# Patient Record
Sex: Male | Born: 1935 | Race: White | Hispanic: No | Marital: Married | Smoking: Former smoker
Health system: Southern US, Community
[De-identification: ages and names within clinical notes are randomized; demographics above are authoritative.]

## PROBLEM LIST (undated history)

## (undated) DIAGNOSIS — G459 Transient cerebral ischemic attack, unspecified: Secondary | ICD-10-CM

## (undated) DIAGNOSIS — E785 Hyperlipidemia, unspecified: Secondary | ICD-10-CM

## (undated) DIAGNOSIS — Z720 Tobacco use: Secondary | ICD-10-CM

## (undated) DIAGNOSIS — I639 Cerebral infarction, unspecified: Secondary | ICD-10-CM

## (undated) DIAGNOSIS — N182 Chronic kidney disease, stage 2 (mild): Secondary | ICD-10-CM

## (undated) DIAGNOSIS — K635 Polyp of colon: Secondary | ICD-10-CM

## (undated) DIAGNOSIS — Z9289 Personal history of other medical treatment: Secondary | ICD-10-CM

## (undated) DIAGNOSIS — J189 Pneumonia, unspecified organism: Secondary | ICD-10-CM

## (undated) DIAGNOSIS — K219 Gastro-esophageal reflux disease without esophagitis: Secondary | ICD-10-CM

## (undated) DIAGNOSIS — I10 Essential (primary) hypertension: Secondary | ICD-10-CM

## (undated) DIAGNOSIS — E538 Deficiency of other specified B group vitamins: Secondary | ICD-10-CM

## (undated) DIAGNOSIS — K649 Unspecified hemorrhoids: Secondary | ICD-10-CM

## (undated) DIAGNOSIS — K222 Esophageal obstruction: Secondary | ICD-10-CM

## (undated) DIAGNOSIS — H3530 Unspecified macular degeneration: Secondary | ICD-10-CM

## (undated) DIAGNOSIS — R911 Solitary pulmonary nodule: Secondary | ICD-10-CM

## (undated) HISTORY — DX: Personal history of other medical treatment: Z92.89

## (undated) HISTORY — DX: Deficiency of other specified B group vitamins: E53.8

## (undated) HISTORY — DX: Unspecified macular degeneration: H35.30

## (undated) HISTORY — DX: Transient cerebral ischemic attack, unspecified: G45.9

## (undated) HISTORY — DX: Esophageal obstruction: K22.2

## (undated) HISTORY — DX: Tobacco use: Z72.0

## (undated) HISTORY — DX: Gastro-esophageal reflux disease without esophagitis: K21.9

## (undated) HISTORY — DX: Pneumonia, unspecified organism: J18.9

## (undated) HISTORY — DX: Hyperlipidemia, unspecified: E78.5

## (undated) HISTORY — DX: Unspecified hemorrhoids: K64.9

## (undated) HISTORY — DX: Essential (primary) hypertension: I10

## (undated) HISTORY — DX: Polyp of colon: K63.5

## (undated) HISTORY — DX: Solitary pulmonary nodule: R91.1

---

## 1991-09-30 DIAGNOSIS — Z9289 Personal history of other medical treatment: Secondary | ICD-10-CM

## 1991-09-30 HISTORY — DX: Personal history of other medical treatment: Z92.89

## 1993-08-29 HISTORY — PX: CYSTECTOMY: SUR359

## 1997-07-10 ENCOUNTER — Encounter: Payer: Self-pay | Admitting: Family Medicine

## 1997-07-10 LAB — CONVERTED CEMR LAB: PSA: 1.6 ng/mL

## 1997-12-13 ENCOUNTER — Inpatient Hospital Stay (HOSPITAL_COMMUNITY): Admission: EM | Admit: 1997-12-13 | Discharge: 1997-12-17 | Payer: Self-pay | Admitting: Emergency Medicine

## 2004-09-02 ENCOUNTER — Ambulatory Visit: Payer: Self-pay | Admitting: Family Medicine

## 2005-06-30 ENCOUNTER — Ambulatory Visit: Payer: Self-pay | Admitting: Family Medicine

## 2005-12-09 ENCOUNTER — Ambulatory Visit: Payer: Self-pay | Admitting: Family Medicine

## 2005-12-23 ENCOUNTER — Ambulatory Visit: Payer: Self-pay | Admitting: Family Medicine

## 2006-04-22 DIAGNOSIS — Z9289 Personal history of other medical treatment: Secondary | ICD-10-CM

## 2006-04-22 HISTORY — DX: Personal history of other medical treatment: Z92.89

## 2006-05-16 ENCOUNTER — Encounter: Admission: RE | Admit: 2006-05-16 | Discharge: 2006-05-16 | Payer: Self-pay | Admitting: Interventional Radiology

## 2006-06-06 ENCOUNTER — Ambulatory Visit: Payer: Self-pay | Admitting: Family Medicine

## 2006-06-08 ENCOUNTER — Encounter: Admission: RE | Admit: 2006-06-08 | Discharge: 2006-06-08 | Payer: Self-pay | Admitting: Orthopedic Surgery

## 2006-06-30 ENCOUNTER — Ambulatory Visit: Payer: Self-pay | Admitting: Family Medicine

## 2006-12-06 ENCOUNTER — Ambulatory Visit: Payer: Self-pay | Admitting: Family Medicine

## 2007-01-05 ENCOUNTER — Telehealth: Payer: Self-pay | Admitting: Family Medicine

## 2008-05-28 ENCOUNTER — Ambulatory Visit: Payer: Self-pay | Admitting: Family Medicine

## 2008-07-04 ENCOUNTER — Ambulatory Visit: Payer: Self-pay | Admitting: Internal Medicine

## 2008-07-11 ENCOUNTER — Encounter: Payer: Self-pay | Admitting: Family Medicine

## 2008-07-14 DIAGNOSIS — R0989 Other specified symptoms and signs involving the circulatory and respiratory systems: Secondary | ICD-10-CM

## 2008-07-14 DIAGNOSIS — I10 Essential (primary) hypertension: Secondary | ICD-10-CM | POA: Insufficient documentation

## 2008-07-14 DIAGNOSIS — E7800 Pure hypercholesterolemia, unspecified: Secondary | ICD-10-CM

## 2008-08-29 DIAGNOSIS — J189 Pneumonia, unspecified organism: Secondary | ICD-10-CM

## 2008-08-29 HISTORY — DX: Pneumonia, unspecified organism: J18.9

## 2009-02-08 ENCOUNTER — Ambulatory Visit: Payer: Self-pay | Admitting: Internal Medicine

## 2009-02-08 ENCOUNTER — Encounter: Payer: Self-pay | Admitting: Internal Medicine

## 2009-02-08 ENCOUNTER — Inpatient Hospital Stay (HOSPITAL_COMMUNITY): Admission: EM | Admit: 2009-02-08 | Discharge: 2009-02-11 | Payer: Self-pay | Admitting: Emergency Medicine

## 2009-02-08 DIAGNOSIS — J189 Pneumonia, unspecified organism: Secondary | ICD-10-CM

## 2009-02-11 ENCOUNTER — Telehealth: Payer: Self-pay | Admitting: Family Medicine

## 2009-02-12 ENCOUNTER — Ambulatory Visit: Payer: Self-pay | Admitting: Family Medicine

## 2009-02-25 ENCOUNTER — Ambulatory Visit: Payer: Self-pay | Admitting: Family Medicine

## 2009-06-18 ENCOUNTER — Ambulatory Visit: Payer: Self-pay | Admitting: Family Medicine

## 2010-04-01 ENCOUNTER — Encounter (INDEPENDENT_AMBULATORY_CARE_PROVIDER_SITE_OTHER): Payer: Self-pay | Admitting: *Deleted

## 2010-04-26 ENCOUNTER — Ambulatory Visit: Payer: Self-pay | Admitting: Family Medicine

## 2010-04-26 DIAGNOSIS — F17200 Nicotine dependence, unspecified, uncomplicated: Secondary | ICD-10-CM | POA: Insufficient documentation

## 2010-04-27 LAB — CONVERTED CEMR LAB
Bilirubin, Direct: 0.1 mg/dL (ref 0.0–0.3)
Calcium: 9.5 mg/dL (ref 8.4–10.5)
Creatinine, Ser: 1 mg/dL (ref 0.4–1.5)
HDL: 44.7 mg/dL (ref >39.00)
LDL Cholesterol: 111 mg/dL — ABNORMAL HIGH (ref 0–99)
Total Bilirubin: 0.4 mg/dL (ref 0.3–1.2)
Total CHOL/HDL Ratio: 4
Triglycerides: 86 mg/dL (ref 0.0–149.0)
VLDL: 17.2 mg/dL (ref 0.0–40.0)

## 2010-07-07 ENCOUNTER — Ambulatory Visit: Payer: Self-pay | Admitting: Family Medicine

## 2010-07-07 DIAGNOSIS — J069 Acute upper respiratory infection, unspecified: Secondary | ICD-10-CM | POA: Insufficient documentation

## 2010-09-28 NOTE — Assessment & Plan Note (Signed)
Summary: SORE THROAT/DLO   Vital Signs:  Patient profile:   77 year old male Height:      65.75 inches Weight:      151.75 pounds BMI:     24.77 Temp:     98.4 degrees F oral Pulse rate:   88 / minute Pulse rhythm:   regular BP sitting:   130 / 70  (left arm) Cuff size:   regular  Vitals Entered By: Delilah Shan CMA Duncan Dull) (July 07, 2010 2:53 PM) CC: ST, cough   History of Present Illness: Skin CA on face to be taked off by Dr. Danella Deis in 12/11.    Cough and ST.  "I felt bad last night."  Cough started yesterday/last night.  H/o PNA 2010.  No ear pain.  + rhinorrhea.  All symptoms started yesterday.  Occ sputum, white.  No fevers documented, but had some chills.  SABA not used anytime recently.  Voice change noted by patient.    We'll resend for [employer] today.   Still with occ feeling of food sticking in throat.  He declines GI referral now but is considering.  Not having consistent symptoms, only episodic.   Allergies: No Known Drug Allergies  Review of Systems       See HPI.  Otherwise negative.    Physical Exam  General:  GEN: nad, alert and oriented HEENT: mucous membranes moist, TM w/o erythema, nasal epithelium injected, OP with cobblestoning, bandaid on nose where skin CA is to be removed. NECK: supple w/o LA CV: rrr. PULM: ctab, no inc wob   Impression & Recommendations:  Problem # 1:  URI (ICD-465.9) RST neg and lungs clear to auscultation bilaterally.  Likely benign viral process prevalent in community recently.  Nontoxic and okay for outpatient follow up.  Pt to call back as desired about GI follow up.  He agreed to consider.  His updated medication list for this problem includes:    Mucinex Dm 30-600 Mg Xr12h-tab (Dextromethorphan-guaifenesin) .Marland Kitchen... As needed cough  Complete Medication List: 1)  Lisinopril 40 Mg Tabs (Lisinopril) .... Take 1/2 by mouth daily 2)  Hydrochlorothiazide 25 Mg Tabs (Hydrochlorothiazide) .... Take one by mouth daily 3)   Simvastatin 20 Mg Tabs (Simvastatin) .... 1/2 tab by mouth at night 4)  Proventil Hfa 108 (90 Base) Mcg/act Aers (Albuterol sulfate) .... Take 2 puffs every 4 hours as needed for wheezing 5)  Amlodipine Besylate 10 Mg Tabs (Amlodipine besylate) .... Take one by mouth daily 6)  Mucinex Dm 30-600 Mg Xr12h-tab (Dextromethorphan-guaifenesin) .... As needed cough  Patient Instructions: 1)  Get plenty of rest, drink lots of clear liquids, and use Tylenol  for fever and comfort. I would use the inhaler if needed for cough. This should gradually get better.  If you have other concerns, let us know.    Orders Added: 1)  Est. Patient Level III [91478]    Current Allergies (reviewed today): No known allergies   Laboratory Results  Date/Time Received: July 07, 2010 3:07 PM   Other Tests  Rapid Strep: negative

## 2010-09-28 NOTE — Letter (Signed)
Summary: Nadara Eaton letter  [place] [facility]  [address] [place], [place] [address]   Phone: [phone]  Fax: [phone]       04/01/2010 MRN: [id number]  [facility] [address]  [place], [place]  [address]  Dear Mr. GEARHART,  [facility] [place], and [facility] announce the retirement of Arta Silence, M.D., from full-time practice at the [facility] office effective February 25, 2010 and his plans of returning part-time.  It is important to Dr. Hetty Ely and to our practice that you understand that [facility] has seven physicians in our office for your health care needs.  We will continue to offer the same exceptional care that you have today.    Dr. Hetty Ely has spoken to many of you about his plans for retirement and returning part-time in the fall.   We will continue to work with you through the transition to schedule appointments for you in the office and meet the high standards that [facility] is committed to.   Again, it is with great pleasure that we share the news that Dr. Hetty Ely will return to [facility] at [facility] in October of 2011 with a reduced schedule.    If you have any questions, or would like to request an appointment with one of our physicians, please call us at [phone] and press the option for Scheduling an appointment.  We take pleasure in providing you with excellent patient care and look forward to seeing you at your next office visit.  Our [facility] Physicians are:  Tillman Abide, M.D. Laurita Quint, M.D. Roxy Manns, M.D. Kerby Nora, M.D. Hannah Beat, M.D. Ruthe Mannan, M.D. We proudly welcomed Raechel Ache, M.D. and Eustaquio Boyden, M.D. to the practice in July/August 2011.  Sincerely,  [facility]
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
## 2010-10-28 ENCOUNTER — Ambulatory Visit (INDEPENDENT_AMBULATORY_CARE_PROVIDER_SITE_OTHER)
Admission: RE | Admit: 2010-10-28 | Discharge: 2010-10-28 | Disposition: A | Discharge status: Home / Self Care | Payer: MEDICARE | Source: Ambulatory Visit | Attending: Family Medicine | Admitting: Family Medicine

## 2010-10-28 ENCOUNTER — Ambulatory Visit (INDEPENDENT_AMBULATORY_CARE_PROVIDER_SITE_OTHER): Payer: MEDICARE | Admitting: Family Medicine

## 2010-10-28 ENCOUNTER — Encounter: Payer: Self-pay | Admitting: Family Medicine

## 2010-10-28 ENCOUNTER — Other Ambulatory Visit: Payer: Self-pay | Admitting: Family Medicine

## 2010-10-28 DIAGNOSIS — E7800 Pure hypercholesterolemia, unspecified: Secondary | ICD-10-CM

## 2010-10-28 DIAGNOSIS — J984 Other disorders of lung: Secondary | ICD-10-CM

## 2010-10-28 DIAGNOSIS — I10 Essential (primary) hypertension: Secondary | ICD-10-CM

## 2010-10-28 DIAGNOSIS — R1319 Other dysphagia: Secondary | ICD-10-CM

## 2010-10-28 DIAGNOSIS — N39498 Other specified urinary incontinence: Secondary | ICD-10-CM | POA: Insufficient documentation

## 2010-10-28 DIAGNOSIS — J069 Acute upper respiratory infection, unspecified: Secondary | ICD-10-CM

## 2010-10-28 DIAGNOSIS — K224 Dyskinesia of esophagus: Secondary | ICD-10-CM | POA: Insufficient documentation
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
## 2010-11-08 ENCOUNTER — Telehealth: Payer: Self-pay | Admitting: Family Medicine

## 2010-11-10 ENCOUNTER — Encounter: Payer: Self-pay | Admitting: Family Medicine

## 2010-11-16 NOTE — Miscellaneous (Signed)
  Clinical Lists Changes  Observations: Added new observation of ZOSTAVAX: Zostavax (11/10/2010 17:11)      Immunization History:  Zostavax History:    Zostavax # 1:  zostavax (11/10/2010)

## 2010-11-16 NOTE — Progress Notes (Signed)
Summary: pt wants zostavax  Phone Note From Pharmacy   Caller: [facility]* Summary of Call: Pt would like to get zostavax, pharmacy is asking for an order to be sent to them.  Pt's insurance will pay for this.            Lowella Petties CMA, AAMA  November 08, 2010 10:49 AM   Follow-up for Phone Call        please send order for zostavax, inject one dose.  thanks. Crawford Givens MD  November 08, 2010 1:49 PM  Done.   Lugene Fuquay CMA Eoin Willden Dull)  November 08, 2010 3:05 PM

## 2010-11-22 ENCOUNTER — Encounter: Payer: Self-pay | Admitting: Family Medicine

## 2010-12-06 LAB — CBC
HCT: 35.2 % — ABNORMAL LOW (ref 39.0–52.0)
HCT: 44.5 % (ref 39.0–52.0)
Hemoglobin: 11.9 g/dL — ABNORMAL LOW (ref 13.0–17.0)
MCHC: 33.3 g/dL (ref 30.0–36.0)
MCV: 83.4 fL (ref 78.0–100.0)
MCV: 83.4 fL (ref 78.0–100.0)
Platelets: 203 10*3/uL (ref 150–400)
Platelets: 245 10*3/uL (ref 150–400)
Platelets: 270 10*3/uL (ref 150–400)
RDW: 14.6 % (ref 11.5–15.5)
RDW: 14.5 % (ref 11.5–15.5)
RDW: 14.7 % (ref 11.5–15.5)
WBC: 14.5 10*3/uL — ABNORMAL HIGH (ref 4.0–10.5)
WBC: 22.7 10*3/uL — ABNORMAL HIGH (ref 4.0–10.5)

## 2010-12-06 LAB — DIFFERENTIAL
Basophils Absolute: 0 10*3/uL (ref 0.0–0.1)
Basophils Relative: 0 % (ref 0–1)
Basophils Relative: 0 % (ref 0–1)
Eosinophils Absolute: 0 10*3/uL (ref 0.0–0.7)
Eosinophils Relative: 0 % (ref 0–5)
Lymphs Abs: 0.7 10*3/uL (ref 0.7–4.0)
Monocytes Absolute: 0.4 10*3/uL (ref 0.1–1.0)
Monocytes Relative: 2 % — ABNORMAL LOW (ref 3–12)
Monocytes Relative: 2 % — ABNORMAL LOW (ref 3–12)
Neutro Abs: 13.4 10*3/uL — ABNORMAL HIGH (ref 1.7–7.7)
Neutrophils Relative %: 94 % — ABNORMAL HIGH (ref 43–77)

## 2010-12-06 LAB — EXPECTORATED SPUTUM ASSESSMENT W GRAM STAIN, RFLX TO RESP C

## 2010-12-06 LAB — BASIC METABOLIC PANEL
BUN: 20 mg/dL (ref 6–23)
BUN: 26 mg/dL — ABNORMAL HIGH (ref 6–23)
Chloride: 93 mEq/L — ABNORMAL LOW (ref 96–112)
Chloride: 94 mEq/L — ABNORMAL LOW (ref 96–112)
Glucose, Bld: 131 mg/dL — ABNORMAL HIGH (ref 70–99)
Glucose, Bld: 95 mg/dL (ref 70–99)
Potassium: 3.6 mEq/L (ref 3.5–5.1)
Potassium: 4.3 mEq/L (ref 3.5–5.1)
Sodium: 130 mEq/L — ABNORMAL LOW (ref 135–145)

## 2010-12-06 LAB — POCT CARDIAC MARKERS
CKMB, poc: <1 ng/mL — ABNORMAL LOW (ref 1.0–8.0)
Myoglobin, poc: 71.6 ng/mL (ref 12–200)
Troponin i, poc: <0.05 ng/mL (ref 0.00–0.09)

## 2010-12-06 LAB — CULTURE, BLOOD (ROUTINE X 2)

## 2010-12-06 LAB — POCT I-STAT 3, ART BLOOD GAS (G3+)
Bicarbonate: 28.7 mEq/L — ABNORMAL HIGH (ref 20.0–24.0)
pCO2 arterial: 47.2 mmHg — ABNORMAL HIGH (ref 35.0–45.0)
pH, Arterial: 7.392 (ref 7.350–7.450)
pO2, Arterial: 167 mmHg — ABNORMAL HIGH (ref 80.0–100.0)

## 2010-12-06 LAB — BRAIN NATRIURETIC PEPTIDE: Pro B Natriuretic peptide (BNP): 178 pg/mL — ABNORMAL HIGH (ref 0.0–100.0)

## 2010-12-06 LAB — LEGIONELLA ANTIGEN, URINE

## 2010-12-06 LAB — CULTURE, RESPIRATORY W GRAM STAIN

## 2010-12-06 LAB — GLUCOSE, CAPILLARY: Glucose-Capillary: 100 mg/dL — ABNORMAL HIGH (ref 70–99)

## 2011-01-11 NOTE — Discharge Summary (Signed)
NAMEEVANN, ERAZO NO.:  [id number]   MEDICAL RECORD NO.:  [id number]          PATIENT TYPE:  INP   LOCATION:  5126                         FACILITY:  [facility]   PHYSICIAN:  Willow Ora, MD           DATE OF BIRTH:  12-14-1924   DATE OF ADMISSION:  02/08/2009  DATE OF DISCHARGE:  02/11/2009                               DISCHARGE SUMMARY   PRIMARY CARE DOCTOR:  Arta Silence, MD   ADMITTING DIAGNOSIS:  Right lower lobe pneumonia.   DISCHARGE DIAGNOSES:  1. Bilateral pneumonia.  2. Chronic obstructive pulmonary disease.  3. A 4-mm left lower lobe nodule.  4. Hypertension.  5. High cholesterol.   BRIEF HISTORY AND PHYSICAL:  Mr. Babler is a pleasant 77 year old  gentleman who presented to the ER with several days history of feeling  weak and ill.  On the day of admission, he had a sudden onset of rigors  and sweats, difficulty with shortness of breath.  At the emergency room,  he was found to have an infiltrate and elevated white count and he was  admitted for further care.   PHYSICAL EXAMINATION:  VITAL SIGNS:  His O2 sat 190% on 2 liters.  He  was afebrile.  His respiratory rate was 25 per minute, pulse 107 per  minute.  LUNGS:  Normal respiratory effort.  He had crackles at the right base  and diminished breath sounds in general.  CARDIOVASCULAR:  Tachycardic without murmur.  ABDOMEN:  Soft, nontender with good bowel sounds.  EXTREMITY:  No clubbing, cyanosis, or edema.   LABORATORY AND X-RAYS:  Initial CBC showed white count of 14.5 with a  hemoglobin of 14.9 and platelets of 270.  At some point during this  admission, the white count was 22.7 but at time of discharge, it dropped  back to 12.5 with a hemoglobin of 12.7 and platelets of 241.  Creatinine  1.1, potassium 4.3, sodium has been a little low to 130, blood sugars  normal, calcium normal.  Sputum culture show few Gram-positive cocci and  Gram-positive rods, but the final culture is pending  at the time of this  discharge.  Strep pneumonia in the urine is pending.  Legionella in the  urine is negative.  Blood cultures are negative so far.  BNP is slightly  elevated to 178.  CT of the chest show right greater than left patchy  lower lobe airspace disease suspicious for pneumonia and COPD type of  changes.  There is also a 4-mm left lower lobe nodule in the left lower  lobe.   HOSPITAL COURSE:  The patient was admitted to the hospital and started  on IV antibiotics, IV fluids, and frequent nebulizations.  His hospital  stay was uneventful.  At the time of discharge, he was feeling better.  Appetite was normal.  His O2 sat on room air varies from 95 to 94.  At  this point, he feels ready to go home and consequently he will be  discharged with the following instructions;  1. Lisinopril 40 mg previously taking one  a day, advised the patient      to take half a day because his blood pressure on the hospital has      been in the low side.  2. Continue with hydrochlorothiazide 25 mg one a day.  3. Continue with simvastatin 20 mg half a day, although the patient      reports poor compliance.  4. Albuterol 2 puffs every 4 hours as needed for wheezing, a      prescription is issue.  5. Zithromax 250 one a day for 2 days.  6. Cefuroxime 500 mg one p.o. b.i.d. for 1 week.  7. Mucinex DM or Robitussin DM over-the-counter as needed for cough.  8. He is advised to see his physician next week and call him if there      is any worsening of his symptoms or high fever.  9. Continue with amlodipine 10 mg once a day.  10.The patient has left lower lobe lung nodule per CT.  I'm not sure      if this a new finding, but nevertheless that needs to be follow up      by his primary care doctor.      Willow Ora, MD  Electronically Signed     JP/MEDQ  D:  02/11/2009  T:  02/11/2009  Job:  [id number]   cc:   Arta Silence, MD

## 2011-03-15 ENCOUNTER — Emergency Department (HOSPITAL_COMMUNITY): Payer: Medicare Other

## 2011-03-15 ENCOUNTER — Inpatient Hospital Stay (HOSPITAL_COMMUNITY)
Admission: EM | Admit: 2011-03-15 | Discharge: 2011-03-17 | DRG: 091 | Disposition: A | Discharge status: Home Health | Payer: Medicare Other | Attending: Family Medicine | Admitting: Family Medicine

## 2011-03-15 ENCOUNTER — Inpatient Hospital Stay (INDEPENDENT_AMBULATORY_CARE_PROVIDER_SITE_OTHER)
Admission: RE | Admit: 2011-03-15 | Discharge: 2011-03-15 | Disposition: A | Discharge status: Home / Self Care | Payer: MEDICARE | Source: Ambulatory Visit | Attending: Family Medicine | Admitting: Family Medicine

## 2011-03-15 ENCOUNTER — Telehealth: Payer: Self-pay | Admitting: *Deleted

## 2011-03-15 DIAGNOSIS — E785 Hyperlipidemia, unspecified: Secondary | ICD-10-CM | POA: Diagnosis present

## 2011-03-15 DIAGNOSIS — Z79899 Other long term (current) drug therapy: Secondary | ICD-10-CM

## 2011-03-15 DIAGNOSIS — E538 Deficiency of other specified B group vitamins: Secondary | ICD-10-CM | POA: Diagnosis present

## 2011-03-15 DIAGNOSIS — J4489 Other specified chronic obstructive pulmonary disease: Secondary | ICD-10-CM | POA: Diagnosis present

## 2011-03-15 DIAGNOSIS — Z7982 Long term (current) use of aspirin: Secondary | ICD-10-CM

## 2011-03-15 DIAGNOSIS — I059 Rheumatic mitral valve disease, unspecified: Secondary | ICD-10-CM | POA: Diagnosis present

## 2011-03-15 DIAGNOSIS — Z87891 Personal history of nicotine dependence: Secondary | ICD-10-CM

## 2011-03-15 DIAGNOSIS — N400 Enlarged prostate without lower urinary tract symptoms: Secondary | ICD-10-CM | POA: Diagnosis present

## 2011-03-15 DIAGNOSIS — R279 Unspecified lack of coordination: Principal | ICD-10-CM | POA: Diagnosis present

## 2011-03-15 DIAGNOSIS — I10 Essential (primary) hypertension: Secondary | ICD-10-CM | POA: Diagnosis present

## 2011-03-15 DIAGNOSIS — G458 Other transient cerebral ischemic attacks and related syndromes: Secondary | ICD-10-CM | POA: Diagnosis present

## 2011-03-15 DIAGNOSIS — I6350 Cerebral infarction due to unspecified occlusion or stenosis of unspecified cerebral artery: Secondary | ICD-10-CM | POA: Diagnosis present

## 2011-03-15 DIAGNOSIS — J449 Chronic obstructive pulmonary disease, unspecified: Secondary | ICD-10-CM | POA: Diagnosis present

## 2011-03-15 LAB — DIFFERENTIAL
Basophils Relative: 0 % (ref 0–1)
Eosinophils Absolute: 0.1 10*3/uL (ref 0.0–0.7)
Eosinophils Relative: 1 % (ref 0–5)
Lymphs Abs: 2.5 10*3/uL (ref 0.7–4.0)
Monocytes Absolute: 0.8 10*3/uL (ref 0.1–1.0)
Monocytes Relative: 7 % (ref 3–12)
Neutrophils Relative %: 69 % (ref 43–77)

## 2011-03-15 LAB — CK TOTAL AND CKMB (NOT AT ARMC): Total CK: 87 U/L (ref 7–232)

## 2011-03-15 LAB — PROTIME-INR: Prothrombin Time: 12.7 seconds (ref 11.6–15.2)

## 2011-03-15 LAB — POCT I-STAT, CHEM 8
BUN: 23 mg/dL (ref 6–23)
BUN: 18 mg/dL (ref 6–23)
Calcium, Ion: 1.15 mmol/L (ref 1.12–1.32)
Calcium, Ion: 1.12 mmol/L (ref 1.12–1.32)
Chloride: 99 mEq/L (ref 96–112)
Glucose, Bld: 95 mg/dL (ref 70–99)
Glucose, Bld: 96 mg/dL (ref 70–99)
TCO2: 30 mmol/L (ref 0–100)
TCO2: 27 mmol/L (ref 0–100)

## 2011-03-15 LAB — CBC
MCH: 27.4 pg (ref 26.0–34.0)
MCHC: 33.2 g/dL (ref 30.0–36.0)
MCV: 82.6 fL (ref 78.0–100.0)
Platelets: 234 10*3/uL (ref 150–400)
RBC: 5.22 MIL/uL (ref 4.22–5.81)
RDW: 14.2 % (ref 11.5–15.5)

## 2011-03-15 LAB — COMPREHENSIVE METABOLIC PANEL
ALT: 14 U/L (ref 0–53)
AST: 17 U/L (ref 0–37)
Alkaline Phosphatase: 94 U/L (ref 39–117)
CO2: 30 mEq/L (ref 19–32)
Calcium: 9.5 mg/dL (ref 8.4–10.5)
Chloride: 97 mEq/L (ref 96–112)
GFR calc Af Amer: >60 mL/min (ref >60)
GFR calc non Af Amer: >60 mL/min (ref >60)
Glucose, Bld: 93 mg/dL (ref 70–99)
Potassium: 3.9 mEq/L (ref 3.5–5.1)
Sodium: 135 mEq/L (ref 135–145)

## 2011-03-15 LAB — GLUCOSE, CAPILLARY: Glucose-Capillary: 103 mg/dL — ABNORMAL HIGH (ref 70–99)

## 2011-03-15 LAB — TROPONIN I: Troponin I: <0.3 ng/mL (ref <0.30)

## 2011-03-15 NOTE — Telephone Encounter (Signed)
Wife says that patient is falling and running into walls, feeling very dizzy and light headed. I advised wife to take him to the ER. She agreed and said that she will be taking him to [facility].

## 2011-03-15 NOTE — Telephone Encounter (Signed)
Noted  

## 2011-03-16 ENCOUNTER — Observation Stay (HOSPITAL_COMMUNITY): Payer: Medicare Other

## 2011-03-16 DIAGNOSIS — I517 Cardiomegaly: Secondary | ICD-10-CM

## 2011-03-16 LAB — CBC
Hemoglobin: 13 g/dL (ref 13.0–17.0)
MCH: 27.3 pg (ref 26.0–34.0)
MCHC: 32.7 g/dL (ref 30.0–36.0)
RDW: 14.3 % (ref 11.5–15.5)

## 2011-03-16 LAB — COMPREHENSIVE METABOLIC PANEL
ALT: 12 U/L (ref 0–53)
Albumin: 3.2 g/dL — ABNORMAL LOW (ref 3.5–5.2)
Alkaline Phosphatase: 82 U/L (ref 39–117)
Potassium: 4.6 mEq/L (ref 3.5–5.1)
Sodium: 137 mEq/L (ref 135–145)
Total Protein: 6.5 g/dL (ref 6.0–8.3)

## 2011-03-16 LAB — GLUCOSE, CAPILLARY
Glucose-Capillary: 90 mg/dL (ref 70–99)
Glucose-Capillary: 115 mg/dL — ABNORMAL HIGH (ref 70–99)
Glucose-Capillary: 112 mg/dL — ABNORMAL HIGH (ref 70–99)

## 2011-03-16 LAB — CARDIAC PANEL(CRET KIN+CKTOT+MB+TROPI)
CK, MB: 3.7 ng/mL (ref 0.3–4.0)
Troponin I: <0.3 ng/mL (ref <0.30)

## 2011-03-16 LAB — PROTIME-INR: Prothrombin Time: 12.6 seconds (ref 11.6–15.2)

## 2011-03-16 LAB — LIPID PANEL
Total CHOL/HDL Ratio: 3.1 RATIO
VLDL: 15 mg/dL (ref 0–40)

## 2011-03-16 MED ORDER — GADOBENATE DIMEGLUMINE 529 MG/ML IV SOLN
15.0000 mL | Freq: Once | INTRAVENOUS | Status: AC
  Administered 2011-03-16: 15 mL via INTRAVENOUS

## 2011-03-16 NOTE — Consult Note (Signed)
NAMEMAYKEL, REITTER NO.:  [id number]  MEDICAL RECORD NO.:  [id number]  LOCATION:  3034                         FACILITY:  [facility]  PHYSICIAN:  Marlan Palau, M.D.  DATE OF BIRTH:  1925-02-21  DATE OF CONSULTATION:  03/15/2011 DATE OF DISCHARGE:                                CONSULTATION   HISTORY OF PRESENT ILLNESS:  [name] is an 77 year old right- handed white male born 1925-06-18, with a history of hypertension and dyslipidemia.  This patient comes to the hospital today for an evaluation of difficulty with walking.  The patient went and took a nap at an 11 a.m. today, feeling fine.  The patient awakened at 3 o'clock p.m. and tried to get out of bed.  The patient fell to the floor, finding that he was unable to walk well.  The patient initially went to urgent medical care, but was sent to the emergency room.  The patient has not reported any true vertigo, nausea, vomiting, numbness, or weakness on the arms or legs, visual disturbances, headache, double vision.  He demonstrated in the emergency room, the patient has a tendency to veer to the right.  The patient has undergone an MRI study of the brain which does not clearly show evidence of an acute stroke. The patient has evidence of small-vessel disease by MRI, however.  The patient is being seen by Neurology for further evaluation.  Walking may be getting somewhat better as the day goes on.  NIH stroke scale score is 1.  The patient is not a TPA candidate secondary to minimal deficit and duration of symptoms.  PAST MEDICAL HISTORY:  Significant for: 1. COPD. 2. Hypertension. 3. Dyslipidemia. 4. History of vertigo in the past. 5. Small-vessel disease by MRI. 6. Pilonidal cyst resection. 7. History of pneumonia in 2010.  MEDICATIONS: 1. Norvasc 10 mg daily. 2. Hydrochlorothiazide 25 mg daily. 3. Lisinopril 20 mg daily. 4. Simvastatin 20 mg daily.  The patient has no known  allergies.  Quit smoking in the past.  Does not drink alcohol.  SOCIAL HISTORY:  The patient is married, lives in a [place] area.  Has 2 children, 1 daughter committed suicide.  The patient works as a Electrical engineer.  FAMILY MEDICAL HISTORY:  Mother died of unknown etiology, father died with heart attack.  The patient has 1 sister who is "old" but otherwise no specific medical issues noted.  The patient has no brothers.  REVIEW OF SYSTEMS:  Notable for no fevers or chills.  The patient denies headache, hearing changes, ringing in the ears, or ear pain.  The patient denies any slurred speech, difficulty swallowing, difficulty with neck pain, shortness of breath, chest pains, abdominal pain, nausea, vomiting, or troubles controlling the bowels or bladder.  The patient denies any blackout episodes, numbness or weakness on the face, arms, or legs.  PHYSICAL EXAMINATION:  VITAL SIGNS:  Blood pressure is 177/67, heart rate 64, respiratory rate 20, temperature afebrile. GENERAL:  This patient is a fairly well-developed elderly white male who is alert and cooperative at the time of examination. HEENT:  Head is atraumatic.  Eyes, pupils are round and reactive to  light. NECK:  Supple.  No carotid bruits noted. RESPIRATORY:  Clear. CARDIOVASCULAR:  Revealed regular rate and rhythm.  No obvious murmurs or rubs noted. EXTREMITIES:  Without significant edema. NEUROLOGIC:  Cranial nerves as above.  Facial symmetry is present.  The patient has good sensation of face to pinprick, soft touch bilaterally. Has good strength of facial muscles and the muscles of head turn and shoulder shrug bilaterally.  Speech is well enunciated, not aphasic. Extraocular movements again are full.  No end-gaze nystagmus is seen. The patient has good strength in all 4.  Good symmetric motor tone is noted throughout.  Sensory testing is intact to pinprick, soft touch, vibratory sensation  throughout.  The patient has good finger-nose- finger, heel-to-shin bilaterally.  Gait was tested, slightly wide-based. The patient tends to fall backwards.  Deep tendon reflexes symmetric, normal toes downgoing bilaterally.  Again, no evidence of extinction is noted.  LABORATORY VALUES:  Notable for white count of 11.2, hemoglobin of 14.3, hematocrit of 41.1, MCV of 82.6, platelets of 234.  INR of 0.93.  Sodium 135, potassium 3.9, chloride of 97, CO2 of 30, glucose of 93, BUN of 18, creatinine 0.87, total bili 0.5, alk phosphatase 94, SGOT of 17, SGPT of 14, total protein 7.4, albumin of 3.8, calcium of 9.5, CK of 87, MB fraction of 3.7, troponin I less than 0.3.  IMPRESSION: 1. Onset of gait instability, sudden onset. 2. Small vessel disease by MRI. 3. Hypertension.  This patient does have some risk factors for stroke.  The patient has had a sudden onset of gait disturbance, unassociated with true vertigo or nausea.  Even though the MRI scan of the brain does not show an infarct, the patient likely has had a small vessel event.  The patient will be placed on aspirin.  We will complete the stroke workup to include MRA of the head and neck, 2D echocardiogram.  The patient will undergo physical therapy evaluation.  I will check some blood work to include TSH, thiamine level, and B12 level.     Marlan Palau, M.D.     CKW/MEDQ  D:  03/16/2011  T:  03/16/2011  Job:  [id number]  cc:   [facility]  Electronically Signed by Thana Farr M.D. on 03/16/2011 08:24:26 AM

## 2011-03-17 LAB — GLUCOSE, CAPILLARY
Glucose-Capillary: 92 mg/dL (ref 70–99)
Glucose-Capillary: 109 mg/dL — ABNORMAL HIGH (ref 70–99)

## 2011-03-17 LAB — CBC
HCT: 40.9 % (ref 39.0–52.0)
MCHC: 33.5 g/dL (ref 30.0–36.0)
RDW: 14.3 % (ref 11.5–15.5)

## 2011-03-17 LAB — COMPREHENSIVE METABOLIC PANEL
Albumin: 3.1 g/dL — ABNORMAL LOW (ref 3.5–5.2)
Alkaline Phosphatase: 91 U/L (ref 39–117)
BUN: 21 mg/dL (ref 6–23)
Calcium: 9 mg/dL (ref 8.4–10.5)
Potassium: 4.1 mEq/L (ref 3.5–5.1)
Sodium: 135 mEq/L (ref 135–145)
Total Protein: 6.5 g/dL (ref 6.0–8.3)

## 2011-03-17 NOTE — Discharge Summary (Signed)
[name], [name] NO.:  [id number]  MEDICAL RECORD NO.:  [id number]  LOCATION:  3034                         FACILITY:  [facility]  PHYSICIAN:  Standley Dakins, MD   DATE OF BIRTH:  05-09-1925  DATE OF ADMISSION:  03/15/2011 DATE OF DISCHARGE:  03/17/2011                              DISCHARGE SUMMARY   PRIMARY CARE PHYSICIAN:  Dwana Curd. Para March, MD at [facility].  NEUROLOGIST:  Pramod P. Pearlean Brownie, MD  DISCHARGE DIAGNOSES: 1. Transient ischemic attack versus small subacute cerebrovascular     accident. 2. Hypertension. 3. Hyperlipidemia. 4. Ataxia - acute - resolved now. 5. Chronic obstructive pulmonary disease. 6. Benign prostatic hypertrophy. 7. History of vertigo. 8. History of pneumonia. 9. Mobile calcification in the aortic arch near the subclavian take     off, PRIMARY CARE PROVIDER to consider CTA for further evaluation. 10.Severe posterior mitral annular calcification. 11.Mild left ventricular hypertrophy and systolic function in the     range of 55%. 12.Vitamin B12 deficiency.  DISCHARGE MEDICATIONS: 1. Aspirin enteric-coated 325 mg 1 p.o. daily. 2. Amlodipine 10 mg 1 p.o. daily. 3. Hydrochlorothiazide 25 mg 1 tablet p.o. daily. 4. Lisinopril 20 mg 1 tablet p.o. daily. 5. Simvastatin 20 mg 1 p.o. daily.  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  This was concerning for possible CVA.  The patient was admitted and seen by Neurology.  He had an MRI that did not show any acute CVA.  The neurologist felt, however, that he probably did have a small subacute CVA that was not seen on MRI.  The patient improved considerably with treatment.  He worked with Physical Therapy.  They recommended that he continue outpatient physical therapy for balance.  His gait improved considerably and he  was able to ambulate the halls and the ataxia improved as well.    The patient had multiple studies including carotid Doppler studies resulting in no evidence of ICA stenosis.  There was note of vertebral artery flow that was antegrade.  The patient had a 2-D echocardiogram that revealed mobile calcification in the aortic arch near the subclavian take off and they wrote to consider CTA to further evaluate.  I am recommending that the patient see his primary care provider and neurologist to discuss if they want any further studies to be done at this time.  The neurologist recommended that the patient start taking aspirin 325 mg daily and we are going to have him to continue taking that.    I am going to have him follow up closely with his primary care physician in about 1 week.  The echocardiogram also revealed that the left ventricle cavity was normal.  A mild LVH pattern was seen and normal systolic function was seen.  The patient had severe posterior annular calcifications of the mitral valve.  The aorta was normal, not dilated, and non-diseased.  He had mildly calcified leaflets in the aortic valve.  Left atrium was normal in size and no defect or patent foramen ovale was identified in the atrial septum.  Please see full report of echocardiogram for further details.  The patient started asking to go home on March 16, 2011.  We monitored him for an additional night and he did very well, ambulated very well, and given that he was anxious about going home and would not stop asking to go home, I decided to discharge him with close followup with his primary care providers and also with his neurologist.  I would like for him to see his PCP in about a week.  DISCHARGE CONDITION:  Stable and symptoms improved.  Ataxia improved and gait improved.  ACTIVITY:  Ad lib.  Continue home physical therapy, we will arrange prior to discharge.  DIET:  Cardiac diet recommended.  Follow up with  Dr. Crawford Givens in 1 week.  At that time, he can discuss with Dr. Para March if they want to pursue a CT angiogram to further evaluate the mobile calcifications seen on the echocardiogram near the subclavian take off.  Also follow up with neurologist Dr. Pearlean Brownie in 1 month.  He can also discuss with Dr. Pearlean Brownie if they want to pursue the CT angiogram.  SPECIAL INSTRUCTIONS: 1. Return if symptoms recur, worsen or new changes develop. 2. Please discuss with your primary care physician the results of     echocardiogram and other studies done in the hospital. 3. Please follow up with neurologist and discuss results of tests done     in the hospital. 4. Please start taking daily enteric-coated aspirin 325 mg once daily. 5. Please continue home physical therapy and cooperate as recommended. 6. FALL precautions recommended.   7. Please discuss with primary care physician regarding vitamin B12     deficiency. 8. The patient verbalized understanding to all instructions.    The patient was evaluated on the day of discharge.  The patient was  in no distress, cooperative, and pleasant.  The patient was evaluated on March 17, 2011.  The patient was awake, alert, asking to go home,  sitting up in the chair.  He had been ambulating the halls without  difficulty.  The ataxia has resolved.  PHYSICAL EXAMINATION:  VITAL SIGNS:  Reviewed.  Temperature 97.7, pulse 59, respirations 20, blood pressure 137/78, pulse ox 95% on room air. GENERAL:  Well-developed, well-nourished male.  He is awake, alert in no distress, cooperative and pleasant. HEENT:  Normocephalic, atraumatic.  Mucous membranes moist. NECK:  Supple.  No lymphadenopathy or JVD. LUNGS:  Bilateral breath sounds, clear to auscultation. NEUROLOGICAL:  No focal deficits of strength, sensation, or gait.  LABORATORY DATA:  Sodium 135, potassium 4.1, chloride 96, bicarb 32, BUN 21, creatinine 0.93, glucose 93, albumin 3.1, calcium 9.0, AST 17,  ALT 12, white blood cell count 9.9, hemoglobin 13.7, hematocrit 40.9, platelet count 239,000.  Hemoglobin A1c 6.2%, vitamin B12 182, TSH 3.016.  RPR nonreactive.  Total cholesterol 163, triglycerides 75, HDL 53, LDL 95.  IMPRESSION:  The patient was evaluated and determined to be stable for discharge home with close followup with primary care physician and neurologist as above.  I spent 43 minutes preparing this patient's discharge including reviewing medical records, consultation notes, reconciling medications, preparing dictations, and arranging followup and consultation with care coordination team.  NOTE TO PRIMARY CARE PHYSICIAN DR. Para March AND NEUROLOGIST DR. Pearlean Brownie:    PLEASE REVIEW PATIENT'S ECHOCARDIOGRAM AND DECIDE IF YOU WANT TO PURSUE  FURTHER TESTING SUCH AS NOTED AS A CONSIDERATION IN ECHO RESULTS FOR  MOBILE CALCIFICATIONS SEEN NEAR AORTIC ARCH.    Clanford  Laural Benes, MD     CJ/MEDQ  D:  03/17/2011  T:  03/17/2011  Job:  [id number]  cc:   Dwana Curd. Para March, M.D. Pramod P. Pearlean Brownie, MD  Electronically Signed by Standley Dakins  on 03/17/2011 06:45:46 PM

## 2011-03-20 ENCOUNTER — Telehealth: Payer: Self-pay | Admitting: Family Medicine

## 2011-03-20 NOTE — Telephone Encounter (Addendum)
Call pt about hospital follow up- find out when he's going to see Neuro and let me know.  Please send this note back to me.  We need to consider CTA of the aortic arch.  I'll talk to pt about it at follow up appointment.

## 2011-03-21 ENCOUNTER — Encounter: Payer: Self-pay | Admitting: Family Medicine

## 2011-03-21 LAB — VITAMIN B1: Vitamin B1 (Thiamine): 18 nmol/L (ref 9–44)

## 2011-03-21 NOTE — Telephone Encounter (Signed)
Patient has an appointment scheduled with Dr. Para March on 03/22/11.

## 2011-03-21 NOTE — Telephone Encounter (Signed)
Noted  

## 2011-03-22 ENCOUNTER — Encounter: Payer: Self-pay | Admitting: Family Medicine

## 2011-03-22 ENCOUNTER — Ambulatory Visit (INDEPENDENT_AMBULATORY_CARE_PROVIDER_SITE_OTHER): Payer: Medicare Other | Admitting: Family Medicine

## 2011-03-22 VITALS — BP 128/64 | HR 72 | Temp 98.4°F | Wt 149.0 lb

## 2011-03-22 DIAGNOSIS — E538 Deficiency of other specified B group vitamins: Secondary | ICD-10-CM

## 2011-03-22 DIAGNOSIS — G459 Transient cerebral ischemic attack, unspecified: Secondary | ICD-10-CM

## 2011-03-22 MED ORDER — CYANOCOBALAMIN 1000 MCG/ML IJ SOLN
1000.0000 ug | Freq: Once | INTRAMUSCULAR | Status: AC
  Administered 2011-03-22: 1000 ug via INTRAMUSCULAR

## 2011-03-22 NOTE — Patient Instructions (Addendum)
I want you to come back for a nurse visit in 1 month to get another B12 shot.  You'll get 1 shot a month.  I'll call the neuro clinic and then notify you.   If you have any symptoms like you did before, then call 911.

## 2011-03-22 NOTE — Progress Notes (Signed)
He had gone to bed, awoke and fell trying to get out of bed. He tried to get up and then fell to the L side.  When he tried to get up, he was staggering down the hall.  He called for his wife and went to ER.  Admitted to [facility] 7/17-7/19 with likely TIA.  He wasn't dizzy, but he would list to the side.  Walking started to get better by leaving the hospital, near baseline now.  Speech is okay, swallowing okay per patient.  "I'm close to 100%, but I quit my job."  Echo reviewed with calcification in aortic arch noted.  MRA/MRI w/o acute changes.  No focal weakness in meantime.    B12 def noted on labs.    PMH and SH reviewed  ROS: See HPI, otherwise noncontributory.  Meds, vitals, and allergies reviewed.   GEN: nad, alert and oriented HEENT: mucous membranes moist NECK: supple w/o LA CV: rrr.  PULM: ctab, no inc wob ABD: soft, +bs EXT: no edema SKIN: no acute rash CN 2-12 wnl B, S/S/DTR wnl x4  Recent labs d/w pt along with imaging.

## 2011-03-23 ENCOUNTER — Telehealth: Payer: Self-pay | Admitting: Family Medicine

## 2011-03-23 ENCOUNTER — Encounter: Payer: Self-pay | Admitting: Family Medicine

## 2011-03-23 DIAGNOSIS — E538 Deficiency of other specified B group vitamins: Secondary | ICD-10-CM | POA: Insufficient documentation

## 2011-03-23 DIAGNOSIS — G459 Transient cerebral ischemic attack, unspecified: Secondary | ICD-10-CM | POA: Insufficient documentation

## 2011-03-23 NOTE — Assessment & Plan Note (Signed)
On ASA, CCB, ACE, HCTZ and statin.  LDL <100, glucose not elevated.  Goal for secondary prevention and I'll d/w pt's case with neuro about aortic arch eval with possible CTA.  Will notify pt at that point.  He agrees with plan.  >45 min spent with face to face with patient, >50% counseling and/or coordinating care.

## 2011-03-23 NOTE — Assessment & Plan Note (Signed)
Not anemic, start replacement today.  D/w pt.

## 2011-03-23 NOTE — Telephone Encounter (Signed)
I talked to Dr. Pearlean Brownie and then I called the patient.  Dr. Pearlean Brownie and I agreed about the following: there wasn't thrombus seen on the echo and getting CTA would likely not change management (we would not directly intervene on the lesion).  Therefore, we will not proceed with CTA.  Continue current meds.  Pt understood, agreed.  He'll f/u with neuro.

## 2011-05-03 ENCOUNTER — Ambulatory Visit (INDEPENDENT_AMBULATORY_CARE_PROVIDER_SITE_OTHER): Payer: Medicare Other | Admitting: *Deleted

## 2011-05-03 DIAGNOSIS — E538 Deficiency of other specified B group vitamins: Secondary | ICD-10-CM

## 2011-05-03 MED ORDER — CYANOCOBALAMIN 1000 MCG/ML IJ SOLN
1000.0000 ug | Freq: Once | INTRAMUSCULAR | Status: AC
  Administered 2011-05-03: 1000 ug via INTRAMUSCULAR

## 2011-06-02 ENCOUNTER — Ambulatory Visit (INDEPENDENT_AMBULATORY_CARE_PROVIDER_SITE_OTHER): Payer: Medicare Other

## 2011-06-02 DIAGNOSIS — E538 Deficiency of other specified B group vitamins: Secondary | ICD-10-CM

## 2011-06-02 MED ORDER — CYANOCOBALAMIN 1000 MCG/ML IJ SOLN
1000.0000 ug | Freq: Once | INTRAMUSCULAR | Status: AC
  Administered 2011-06-02: 1000 ug via INTRAMUSCULAR

## 2011-06-11 NOTE — H&P (Signed)
[name], [name] NO.:  [id number]  MEDICAL RECORD NO.:  [id number]  LOCATION:                                 FACILITY:  PHYSICIAN:  Lonia Blood, M.D.      DATE OF BIRTH:  [birth year]  DATE OF ADMISSION:  03/16/2011 DATE OF DISCHARGE:                             HISTORY & PHYSICAL   PRIMARY CARE PHYSICIAN:  [facility], Dr. Para March.  PRESENTING COMPLAINT:  Unable to walk and ataxia.  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 and could not walk without falling into walls.  He denied any dizziness or vertigo, but has not been able to stand on his feet.  He has been very much ataxic and has been demonstrated it here in the ED. No changes in his speech.  No focal weakness.  The symptoms are constant and gradually improving in the ED, but he is still weak and not able to walk.  He has past medical history that includes hypertension, pneumonia, vertigo, also hyperlipidemia.  A code stroke has not been called for the patient.  This is because he came outside the window for possible stroke.  His past medical history is significant for; 1. Hypertension. 2. COPD. 3. BPH. 4. History of vertigo. 5. History of pneumonia. 6. Hyperlipidemia. 7. Also status post back surgery. 8. Status post cardiac cath in the past. 9. History of a 4 mm left lower lobe nodule.  ALLERGIES:  He has no known drug allergies.  CURRENT MEDICATIONS: 1. Amlodipine 10 mg daily. 2. Hydrochlorothiazide 25 mg daily. 3. Lisinopril 20 mg daily. 4. Simvastatin 20 mg daily.  SOCIAL HISTORY:  The patient lives in [place].  He still works as a Electrical engineer.  He is a former tobacco smoker; he has quit many years ago.  No IV drug use and no alcohol.  Family history is noncontributory.  REVIEW OF  SYSTEMS:  All systems reviewed currently negative except per HPI.  PHYSICAL EXAMINATION:  VITAL SIGNS:  Temperature is 98.4, initial blood pressure 189/84, his pulse 81, respiratory rate 14, sats 95% on room air. GENERAL:  The patient is awake, alert, oriented, very pleasant man.  He is in no acute distress. HEENT:  PERRL.  EOMI.  No significant pallor, no jaundice.  No rhinorrhea. NECK:  Supple.  No JVD, no lymphadenopathy. RESPIRATORY:  He has good air entry bilaterally.  No wheezes, no rales, no crackles. CARDIOVASCULAR:  He has S1, S2.  No audible murmurs. ABDOMEN:  Soft, full, nontender with positive bowel sounds.  His extremities showed no edema, cyanosis, or clubbing. SKIN:  No rashes or ulcers. NEURO:  Cranial nerves II-XII seem to be intact.  Power is 5/5 upper and lower extremities respectively and reflexes 2+.  His coordination is off with what appears to be disorientation, and he has an ataxic gait.  LABORATORY DATA:  White count is 11.2, hemoglobin 14.3 with platelet count of 234 with absolute neutrophil count of 7.8.  Sodium 135, potassium 3.9, chloride 95, CO2 30, glucose 93, BUN 18, creatinine 0.87. LFTs within normal.  Albumin 3.8.  Initial cardiac enzymes are negative. Head CT without contrast is negative.  His MRI is done and this apparently shows no acute findings at this point.  ASSESSMENT:  This is an 77 year old gentleman presenting with symptoms and exam findings of possible brainstem or posterior circulation stroke, although the MRI so far has been negative.  His ataxia is more than likely a cerebrovascular accident based on the sudden nature and the fact that the patient has risk factors for cerebrovascular accident anyway.  Plan therefore would be; 1. Ataxia.  The patient will be admitted for possible cerebrovascular     accident.  We are getting neurology involvement at the moment.     Once he passes swallow evaluation, we will put him on aspirin.      PT/OT will do workup including checking his B12, homocysteine     levels, check his 2-D echo, MRA of the brain, as well as carotid     Dopplers.  Depending on how that turns out, we will plan further     treatment. 2. Hypertension.  We will have permissive hypertension now in the     setting of possible stroke. 3. Hyperlipidemia.  Again, we will check fasting lipid panel as part     of the workup of his stroke.  Once he passes the swallow evaluation     as indicated, we will continue statins. 4. Chronic obstructive pulmonary disease.  I will put him on empiric     nebulizers. 5. History of benign prostatic hypertrophy.  He is stable at this     point.  I will not make any changes to his regular medications. 6. History of vertigo.  The patient's symptoms at this point are not     consistent with vertigo, but we will still get PT/OT involved as     indicated.     Lonia Blood, M.D.     Verlin Grills  D:  03/16/2011  T:  03/16/2011  Job:  [id number]  Electronically Signed by Lonia Blood M.D. on 06/11/2011 02:44:17 PM

## 2011-06-16 ENCOUNTER — Encounter: Payer: Self-pay | Admitting: Family Medicine

## 2011-07-10 ENCOUNTER — Encounter: Payer: Self-pay | Admitting: Family Medicine

## 2011-07-26 ENCOUNTER — Ambulatory Visit (INDEPENDENT_AMBULATORY_CARE_PROVIDER_SITE_OTHER): Payer: Medicare Other | Admitting: *Deleted

## 2011-07-26 DIAGNOSIS — E538 Deficiency of other specified B group vitamins: Secondary | ICD-10-CM

## 2011-07-26 MED ORDER — CYANOCOBALAMIN 1000 MCG/ML IJ SOLN
1000.0000 ug | Freq: Once | INTRAMUSCULAR | Status: AC
  Administered 2011-07-26: 1000 ug via INTRAMUSCULAR

## 2011-08-29 ENCOUNTER — Ambulatory Visit: Payer: Medicare Other | Admitting: Internal Medicine

## 2011-08-29 ENCOUNTER — Ambulatory Visit (INDEPENDENT_AMBULATORY_CARE_PROVIDER_SITE_OTHER): Payer: Medicare Other | Admitting: Family Medicine

## 2011-08-29 ENCOUNTER — Encounter: Payer: Self-pay | Admitting: Family Medicine

## 2011-08-29 VITALS — BP 110/60 | HR 88 | Temp 98.9°F | Wt 143.0 lb

## 2011-08-29 DIAGNOSIS — E538 Deficiency of other specified B group vitamins: Secondary | ICD-10-CM

## 2011-08-29 DIAGNOSIS — R0789 Other chest pain: Secondary | ICD-10-CM

## 2011-08-29 DIAGNOSIS — R071 Chest pain on breathing: Secondary | ICD-10-CM

## 2011-08-29 MED ORDER — CYANOCOBALAMIN 1000 MCG/ML IJ SOLN
1000.0000 ug | Freq: Once | INTRAMUSCULAR | Status: AC
  Administered 2011-08-29: 1000 ug via INTRAMUSCULAR

## 2011-08-29 NOTE — Progress Notes (Signed)
He fell Friday night and hit his chest on the side of the porch.  No LOC.  This was the first fall.  He has h/o CVA and was walking with this hands full.  His wife heard him go down and she checked on him.  EMS called, checked pt at house but not transported to ER.  Not sob now, no pain with deep breath.  Pain with cough.  He took 2 aleve this AM.  No fevers, some cough but no sputum.  Wife has been sick.    Meds, vitals, and allergies reviewed.   ROS: See HPI.  Otherwise, noncontributory.  nad ncat Tm wnl Mild nasal congestion Op wnl except for mild cobblestoning, no exudates Neck supple rrr ctab L anterior chest wall ttp w/o brusing abd benign Ext well perfused Gross motor wnl x4

## 2011-08-29 NOTE — Patient Instructions (Signed)
I would consider using a pillow when you cough.  Take aleve with food, don't take more than 4 in a day.  Take as little aleve as possible.  This should gradually get better.

## 2011-08-30 ENCOUNTER — Encounter: Payer: Self-pay | Admitting: Family Medicine

## 2011-08-30 DIAGNOSIS — R0789 Other chest pain: Secondary | ICD-10-CM | POA: Insufficient documentation

## 2011-08-30 NOTE — Assessment & Plan Note (Signed)
With mild URI sx.  Chest is ctab.  Likely chest wall irritation, but I don't see that imaging would change mgmt.  D/w pt, he agreed, use NSAIDS prn with food, GI caution and for limited time.  Then f/u prn.  He agrees.

## 2011-09-20 ENCOUNTER — Ambulatory Visit: Payer: Medicare Other

## 2011-09-29 ENCOUNTER — Ambulatory Visit (INDEPENDENT_AMBULATORY_CARE_PROVIDER_SITE_OTHER): Payer: Medicare Other | Admitting: *Deleted

## 2011-09-29 DIAGNOSIS — E538 Deficiency of other specified B group vitamins: Secondary | ICD-10-CM

## 2011-09-29 MED ORDER — CYANOCOBALAMIN 1000 MCG/ML IJ SOLN
1000.0000 ug | Freq: Once | INTRAMUSCULAR | Status: AC
  Administered 2011-09-29: 1000 ug via INTRAMUSCULAR

## 2011-10-03 ENCOUNTER — Ambulatory Visit (INDEPENDENT_AMBULATORY_CARE_PROVIDER_SITE_OTHER): Payer: Medicare Other | Admitting: Family Medicine

## 2011-10-03 ENCOUNTER — Encounter: Payer: Self-pay | Admitting: Family Medicine

## 2011-10-03 DIAGNOSIS — R1310 Dysphagia, unspecified: Secondary | ICD-10-CM

## 2011-10-03 DIAGNOSIS — R1319 Other dysphagia: Secondary | ICD-10-CM

## 2011-10-03 NOTE — Patient Instructions (Addendum)
[name] will call you about the referral. Take care.

## 2011-10-03 NOTE — Progress Notes (Signed)
Saturday night he had an episode after dinner. He started with a hiccup, and then the he vomited phlegm.  This is the typical course, but the recent episode last longer than normal- he was nauseated longer than normal.  No blood in vomit.  No blood in stool.  No fever, chills, diarrhea.  No bruising.  Chest wall is still sore from fall recently, but it is better than it was.  No sig weight loss, but down 7 lbs in last 1.5 years.    Episodes going on since 03/2010.    Meds, vitals, and allergies reviewed.   ROS: See HPI.  Otherwise, noncontributory.  nad ncat Mmm rrr ctab abd soft, not ttp Ext w/o edema

## 2011-10-03 NOTE — Assessment & Plan Note (Signed)
D/w pt.  Refer to GI.  I wonder if he has a HH or esophageal stricture.  I'd like GI input on w/u.  D/w pt.  He agrees.

## 2011-10-18 ENCOUNTER — Encounter: Payer: Self-pay | Admitting: Internal Medicine

## 2011-10-18 ENCOUNTER — Ambulatory Visit (INDEPENDENT_AMBULATORY_CARE_PROVIDER_SITE_OTHER): Payer: Medicare Other | Admitting: Internal Medicine

## 2011-10-18 VITALS — BP 130/64 | HR 76 | Ht 69.0 in | Wt 143.8 lb

## 2011-10-18 DIAGNOSIS — K5900 Constipation, unspecified: Secondary | ICD-10-CM

## 2011-10-18 DIAGNOSIS — R1310 Dysphagia, unspecified: Secondary | ICD-10-CM
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
## 2011-10-18 NOTE — Patient Instructions (Signed)
You have been scheduled for an endoscopy with propofol. Please follow written instructions given to you at your visit today.  

## 2011-10-19 ENCOUNTER — Ambulatory Visit (AMBULATORY_SURGERY_CENTER): Payer: Medicare Other | Admitting: Internal Medicine

## 2011-10-19 ENCOUNTER — Encounter: Payer: Self-pay | Admitting: Internal Medicine

## 2011-10-19 VITALS — BP 147/82 | HR 87 | Temp 96.5°F | Resp 19 | Ht 69.0 in | Wt 143.0 lb

## 2011-10-19 DIAGNOSIS — K2100 Gastro-esophageal reflux disease with esophagitis, without bleeding: Secondary | ICD-10-CM

## 2011-10-19 DIAGNOSIS — R1310 Dysphagia, unspecified: Secondary | ICD-10-CM

## 2011-10-19 DIAGNOSIS — K2980 Duodenitis without bleeding: Secondary | ICD-10-CM

## 2011-10-19 DIAGNOSIS — K222 Esophageal obstruction: Secondary | ICD-10-CM

## 2011-10-19 MED ORDER — SODIUM CHLORIDE 0.9 % IV SOLN: 500.0000 mL | INTRAVENOUS | Status: DC

## 2011-10-19 MED ORDER — OMEPRAZOLE 20 MG PO CPDR: 20.0000 mg | DELAYED_RELEASE_CAPSULE | Freq: Every day | ORAL | Status: DC

## 2011-10-19 NOTE — Progress Notes (Signed)
Propofol administered per [facility] per protocol. See scanned intra procedure report. ewm  1505 pt brought to room for procedure. 25 minute wait on a clean scope. Patient notified waiting on equipment with verbal understanding andmd aware with no further orders. ewm

## 2011-10-19 NOTE — Progress Notes (Signed)
Patient did not experience any of the following events: a burn prior to discharge; a fall within the facility; wrong site/side/patient/procedure/implant event; or a hospital transfer or hospital admission upon discharge from the facility. (G8907) Patient did not have preoperative order for IV antibiotic SSI prophylaxis. (G8918)  

## 2011-10-19 NOTE — Patient Instructions (Addendum)
YOU HAD AN ENDOSCOPIC PROCEDURE TODAY AT THE  ENDOSCOPY CENTER: Refer to the procedure report that was given to you for any specific questions about what was found during the examination.  If the procedure report does not answer your questions, please call your gastroenterologist to clarify.  If you requested that your care partner not be given the details of your procedure findings, then the procedure report has been included in a sealed envelope for you to review at your convenience later.  YOU SHOULD EXPECT: Some feelings of bloating in the abdomen. Passage of more gas than usual.  Walking can help get rid of the air that was put into your GI tract during the procedure and reduce the bloating. If you had a lower endoscopy (such as a colonoscopy or flexible sigmoidoscopy) you may notice spotting of blood in your stool or on the toilet paper. If you underwent a bowel prep for your procedure, then you may not have a normal bowel movement for a few days.  DIET: See dysphagia diet and Dr. Lamar Sprinkles report.  ACTIVITY: Your care partner should take you home directly after the procedure.  You should plan to take it easy, moving slowly for the rest of the day.  You can resume normal activity the day after the procedure however you should NOT DRIVE or use heavy machinery for 24 hours (because of the sedation medicines used during the test).    SYMPTOMS TO REPORT IMMEDIATELY: A gastroenterologist can be reached at any hour.  During normal business hours, 8:30 AM to 5:00 PM Monday through Friday, call [phone].  After hours and on weekends, please call the GI answering service at [phone] who will take a message and have the physician on call contact you.   Following lower endoscopy (colonoscopy or flexible sigmoidoscopy):  Excessive amounts of blood in the stool  Significant tenderness or worsening of abdominal pains  Swelling of the abdomen that is new, acute  Fever of 100F or  higher  Following upper endoscopy (EGD)  Vomiting of blood or coffee ground material  New chest pain or pain under the shoulder blades  Painful or persistently difficult swallowing  New shortness of breath  Fever of 100F or higher  Black, tarry-looking stools  FOLLOW UP: If any biopsies were taken you will be contacted by phone or by letter within the next 1-3 weeks.  Call your gastroenterologist if you have not heard about the biopsies in 3 weeks.  Our staff will call the home number listed on your records the next business day following your procedure to check on you and address any questions or concerns that you may have at that time regarding the information given to you following your procedure. This is a courtesy call and so if there is no answer at the home number and we have not heard from you through the emergency physician on call, we will assume that you have returned to your regular daily activities without incident.  SIGNATURES/CONFIDENTIALITY: You and/or your care partner have signed paperwork which will be entered into your electronic medical record.  These signatures attest to the fact that that the information above on your After Visit Summary has been reviewed and is understood.  Full responsibility of the confidentiality of this discharge information lies with you and/or your care-partner.

## 2011-10-19 NOTE — Op Note (Signed)
[facility] [address] [place], [place]  [address]  ENDOSCOPY PROCEDURE REPORT  PATIENT:  [name], [name]  MR#:  [id number] BIRTHDATE:  [birth year], 86 yrs. old  GENDER:  male  ENDOSCOPIST:  Wilhemina Bonito. Eda Keys, MD Referred by:  Crawford Givens, M.D.  PROCEDURE DATE:  10/19/2011 PROCEDURE:  EGD with biopsy, 43239, Maloney Dilation of Esophagus ASA CLASS:  Class II INDICATIONS:  dysphagia  MEDICATIONS:   MAC sedation, administered by CRNA, propofol (Diprivan) 100 mg IV TOPICAL ANESTHETIC:  none  DESCRIPTION OF PROCEDURE:   After the risks benefits and alternatives of the procedure were thoroughly explained, informed consent was obtained.  The LB GIF-H180 [id number] endoscope was introduced through the mouth and advanced to the second portion of the duodenum, without limitations.  The instrument was slowly withdrawn as the mucosa was fully examined. <<PROCEDUREIMAGES>>  Esophagitis (friability and edema) was found in the distal esophagus as well as a 14mm ring-like stricture.  The stomach was entered and closely examined. The antrum, angularis, and lesser curvature were well visualized, including a retroflexed view of the cardia and fundus. The stomach wall was normally distensable. The scope passed easily through the pylorus into the duodenum. Duodenitis was found in the duodenal bulb. CLO bx taken. Retroflexed views revealed a small hiatal hernia.    The scope was then withdrawn from the patient and the procedure completed.  THERAPY: 56 F MALONEY DILATOR PASSED W/O REISTANCE OR HEME. TOLERATED WELL.  COMPLICATIONS:  None  ENDOSCOPIC IMPRESSION: 1) Esophagitis in the distal esophagus 2) Stricture in the distal esophagus - S/P DILATION 3) Normal stomach 4) Duodenitis in the duodenal bulb duodenum - S/P CLO 5) GERD  RECOMMENDATIONS: 1) Clear liquids until 5 PM, then soft foods rest of day. Resume prior diet tomorrow. 2) PRESCRIBE OMEPRAZOLE 20 MG DAILY; #30; 11  REFILLS 3) TREAT CLO IF + 4) OFFICE FOLLOW UP APPOINTMENT WITH DR Marina Goodell IN ABOUT 4 WEEKS  ______________________________ Wilhemina Bonito. Eda Keys, MD  CC:  Crawford Givens, MD;   The Patient  n. eSIGNED:   Wilhemina Bonito. Eda Keys at 10/19/2011 03:47 PM  Michel Santee, [phone]

## 2011-10-20 ENCOUNTER — Telehealth: Payer: Self-pay

## 2011-10-20 NOTE — Telephone Encounter (Signed)
[phone] number has been disconnected. Maw  I called his cell number and it said the person is not available and it did not have a answering service to leave a message. maw

## 2011-10-26 ENCOUNTER — Encounter: Payer: Self-pay | Admitting: Family Medicine

## 2011-10-26 DIAGNOSIS — K209 Esophagitis, unspecified: Secondary | ICD-10-CM

## 2011-10-28 ENCOUNTER — Ambulatory Visit (INDEPENDENT_AMBULATORY_CARE_PROVIDER_SITE_OTHER): Payer: Medicare Other | Admitting: *Deleted

## 2011-10-28 DIAGNOSIS — E538 Deficiency of other specified B group vitamins: Secondary | ICD-10-CM

## 2011-10-28 MED ORDER — CYANOCOBALAMIN 1000 MCG/ML IJ SOLN
1000.0000 ug | Freq: Once | INTRAMUSCULAR | Status: AC
  Administered 2011-10-28: 1000 ug via INTRAMUSCULAR

## 2011-11-14 ENCOUNTER — Ambulatory Visit (AMBULATORY_SURGERY_CENTER): Payer: Medicare Other | Admitting: *Deleted

## 2011-11-14 ENCOUNTER — Encounter: Payer: Self-pay | Admitting: Internal Medicine

## 2011-11-14 VITALS — Ht 67.0 in | Wt 148.8 lb

## 2011-11-14 DIAGNOSIS — Z1211 Encounter for screening for malignant neoplasm of colon: Secondary | ICD-10-CM

## 2011-11-14 MED ORDER — PEG-KCL-NACL-NASULF-NA ASC-C 100 G PO SOLR: 1.0000 | Freq: Once | ORAL | Status: DC

## 2011-11-16 ENCOUNTER — Ambulatory Visit (INDEPENDENT_AMBULATORY_CARE_PROVIDER_SITE_OTHER): Payer: Medicare Other | Admitting: Internal Medicine

## 2011-11-16 ENCOUNTER — Encounter: Payer: Self-pay | Admitting: Internal Medicine

## 2011-11-16 VITALS — BP 142/56 | HR 68 | Ht 66.0 in | Wt 148.1 lb

## 2011-11-16 DIAGNOSIS — K5900 Constipation, unspecified: Secondary | ICD-10-CM

## 2011-11-16 DIAGNOSIS — K219 Gastro-esophageal reflux disease without esophagitis: Secondary | ICD-10-CM

## 2011-11-16 DIAGNOSIS — Z1211 Encounter for screening for malignant neoplasm of colon: Secondary | ICD-10-CM

## 2011-11-16 DIAGNOSIS — K625 Hemorrhage of anus and rectum: Secondary | ICD-10-CM

## 2011-11-16 DIAGNOSIS — K222 Esophageal obstruction: Secondary | ICD-10-CM

## 2011-11-16 NOTE — Patient Instructions (Signed)
Please follow up as needed 
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
## 2011-11-22 ENCOUNTER — Ambulatory Visit (AMBULATORY_SURGERY_CENTER): Payer: Medicare Other | Admitting: Internal Medicine

## 2011-11-22 ENCOUNTER — Encounter: Payer: Self-pay | Admitting: Internal Medicine

## 2011-11-22 VITALS — BP 153/80 | HR 70 | Temp 95.8°F | Resp 20 | Ht 66.0 in | Wt 148.0 lb

## 2011-11-22 DIAGNOSIS — Z1211 Encounter for screening for malignant neoplasm of colon: Secondary | ICD-10-CM

## 2011-11-22 DIAGNOSIS — D126 Benign neoplasm of colon, unspecified: Secondary | ICD-10-CM

## 2011-11-22 DIAGNOSIS — K621 Rectal polyp: Secondary | ICD-10-CM

## 2011-11-22 DIAGNOSIS — K620 Anal polyp: Secondary | ICD-10-CM

## 2011-11-22 MED ORDER — SODIUM CHLORIDE 0.9 % IV SOLN: 500.0000 mL | INTRAVENOUS | Status: DC

## 2011-11-22 NOTE — Patient Instructions (Addendum)
CONTINUE YOUR MIRALAX AS NEEDED FOR CONSTIPATION.  GI FOLLOW UP AS NEEDED.YOU HAD AN ENDOSCOPIC PROCEDURE TODAY AT THE [facility]: Refer to the procedure report that was given to you for any specific questions about what was found during the examination.  If the procedure report does not answer your questions, please call your gastroenterologist to clarify.  If you requested that your care partner not be given the details of your procedure findings, then the procedure report has been included in a sealed envelope for you to review at your convenience later.  YOU SHOULD EXPECT: Some feelings of bloating in the abdomen. Passage of more gas than usual.  Walking can help get rid of the air that was put into your GI tract during the procedure and reduce the bloating. If you had a lower endoscopy (such as a colonoscopy or flexible sigmoidoscopy) you may notice spotting of blood in your stool or on the toilet paper. If you underwent a bowel prep for your procedure, then you may not have a normal bowel movement for a few days.  DIET: Your first meal following the procedure should be a light meal and then it is ok to progress to your normal diet.  A half-sandwich or bowl of soup is an example of a good first meal.  Heavy or fried foods are harder to digest and may make you feel nauseous or bloated.  Likewise meals heavy in dairy and vegetables can cause extra gas to form and this can also increase the bloating.  Drink plenty of fluids but you should avoid alcoholic beverages for 24 hours.  ACTIVITY: Your care partner should take you home directly after the procedure.  You should plan to take it easy, moving slowly for the rest of the day.  You can resume normal activity the day after the procedure however you should NOT DRIVE or use heavy machinery for 24 hours (because of the sedation medicines used during the test).    SYMPTOMS TO REPORT IMMEDIATELY: A gastroenterologist can be reached at any hour.   During normal business hours, 8:30 AM to 5:00 PM Monday through Friday, call [phone].  After hours and on weekends, please call the GI answering service at [phone] who will take a message and have the physician on call contact you.   Following lower endoscopy (colonoscopy or flexible sigmoidoscopy):  Excessive amounts of blood in the stool  Significant tenderness or worsening of abdominal pains  Swelling of the abdomen that is new, acute  Fever of 100F or higher  Following upper endoscopy (EGD)  Vomiting of blood or coffee ground material  New chest pain or pain under the shoulder blades  Painful or persistently difficult swallowing  New shortness of breath  Fever of 100F or higher  Black, tarry-looking stools  FOLLOW UP: If any biopsies were taken you will be contacted by phone or by letter within the next 1-3 weeks.  Call your gastroenterologist if you have not heard about the biopsies in 3 weeks.  Our staff will call the home number listed on your records the next business day following your procedure to check on you and address any questions or concerns that you may have at that time regarding the information given to you following your procedure. This is a courtesy call and so if there is no answer at the home number and we have not heard from you through the emergency physician on call, we will assume that you have returned to  your regular daily activities without incident.  SIGNATURES/CONFIDENTIALITY: You and/or your care partner have signed paperwork which will be entered into your electronic medical record.  These signatures attest to the fact that that the information above on your After Visit Summary has been reviewed and is understood.  Full responsibility of the confidentiality of this discharge information lies with you and/or your care-partner.

## 2011-11-22 NOTE — Progress Notes (Signed)
Patient did not experience any of the following events: a burn prior to discharge; a fall within the facility; wrong site/side/patient/procedure/implant event; or a hospital transfer or hospital admission upon discharge from the facility. (G8907) Patient did not have preoperative order for IV antibiotic SSI prophylaxis. (G8918)  

## 2011-11-22 NOTE — Op Note (Signed)
[facility] [address] [place], [place]  [address]  COLONOSCOPY PROCEDURE REPORT  PATIENT:  Chase, Aguilar  MR#:  [id number] BIRTHDATE:  [birth year], 86 yrs. old  GENDER:  male ENDOSCOPIST:  Wilhemina Bonito. Eda Keys, MD REF. BY:  Office PROCEDURE DATE:  11/22/2011 PROCEDURE:  Colonoscopy with snare polypectomy X 3 ASA CLASS:  Class II INDICATIONS:  colorectal cancer screening, average risk, constipation, MINOR BLEEDING MEDICATIONS:   MAC sedation, administered by CRNA, propofol (Diprivan) 210 mg IV  DESCRIPTION OF PROCEDURE:   After the risks benefits and alternatives of the procedure were thoroughly explained, informed consent was obtained.  Digital rectal exam was performed and revealed no abnormalities.   The LB CF-H180AL [id number] endoscope was introduced through the anus and advanced to the cecum, which was identified by both the appendix and ileocecal valve, without limitations.  The quality of the prep was good, using MoviPrep. The instrument was then slowly withdrawn as the colon was fully examined. <<PROCEDUREIMAGES>>  FINDINGS:  Three 5mm polyps were found in the rectum and snared without cautery. Retrieval was successful. Otherwise normal colonoscopy without other polyps, masses, vascular ectasias, or inflammatory changes.   Retroflexed views in the rectum revealed internal hemorrhoids.    The time to cecum = 2:33  minutes. The scope was then withdrawn in 8:23  minutes from the cecum and the procedure completed.  COMPLICATIONS:  None  ENDOSCOPIC IMPRESSION: 1) Three polyps in the rectum - removed 2) Otherwise normal colonoscopy 3) Internal hemorrhoids  RECOMMENDATIONS: 1)  Continue Miralax as needed for constipation 2)  Return to the care of your primary provider. GI follow up as needed  ______________________________ Wilhemina Bonito. Eda Keys, MD  CC:  Crawford Givens, MD;  The Patient  n. eSIGNED:   Wilhemina Bonito. Eda Keys at 11/22/2011 04:35 PM  Michel Santee,  [phone]

## 2011-11-23 ENCOUNTER — Telehealth: Payer: Self-pay | Admitting: *Deleted

## 2011-11-23 NOTE — Telephone Encounter (Signed)
  Follow up Call-  Call back number 11/22/2011 10/19/2011  Post procedure Call Back phone  # [phone]  L/M  Permission to leave phone message Yes Yes     Patient questions:  Do you have a fever, pain , or abdominal swelling? no Pain Score  0 *  Have you tolerated food without any problems? yes  Have you been able to return to your normal activities? yes  Do you have any questions about your discharge instructions: Diet   no Medications  no Follow up visit  no  Do you have questions or concerns about your Care? no  Actions: * If pain score is 4 or above: No action needed, pain <4.

## 2011-12-02 ENCOUNTER — Other Ambulatory Visit: Payer: Self-pay | Admitting: *Deleted

## 2011-12-02 MED ORDER — HYDROCHLOROTHIAZIDE 25 MG PO TABS: 25.0000 mg | ORAL_TABLET | Freq: Every day | ORAL | Status: DC

## 2011-12-02 MED ORDER — AMLODIPINE BESYLATE 10 MG PO TABS: 10.0000 mg | ORAL_TABLET | Freq: Every day | ORAL | Status: DC

## 2011-12-02 MED ORDER — LISINOPRIL 40 MG PO TABS: 20.0000 mg | ORAL_TABLET | Freq: Every day | ORAL | Status: DC

## 2011-12-02 MED ORDER — SIMVASTATIN 20 MG PO TABS: ORAL_TABLET | ORAL | Status: DC

## 2011-12-05 ENCOUNTER — Encounter: Payer: Self-pay | Admitting: Internal Medicine

## 2012-01-12 ENCOUNTER — Ambulatory Visit: Payer: Medicare Other | Admitting: Family Medicine

## 2012-01-12 ENCOUNTER — Ambulatory Visit (INDEPENDENT_AMBULATORY_CARE_PROVIDER_SITE_OTHER): Payer: Medicare Other | Admitting: *Deleted

## 2012-01-12 DIAGNOSIS — E538 Deficiency of other specified B group vitamins: Secondary | ICD-10-CM

## 2012-01-12 MED ORDER — CYANOCOBALAMIN 1000 MCG/ML IJ SOLN
1000.0000 ug | Freq: Once | INTRAMUSCULAR | Status: AC
  Administered 2012-01-12: 1000 ug via INTRAMUSCULAR

## 2012-01-19 ENCOUNTER — Other Ambulatory Visit: Payer: Self-pay | Admitting: Orthopedic Surgery

## 2012-01-19 DIAGNOSIS — M25552 Pain in left hip: Secondary | ICD-10-CM

## 2012-01-25 ENCOUNTER — Ambulatory Visit
Admission: RE | Admit: 2012-01-25 | Discharge: 2012-01-25 | Disposition: A | Discharge status: Home / Self Care | Payer: Medicare Other | Source: Ambulatory Visit | Attending: Orthopedic Surgery | Admitting: Orthopedic Surgery

## 2012-01-25 DIAGNOSIS — M25552 Pain in left hip: Secondary | ICD-10-CM

## 2012-05-10 ENCOUNTER — Telehealth: Payer: Self-pay

## 2012-05-10 DIAGNOSIS — E538 Deficiency of other specified B group vitamins: Secondary | ICD-10-CM

## 2012-05-10 DIAGNOSIS — I10 Essential (primary) hypertension: Secondary | ICD-10-CM

## 2012-05-10 NOTE — Telephone Encounter (Signed)
Pt said has been awhile since took B 12 shots and pt wanted to  Know if he should start back. Pt last Vit B12 01/12/12;last seen 10/03/11 and last Vit b 12 lab 02/2011.Please advise.

## 2012-05-11 NOTE — Telephone Encounter (Signed)
I would check a level first.  He's due for labs and a physical.  I would proceed with labs and schedule the physical for sometime this fall.  Thanks.

## 2012-05-14 NOTE — Telephone Encounter (Signed)
Patient advised.  Lab and CPE appt scheduled.

## 2012-05-24 ENCOUNTER — Other Ambulatory Visit (INDEPENDENT_AMBULATORY_CARE_PROVIDER_SITE_OTHER): Payer: Medicare Other

## 2012-05-24 DIAGNOSIS — E538 Deficiency of other specified B group vitamins: Secondary | ICD-10-CM

## 2012-05-24 DIAGNOSIS — I10 Essential (primary) hypertension: Secondary | ICD-10-CM

## 2012-05-24 LAB — COMPREHENSIVE METABOLIC PANEL
ALT: 13 U/L (ref 0–53)
BUN: 18 mg/dL (ref 6–23)
CO2: 32 mEq/L (ref 19–32)
Calcium: 9.2 mg/dL (ref 8.4–10.5)
Chloride: 97 mEq/L (ref 96–112)
Creatinine, Ser: 1.2 mg/dL (ref 0.4–1.5)
GFR: 61.7 mL/min (ref >60.00)
Glucose, Bld: 86 mg/dL (ref 70–99)

## 2012-05-24 LAB — LIPID PANEL: VLDL: 19.8 mg/dL (ref 0.0–40.0)

## 2012-05-24 LAB — VITAMIN B12: Vitamin B-12: 246 pg/mL (ref 211–911)

## 2012-05-31 ENCOUNTER — Encounter: Payer: Self-pay | Admitting: Family Medicine

## 2012-05-31 ENCOUNTER — Ambulatory Visit (INDEPENDENT_AMBULATORY_CARE_PROVIDER_SITE_OTHER): Payer: Medicare Other | Admitting: Family Medicine

## 2012-05-31 VITALS — BP 136/58 | HR 68 | Temp 98.0°F | Ht 65.5 in | Wt 145.0 lb

## 2012-05-31 DIAGNOSIS — E538 Deficiency of other specified B group vitamins: Secondary | ICD-10-CM

## 2012-05-31 DIAGNOSIS — Z23 Encounter for immunization: Secondary | ICD-10-CM

## 2012-05-31 DIAGNOSIS — G459 Transient cerebral ischemic attack, unspecified: Secondary | ICD-10-CM

## 2012-05-31 DIAGNOSIS — I10 Essential (primary) hypertension: Secondary | ICD-10-CM

## 2012-05-31 DIAGNOSIS — E7800 Pure hypercholesterolemia, unspecified: Secondary | ICD-10-CM

## 2012-05-31 DIAGNOSIS — Z0000 Encounter for general adult medical examination without abnormal findings: Secondary | ICD-10-CM | POA: Insufficient documentation

## 2012-05-31 NOTE — Assessment & Plan Note (Signed)
Controlled, labs d/w pt, continue current meds.

## 2012-05-31 NOTE — Assessment & Plan Note (Signed)
Labs d/w pt, continue current meds.

## 2012-05-31 NOTE — Assessment & Plan Note (Addendum)
Restart ASA for secondary prevention.  He'll check on balance/exercise classes at the [facility].

## 2012-05-31 NOTE — Assessment & Plan Note (Signed)
Routine anticipatory guidance given to patient. See health maintenance.  Tetanus 2013  Flu 2013  Shingles 2012  PNA 1999  Colonoscopy 2013  Prostate cancer screening not indicated.  Advance directive d/w pt. Has a living will.

## 2012-05-31 NOTE — Assessment & Plan Note (Signed)
Improved, continue PPI 

## 2012-05-31 NOTE — Patient Instructions (Addendum)
Check on the prilosec/omeprazole.  If you aren't taking it, then start back on it.  Take a coated baby aspirin (81mg ) a day.  Check on the balance/exercise classes at the [facility] and let me know if you need a referral. Recheck B12 in 6 months.  If we need to, we can restart the B12 shots.   Glad to see you.

## 2012-05-31 NOTE — Progress Notes (Signed)
I have personally reviewed the Medicare Annual Wellness questionnaire and have noted 1. The patient's medical and social history 2. Their use of alcohol, tobacco or illicit drugs 3. Their current medications and supplements 4. The patient's functional ability including ADL's, fall risks, home safety risks and hearing or visual             impairment. 5. Diet and physical activities 6. Evidence for depression or mood disorders  The patients weight, height, BMI have been recorded in the chart and visual acuity is per eye clinic.  I have made referrals, counseling and provided education to the patient based review of the above and I have provided the pt with a written personalized care plan for preventive services.  See scanned forms.  Routine anticipatory guidance given to patient.  See health maintenance. Tetanus 2013 Flu 2013 Shingles 2012 PNA 1999 Colonoscopy 2013 Prostate cancer screening not indicated.   Advance directive d/w pt.  Has a living will.   Dysphagia resolved after EGD with dilation.  Feels well.   H/o balance changes after TIA prev.  We discussed fall prevention.  See plan.    Hypertension:    Using medication without problems or lightheadedness: yes Chest pain with exertion:no Edema:no Short of breath:no  Elevated Cholesterol: Using medications without problems:yes Muscle aches: no Diet compliance:yes Exercise:some  H/o B12 def. Has been off replacement recently.  No new tingling in the feet.  Asking about options.   PMH and SH reviewed  Meds, vitals, and allergies reviewed.   ROS: See HPI.  Otherwise negative.    GEN: nad, alert and oriented HEENT: mucous membranes moist NECK: supple w/o LA CV: rrr. PULM: ctab, no inc wob ABD: soft, +bs EXT: no edema SKIN: no acute rash

## 2012-05-31 NOTE — Assessment & Plan Note (Signed)
Will stay off replacement for now and recheck in 6 months.  He agrees.

## 2012-09-25 ENCOUNTER — Telehealth: Payer: Self-pay | Admitting: Family Medicine

## 2012-09-25 NOTE — Telephone Encounter (Signed)
Patient's spouse brought in an Application for Disability Parking Placard form to be filled out for her husband [name].  She can be reached at [phone].

## 2012-09-25 NOTE — Telephone Encounter (Signed)
Form was signed and patient notified.  Form left at front desk for pick up.

## 2012-11-13 ENCOUNTER — Other Ambulatory Visit: Payer: Self-pay | Admitting: *Deleted

## 2012-11-13 MED ORDER — AMLODIPINE BESYLATE 10 MG PO TABS: 10.0000 mg | ORAL_TABLET | Freq: Every day | ORAL | Status: DC

## 2012-11-13 MED ORDER — LISINOPRIL 40 MG PO TABS: ORAL_TABLET | ORAL | Status: DC

## 2012-11-13 MED ORDER — HYDROCHLOROTHIAZIDE 25 MG PO TABS: 25.0000 mg | ORAL_TABLET | Freq: Every day | ORAL | Status: DC

## 2012-11-13 MED ORDER — SIMVASTATIN 20 MG PO TABS: ORAL_TABLET | ORAL | Status: DC

## 2012-11-14 ENCOUNTER — Telehealth: Payer: Self-pay

## 2012-11-14 MED ORDER — OMEPRAZOLE 20 MG PO CPDR: 20.0000 mg | DELAYED_RELEASE_CAPSULE | Freq: Every day | ORAL | Status: DC

## 2012-11-14 NOTE — Telephone Encounter (Signed)
Refilled Omeprazole 

## 2012-11-15 ENCOUNTER — Telehealth: Payer: Self-pay

## 2012-11-15 NOTE — Telephone Encounter (Signed)
Sent rx for Omeprazole to [facility] but they did not receive it.  Called pharmacy and gave rx verbally to April.

## 2012-11-19 ENCOUNTER — Telehealth: Payer: Self-pay

## 2012-11-19 NOTE — Telephone Encounter (Signed)
Pt request status of refills for amlodipine,HCTZ,Lisinopril and simvastatin. Advised pt sent to [facility] on 11/13/12. Pt will ck with [facility].

## 2013-01-18 ENCOUNTER — Ambulatory Visit (INDEPENDENT_AMBULATORY_CARE_PROVIDER_SITE_OTHER): Payer: Medicare Other | Admitting: Family Medicine

## 2013-01-18 ENCOUNTER — Encounter: Payer: Self-pay | Admitting: Family Medicine

## 2013-01-18 ENCOUNTER — Telehealth: Payer: Self-pay | Admitting: *Deleted

## 2013-01-18 VITALS — BP 106/54 | HR 90 | Temp 98.7°F | Wt 143.2 lb

## 2013-01-18 DIAGNOSIS — R05 Cough: Secondary | ICD-10-CM

## 2013-01-18 DIAGNOSIS — R059 Cough, unspecified: Secondary | ICD-10-CM | POA: Insufficient documentation

## 2013-01-18 DIAGNOSIS — Z9181 History of falling: Secondary | ICD-10-CM

## 2013-01-18 DIAGNOSIS — R269 Unspecified abnormalities of gait and mobility: Secondary | ICD-10-CM

## 2013-01-18 MED ORDER — ALBUTEROL SULFATE HFA 108 (90 BASE) MCG/ACT IN AERS: 2.0000 | INHALATION_SPRAY | RESPIRATORY_TRACT | Status: DC | PRN

## 2013-01-18 MED ORDER — HYDROCODONE-HOMATROPINE 5-1.5 MG/5ML PO SYRP: 5.0000 mL | ORAL_SOLUTION | Freq: Three times a day (TID) | ORAL | Status: DC | PRN

## 2013-01-18 MED ORDER — AMOXICILLIN 500 MG PO CAPS: 1000.0000 mg | ORAL_CAPSULE | Freq: Three times a day (TID) | ORAL | Status: DC

## 2013-01-18 MED ORDER — DOXYCYCLINE HYCLATE 100 MG PO TABS: 100.0000 mg | ORAL_TABLET | Freq: Two times a day (BID) | ORAL | Status: DC

## 2013-01-18 NOTE — Telephone Encounter (Signed)
Patient advised. Medication phoned to pharmacy.  

## 2013-01-18 NOTE — Telephone Encounter (Signed)
Wife called back stating that [name] didn't ask for any cough medicine and they were both up all night last night because of him coughing.  She says that the last cough medicine that you gave her was really good and she would like for him to have some of that before tonight so they both can get some rest.  Please advise.

## 2013-01-18 NOTE — Assessment & Plan Note (Signed)
Would treat given his hx and current sx.  D/w pt about abx options.  Okay for outpatient f/u.  Use SABA in meantime, f/u prn.  He agrees.  Nontoxic.  CBC and imaging wouldn't likely change mgmt today, so this was deferred.

## 2013-01-18 NOTE — Assessment & Plan Note (Signed)
Going on for months.  Wouldn't need to change meds at this point. This doesn't appear to be orthostatic.  He could have had neuro event, but this is not acute.  Would use PT to see how much he can recover.  Consider imaging brain if not improved or worsening.  He agrees.

## 2013-01-18 NOTE — Progress Notes (Signed)
Cough.  Going on for about 3 days.  H/o PNA prev.  Not smoking now.  Discolored sputum with some wheeze.  No fevers.  Some nausea.  No diarrhea.  "I didn't feel right and then the cough got worse."  Is out of inhaler.  No ear pain, ST, edema.   Gait changes.  Had fallen at home over the last few months.  No focal weakness but his gait is much quicker, choppier and stride is shortened.  This is going on since ~12/13, but this is the first I had known about it.  I noted the change as he was leaving and got him back in the exam room to check/discuss.   Meds, vitals, and allergies reviewed.   ROS: See HPI.  Otherwise, noncontributory.  nad but looks like he doesn't feel well Tm wnl Nasal and OP exam wnl Neck supple, no LA rrr Coarse BS but no focal dec or wheeze, no inc in wob abd soft No focal weakness on the BLE and sensation intact but gait is quicker, choppier and stride is shortened compared to prev.  This is bilateral.

## 2013-01-18 NOTE — Patient Instructions (Addendum)
Start the antibiotics today and this should gradually improve.  Use the inhaler if needed.   Take care.

## 2013-01-18 NOTE — Telephone Encounter (Signed)
Please call in.  Sedation and fall caution on the medicine.  Thanks.

## 2013-02-12 ENCOUNTER — Encounter: Payer: Self-pay | Admitting: Family Medicine

## 2013-02-13 ENCOUNTER — Other Ambulatory Visit: Payer: Self-pay | Admitting: *Deleted

## 2013-02-13 MED ORDER — SIMVASTATIN 20 MG PO TABS: ORAL_TABLET | ORAL | Status: DC

## 2013-02-26 ENCOUNTER — Encounter: Payer: Self-pay | Admitting: Family Medicine

## 2013-04-08 ENCOUNTER — Other Ambulatory Visit: Payer: Self-pay | Admitting: *Deleted

## 2013-04-08 MED ORDER — AMLODIPINE BESYLATE 10 MG PO TABS: 10.0000 mg | ORAL_TABLET | Freq: Every day | ORAL | Status: DC

## 2013-04-08 MED ORDER — LISINOPRIL 40 MG PO TABS: ORAL_TABLET | ORAL | Status: DC

## 2013-04-15 ENCOUNTER — Other Ambulatory Visit: Payer: Self-pay | Admitting: *Deleted

## 2013-04-15 MED ORDER — HYDROCHLOROTHIAZIDE 25 MG PO TABS: 25.0000 mg | ORAL_TABLET | Freq: Every day | ORAL | Status: DC

## 2013-06-07 ENCOUNTER — Ambulatory Visit (INDEPENDENT_AMBULATORY_CARE_PROVIDER_SITE_OTHER): Payer: Medicare Other

## 2013-06-07 DIAGNOSIS — Z23 Encounter for immunization: Secondary | ICD-10-CM

## 2013-06-24 ENCOUNTER — Telehealth: Payer: Self-pay

## 2013-06-24 MED ORDER — LISINOPRIL 20 MG PO TABS: 20.0000 mg | ORAL_TABLET | Freq: Every day | ORAL | Status: DC

## 2013-06-24 NOTE — Telephone Encounter (Signed)
Pt request lisinopril 40 mg taking 1/2 daily changed to lisinopril 20 mg taking one daily and sent to [facility]. Pt does not want to split pills.Please advise.

## 2013-06-24 NOTE — Telephone Encounter (Signed)
Sent!

## 2013-07-02 ENCOUNTER — Other Ambulatory Visit: Payer: Self-pay | Admitting: *Deleted

## 2013-10-09 ENCOUNTER — Other Ambulatory Visit: Payer: Self-pay

## 2013-10-09 MED ORDER — HYDROCHLOROTHIAZIDE 25 MG PO TABS: 25.0000 mg | ORAL_TABLET | Freq: Every day | ORAL | Status: DC

## 2013-10-09 MED ORDER — AMLODIPINE BESYLATE 10 MG PO TABS: 10.0000 mg | ORAL_TABLET | Freq: Every day | ORAL | Status: DC

## 2013-10-09 NOTE — Telephone Encounter (Signed)
Send, schedule a CPE.  Thanks.

## 2013-10-09 NOTE — Telephone Encounter (Signed)
Last prescribed 04/08/13 90 day with 1 refill--last OV 12/2012--please advise

## 2013-10-09 NOTE — Telephone Encounter (Signed)
Or does pt need an OV

## 2013-10-10 MED ORDER — HYDROCHLOROTHIAZIDE 25 MG PO TABS: 25.0000 mg | ORAL_TABLET | Freq: Every day | ORAL | Status: DC

## 2013-10-10 MED ORDER — AMLODIPINE BESYLATE 10 MG PO TABS: 10.0000 mg | ORAL_TABLET | Freq: Every day | ORAL | Status: DC

## 2013-11-06 ENCOUNTER — Other Ambulatory Visit: Payer: Self-pay | Admitting: Family Medicine

## 2013-11-06 DIAGNOSIS — E538 Deficiency of other specified B group vitamins: Secondary | ICD-10-CM

## 2013-11-06 DIAGNOSIS — E7800 Pure hypercholesterolemia, unspecified: Secondary | ICD-10-CM

## 2013-11-08 ENCOUNTER — Other Ambulatory Visit (INDEPENDENT_AMBULATORY_CARE_PROVIDER_SITE_OTHER): Payer: Medicare Other

## 2013-11-08 DIAGNOSIS — E538 Deficiency of other specified B group vitamins: Secondary | ICD-10-CM

## 2013-11-08 DIAGNOSIS — E7800 Pure hypercholesterolemia, unspecified: Secondary | ICD-10-CM

## 2013-11-08 LAB — LIPID PANEL
CHOLESTEROL: 160 mg/dL (ref 0–200)
HDL: 49.3 mg/dL (ref >39.00)
LDL CALC: 96 mg/dL (ref 0–99)
TRIGLYCERIDES: 74 mg/dL (ref 0.0–149.0)
Total CHOL/HDL Ratio: 3
VLDL: 14.8 mg/dL (ref 0.0–40.0)

## 2013-11-08 LAB — COMPREHENSIVE METABOLIC PANEL
ALBUMIN: 3.9 g/dL (ref 3.5–5.2)
ALT: 17 U/L (ref 0–53)
AST: 24 U/L (ref 0–37)
Alkaline Phosphatase: 92 U/L (ref 39–117)
BILIRUBIN TOTAL: 0.5 mg/dL (ref 0.3–1.2)
BUN: 21 mg/dL (ref 6–23)
CO2: 37 mEq/L — ABNORMAL HIGH (ref 19–32)
Calcium: 9.6 mg/dL (ref 8.4–10.5)
Chloride: 97 mEq/L (ref 96–112)
Creatinine, Ser: 1.3 mg/dL (ref 0.4–1.5)
GFR: 55.52 mL/min — AB (ref >60.00)
GLUCOSE: 98 mg/dL (ref 70–99)
Potassium: 4.4 mEq/L (ref 3.5–5.1)
Sodium: 138 mEq/L (ref 135–145)
Total Protein: 7.5 g/dL (ref 6.0–8.3)

## 2013-11-08 LAB — VITAMIN B12: Vitamin B-12: 227 pg/mL (ref 211–911)

## 2013-11-11 ENCOUNTER — Ambulatory Visit (INDEPENDENT_AMBULATORY_CARE_PROVIDER_SITE_OTHER): Payer: Medicare Other | Admitting: Family Medicine

## 2013-11-11 ENCOUNTER — Encounter: Payer: Self-pay | Admitting: Family Medicine

## 2013-11-11 VITALS — BP 158/82 | HR 72 | Temp 97.9°F | Ht 66.0 in | Wt 146.5 lb

## 2013-11-11 DIAGNOSIS — Z0000 Encounter for general adult medical examination without abnormal findings: Secondary | ICD-10-CM

## 2013-11-11 DIAGNOSIS — K209 Esophagitis, unspecified without bleeding: Secondary | ICD-10-CM

## 2013-11-11 DIAGNOSIS — E538 Deficiency of other specified B group vitamins: Secondary | ICD-10-CM

## 2013-11-11 DIAGNOSIS — I10 Essential (primary) hypertension: Secondary | ICD-10-CM

## 2013-11-11 DIAGNOSIS — E7800 Pure hypercholesterolemia, unspecified: Secondary | ICD-10-CM

## 2013-11-11 NOTE — Patient Instructions (Signed)
Don't change your meds for now.  Check this med list and make sure you don't have any dose changes.  Glad to see you. Recheck in 1 year, sooner if needed.

## 2013-11-11 NOTE — Progress Notes (Signed)
Pre visit review using our clinic review tool, if applicable. No additional management support is needed unless otherwise documented below in the visit note.  I have personally reviewed the Medicare Annual Wellness questionnaire and have noted 1. The patient's medical and social history 2. Their use of alcohol, tobacco or illicit drugs 3. Their current medications and supplements 4. The patient's functional ability including ADL's, fall risks, home safety risks and hearing or visual             impairment. 5. Diet and physical activities 6. Evidence for depression or mood disorders  The patients weight, height, BMI have been recorded in the chart and visual acuity is per eye clinic.  I have made referrals, counseling and provided education to the patient based review of the above and I have provided the pt with a written personalized care plan for preventive services.  See scanned forms.  Routine anticipatory guidance given to patient.  See health maintenance. Flu 2014 Shingles 2012 PNA 1999 Tetanus 2013 Colonoscopy 2013 Prostate cancer screening not indicated, d/w pt.  He agrees.  Advance directive- wife designated if patient is incapacitated.  Cognitive function addressed- see scanned forms- and if abnormal then additional documentation follows.   Hypertension:    Using medication without problems or lightheadedness: yes Chest pain with exertion:no Edema:no Short of breath:no  Elevated Cholesterol: Using medications without problems:yes Muscle aches: no Diet compliance:yes Exercise:some  GERD. Controlled with PPI.  No ADE.  Feels well.   PMH and SH reviewed  Meds, vitals, and allergies reviewed.   ROS: See HPI.  Otherwise negative.    GEN: nad, alert and oriented HEENT: mucous membranes moist NECK: supple w/o LA CV: rrr. PULM: ctab, no inc wob ABD: soft, +bs EXT: no edema SKIN: no acute rash but actinic changes noted on the scalp.

## 2013-11-12 NOTE — Assessment & Plan Note (Signed)
Would continue as is, I don't want to induce hypotension.

## 2013-11-12 NOTE — Assessment & Plan Note (Signed)
Reasonable level at this point off replacement.

## 2013-11-12 NOTE — Assessment & Plan Note (Signed)
Controlled, continue as is.  Labs d/w pt.  

## 2013-11-12 NOTE — Assessment & Plan Note (Signed)
Controlled with PPI. Continue.

## 2013-11-12 NOTE — Assessment & Plan Note (Signed)
See scanned forms.  Routine anticipatory guidance given to patient.  See health maintenance. Flu 2014 Shingles 2012 PNA 1999 Tetanus 2013 Colonoscopy 2013 Prostate cancer screening not indicated, d/w pt.  He agrees.  Advance directive- wife designated if patient is incapacitated.  Cognitive function addressed- see scanned forms- and if abnormal then additional documentation follows.

## 2013-12-10 ENCOUNTER — Telehealth: Payer: Self-pay | Admitting: Internal Medicine

## 2013-12-10 ENCOUNTER — Encounter: Payer: Self-pay | Admitting: Physician Assistant

## 2013-12-10 ENCOUNTER — Ambulatory Visit (INDEPENDENT_AMBULATORY_CARE_PROVIDER_SITE_OTHER): Payer: Medicare Other | Admitting: Physician Assistant

## 2013-12-10 VITALS — BP 148/80 | HR 92 | Ht 66.0 in | Wt 143.8 lb

## 2013-12-10 DIAGNOSIS — Z8719 Personal history of other diseases of the digestive system: Secondary | ICD-10-CM

## 2013-12-10 DIAGNOSIS — R1310 Dysphagia, unspecified: Secondary | ICD-10-CM

## 2013-12-10 NOTE — Telephone Encounter (Signed)
Pt having difficulty swallowing. Pt had esophageal dilation in 2013 with Dr. Henrene Pastor. Pt scheduled to see Nicoletta Ba PA today at 2pm. Pt aware of appt.

## 2013-12-10 NOTE — Progress Notes (Signed)
Subjective:    Patient ID: [name], male    DOB: [birth year], 80 y.o.   MRN: [id number]  HPI  [name]  is a very nice 80 year old white male known to Dr. [name] who comes in today with complaints of difficulty swallowing. He did undergo upper endoscopy in February 2013 was found to have esophagitis and a ringlike stricture in the distal esophagus measuring 14 mm. He was dilated to [place] . Patient says this procedure did help the swallowing quite a bit, but over the past 3-4 months he has had recurrent symptoms. He says he's had some progression over the past month. He is not having trouble with every meal or even every day but says his symptoms have become more frequent. He has had a couple episodes of dysphagia requiring regurgitation and says frequently has to stop eating to allow food time to pass. Says sometimes with these episodes he gets somewhat nauseated. He denies any abdominal pain no odynophagia. His appetite has been fine, and his weight has been stable. He does have some chronic problems with mild dyspnea, does carry a diagnosis of COPD. Also with history of hypertension, hyperlipidemia and a previous TIA.    Review of Systems  Constitutional: Negative.   HENT: Positive for trouble swallowing.   Eyes: Negative.   Respiratory: Positive for shortness of breath.   Cardiovascular: Negative.   Gastrointestinal: Negative.   Endocrine: Negative.   Genitourinary: Negative.   Musculoskeletal: Negative.   Skin: Negative.   Allergic/Immunologic: Negative.   Neurological: Negative.   Psychiatric/Behavioral: Negative.    Outpatient Prescriptions Prior to Visit  Medication Sig Dispense Refill  . amLODipine (NORVASC) 10 MG tablet Take 1 tablet (10 mg total) by mouth daily.  90 tablet  1  . aspirin EC 81 MG tablet Take 81 mg by mouth daily.      . fluorouracil (EFUDEX) 5 % cream       . hydrochlorothiazide (HYDRODIURIL) 25 MG tablet Take 1 tablet (25 mg total) by  mouth daily.  90 tablet  1  . lisinopril (PRINIVIL,ZESTRIL) 20 MG tablet Take 1 tablet (20 mg total) by mouth daily.  90 tablet  3  . omeprazole (PRILOSEC) 20 MG capsule Take 20 mg by mouth daily.      . simvastatin (ZOCOR) 20 MG tablet Take 1/2 tab by mouth at night  90 tablet  1   No facility-administered medications prior to visit.   No Known Allergies Patient Active Problem List   Diagnosis Date Noted  . Abnormality of gait 01/18/2013  . Cough 01/18/2013  . Medicare annual wellness visit, subsequent 05/31/2012  . Esophagitis 10/26/2011  . Chest wall pain 08/30/2011  . TIA (transient ischemic attack) 03/23/2011  . B12 deficiency 03/23/2011  . OTHER DYSPHAGIA 10/28/2010  . Other urinary incontinence 10/28/2010  . TOBACCO ABUSE 04/26/2010  . PNEUMONIA, BILATERAL 02/08/2009  . HYPERCHOLESTEROLEMIA 07/14/2008  . HYPERTENSION 07/14/2008  . ABDOMINAL BRUIT 07/14/2008   History  Substance Use Topics  . Smoking status: Former Smoker    Types: Cigarettes    Quit date: 08/29/1985  . Smokeless tobacco: Former Systems developer    Types: Chew  . Alcohol Use: No   family history includes Heart attack in his father; Hypertension in his mother. There is no history of Colon cancer or Esophageal cancer.     Objective:   Physical Exam  Well-developed elderly white male in no acute distress, pleasant blood pressure 148/80 pulse 92 height 5 foot  6 weight 143. HEENT; nontraumatic normocephalic EOMI PERRLA sclera anicteric, Supple; no JVD, Cardiovascular; regular rate and rhythm with S1-S2 no murmur or gallop, Pulmonary; decreased breath sounds bilaterally no wheezes, Abdomen; soft nontender nondistended bowel sounds are present, Rectal; exam not done, Extremities; no clubbing cyanosis or edema skin warm and dry, Psych; mood and affect appropriate       Assessment & Plan:  #60 80 year old male with history of a distal esophageal stricture requiring dilation in February 2013 now with recurrent solid food  dysphagia x3-4 months gradually progressive. Symptoms are most consistent with a recurrent distal stricture #2 hypertension #3 COPD #4 history of a TIA   Plan; Patient will continue Prilosec 20 mg by mouth every morning Reviewed  soft diet, avoidance of meat, and careful chewing. Will schedule for EGD with probable [place] dilation with Dr. Henrene Pastor. Procedure was discussed in detail with the patient and he is agreeable to proceed.

## 2013-12-10 NOTE — Patient Instructions (Signed)

## 2013-12-11 NOTE — Progress Notes (Signed)
Agree with initial assessment and plans 

## 2013-12-12 ENCOUNTER — Encounter (HOSPITAL_COMMUNITY): Payer: Self-pay | Admitting: Emergency Medicine

## 2013-12-12 ENCOUNTER — Telehealth: Payer: Self-pay | Admitting: Internal Medicine

## 2013-12-12 ENCOUNTER — Inpatient Hospital Stay (HOSPITAL_COMMUNITY)
Admission: EM | Admit: 2013-12-12 | Discharge: 2013-12-20 | DRG: 871 | Disposition: A | Discharge status: Home Health | Payer: Medicare Other | Attending: Internal Medicine | Admitting: Internal Medicine

## 2013-12-12 ENCOUNTER — Emergency Department (HOSPITAL_COMMUNITY): Payer: Medicare Other

## 2013-12-12 DIAGNOSIS — N179 Acute kidney failure, unspecified: Secondary | ICD-10-CM | POA: Diagnosis not present

## 2013-12-12 DIAGNOSIS — K209 Esophagitis, unspecified without bleeding: Secondary | ICD-10-CM

## 2013-12-12 DIAGNOSIS — D500 Iron deficiency anemia secondary to blood loss (chronic): Secondary | ICD-10-CM | POA: Diagnosis present

## 2013-12-12 DIAGNOSIS — R059 Cough, unspecified: Secondary | ICD-10-CM

## 2013-12-12 DIAGNOSIS — E876 Hypokalemia: Secondary | ICD-10-CM | POA: Diagnosis present

## 2013-12-12 DIAGNOSIS — R0902 Hypoxemia: Secondary | ICD-10-CM

## 2013-12-12 DIAGNOSIS — K219 Gastro-esophageal reflux disease without esophagitis: Secondary | ICD-10-CM | POA: Diagnosis present

## 2013-12-12 DIAGNOSIS — A419 Sepsis, unspecified organism: Principal | ICD-10-CM | POA: Diagnosis present

## 2013-12-12 DIAGNOSIS — R05 Cough: Secondary | ICD-10-CM

## 2013-12-12 DIAGNOSIS — Z7982 Long term (current) use of aspirin: Secondary | ICD-10-CM

## 2013-12-12 DIAGNOSIS — I10 Essential (primary) hypertension: Secondary | ICD-10-CM | POA: Diagnosis present

## 2013-12-12 DIAGNOSIS — R0789 Other chest pain: Secondary | ICD-10-CM

## 2013-12-12 DIAGNOSIS — J449 Chronic obstructive pulmonary disease, unspecified: Secondary | ICD-10-CM | POA: Diagnosis present

## 2013-12-12 DIAGNOSIS — I509 Heart failure, unspecified: Secondary | ICD-10-CM | POA: Diagnosis present

## 2013-12-12 DIAGNOSIS — E785 Hyperlipidemia, unspecified: Secondary | ICD-10-CM | POA: Diagnosis present

## 2013-12-12 DIAGNOSIS — Z87891 Personal history of nicotine dependence: Secondary | ICD-10-CM

## 2013-12-12 DIAGNOSIS — K222 Esophageal obstruction: Secondary | ICD-10-CM | POA: Diagnosis present

## 2013-12-12 DIAGNOSIS — Z8673 Personal history of transient ischemic attack (TIA), and cerebral infarction without residual deficits: Secondary | ICD-10-CM

## 2013-12-12 DIAGNOSIS — R269 Unspecified abnormalities of gait and mobility: Secondary | ICD-10-CM

## 2013-12-12 DIAGNOSIS — Z66 Do not resuscitate: Secondary | ICD-10-CM | POA: Diagnosis present

## 2013-12-12 DIAGNOSIS — Z538 Procedure and treatment not carried out for other reasons: Secondary | ICD-10-CM

## 2013-12-12 DIAGNOSIS — J189 Pneumonia, unspecified organism: Secondary | ICD-10-CM | POA: Diagnosis present

## 2013-12-12 DIAGNOSIS — D62 Acute posthemorrhagic anemia: Secondary | ICD-10-CM | POA: Diagnosis present

## 2013-12-12 DIAGNOSIS — J45909 Unspecified asthma, uncomplicated: Secondary | ICD-10-CM | POA: Diagnosis present

## 2013-12-12 DIAGNOSIS — J9600 Acute respiratory failure, unspecified whether with hypoxia or hypercapnia: Secondary | ICD-10-CM | POA: Diagnosis present

## 2013-12-12 DIAGNOSIS — R1310 Dysphagia, unspecified: Secondary | ICD-10-CM

## 2013-12-12 DIAGNOSIS — Z8249 Family history of ischemic heart disease and other diseases of the circulatory system: Secondary | ICD-10-CM

## 2013-12-12 DIAGNOSIS — Z79899 Other long term (current) drug therapy: Secondary | ICD-10-CM

## 2013-12-12 DIAGNOSIS — J4489 Other specified chronic obstructive pulmonary disease: Secondary | ICD-10-CM | POA: Diagnosis present

## 2013-12-12 DIAGNOSIS — I5033 Acute on chronic diastolic (congestive) heart failure: Secondary | ICD-10-CM | POA: Diagnosis present

## 2013-12-12 DIAGNOSIS — E538 Deficiency of other specified B group vitamins: Secondary | ICD-10-CM | POA: Diagnosis present

## 2013-12-12 DIAGNOSIS — R0600 Dyspnea, unspecified: Secondary | ICD-10-CM

## 2013-12-12 LAB — COMPREHENSIVE METABOLIC PANEL
ALT: 32 U/L (ref 0–53)
AST: 36 U/L (ref 0–37)
Albumin: 2.5 g/dL — ABNORMAL LOW (ref 3.5–5.2)
Alkaline Phosphatase: 110 U/L (ref 39–117)
BUN: 26 mg/dL — AB (ref 6–23)
CALCIUM: 9 mg/dL (ref 8.4–10.5)
CO2: 29 meq/L (ref 19–32)
Chloride: 88 mEq/L — ABNORMAL LOW (ref 96–112)
Creatinine, Ser: 1.16 mg/dL (ref 0.50–1.35)
GFR, EST AFRICAN AMERICAN: 64 mL/min — AB (ref >90)
GFR, EST NON AFRICAN AMERICAN: 55 mL/min — AB (ref >90)
GLUCOSE: 115 mg/dL — AB (ref 70–99)
Potassium: 3.3 mEq/L — ABNORMAL LOW (ref 3.7–5.3)
SODIUM: 132 meq/L — AB (ref 137–147)
Total Bilirubin: 0.7 mg/dL (ref 0.3–1.2)
Total Protein: 7.1 g/dL (ref 6.0–8.3)

## 2013-12-12 LAB — CBC
HEMATOCRIT: 33.8 % — AB (ref 39.0–52.0)
HEMOGLOBIN: 11.4 g/dL — AB (ref 13.0–17.0)
MCH: 27.2 pg (ref 26.0–34.0)
MCHC: 33.7 g/dL (ref 30.0–36.0)
MCV: 80.7 fL (ref 78.0–100.0)
Platelets: 349 10*3/uL (ref 150–400)
RBC: 4.19 MIL/uL — ABNORMAL LOW (ref 4.22–5.81)
RDW: 13.9 % (ref 11.5–15.5)
WBC: 17.1 10*3/uL — ABNORMAL HIGH (ref 4.0–10.5)

## 2013-12-12 LAB — URINE MICROSCOPIC-ADD ON

## 2013-12-12 LAB — URINALYSIS, ROUTINE W REFLEX MICROSCOPIC
Bilirubin Urine: NEGATIVE
Glucose, UA: NEGATIVE mg/dL
Ketones, ur: NEGATIVE mg/dL
Leukocytes, UA: NEGATIVE
Nitrite: NEGATIVE
PROTEIN: 100 mg/dL — AB
Specific Gravity, Urine: 1.019 (ref 1.005–1.030)
UROBILINOGEN UA: 1 mg/dL (ref 0.0–1.0)
pH: 5.5 (ref 5.0–8.0)

## 2013-12-12 LAB — PRO B NATRIURETIC PEPTIDE: Pro B Natriuretic peptide (BNP): 7026 pg/mL — ABNORMAL HIGH (ref 0–450)

## 2013-12-12 LAB — STREP PNEUMONIAE URINARY ANTIGEN: Strep Pneumo Urinary Antigen: NEGATIVE

## 2013-12-12 LAB — LACTIC ACID, PLASMA: LACTIC ACID, VENOUS: 1.4 mmol/L (ref 0.5–2.2)

## 2013-12-12 LAB — TROPONIN I: Troponin I: <0.3 ng/mL (ref <0.30)

## 2013-12-12 MED ORDER — ALBUTEROL SULFATE (2.5 MG/3ML) 0.083% IN NEBU
5.0000 mg | INHALATION_SOLUTION | Freq: Once | RESPIRATORY_TRACT | Status: AC
  Administered 2013-12-12: 5 mg via RESPIRATORY_TRACT
  Filled 2013-12-12: qty 6

## 2013-12-12 MED ORDER — SIMVASTATIN 20 MG PO TABS
20.0000 mg | ORAL_TABLET | Freq: Every day | ORAL | Status: DC
  Administered 2013-12-12 – 2013-12-19 (×8): 20 mg via ORAL
  Filled 2013-12-12 (×9): qty 1

## 2013-12-12 MED ORDER — VANCOMYCIN HCL IN DEXTROSE 750-5 MG/150ML-% IV SOLN
750.0000 mg | Freq: Once | INTRAVENOUS | Status: AC
  Administered 2013-12-12: 750 mg via INTRAVENOUS
  Filled 2013-12-12: qty 150

## 2013-12-12 MED ORDER — ACETAMINOPHEN 325 MG PO TABS
650.0000 mg | ORAL_TABLET | Freq: Four times a day (QID) | ORAL | Status: DC | PRN
Start: 2013-12-12 — End: 2013-12-20

## 2013-12-12 MED ORDER — ENOXAPARIN SODIUM 40 MG/0.4ML ~~LOC~~ SOLN
40.0000 mg | SUBCUTANEOUS | Status: DC
  Administered 2013-12-12 – 2013-12-19 (×8): 40 mg via SUBCUTANEOUS
  Filled 2013-12-12 (×9): qty 0.4

## 2013-12-12 MED ORDER — VANCOMYCIN HCL 500 MG IV SOLR
500.0000 mg | Freq: Two times a day (BID) | INTRAVENOUS | Status: DC
  Administered 2013-12-13 – 2013-12-15 (×5): 500 mg via INTRAVENOUS
  Filled 2013-12-12 (×5): qty 500

## 2013-12-12 MED ORDER — ALBUTEROL SULFATE (2.5 MG/3ML) 0.083% IN NEBU
2.5000 mg | INHALATION_SOLUTION | RESPIRATORY_TRACT | Status: DC | PRN
  Administered 2013-12-16: 2.5 mg via RESPIRATORY_TRACT
  Filled 2013-12-12: qty 3

## 2013-12-12 MED ORDER — SODIUM CHLORIDE 0.9 % IV BOLUS (SEPSIS): 500.0000 mL | INTRAVENOUS | Status: DC | PRN

## 2013-12-12 MED ORDER — SODIUM CHLORIDE 0.9 % IV BOLUS (SEPSIS): 500.0000 mL | Freq: Once | INTRAVENOUS | Status: DC

## 2013-12-12 MED ORDER — ASPIRIN EC 81 MG PO TBEC
81.0000 mg | DELAYED_RELEASE_TABLET | Freq: Every day | ORAL | Status: DC
  Administered 2013-12-12 – 2013-12-20 (×9): 81 mg via ORAL
  Filled 2013-12-12 (×9): qty 1

## 2013-12-12 MED ORDER — ONDANSETRON HCL 4 MG/2ML IJ SOLN
4.0000 mg | Freq: Four times a day (QID) | INTRAMUSCULAR | Status: DC | PRN
  Filled 2013-12-12: qty 2

## 2013-12-12 MED ORDER — DEXTROSE 5 % IV SOLN: 500.0000 mg | INTRAVENOUS | Status: DC

## 2013-12-12 MED ORDER — DEXTROSE 5 % IV SOLN
1.0000 g | INTRAVENOUS | Status: DC
  Administered 2013-12-12: 1 g via INTRAVENOUS
  Filled 2013-12-12: qty 10

## 2013-12-12 MED ORDER — POTASSIUM CHLORIDE IN NACL 20-0.9 MEQ/L-% IV SOLN
INTRAVENOUS | Status: DC
  Administered 2013-12-12: 19:00:00 via INTRAVENOUS
  Filled 2013-12-12 (×3): qty 1000

## 2013-12-12 MED ORDER — ONDANSETRON HCL 4 MG PO TABS: 4.0000 mg | ORAL_TABLET | Freq: Four times a day (QID) | ORAL | Status: DC | PRN

## 2013-12-12 MED ORDER — ACETAMINOPHEN 650 MG RE SUPP: 650.0000 mg | Freq: Four times a day (QID) | RECTAL | Status: DC | PRN

## 2013-12-12 MED ORDER — SODIUM CHLORIDE 0.9 % IJ SOLN
3.0000 mL | Freq: Two times a day (BID) | INTRAMUSCULAR | Status: DC
  Administered 2013-12-15 – 2013-12-18 (×3): 3 mL via INTRAVENOUS

## 2013-12-12 MED ORDER — SODIUM CHLORIDE 0.9 % IV SOLN
INTRAVENOUS | Status: DC
  Administered 2013-12-12: 14:00:00 via INTRAVENOUS

## 2013-12-12 MED ORDER — SODIUM CHLORIDE 0.9 % IV SOLN
INTRAVENOUS | Status: AC
  Administered 2013-12-12: 17:00:00 via INTRAVENOUS

## 2013-12-12 MED ORDER — PANTOPRAZOLE SODIUM 40 MG PO TBEC
40.0000 mg | DELAYED_RELEASE_TABLET | Freq: Every day | ORAL | Status: DC
  Administered 2013-12-12 – 2013-12-20 (×9): 40 mg via ORAL
  Filled 2013-12-12 (×9): qty 1

## 2013-12-12 MED ORDER — PIPERACILLIN-TAZOBACTAM 3.375 G IVPB
3.3750 g | Freq: Three times a day (TID) | INTRAVENOUS | Status: DC
  Administered 2013-12-12 – 2013-12-19 (×21): 3.375 g via INTRAVENOUS
  Filled 2013-12-12 (×22): qty 50

## 2013-12-12 NOTE — Progress Notes (Signed)
ANTIBIOTIC CONSULT NOTE - INITIAL  Pharmacy Consult for Vancomycin and Zosyn Indication: rule out sepsis  No Known Allergies  Patient Measurements:   As of 12/11/13: 65.2kg, 66in  Vital Signs: Temp: 99 F (37.2 C) (04/16 1337) Temp src: Oral (04/16 1337) BP: 120/55 mmHg (04/16 1415) Pulse Rate: 61 (04/16 1415) Intake/Output from previous day:   Intake/Output from this shift:    Labs:  Recent Labs  12/12/13 1344  WBC 17.1*  HGB 11.4*  PLT 349  CREATININE 1.16   The CrCl is unknown because both a height and weight (above a minimum accepted value) are required for this calculation. CrCl 45 ml/min/1.23m2 (normalized) No results found for this basename: VANCOTROUGH, VANCOPEAK, VANCORANDOM, GENTTROUGH, GENTPEAK, GENTRANDOM, TOBRATROUGH, TOBRAPEAK, TOBRARND, AMIKACINPEAK, AMIKACINTROU, AMIKACIN,  in the last 72 hours   Microbiology: No results found for this or any previous visit (from the past 720 hour(s)).  4/16 blood x2: pending  Medical History: Past Medical History  Diagnosis Date  . Hypertension   . Hyperlipidemia   . Tobacco abuse   . Lung nodule     Left lung, seen 2012, no change in 2012, no follow up needed   . PNA (pneumonia) 2010    Bilateral Pneumonia, COPD 47mm LLL Nodule   . History of ETT 09/1991     POS   . History of MRI 04-22-06    L/S- right hnp  l5/si   . B12 deficiency   . TIA (transient ischemic attack)   . GERD (gastroesophageal reflux disease)     Medications:  Scheduled:  . sodium chloride   Intravenous STAT  . aspirin EC  81 mg Oral Daily  . enoxaparin (LOVENOX) injection  40 mg Subcutaneous Q24H  . pantoprazole  40 mg Oral Daily  . simvastatin  20 mg Oral q1800  . sodium chloride  500 mL Intravenous Once  . sodium chloride  3 mL Intravenous Q12H   Infusions:  . sodium chloride 20 mL/hr at 12/12/13 1423  . 0.9 % NaCl with KCl 20 mEq / L     Assessment: 80 yo male presents 4/16 with SOB, cough, fever and chills for several  days, reporting that wife had similar symptoms. CXR shows bilateral PNA; initially given 1 dose of Rocephin for presumed CAP, but MD now changing to Vancomycin and Zosyn for presumed sepsis with possible aspiration.  Goal of Therapy:  Vancomycin trough level 15-20 mcg/ml Zosyn dose per renal function  Plan:   Vancomycin 750mg  IV x 1, then 500mg  IV q12h Check trough at steady state Zosyn 3.375gm IV q8h (4hr extended infusions) Follow up renal function & cultures, de-escalation of therapy   Peggyann Juba, PharmD, BCPS Pager: [phone] 12/12/2013,4:43 PM

## 2013-12-12 NOTE — Telephone Encounter (Signed)
Called pts wife and she states her husband was very congested and running a fever. States her sister took the pt to the ER at [facility] to be evaluated.

## 2013-12-12 NOTE — ED Notes (Signed)
Patient transported to X-ray 

## 2013-12-12 NOTE — ED Provider Notes (Signed)
CSN: [id number]     Arrival date & time 12/12/13  1318 History   First MD Initiated Contact with Patient 12/12/13 1328     Chief Complaint  Patient presents with  . Fever  . congestion      (Consider location/radiation/quality/duration/timing/severity/associated sxs/prior Treatment) Patient is a 80 y.o. male presenting with fever. The history is provided by the patient and the spouse.  Fever Associated symptoms: cough   Associated symptoms: no chest pain, no confusion, no diarrhea, no dysuria, no headaches, no rash, no sore throat and no vomiting   pt w hx htn, c/o dyspnea in the past 1-2 weeks, getting progressively worse. Increased non productive cough, episodic. +general weakness, general malaise. Denies sore throat or runny nose. No body aches. Also has long standing dysphagia, and intermittently chokes/gags w eating.  States had egd approximately 3 years ago w 'esophageal stretching', states is due to have again in coming month.  Denies chest pain. No fever or chills. No leg pain or swelling. No hx dvt or pe. No orthopnea or pnd. Denies hx chf. States compliant w normal meds.      Past Medical History  Diagnosis Date  . Hypertension   . Hyperlipidemia   . Tobacco abuse   . Lung nodule     Left lung, seen 2012, no change in 2012, no follow up needed   . PNA (pneumonia) 2010    Bilateral Pneumonia, COPD 88mm LLL Nodule   . History of ETT 09/1991     POS   . History of MRI 04-22-06    L/S- right hnp  l5/si   . B12 deficiency   . TIA (transient ischemic attack)   . GERD (gastroesophageal reflux disease)    Past Surgical History  Procedure Laterality Date  . Cystectomy  1995    Lumbar area    Family History  Problem Relation Age of Onset  . Hypertension Mother   . Heart attack Father   . Colon cancer Neg Hx   . Esophageal cancer Neg Hx    History  Substance Use Topics  . Smoking status: Former Smoker    Types: Cigarettes    Quit date: 08/29/1985  . Smokeless  tobacco: Former Systems developer    Types: Chew  . Alcohol Use: No    Review of Systems  Constitutional: Positive for fever.  HENT: Negative for sore throat.   Eyes: Negative for redness.  Respiratory: Positive for cough and shortness of breath.   Cardiovascular: Negative for chest pain and leg swelling.  Gastrointestinal: Negative for vomiting, abdominal pain and diarrhea.  Genitourinary: Negative for dysuria and flank pain.  Musculoskeletal: Negative for back pain and neck pain.  Skin: Negative for rash.  Neurological: Negative for headaches.  Hematological: Does not bruise/bleed easily.  Psychiatric/Behavioral: Negative for confusion.      Allergies  Review of patient's allergies indicates no known allergies.  Home Medications   Prior to Admission medications   Medication Sig Start Date End Date Taking? Authorizing Provider  amLODipine (NORVASC) 10 MG tablet Take 1 tablet (10 mg total) by mouth daily. 10/10/13   Tonia Ghent, MD  aspirin EC 81 MG tablet Take 81 mg by mouth daily.    Historical Provider, MD  fluorouracil (EFUDEX) 5 % cream  08/04/11   Historical Provider, MD  hydrochlorothiazide (HYDRODIURIL) 25 MG tablet Take 1 tablet (25 mg total) by mouth daily. 10/10/13   Tonia Ghent, MD  lisinopril (PRINIVIL,ZESTRIL) 20 MG tablet Take 1 tablet (  20 mg total) by mouth daily. 06/24/13   Tonia Ghent, MD  omeprazole (PRILOSEC) 20 MG capsule Take 20 mg by mouth daily.    Historical Provider, MD  simvastatin (ZOCOR) 20 MG tablet Take 1/2 tab by mouth at night 02/13/13   Tonia Ghent, MD   BP 145/65  Pulse 66  Temp(Src) 99 F (37.2 C) (Oral)  Resp 20  SpO2 94% Physical Exam  Nursing note and vitals reviewed. Constitutional: He is oriented to person, place, and time.  Elderly, frail appearing.   HENT:  Head: Atraumatic.  Mouth/Throat: Oropharynx is clear and moist.  Eyes: Conjunctivae are normal. Pupils are equal, round, and reactive to light. No scleral icterus.  Neck:  Normal range of motion. Neck supple. No tracheal deviation present.  Cardiovascular: Regular rhythm, normal heart sounds and intact distal pulses.  Exam reveals no gallop and no friction rub.   No murmur heard. tachycardia  Pulmonary/Chest: Effort normal and breath sounds normal. No accessory muscle usage. No respiratory distress.  Abdominal: Soft. Bowel sounds are normal. He exhibits no distension and no mass. There is no tenderness. There is no rebound and no guarding.  Genitourinary:  No cva tenderness  Musculoskeletal: Normal range of motion. He exhibits no edema and no tenderness.  Neurological: He is alert and oriented to person, place, and time.  Skin: Skin is warm and dry.  Psychiatric: He has a normal mood and affect.    ED Course  Procedures (including critical care time)   Results for orders placed during the hospital encounter of 12/12/13  CBC      Result Value Ref Range   WBC 17.1 (*) 4.0 - 10.5 K/uL   RBC 4.19 (*) 4.22 - 5.81 MIL/uL   Hemoglobin 11.4 (*) 13.0 - 17.0 g/dL   HCT 33.8 (*) 39.0 - 52.0 %   MCV 80.7  78.0 - 100.0 fL   MCH 27.2  26.0 - 34.0 pg   MCHC 33.7  30.0 - 36.0 g/dL   RDW 13.9  11.5 - 15.5 %   Platelets 349  150 - 400 K/uL  COMPREHENSIVE METABOLIC PANEL      Result Value Ref Range   Sodium 132 (*) 137 - 147 mEq/L   Potassium 3.3 (*) 3.7 - 5.3 mEq/L   Chloride 88 (*) 96 - 112 mEq/L   CO2 29  19 - 32 mEq/L   Glucose, Bld 115 (*) 70 - 99 mg/dL   BUN 26 (*) 6 - 23 mg/dL   Creatinine, Ser 1.16  0.50 - 1.35 mg/dL   Calcium 9.0  8.4 - 10.5 mg/dL   Total Protein 7.1  6.0 - 8.3 g/dL   Albumin 2.5 (*) 3.5 - 5.2 g/dL   AST 36  0 - 37 U/L   ALT 32  0 - 53 U/L   Alkaline Phosphatase 110  39 - 117 U/L   Total Bilirubin 0.7  0.3 - 1.2 mg/dL   GFR calc non Af Amer 55 (*) >90 mL/min   GFR calc Af Amer 64 (*) >90 mL/min  TROPONIN I      Result Value Ref Range   Troponin I <0.30  <0.30 ng/mL  LACTIC ACID, PLASMA      Result Value Ref Range   Lactic  Acid, Venous 1.4  0.5 - 2.2 mmol/L   Dg Chest 2 View  12/12/2013   CLINICAL DATA:  Shortness of breath and cough for 2 days with history of hypertension  EXAM: CHEST  2 VIEW  COMPARISON:  DG PNEUMONIA CHEST PORT1V dated 03/15/2011  FINDINGS: The lungs are adequately inflated. The interstitial markings are increased in the mid and lower lung zones bilaterally. This suggests interstitial pneumonia. The cardiopericardial silhouette is top-normal in size. The pulmonary vascularity is not clearly engorged. There is no pleural effusion. The observed portions of the bony thorax exhibit no acute abnormalities.  IMPRESSION: The findings are consistent with bilateral interstitial pneumonia.   Electronically Signed   By: David  Martinique   On: 12/12/2013 14:44      EKG Interpretation   Date/Time:  Thursday December 12 2013 13:35:39 EDT Ventricular Rate:  134 PR Interval:  168 QRS Duration: 90 QT Interval:  308 QTC Calculation: 460 R Axis:   0 Text Interpretation:  Sinus tachycardia Sinus arrhythmia Nonspecific ST  abnormality `st dep laterally changed from prior ecg Confirmed by Ashok Cordia   MD, Lennette Bihari ([id number]) on 12/12/2013 1:42:14 PM      MDM  Iv ns. Labs.   Cxr. Continuous pulse and and monitor.   Pt hypoxic on room air w pulse ox in mid 80's.  On 4 liters, sats 94%.   Reviewed nursing notes and prior charts for additional history.   cxr c/w pna, no recent hospitalization, lives independently, will rx for cap, rocephin and zithromax iv.   Med service contact for admission.    Mirna Mires, MD 12/12/13 [phone]

## 2013-12-12 NOTE — Progress Notes (Signed)
Pt arrived to 1418 via stretcher.  Oriented to staff and room.  Sister in law at bedside.  No complaints or concerns at this time.  Garry Heater, RN 12/12/2013

## 2013-12-12 NOTE — Progress Notes (Signed)
Pt placed on green/droplet precautions per protocol (influenza PCR ordered).  Pt and visitors educated.  Garry Heater, RN 12/12/2013

## 2013-12-12 NOTE — H&P (Addendum)
[facility] History and Physical  [name] ZOX:[id number] DOB: 28-Feb-1925 DOA: 12/12/2013  Referring physician:  PCP: [name], MD  Specialists:   Chief Complaint: SOB, cough, fever   HPI: [name] is a 80 y.o. male with PMH of HTN, HPL,esophageral stricture s/p dilatation (2013), h/o Asthma, tobacco use presented with SOB, DOE, associated with productive cough, fever, chills for several days, reports his wife is having similar symptoms; denies chest pain, no dizziness, had mild nausea, vomited x 1, no diarrhea -ED x ray showed BL pneumonia   Review of Systems: The patient denies anorexia, fever, weight loss,, vision loss, decreased hearing, hoarseness, chest pain, syncope, peripheral edema, balance deficits, hemoptysis, abdominal pain, melena, hematochezia, severe indigestion/heartburn, hematuria, incontinence, genital sores, muscle weakness, suspicious skin lesions, transient blindness, difficulty walking, depression, unusual weight change, abnormal bleeding, enlarged lymph nodes, angioedema, and breast masses.    Past Medical History  Diagnosis Date  . Hypertension   . Hyperlipidemia   . Tobacco abuse   . Lung nodule     Left lung, seen 2012, no change in 2012, no follow up needed   . PNA (pneumonia) 2010    Bilateral Pneumonia, COPD 26mm LLL Nodule   . History of ETT 09/1991     POS   . History of MRI 04-22-06    L/S- right hnp  l5/si   . B12 deficiency   . TIA (transient ischemic attack)   . GERD (gastroesophageal reflux disease)    Past Surgical History  Procedure Laterality Date  . Cystectomy  1995    Lumbar area    Social History:  reports that he quit smoking about 28 years ago. His smoking use included Cigarettes. He smoked 0.00 packs per day. He has quit using smokeless tobacco. His smokeless tobacco use included Chew. He reports that he does not drink alcohol or use illicit drugs. Home;l  where does patient live--home, ALF, SNF? and with whom  if at home? Yes;  Can patient participate in ADLs?  No Known Allergies  Family History  Problem Relation Age of Onset  . Hypertension Mother   . Heart attack Father   . Colon cancer Neg Hx   . Esophageal cancer Neg Hx     (be sure to complete)  Prior to Admission medications   Medication Sig Start Date End Date Taking? Authorizing Provider  amLODipine (NORVASC) 10 MG tablet Take 1 tablet (10 mg total) by mouth daily. 10/10/13  Yes Tonia Ghent, MD  aspirin EC 81 MG tablet Take 81 mg by mouth daily.   Yes Historical Provider, MD  hydrochlorothiazide (HYDRODIURIL) 25 MG tablet Take 1 tablet (25 mg total) by mouth daily. 10/10/13  Yes Tonia Ghent, MD  lisinopril (PRINIVIL,ZESTRIL) 20 MG tablet Take 1 tablet (20 mg total) by mouth daily. 06/24/13  Yes Tonia Ghent, MD  omeprazole (PRILOSEC) 20 MG capsule Take 20 mg by mouth daily.   Yes Historical Provider, MD  simvastatin (ZOCOR) 20 MG tablet Take 1/2 tab by mouth at night 02/13/13  Yes Tonia Ghent, MD  fluorouracil (EFUDEX) 5 % cream  08/04/11   Historical Provider, MD   Physical Exam: Filed Vitals:   12/12/13 1415  BP: 120/55  Pulse: 61  Temp:   Resp: 24     General:  alert  Eyes: EOM-I  ENT: no oral ulcers   Neck: supple   Cardiovascular: s1,s2 tachycardia   Respiratory: BL rales   Abdomen: soft,nt,nd   Skin:  some ecchymosis   Musculoskeletal: no LE edema  Psychiatric: no hallucinations   Neurologic: CN 2-12 intact, motor 5/5 BL  Labs on Admission:  Basic Metabolic Panel:  Recent Labs Lab 12/12/13 1344  NA 132*  K 3.3*  CL 88*  CO2 29  GLUCOSE 115*  BUN 26*  CREATININE 1.16  CALCIUM 9.0   Liver Function Tests:  Recent Labs Lab 12/12/13 1344  AST 36  ALT 32  ALKPHOS 110  BILITOT 0.7  PROT 7.1  ALBUMIN 2.5*   No results found for this basename: LIPASE, AMYLASE,  in the last 168 hours No results found for this basename: AMMONIA,  in the last 168 hours CBC:  Recent Labs Lab  12/12/13 1344  WBC 17.1*  HGB 11.4*  HCT 33.8*  MCV 80.7  PLT 349   Cardiac Enzymes:  Recent Labs Lab 12/12/13 1344  TROPONINI <0.30    BNP (last 3 results) No results found for this basename: PROBNP,  in the last 8760 hours CBG: No results found for this basename: GLUCAP,  in the last 168 hours  Radiological Exams on Admission: Dg Chest 2 View  12/12/2013   CLINICAL DATA:  Shortness of breath and cough for 2 days with history of hypertension  EXAM: CHEST  2 VIEW  COMPARISON:  DG PNEUMONIA CHEST PORT1V dated 03/15/2011  FINDINGS: The lungs are adequately inflated. The interstitial markings are increased in the mid and lower lung zones bilaterally. This suggests interstitial pneumonia. The cardiopericardial silhouette is top-normal in size. The pulmonary vascularity is not clearly engorged. There is no pleural effusion. The observed portions of the bony thorax exhibit no acute abnormalities.  IMPRESSION: The findings are consistent with bilateral interstitial pneumonia.   Electronically Signed   By: David  Martinique   On: 12/12/2013 14:44    EKG: Independently reviewed. Sinus tachy  Assessment/Plan Principal Problem:   CAP (community acquired pneumonia) Active Problems:   HYPERTENSION   PNEUMONIA, BILATERAL   Sepsis  80 y.o. male with PMH of HTN, HPL, esophageral stricture s/p dilatation (2013), h/o Asthma, tobacco use presented with SOB, DOE, associated with productive cough, fever, chills for several days is admitted with BL pneumonia, sepsis   1. BL pneumonia/sepsis/sirs; CXR:  BL infiltrates  -started empiric atx, pend cultures; cont oxygen, prn bronchodilators  2. Acute hypoxic resp failure due to severe pneumonia  -cont as above;  3. H/o esophogeal stricture s/p dilatation (2013) -SLP eval r/o aspiration; may need GI eval EGD/dilatation when stable   4. Hypo K, hypo Na likely sepsis+diuretic; replace lytes  5. HTN currently septic; hold BP meds; prn hydrazine   D/w  patient, his sister, friend at the bedside;  -patient is DNR  None;  if consultant consulted, please document name and whether formally or informally consulted  Code Status: DNR (must indicate code status--if unknown or must be presumed, indicate so) Family Communication: d/w patient, his sister, and friend  (indicate person spoken with, if applicable, with phone number if by telephone) Disposition Plan: pend clinical improvement  (indicate anticipated LOS)  Time spent: >35 minutes   Kinnie Feil [facility] Pager [phone]  If 7PM-7AM, please contact night-coverage [email or web address] Password [facility] 12/12/2013, 3:33 PM

## 2013-12-12 NOTE — ED Notes (Signed)
Made patient aware that we need a urine sample but he was unable to go at this time

## 2013-12-12 NOTE — ED Notes (Signed)
Hospitalist at bedside 

## 2013-12-12 NOTE — ED Notes (Signed)
Pt c/o fever and congestion for a couple of weeks. Pt states "I dont feel good".

## 2013-12-13 DIAGNOSIS — E538 Deficiency of other specified B group vitamins: Secondary | ICD-10-CM

## 2013-12-13 DIAGNOSIS — R269 Unspecified abnormalities of gait and mobility: Secondary | ICD-10-CM

## 2013-12-13 LAB — CBC
HCT: 32.7 % — ABNORMAL LOW (ref 39.0–52.0)
Hemoglobin: 10.8 g/dL — ABNORMAL LOW (ref 13.0–17.0)
MCH: 26.9 pg (ref 26.0–34.0)
MCHC: 33 g/dL (ref 30.0–36.0)
MCV: 81.5 fL (ref 78.0–100.0)
PLATELETS: 328 10*3/uL (ref 150–400)
RBC: 4.01 MIL/uL — AB (ref 4.22–5.81)
RDW: 13.9 % (ref 11.5–15.5)
WBC: 14.2 10*3/uL — AB (ref 4.0–10.5)

## 2013-12-13 LAB — BASIC METABOLIC PANEL
BUN: 22 mg/dL (ref 6–23)
CALCIUM: 8.3 mg/dL — AB (ref 8.4–10.5)
CO2: 30 meq/L (ref 19–32)
Chloride: 92 mEq/L — ABNORMAL LOW (ref 96–112)
Creatinine, Ser: 1.12 mg/dL (ref 0.50–1.35)
GFR, EST AFRICAN AMERICAN: 67 mL/min — AB (ref >90)
GFR, EST NON AFRICAN AMERICAN: 57 mL/min — AB (ref >90)
Glucose, Bld: 86 mg/dL (ref 70–99)
Potassium: 3 mEq/L — ABNORMAL LOW (ref 3.7–5.3)
SODIUM: 135 meq/L — AB (ref 137–147)

## 2013-12-13 LAB — INFLUENZA PANEL BY PCR (TYPE A & B)
H1N1 flu by pcr: NOT DETECTED
INFLBPCR: NEGATIVE
Influenza A By PCR: NEGATIVE

## 2013-12-13 LAB — LEGIONELLA ANTIGEN, URINE: Legionella Antigen, Urine: NEGATIVE

## 2013-12-13 LAB — EXPECTORATED SPUTUM ASSESSMENT W REFEX TO RESP CULTURE: SPECIAL REQUESTS: NORMAL

## 2013-12-13 MED ORDER — POTASSIUM CHLORIDE CRYS ER 20 MEQ PO TBCR
40.0000 meq | EXTENDED_RELEASE_TABLET | Freq: Once | ORAL | Status: AC
  Administered 2013-12-13: 40 meq via ORAL
  Filled 2013-12-13 (×2): qty 2

## 2013-12-13 MED ORDER — GUAIFENESIN 100 MG/5ML PO SOLN: 200.0000 mg | ORAL | Status: DC | PRN

## 2013-12-13 NOTE — Progress Notes (Signed)
Patient ID: [name], male   DOB: [birth year], 80 y.o.   MRN: [id number]  [facility] PROGRESS NOTE  [name] EGB:[id number] DOB: 1925-01-12 DOA: 12/12/2013 PCP: [name], MD  Brief narrative: 80 y.o. male with PMH of HTN, HPL, esophageral stricture, s/p dilatation (2013), h/o Asthma, tobacco use, presented with SOB, DOE, associated with productive cough, fever, chills for several days, associated with nausea and one episode of non bloody vomiting, poor oral intake, reports his wife is having similar symptoms; denies chest pain, dizziness, diarrhea. Admitted for management of PNA.   Principal Problem:   Acute hypoxic respiratory failure with hypoxia - secondary to PNA, ? CAP vs aspiration  - continue Vancomycin and Zosyn day #2 - sputum culutre pending - urine legionella and strep pneumo negative  - influenza panel negative Active Problems:   HYPERTENSION - stable, reasonable inpatient control    Sepsis (leukocytosis, tachycardia, hypotension, fever) - secondary to PNA - ABX as noted above - stop IVF as pt has more crackles on exam this AM - WBC is trending down, repeat CBC in AM   Hypokalemia - supplement and repeat BMP in AM   Acute on chronic blood loss anemia - slight drop in hg overnight - possibly dilutional - no signs of bleeding - CBC in AM  Consultants:  None  Procedures/Studies: Dg Chest 2 View  12/12/2013  The findings are consistent with bilateral interstitial pneumonia.    Antibiotics:  Vancomycin 4/16 -->  Zosyn 4/16 -->  Code Status: Full Family Communication: Pt at bedside Disposition Plan: Home when medically stable  HPI/Subjective: No events overnight.   Objective: Filed Vitals:   12/12/13 1829 12/12/13 2200 12/13/13 0523 12/13/13 1348  BP:  144/55 102/44 111/50  Pulse: 65 66 87 58  Temp: 98.9 F (37.2 C) 99.4 F (37.4 C) 97.9 F (36.6 C) 98.2 F (36.8 C)  TempSrc: Oral Oral Oral Oral  Resp: 18 18 18 20   Height:       Weight:      SpO2: 92% 92% 97% 96%    Intake/Output Summary (Last 24 hours) at 12/13/13 1731 Last data filed at 12/13/13 1721  Gross per 24 hour  Intake  917.5 ml  Output    300 ml  Net  617.5 ml    Exam:   General:  Pt is alert, follows commands appropriately, not in acute distress  Cardiovascular: Regular rate and rhythm, S1/S2, no murmurs, no rubs, no gallops  Respiratory: Bilateral rhonchi and crackles at bases   Abdomen: Soft, non tender, non distended, bowel sounds present, no guarding  Extremities: No edema, pulses DP and PT palpable bilaterally  Neuro: Grossly nonfocal  Data Reviewed: Basic Metabolic Panel:  Recent Labs Lab 12/12/13 1344 12/13/13 0355  NA 132* 135*  K 3.3* 3.0*  CL 88* 92*  CO2 29 30  GLUCOSE 115* 86  BUN 26* 22  CREATININE 1.16 1.12  CALCIUM 9.0 8.3*   Liver Function Tests:  Recent Labs Lab 12/12/13 1344  AST 36  ALT 32  ALKPHOS 110  BILITOT 0.7  PROT 7.1  ALBUMIN 2.5*   CBC:  Recent Labs Lab 12/12/13 1344 12/13/13 0355  WBC 17.1* 14.2*  HGB 11.4* 10.8*  HCT 33.8* 32.7*  MCV 80.7 81.5  PLT 349 328   Cardiac Enzymes:  Recent Labs Lab 12/12/13 1344  TROPONINI <0.30   Recent Results (from the past 240 hour(s))  CULTURE, BLOOD (ROUTINE X 2)     Status: None  Collection Time    12/12/13  5:21 PM      Result Value Ref Range Status   Specimen Description BLOOD RIGHT ANTECUBITAL   Final   Special Requests BOTTLES DRAWN AEROBIC AND ANAEROBIC [facility]   Final   Culture  Setup Time     Final   Value: 12/12/2013 20:03     Performed at [facility]   Culture     Final   Value:        BLOOD CULTURE RECEIVED NO GROWTH TO DATE CULTURE WILL BE HELD FOR 5 DAYS BEFORE ISSUING A FINAL NEGATIVE REPORT     Performed at [facility]   Report Status PENDING   Incomplete  CULTURE, BLOOD (ROUTINE X 2)     Status: None   Collection Time    12/12/13  5:28 PM      Result Value Ref Range Status   Specimen  Description BLOOD LEFT HAND   Final   Special Requests BOTTLES DRAWN AEROBIC AND ANAEROBIC 5CC   Final   Culture  Setup Time     Final   Value: 12/12/2013 20:04     Performed at [facility]   Culture     Final   Value:        BLOOD CULTURE RECEIVED NO GROWTH TO DATE CULTURE WILL BE HELD FOR 5 DAYS BEFORE ISSUING A FINAL NEGATIVE REPORT     Performed at [facility]   Report Status PENDING   Incomplete  CULTURE, EXPECTORATED SPUTUM-ASSESSMENT     Status: None   Collection Time    12/13/13  9:31 AM      Result Value Ref Range Status   Specimen Description SPUTUM   Final   Special Requests Normal   Final   Sputum evaluation     Final   Value: MICROSCOPIC FINDINGS SUGGEST THAT THIS SPECIMEN IS NOT REPRESENTATIVE OF LOWER RESPIRATORY SECRETIONS. PLEASE RECOLLECT.     Ledell Noss RN AT [phone] ON 04.17.15 BY SHUEA   Report Status 12/13/2013 FINAL   Final     Scheduled Meds: . aspirin EC  81 mg Oral Daily  . enoxaparin injection  40 mg Subcutaneous Q24H  . pantoprazole  40 mg Oral Daily  . ZOSYN  IV  3.375 g Intravenous Q8H  . simvastatin  20 mg Oral q1800  . vancomycin  500 mg Intravenous Q12H   Continuous Infusions: . sodium chloride Stopped (12/12/13 1640)  . 0.9 % NaCl with KCl 20 mEq / L 75 mL/hr at 12/12/13 1950   DTOIZ T IWPYK, MD  [facility] Pager [phone]  If 7PM-7AM, please contact night-coverage [email or web address] Password [email or web address] 12/13/2013, 5:31 PM   LOS: 1 day

## 2013-12-13 NOTE — Care Management Note (Addendum)
    Page 1 of 2   12/20/2013     11:34:51 AM CARE MANAGEMENT NOTE 12/20/2013  Patient:  [name], [name]   Account Number:  [id number]  Date Initiated:  12/13/2013  Documentation initiated by:  Dessa Phi  Subjective/Objective Assessment:   80 Y/O M ADMITTED W/COUGH,FEVER.DNR.     Action/Plan:   FROM HOME W/SPOUSE WHO IS ILL.HAS PCP,PHARMACY.   Anticipated DC Date:  12/23/2013   Anticipated DC Plan:  [place]  CM consult      Choice offered to / List presented to:  C-1 Patient   DME arranged  [facility]      DME agency  [place] arranged  [place].   Status of service:  Completed, signed off Medicare Important Message given?   (If response is "NO", the following Medicare IM given date fields will be blank) Date Medicare IM given:   Date Additional Medicare IM given:    Discharge Disposition:  [place]  Per UR Regulation:  Reviewed for med. necessity/level of care/duration of stay  If discussed at [place] of Stay Meetings, dates discussed:   12/17/2013  12/19/2013    Comments:  12/19/13 KATHY MAHABIR RN,BSN NCM [phone] 02 4L [place],IV LASIX,IV ABX.PNA,?ASPIRATION.PULMONARY/GI FOLLOWING.[facility] ALREADY FOLLOWING-AWAIT FINAL HHPT ORDER.NOTED ON 02-CAN ARRANGE HOME 02.  12/17/13 KATHY MAHABIR RN,BSN NCM [phone] [place]-02 3L.IF HOME 02 NEEDED CAN ARRANGE FOR HOME.[facility] ALREADY FOLLOWING-HHPT,AWAIT FINAL HHC ORDER.  12/16/13 KATHY MAHABIR RN,BSN NCM [phone] PT-HH.[facility] CHOSEN FOR HH.TC KRISTEN [facility] REP AWARE OF REFERRAL.AWAIT FINAL HHPT ORDER.  12/13/13 KATHY MAHABIR RN,BSN NCM [phone] ST-DYSPHAGIA,THIN LIQ DIET.NPO EXCEPT SIPS,IV ABX,DROPLET PREC.RECOMMEND PT/OT CONS WHEN APPROPRIATE.

## 2013-12-13 NOTE — Evaluation (Signed)
Clinical/Bedside Swallow Evaluation Patient Details  Name: [name] MRN: [id number] Date of Birth: 21-Mar-1925  Today's Date: 12/13/2013 Time: 8657-8469 SLP Time Calculation (min): 48 min  Past Medical History:  Past Medical History  Diagnosis Date  . Hypertension   . Hyperlipidemia   . Tobacco abuse   . Lung nodule     Left lung, seen 2012, no change in 2012, no follow up needed   . PNA (pneumonia) 2010    Bilateral Pneumonia, COPD 7mm LLL Nodule   . History of ETT 09/1991     POS   . History of MRI 04-22-06    L/S- right hnp  l5/si   . B12 deficiency   . TIA (transient ischemic attack)   . GERD (gastroesophageal reflux disease)    Past Surgical History:  Past Surgical History  Procedure Laterality Date  . Cystectomy  1995    Lumbar area    HPI:  [name] is a 80 y.o. male with PMH of HTN, HPL,esophageral stricture s/p dilatation (2013), h/o Asthma, tobacco use presented with SOB, DOE, associated with productive cough, fever, chills for several days, reports his wife is having similar symptoms; denies chest pain, no dizziness, had mild nausea, vomited x 1, no diarrhea   Assessment / Plan / Recommendation Clinical Impression  Suspected primary esophageal dysphagia secondary to patient/family report of patient frequently having to go to the bathroom during meals due to food/phlegm being regurgitated.  Patient appears to have normal oropharyngeal swallow function, with timely swallow initiation and good laryngeal elevation palpated.  There was one incidence of wet vocal quality after swallows and congested cough that was noted prior to any po's given.  Family reports pt. was scheduled to see Dr. Henrene Pastor, GI on May 19th, regarding possible need for repeat dilitation.  With this recent episode of vomiting and subsequent bilateral pneumonia (possible aspiration of emesis) it is recommended that pt. have GI consult while in hospital.  Will place on a chopped diet (Dys 2) with  thin liquids as tolerated.  If there is concern for silent aspiration please order MBS.    Aspiration Risk  Moderate    Diet Recommendation Dysphagia 2 (Fine chop);Thin liquid   Liquid Administration via: Cup;Straw Medication Administration: Whole meds with puree Supervision: Patient able to self feed;Intermittent supervision to cue for compensatory strategies Compensations: Slow rate;Small sips/bites;Follow solids with liquid Postural Changes and/or Swallow Maneuvers: Seated upright 90 degrees;Upright 30-60 min after meal    Other  Recommendations Recommended Consults: Consider GI evaluation;Consider esophageal assessment Oral Care Recommendations: Oral care BID Other Recommendations: Clarify dietary restrictions   Follow Up Recommendations  None    Frequency and Duration        Pertinent Vitals/Pain Afebrile currently; Bilateral pneumonia s/p suspected aspiration of emesis.        Swallow Study Prior Functional Status       General HPI: [name] is a 80 y.o. male with PMH of HTN, HPL,esophageral stricture s/p dilatation (2013), h/o Asthma, tobacco use presented with SOB, DOE, associated with productive cough, fever, chills for several days, reports his wife is having similar symptoms; denies chest pain, no dizziness, had mild nausea, vomited x 1, no diarrhea Type of Study: Bedside swallow evaluation Previous Swallow Assessment: 2010 (report not available) Diet Prior to this Study: NPO Temperature Spikes Noted: Yes Respiratory Status: Nasal cannula History of Recent Intubation: No Behavior/Cognition: Alert;Cooperative;Pleasant mood Oral Cavity - Dentition: Adequate natural dentition Self-Feeding Abilities: Able to feed self  Patient Positioning: Upright in bed Baseline Vocal Quality: Clear Volitional Cough: Congested Volitional Swallow: Able to elicit    Oral/Motor/Sensory Function Overall Oral Motor/Sensory Function: Appears within functional limits for tasks  assessed   Ice Chips Ice chips: Not tested   Thin Liquid Thin Liquid: Within functional limits Presentation: Cup;Straw    Nectar Thick Nectar Thick Liquid: Not tested   Honey Thick Honey Thick Liquid: Not tested   Puree Puree: Within functional limits Presentation: Self Fed;Spoon   Solid   GO    Solid: Within functional limits Presentation: Self Fed       Joaquim Nam 12/13/2013,10:39 AM

## 2013-12-14 ENCOUNTER — Inpatient Hospital Stay (HOSPITAL_COMMUNITY): Payer: Medicare Other

## 2013-12-14 LAB — BASIC METABOLIC PANEL
BUN: 17 mg/dL (ref 6–23)
CHLORIDE: 94 meq/L — AB (ref 96–112)
CO2: 32 meq/L (ref 19–32)
Calcium: 8.4 mg/dL (ref 8.4–10.5)
Creatinine, Ser: 1.02 mg/dL (ref 0.50–1.35)
GFR calc Af Amer: 75 mL/min — ABNORMAL LOW (ref >90)
GFR calc non Af Amer: 64 mL/min — ABNORMAL LOW (ref >90)
Glucose, Bld: 105 mg/dL — ABNORMAL HIGH (ref 70–99)
POTASSIUM: 3.5 meq/L — AB (ref 3.7–5.3)
SODIUM: 138 meq/L (ref 137–147)

## 2013-12-14 LAB — PRO B NATRIURETIC PEPTIDE: PRO B NATRI PEPTIDE: 2914 pg/mL — AB (ref 0–450)

## 2013-12-14 LAB — CBC
HCT: 33.5 % — ABNORMAL LOW (ref 39.0–52.0)
Hemoglobin: 11 g/dL — ABNORMAL LOW (ref 13.0–17.0)
MCH: 27 pg (ref 26.0–34.0)
MCHC: 32.8 g/dL (ref 30.0–36.0)
MCV: 82.1 fL (ref 78.0–100.0)
PLATELETS: 371 10*3/uL (ref 150–400)
RBC: 4.08 MIL/uL — AB (ref 4.22–5.81)
RDW: 14.1 % (ref 11.5–15.5)
WBC: 15.2 10*3/uL — ABNORMAL HIGH (ref 4.0–10.5)

## 2013-12-14 MED ORDER — FUROSEMIDE 10 MG/ML IJ SOLN
20.0000 mg | Freq: Once | INTRAMUSCULAR | Status: AC
  Administered 2013-12-14: 20 mg via INTRAVENOUS
  Filled 2013-12-14: qty 2

## 2013-12-14 MED ORDER — POTASSIUM CHLORIDE CRYS ER 20 MEQ PO TBCR
40.0000 meq | EXTENDED_RELEASE_TABLET | Freq: Once | ORAL | Status: AC
  Administered 2013-12-14: 40 meq via ORAL
  Filled 2013-12-14: qty 2

## 2013-12-14 MED ORDER — FUROSEMIDE 10 MG/ML IJ SOLN
20.0000 mg | Freq: Every day | INTRAMUSCULAR | Status: DC
  Filled 2013-12-14: qty 2

## 2013-12-14 NOTE — Progress Notes (Signed)
Patient ID: [name], male   DOB: 27-Jan-1925, 80 y.o.   MRN: [id number]  [facility] PROGRESS NOTE  [name]:[id number] DOB: [birth year] DOA: 12/12/2013 PCP: [name], MD  Brief narrative: 80 y.o. male with PMH of HTN, HPL, esophageral stricture, s/p dilatation (2013), h/o Asthma, tobacco use, presented with SOB, DOE, associated with productive cough, fever, chills for several days, associated with nausea and one episode of non bloody vomiting, poor oral intake, reports his wife is having similar symptoms; denies chest pain, dizziness, diarrhea. Admitted for management of PNA.   Principal Problem:  Acute hypoxic respiratory failure with hypoxia  - secondary to PNA, ? CAP vs aspiration, CHF diastolic vs systolic   - continue Vancomycin and Zosyn day #3 - sputum culutre pending  - urine legionella and strep pneumo negative  - influenza panel negative  Active Problems:  Pulmonary vascular congestion  - noted on CXR and pt with crackles on exam, BNP > 2000 - will order 2  D ECHO - place on lasix 20 mg IV QD for now, monitor daily weights, Is and O's HYPERTENSION  - stable, reasonable inpatient control  Sepsis (leukocytosis, tachycardia, hypotension, fever)  - secondary to PNA  - ABX as noted above  - WBC is trending down since admission but up from yesterday  - repeat CBC in AM Hypokalemia  - continue to supplement and repeat BMP in AM  Acute on chronic blood loss anemia  - slight drop in hg overnight  - possibly dilutional  - no signs of bleeding  - CBC in AM   Consultants:  None Procedures/Studies:  Dg Chest 2 View 12/12/2013 The findings are consistent with bilateral interstitial pneumonia.  Dg Chest Port 1 View  12/14/2013   1. Increased patchy airspace opacity in the medial right lower lobe concerning for pneumonia. Asymmetric pulmonary edema is a less likely consideration. 2. Slightly increased bilateral interstitial opacities suspected to represent  superimposed interstitial edema. Atypical infection/ inflammation is also possible.  Antibiotics:  Vancomycin 4/16 -->  Zosyn 4/16 -->  Code Status: DNR Family Communication: Pt, son at bedside  Disposition Plan: Home when medically stable  HPI/Subjective: No events overnight.   Objective: Filed Vitals:   12/13/13 0523 12/13/13 1348 12/13/13 2000 12/14/13 0600  BP: 102/44 111/50 128/73 119/63  Pulse: 87 58 63 79  Temp: 97.9 F (36.6 C) 98.2 F (36.8 C) 98.7 F (37.1 C) 98.2 F (36.8 C)  TempSrc: Oral Oral Oral Oral  Resp: 18 20 24 22   Height:      Weight:      SpO2: 97% 96% 94% 98%    Intake/Output Summary (Last 24 hours) at 12/14/13 0855 Last data filed at 12/14/13 0809  Gross per 24 hour  Intake 1396.25 ml  Output    300 ml  Net 1096.25 ml    Exam:   General:  Pt is alert, follows commands appropriately, not in acute distress  Cardiovascular: Regular rate and rhythm, no rubs, no gallops  Respiratory: Bilateral rhonchi and crackles at bases   Abdomen: Soft, non tender, non distended, bowel sounds present, no guarding  Extremities: No edema, pulses DP and PT palpable bilaterally  Neuro: Grossly nonfocal  Data Reviewed: Basic Metabolic Panel:  Recent Labs Lab 12/12/13 1344 12/13/13 0355 12/14/13 0412  NA 132* 135* 138  K 3.3* 3.0* 3.5*  CL 88* 92* 94*  CO2 29 30 32  GLUCOSE 115* 86 105*  BUN 26* 22 17  CREATININE 1.16  1.12 1.02  CALCIUM 9.0 8.3* 8.4   Liver Function Tests:  Recent Labs Lab 12/12/13 1344  AST 36  ALT 32  ALKPHOS 110  BILITOT 0.7  PROT 7.1  ALBUMIN 2.5*   CBC:  Recent Labs Lab 12/12/13 1344 12/13/13 0355 12/14/13 0412  WBC 17.1* 14.2* 15.2*  HGB 11.4* 10.8* 11.0*  HCT 33.8* 32.7* 33.5*  MCV 80.7 81.5 82.1  PLT 349 328 371   Cardiac Enzymes:  Recent Labs Lab 12/12/13 1344  TROPONINI <0.30   Recent Results (from the past 240 hour(s))  CULTURE, BLOOD (ROUTINE X 2)     Status: None   Collection Time     12/12/13  5:21 PM      Result Value Ref Range Status   Specimen Description BLOOD RIGHT ANTECUBITAL   Final   Special Requests BOTTLES DRAWN AEROBIC AND ANAEROBIC [facility]   Final   Culture  Setup Time     Final   Value: 12/12/2013 20:03     Performed at [facility]   Culture     Final   Value:        BLOOD CULTURE RECEIVED NO GROWTH TO DATE CULTURE WILL BE HELD FOR 5 DAYS BEFORE ISSUING A FINAL NEGATIVE REPORT     Performed at [facility]   Report Status PENDING   Incomplete  CULTURE, BLOOD (ROUTINE X 2)     Status: None   Collection Time    12/12/13  5:28 PM      Result Value Ref Range Status   Specimen Description BLOOD LEFT HAND   Final   Special Requests BOTTLES DRAWN AEROBIC AND ANAEROBIC 5CC   Final   Culture  Setup Time     Final   Value: 12/12/2013 20:04     Performed at [facility]   Culture     Final   Value:        BLOOD CULTURE RECEIVED NO GROWTH TO DATE CULTURE WILL BE HELD FOR 5 DAYS BEFORE ISSUING A FINAL NEGATIVE REPORT     Performed at [facility]   Report Status PENDING   Incomplete  CULTURE, EXPECTORATED SPUTUM-ASSESSMENT     Status: None   Collection Time    12/13/13  9:31 AM      Result Value Ref Range Status   Specimen Description SPUTUM   Final   Special Requests Normal   Final   Sputum evaluation     Final   Value: MICROSCOPIC FINDINGS SUGGEST THAT THIS SPECIMEN IS NOT REPRESENTATIVE OF LOWER RESPIRATORY SECRETIONS. PLEASE RECOLLECT.     Ledell Noss RN AT [phone] ON 04.17.15 BY SHUEA   Report Status 12/13/2013 FINAL   Final     Scheduled Meds: . aspirin EC  81 mg Oral Daily  . enoxaparin (LOVENOX) injection  40 mg Subcutaneous Q24H  . furosemide  20 mg Intravenous Once  . pantoprazole  40 mg Oral Daily  . piperacillin-tazobactam (ZOSYN)  IV  3.375 g Intravenous Q8H  . potassium chloride  40 mEq Oral Once  . simvastatin  20 mg Oral q1800  . sodium chloride  3 mL Intravenous Q12H  . vancomycin  500 mg Intravenous  Q12H   Continuous Infusions: . sodium chloride Stopped (12/12/13 1640)     Theodis Blaze, MD  [facility] Pager [phone]  If 7PM-7AM, please contact night-coverage [email or web address] Password [email or web address] 12/14/2013, 8:55 AM   LOS: 2 days

## 2013-12-15 DIAGNOSIS — I517 Cardiomegaly: Secondary | ICD-10-CM

## 2013-12-15 LAB — BASIC METABOLIC PANEL
BUN: 17 mg/dL (ref 6–23)
CHLORIDE: 89 meq/L — AB (ref 96–112)
CO2: 35 mEq/L — ABNORMAL HIGH (ref 19–32)
CREATININE: 1.11 mg/dL (ref 0.50–1.35)
Calcium: 8.8 mg/dL (ref 8.4–10.5)
GFR, EST AFRICAN AMERICAN: 67 mL/min — AB (ref >90)
GFR, EST NON AFRICAN AMERICAN: 58 mL/min — AB (ref >90)
Glucose, Bld: 90 mg/dL (ref 70–99)
POTASSIUM: 3.5 meq/L — AB (ref 3.7–5.3)
Sodium: 136 mEq/L — ABNORMAL LOW (ref 137–147)

## 2013-12-15 LAB — VANCOMYCIN, TROUGH: VANCOMYCIN TR: 11.2 ug/mL (ref 10.0–20.0)

## 2013-12-15 MED ORDER — POTASSIUM CHLORIDE CRYS ER 20 MEQ PO TBCR
20.0000 meq | EXTENDED_RELEASE_TABLET | Freq: Once | ORAL | Status: AC
  Administered 2013-12-15: 20 meq via ORAL
  Filled 2013-12-15: qty 1

## 2013-12-15 MED ORDER — VANCOMYCIN HCL IN DEXTROSE 750-5 MG/150ML-% IV SOLN
750.0000 mg | Freq: Two times a day (BID) | INTRAVENOUS | Status: DC
  Administered 2013-12-15 – 2013-12-18 (×6): 750 mg via INTRAVENOUS
  Filled 2013-12-15 (×7): qty 150

## 2013-12-15 MED ORDER — FUROSEMIDE 10 MG/ML IJ SOLN
20.0000 mg | Freq: Every day | INTRAMUSCULAR | Status: DC
  Administered 2013-12-16 – 2013-12-17 (×2): 20 mg via INTRAVENOUS
  Filled 2013-12-15 (×2): qty 2

## 2013-12-15 NOTE — Evaluation (Signed)
Physical Therapy Evaluation Patient Details Name: [name] MRN: [id number] DOB: [birth year] Today's Date: 12/15/2013   History of Present Illness  80 yo presents with coughinh, SOB, DOE, and xrAY + for Bil pneumonia.   Clinical Impression  Pt presents with B pna, weakness and SOB upon exertion. To benefit from continued PT to increase strength, and able pt to return home at ModI level.     Follow Up Recommendations Home health PT;Supervision/Assistance - 24 hour (initially)    Equipment Recommendations  Rolling walker with 5" wheels    Recommendations for Other Services       Precautions / Restrictions        Mobility  Bed Mobility Overal bed mobility: Needs Assistance Bed Mobility: Supine to Sit;Sit to Supine     Supine to sit: Min assist     General bed mobility comments: for upper body and to scoot to edge of bed.   Transfers Overall transfer level: Needs assistance Equipment used: Rolling walker (2 wheeled) Transfers: Sit to/from Stand Sit to Stand: Min assist            Ambulation/Gait Ambulation/Gait assistance: Min assist;+2 safety/equipment Ambulation Distance (Feet): 50 Feet Assistive device: Rolling walker (2 wheeled) Gait Pattern/deviations: Trunk flexed     General Gait Details: pt kept wanting to hold walker at lower points with increased trunk flexion especially towards end of walk due to fatigue in trunk and SOB, DOE  Stairs            Wheelchair Mobility    Modified Rankin (Stroke Patients Only)       Balance                                             Pertinent Vitals/Pain No c/o pain , 0 2 sats at rest with Jerm[place] 3 1/2 L at 87-88%, and with ambulation still at 88%. After breathing exercises, pt did increase his sats to 93%. Educated pt to perform this 5x every hour while awake.     Home Living Family/patient expects to be discharged to:: Private residence Living Arrangements: Spouse/significant other  (who is not feeling very well at this moment as well. ) Available Help at Discharge: Family Type of Home: House Home Access: Stairs to enter Entrance Stairs-Rails: Right Entrance Stairs-Number of Steps: 3-4 Home Layout: One level Home Equipment: [place] - single point (but hge is not sure where it is)      Prior Function Level of Independence: Independent         Comments: stated he drives,and takes care of everything, but has a yard person to take care of yard.      Hand Dominance        Extremity/Trunk Assessment               Lower Extremity Assessment: Generalized weakness (BLES seems very strong, mostly weak with activity tolerance and ability)         Communication   Communication: No difficulties (little gargled speech today )  Cognition Arousal/Alertness: Awake/alert Behavior During Therapy: WFL for tasks assessed/performed Overall Cognitive Status: Within Functional Limits for tasks assessed                      General Comments      Exercises Other Exercises Other Exercises: educated and perfromed pursed lip breathing for oxygenation. with  5 breathing exercises O2 sats increased from 88% to 93% . Educated pt to perfrom every hour while he is awake.       Assessment/Plan    PT Assessment Patient needs continued PT services  PT Diagnosis Generalized weakness   PT Problem List Decreased strength;Decreased activity tolerance;Decreased mobility;Decreased knowledge of use of DME  PT Treatment Interventions DME instruction;Gait training;Stair training;Functional mobility training;Therapeutic activities;Therapeutic exercise;Patient/family education   PT Goals (Current goals can be found in the Care Plan section) Acute Rehab PT Goals Patient Stated Goal: to get to feeling better PT Goal Formulation: With patient Time For Goal Achievement: 12/30/13 Potential to Achieve Goals: Good    Frequency Min 3X/week   Barriers to discharge         Co-evaluation               End of Session Equipment Utilized During Treatment: Gait belt Activity Tolerance: Patient tolerated treatment well Patient left: in chair;with call bell/phone within reach;with family/visitor present (sister in law present) Nurse Communication: Mobility status         Time: 7673-4193 PT Time Calculation (min): 25 min   Charges:   PT Evaluation $Initial PT Evaluation Tier I: 1 Procedure PT Treatments $Gait Training: 8-22 mins $Therapeutic Activity: 8-22 mins   PT G CodesClide Dales 01-09-2014, 1:36 PM Clide Dales, PT Pager: [phone] Jan 09, 2014

## 2013-12-15 NOTE — Progress Notes (Signed)
  Echocardiogram 2D Echocardiogram has been performed.  [name] 12/15/2013, 10:54 AM

## 2013-12-15 NOTE — Progress Notes (Signed)
Patient ID: [name], male   DOB: February 17, 1925, 80 y.o.   MRN: [id number]  [facility] PROGRESS NOTE  [name] NWG:[id number] DOB: [birth year] DOA: 12/12/2013 PCP: [name], MD  Brief narrative:  80 y.o. male with PMH of HTN, HPL, esophageral stricture, s/p dilatation (2013), h/o Asthma, tobacco use, presented with SOB, DOE, associated with productive cough, fever, chills for several days, associated with nausea and one episode of non bloody vomiting, poor oral intake, reports his wife is having similar symptoms; denies chest pain, dizziness, diarrhea. Admitted for management of PNA.   Principal Problem:  Acute hypoxic respiratory failure with hypoxia  - secondary to PNA, ? CAP vs aspiration, CHF diastolic vs systolic  - continue Vancomycin and Zosyn day #4 - sputum culutre pending but so far negative to date  - urine legionella and strep pneumo negative  - influenza panel negative  Active Problems:  Pulmonary vascular congestion  - noted on CXR and pt with crackles on exam, BNP > 2000  - 2 D ECHO pending  - place on lasix 20 mg IV QD for now, monitor daily weights, Is and O's  - weight 137 lbs this AM and will monitor trend  HYPERTENSION  - stable, reasonable inpatient control  Sepsis (leukocytosis, tachycardia, hypotension, fever)  - secondary to PNA  - ABX as noted above  - WBC is trending down since admission but up from yesterday  - repeat CBC in AM  Hypokalemia  - from Lasix diuresis  - continue to supplement and repeat BMP in AM  Acute on chronic blood loss anemia  - slight drop in hg since admission - possibly dilutional  - no signs of bleeding  - CBC in AM   Consultants:  None Procedures/Studies:  Dg Chest 2 View 12/12/2013 The findings are consistent with bilateral interstitial pneumonia.  Dg Chest Port 1 View 12/14/2013 1. Increased patchy airspace opacity in the medial right lower lobe concerning for pneumonia. Asymmetric pulmonary edema is a  less likely consideration. 2. Slightly increased bilateral interstitial opacities suspected to represent superimposed interstitial edema. Atypical infection/ inflammation is also possible.  Antibiotics:  Vancomycin 4/16 -->  Zosyn 4/16 -->  Code Status: DNR  Family Communication: Pt, son at bedside  Disposition Plan: Home when medically stable  HPI/Subjective: No events overnight.   Objective: Filed Vitals:   12/14/13 1345 12/14/13 1700 12/14/13 2131 12/15/13 0527  BP: 127/71  121/60 134/57  Pulse: 91  52 92  Temp: 98.1 F (36.7 C)  98.3 F (36.8 C) 98.1 F (36.7 C)  TempSrc: Oral  Oral Oral  Resp: 24  22 22   Height:      Weight:  62.324 kg (137 lb 6.4 oz)  62.551 kg (137 lb 14.4 oz)  SpO2: 92%  96% 93%    Intake/Output Summary (Last 24 hours) at 12/15/13 5784 Last data filed at 12/15/13 0700  Gross per 24 hour  Intake    700 ml  Output   2050 ml  Net  -1350 ml    Exam:   General:  Pt is alert, follows commands appropriately, not in acute distress  Cardiovascular: Regular rate and rhythm, S1/S2, no murmurs, no rubs, no gallops  Respiratory: Bilateral rhonchi with crackles at bases   Abdomen: Soft, non tender, non distended, bowel sounds present, no guarding  Extremities: No edema, pulses DP and PT palpable bilaterally  Neuro: Grossly nonfocal  Data Reviewed: Basic Metabolic Panel:  Recent Labs Lab 12/12/13 1344 12/13/13 0355  12/14/13 0412 12/15/13 0543  NA 132* 135* 138 136*  K 3.3* 3.0* 3.5* 3.5*  CL 88* 92* 94* 89*  CO2 29 30 32 35*  GLUCOSE 115* 86 105* 90  BUN 26* 22 17 17   CREATININE 1.16 1.12 1.02 1.11  CALCIUM 9.0 8.3* 8.4 8.8   Liver Function Tests:  Recent Labs Lab 12/12/13 1344  AST 36  ALT 32  ALKPHOS 110  BILITOT 0.7  PROT 7.1  ALBUMIN 2.5*   CBC:  Recent Labs Lab 12/12/13 1344 12/13/13 0355 12/14/13 0412  WBC 17.1* 14.2* 15.2*  HGB 11.4* 10.8* 11.0*  HCT 33.8* 32.7* 33.5*  MCV 80.7 81.5 82.1  PLT 349 328 371    Cardiac Enzymes:  Recent Labs Lab 12/12/13 1344  TROPONINI <0.30   Recent Results (from the past 240 hour(s))  CULTURE, BLOOD (ROUTINE X 2)     Status: None   Collection Time    12/12/13  5:21 PM      Result Value Ref Range Status   Specimen Description BLOOD RIGHT ANTECUBITAL   Final   Special Requests BOTTLES DRAWN AEROBIC AND ANAEROBIC [facility]   Final   Culture  Setup Time     Final   Value: 12/12/2013 20:03     Performed at [facility]   Culture     Final   Value:        BLOOD CULTURE RECEIVED NO GROWTH TO DATE CULTURE WILL BE HELD FOR 5 DAYS BEFORE ISSUING A FINAL NEGATIVE REPORT     Performed at [facility]   Report Status PENDING   Incomplete  CULTURE, BLOOD (ROUTINE X 2)     Status: None   Collection Time    12/12/13  5:28 PM      Result Value Ref Range Status   Specimen Description BLOOD LEFT HAND   Final   Special Requests BOTTLES DRAWN AEROBIC AND ANAEROBIC 5CC   Final   Culture  Setup Time     Final   Value: 12/12/2013 20:04     Performed at [facility]   Culture     Final   Value:        BLOOD CULTURE RECEIVED NO GROWTH TO DATE CULTURE WILL BE HELD FOR 5 DAYS BEFORE ISSUING A FINAL NEGATIVE REPORT     Performed at [facility]   Report Status PENDING   Incomplete  CULTURE, EXPECTORATED SPUTUM-ASSESSMENT     Status: None   Collection Time    12/13/13  9:31 AM      Result Value Ref Range Status   Specimen Description SPUTUM   Final   Special Requests Normal   Final   Sputum evaluation     Final   Value: MICROSCOPIC FINDINGS SUGGEST THAT THIS SPECIMEN IS NOT REPRESENTATIVE OF LOWER RESPIRATORY SECRETIONS. PLEASE RECOLLECT.     Ledell Noss RN AT 2536 ON 04.17.15 BY SHUEA   Report Status 12/13/2013 FINAL   Final     Scheduled Meds: . aspirin EC  81 mg Oral Daily  . enoxaparin (LOVENOX) injection  40 mg Subcutaneous Q24H  . furosemide  20 mg Intravenous Daily  . pantoprazole  40 mg Oral Daily  .  piperacillin-tazobactam (ZOSYN)  IV  3.375 g Intravenous Q8H  . simvastatin  20 mg Oral q1800  . sodium chloride  3 mL Intravenous Q12H  . vancomycin  750 mg Intravenous Q12H   Continuous Infusions: . sodium chloride Stopped (12/12/13 1640)  Theodis Blaze, MD  [facility] Pager [phone]  If 7PM-7AM, please contact night-coverage [email or web address] Password [email or web address] 12/15/2013, 7:38 AM   LOS: 3 days

## 2013-12-15 NOTE — Progress Notes (Signed)
ANTIBIOTIC CONSULT NOTE - FOLLOW UP  Pharmacy Consult for vancomycin Indication: rule out sepsis  No Known Allergies  Patient Measurements: Height: 5\' 6"  (167.6 cm) Weight: 137 lb 14.4 oz (62.551 kg) IBW/kg (Calculated) : 63.8 Adjusted Body Weight:   Vital Signs: Temp: 98.1 F (36.7 C) (04/19 0527) Temp src: Oral (04/19 0527) BP: 134/57 mmHg (04/19 0527) Pulse Rate: 92 (04/19 0527) Intake/Output from previous day: 04/18 0701 - 04/19 0700 In: 570 [P.O.:480; I.V.:40; IV Piggyback:50] Out: 2050 [Urine:2050] Intake/Output from this shift: Total I/O In: 90 [I.V.:40; IV Piggyback:50] Out: 675 [Urine:675]  Labs:  Recent Labs  12/12/13 1344 12/13/13 0355 12/14/13 0412 12/15/13 0543  WBC 17.1* 14.2* 15.2*  --   HGB 11.4* 10.8* 11.0*  --   PLT 349 328 371  --   CREATININE 1.16 1.12 1.02 1.11   Estimated Creatinine Clearance: 42.3 ml/min (by C-G formula based on Cr of 1.11).  Recent Labs  12/15/13 0543  [place] 11.2     Microbiology: Recent Results (from the past 720 hour(s))  CULTURE, BLOOD (ROUTINE X 2)     Status: None   Collection Time    12/12/13  5:21 PM      Result Value Ref Range Status   Specimen Description BLOOD RIGHT ANTECUBITAL   Final   Special Requests BOTTLES DRAWN AEROBIC AND ANAEROBIC [facility]   Final   Culture  Setup Time     Final   Value: 12/12/2013 20:03     Performed at [facility]   Culture     Final   Value:        BLOOD CULTURE RECEIVED NO GROWTH TO DATE CULTURE WILL BE HELD FOR 5 DAYS BEFORE ISSUING A FINAL NEGATIVE REPORT     Performed at [facility]   Report Status PENDING   Incomplete  CULTURE, BLOOD (ROUTINE X 2)     Status: None   Collection Time    12/12/13  5:28 PM      Result Value Ref Range Status   Specimen Description BLOOD LEFT HAND   Final   Special Requests BOTTLES DRAWN AEROBIC AND ANAEROBIC 5CC   Final   Culture  Setup Time     Final   Value: 12/12/2013 20:04     Performed at [facility]    Culture     Final   Value:        BLOOD CULTURE RECEIVED NO GROWTH TO DATE CULTURE WILL BE HELD FOR 5 DAYS BEFORE ISSUING A FINAL NEGATIVE REPORT     Performed at [facility]   Report Status PENDING   Incomplete  CULTURE, EXPECTORATED SPUTUM-ASSESSMENT     Status: None   Collection Time    12/13/13  9:31 AM      Result Value Ref Range Status   Specimen Description SPUTUM   Final   Special Requests Normal   Final   Sputum evaluation     Final   Value: MICROSCOPIC FINDINGS SUGGEST THAT THIS SPECIMEN IS NOT REPRESENTATIVE OF LOWER RESPIRATORY SECRETIONS. PLEASE RECOLLECT.     Ledell Noss RN AT 0945 ON 04.17.15 BY SHUEA   Report Status 12/13/2013 FINAL   Final    Anti-infectives   Start     Dose/Rate Route Frequency Ordered Stop   12/15/13 1800  vancomycin (VANCOCIN) IVPB 750 mg/150 ml premix     750 mg 150 mL/hr over 60 Minutes Intravenous Every 12 hours 12/15/13 0636     12/13/13 0600  vancomycin (VANCOCIN) 500 mg in sodium chloride 0.9 % 100 mL IVPB  Status:  Discontinued     500 mg 100 mL/hr over 60 Minutes Intravenous Every 12 hours 12/12/13 1651 12/15/13 0636   12/12/13 1800  piperacillin-tazobactam (ZOSYN) IVPB 3.375 g     3.375 g 12.5 mL/hr over 240 Minutes Intravenous Every 8 hours 12/12/13 1651     12/12/13 1730  vancomycin (VANCOCIN) IVPB 750 mg/150 ml premix     750 mg 150 mL/hr over 60 Minutes Intravenous  Once 12/12/13 1651 12/12/13 1940   12/12/13 1515  cefTRIAXone (ROCEPHIN) 1 g in dextrose 5 % 50 mL IVPB  Status:  Discontinued     1 g 100 mL/hr over 30 Minutes Intravenous Every 24 hours 12/12/13 1501 12/12/13 1638   12/12/13 1515  azithromycin (ZITHROMAX) 500 mg in dextrose 5 % 250 mL IVPB  Status:  Discontinued     500 mg 250 mL/hr over 60 Minutes Intravenous Every 24 hours 12/12/13 1501 12/12/13 1638      Assessment: Patient with low vancomycin level.    Goal of Therapy:  Vancomycin trough level 15-20 mcg/ml  Plan:  Measure antibiotic  drug levels at steady state Follow up culture results Increase vancomycin to 750mg  iv q12hr  Shea Stakes Crowford Tyler Deis. 12/15/2013,6:39 AM

## 2013-12-16 DIAGNOSIS — K209 Esophagitis, unspecified without bleeding: Secondary | ICD-10-CM

## 2013-12-16 DIAGNOSIS — K222 Esophageal obstruction: Secondary | ICD-10-CM

## 2013-12-16 LAB — CBC
HCT: 33.3 % — ABNORMAL LOW (ref 39.0–52.0)
HEMOGLOBIN: 10.8 g/dL — AB (ref 13.0–17.0)
MCH: 26.8 pg (ref 26.0–34.0)
MCHC: 32.4 g/dL (ref 30.0–36.0)
MCV: 82.6 fL (ref 78.0–100.0)
Platelets: 418 10*3/uL — ABNORMAL HIGH (ref 150–400)
RBC: 4.03 MIL/uL — ABNORMAL LOW (ref 4.22–5.81)
RDW: 14.2 % (ref 11.5–15.5)
WBC: 12.4 10*3/uL — ABNORMAL HIGH (ref 4.0–10.5)

## 2013-12-16 LAB — BASIC METABOLIC PANEL
BUN: 16 mg/dL (ref 6–23)
CHLORIDE: 91 meq/L — AB (ref 96–112)
CO2: 38 mEq/L — ABNORMAL HIGH (ref 19–32)
Calcium: 8.6 mg/dL (ref 8.4–10.5)
Creatinine, Ser: 1.03 mg/dL (ref 0.50–1.35)
GFR, EST AFRICAN AMERICAN: 74 mL/min — AB (ref >90)
GFR, EST NON AFRICAN AMERICAN: 64 mL/min — AB (ref >90)
Glucose, Bld: 98 mg/dL (ref 70–99)
POTASSIUM: 3.5 meq/L — AB (ref 3.7–5.3)
Sodium: 136 mEq/L — ABNORMAL LOW (ref 137–147)

## 2013-12-16 LAB — PRO B NATRIURETIC PEPTIDE: Pro B Natriuretic peptide (BNP): 1821 pg/mL — ABNORMAL HIGH (ref 0–450)

## 2013-12-16 MED ORDER — ALBUTEROL SULFATE (2.5 MG/3ML) 0.083% IN NEBU
2.5000 mg | INHALATION_SOLUTION | Freq: Four times a day (QID) | RESPIRATORY_TRACT | Status: DC
  Administered 2013-12-16 – 2013-12-19 (×8): 2.5 mg via RESPIRATORY_TRACT
  Filled 2013-12-16 (×10): qty 3

## 2013-12-16 MED ORDER — POTASSIUM CHLORIDE CRYS ER 20 MEQ PO TBCR
40.0000 meq | EXTENDED_RELEASE_TABLET | Freq: Once | ORAL | Status: AC
  Administered 2013-12-16: 40 meq via ORAL
  Filled 2013-12-16: qty 2

## 2013-12-16 MED ORDER — SODIUM CHLORIDE 0.9 % IV SOLN
INTRAVENOUS | Status: DC
  Administered 2013-12-16: 23:00:00 via INTRAVENOUS

## 2013-12-16 NOTE — Consult Note (Signed)
Referring Provider: No ref. provider found Primary Care Physician:  [name], MD Primary Gastroenterologist:  Dr. Henrene Pastor  Reason for Consultation:  Dysphagia   HPI: [name] is a 80 y.o. male known to Dr. [name] who was recently seen in our office by one of our PA's with complaints of difficulty swallowing and is currently scheduled for an EGD with possible dilation on 5/11.  He did undergo upper endoscopy in February 2013 was found to have esophagitis and a ringlike stricture in the distal esophagus measuring 14 mm. He was dilated to [place].  He admits that the previous procedure did help the swallowing quite a bit, but over the past 3-4 months (at least) he has had recurrent symptoms. He says he's had some progression over the past month. He is not having trouble with every meal or even every day but says his symptoms have become more frequent. He has had a couple episodes of dysphagia requiring regurgitation and says frequently has to stop eating to allow food time to pass. Says sometimes with these episodes he gets somewhat nauseated. He denies any abdominal pain and no odynophagia.  Since that visit, the patient presented to [facility] ED on 4/16 with complaints of SOB, DOE, associated with productive cough, fever and chills for several days, as well as with nausea and one episode of non-bloody vomiting and  poor oral intake.  Apparently his wife has been experiencing similar symptoms.  He was admitted for management of PNA.  He is on Vancomycin and Zosyn day #5 currently.  He met criteria for sepsis on admission with leukocytosis, fever, hypotension, and tachycardia.  They are questioning if this PNA is due to aspiration related to his dysphagia.  He says that he feels much better from a respiratory standpoint and was walking around the halls earlier today.  *His sister-in-law was at bedside at the time of my visit with the patient.   Past Medical History  Diagnosis Date  .  Hypertension   . Hyperlipidemia   . Tobacco abuse   . Lung nodule     Left lung, seen 2012, no change in 2012, no follow up needed   . PNA (pneumonia) 2010    Bilateral Pneumonia, COPD 90m LLL Nodule   . History of ETT 09/1991     POS   . History of MRI 04-22-06    L/S- right hnp  l5/si   . B12 deficiency   . TIA (transient ischemic attack)   . GERD (gastroesophageal reflux disease)     Past Surgical History  Procedure Laterality Date  . Cystectomy  1995    Lumbar area     Prior to Admission medications   Medication Sig Start Date End Date Taking? Authorizing Provider  amLODipine (NORVASC) 10 MG tablet Take 1 tablet (10 mg total) by mouth daily. 10/10/13  Yes GTonia Ghent MD  aspirin EC 81 MG tablet Take 81 mg by mouth daily.   Yes Historical Provider, MD  hydrochlorothiazide (HYDRODIURIL) 25 MG tablet Take 1 tablet (25 mg total) by mouth daily. 10/10/13  Yes GTonia Ghent MD  lisinopril (PRINIVIL,ZESTRIL) 20 MG tablet Take 1 tablet (20 mg total) by mouth daily. 06/24/13  Yes GTonia Ghent MD  omeprazole (PRILOSEC) 20 MG capsule Take 20 mg by mouth daily.   Yes Historical Provider, MD  simvastatin (ZOCOR) 20 MG tablet Take 1/2 tab by mouth at night 02/13/13  Yes GTonia Ghent MD  fluorouracil ([facility]  5 % cream  08/04/11   Historical Provider, MD    Current Facility-Administered Medications  Medication Dose Route Frequency Provider Last Rate Last Dose  . 0.9 %  sodium chloride infusion   Intravenous Continuous Mirna Mires, MD      . acetaminophen (TYLENOL) tablet 650 mg  650 mg Oral Q6H PRN Kinnie Feil, MD       Or  . acetaminophen (TYLENOL) suppository 650 mg  650 mg Rectal Q6H PRN Kinnie Feil, MD      . albuterol (PROVENTIL) (2.5 MG/3ML) 0.083% nebulizer solution 2.5 mg  2.5 mg Nebulization Q2H PRN Kinnie Feil, MD      . aspirin EC tablet 81 mg  81 mg Oral Daily Kinnie Feil, MD   81 mg at 12/16/13 1106  . enoxaparin (LOVENOX) injection 40 mg   40 mg Subcutaneous Q24H Kinnie Feil, MD   40 mg at 12/15/13 1738  . furosemide (LASIX) injection 20 mg  20 mg Intravenous Daily Theodis Blaze, MD   20 mg at 12/16/13 1107  . guaiFENesin (ROBITUSSIN) 100 MG/5ML solution 200 mg  200 mg Oral Q4H PRN Theodis Blaze, MD      . ondansetron [facility]) tablet 4 mg  4 mg Oral Q6H PRN Kinnie Feil, MD       Or  . ondansetron (ZOFRAN) injection 4 mg  4 mg Intravenous Q6H PRN Kinnie Feil, MD      . pantoprazole (PROTONIX) EC tablet 40 mg  40 mg Oral Daily Kinnie Feil, MD   40 mg at 12/16/13 1106  . piperacillin-tazobactam (ZOSYN) IVPB 3.375 g  3.375 g Intravenous Q8H Emiliano Dyer, RPH   3.375 g at 12/16/13 1109  . simvastatin (ZOCOR) tablet 20 mg  20 mg Oral q1800 Kinnie Feil, MD   20 mg at 12/15/13 1738  . sodium chloride 0.9 % injection 3 mL  3 mL Intravenous Q12H Kinnie Feil, MD   3 mL at 12/15/13 1000  . vancomycin (VANCOCIN) IVPB 750 mg/150 ml premix  750 mg Intravenous Q12H Theodis Blaze, MD   750 mg at 12/16/13 3267    Allergies as of 12/12/2013  . (No Known Allergies)    Family History  Problem Relation Age of Onset  . Hypertension Mother   . Heart attack Father   . Colon cancer Neg Hx   . Esophageal cancer Neg Hx     History   Social History  . Marital Status: Married    Spouse Name: N/A    Number of Children: 2  . Years of Education: N/A   Occupational History  . 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 Smoking status: Former Smoker    Types: Cigarettes    Quit date: 08/29/1985  . Smokeless tobacco: Former Systems developer    Types: Chew  . Alcohol Use: No  . Drug Use: No  . Sexual Activity: Not on file   Other Topics Concern  . 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  Physical Exam: Vital signs in last 24 hours: Temp:  [98.1 F (  36.7 C)-98.8 F (37.1 C)] 98.8 F (37.1 C) (04/20 0432) Pulse Rate:  [55-98] 95 (04/20 0432) Resp:  [20-22] 22 (04/20 0432) BP: (111-134)/(54-65) 129/54 mmHg (04/20 0432) SpO2:  [94 %-97 %] 94 % (04/20 0432) Weight:  [139 lb 1.6 oz (63.095 kg)] 139 lb 1.6 oz (63.095 kg) (04/20 0432) Last BM Date: 12/15/13 General:   Alert, elderly and frail, pleasant and cooperative in NAD Head:  Normocephalic and atraumatic. Eyes:  Sclera clear, no icterus.   Conjunctiva pink. Ears:  Normal auditory acuity. Mouth:  No deformity or lesions.   Lungs:  Coarse BS B/L Heart:  Regular rate and rhythm; no murmurs, clicks, rubs,  or gallops. Abdomen:  Soft, non-distended.  BS present.  Non-tender.   Rectal:  Deferred  Msk:  Symmetrical without gross deformities. Pulses:  Normal pulses noted. Extremities:  Without clubbing or edema. Neurologic:  Alert and  oriented x4;  grossly normal neurologically. Skin:  Intact without significant lesions or rashes. Psych:  Alert and cooperative. Normal mood and affect.  Intake/Output from previous day: 04/19 0701 - 04/20 0700 In: 1170 [P.O.:720; IV Piggyback:450] Out: 576 [Urine:575; Stool:1] Intake/Output this shift: Total I/O In: 240 [P.O.:240] Out: -   Lab Results:  Recent Labs  12/14/13 0412 12/16/13 0426  WBC 15.2* 12.4*  HGB 11.0* 10.8*  HCT 33.5* 33.3*  PLT 371 418*   BMET  Recent Labs  12/14/13 0412 12/15/13 0543 12/16/13 0426  NA 138 136* 136*  K 3.5* 3.5* 3.5*  CL 94* 89* 91*  CO2 32 35* 38*  GLUCOSE 105* 90 98  BUN 17 17 16   CREATININE 1.02 1.11 1.03  CALCIUM 8.4 8.8 8.6   IMPRESSION:  -Dysphagia:  History of distal esophageal stricture requiring dilation in 09/2011 now with recurrent solid food dysphagia x 3-4 months, which has been gradually progressive. -PNA:  ? Aspiration.  ? Related to his  dysphagia. -HTN -COPD -History of TIA   PLAN: -Continue daily PPI; receiving pantoprazole 40 mg by mouth daily. -Continue dysphagia diet. -Continue to treat PNA with appropriate antibiotics and proceed with EGD with probable [place] dilation with Dr. Henrene Pastor on 5/11 as outpatient vs as inpatient prior to discharge.   Laban Emperor. Zehr  12/16/2013, 12:01 PM  Pager number [phone]  GI ATTENDING  History, laboratories, x-rays, prior endoscopy report review. Patient seen and examined. Agree with H&P as above. I do not think that his dysphagia and history of esophageal stricture are directly responsible for his pneumonia. He does however need upper endoscopy with esophageal dilation. Plans were for endoscopy in a few weeks. However, he is currently stable and this can be performed as inpatient (possibly tomorrow), for his convenience, prior to discharge. We will arrange. Thanks  Docia Chuck. Geri Seminole., M.D. [facility] Division of Gastroenterology

## 2013-12-16 NOTE — Progress Notes (Addendum)
1735 - Pt had taken off 3L nasal cannula, requesting to leave off.  O2 sat checked, 84%.  Replaced O2 at 3L, sats 86%. Monitored for 5 min, coaching pt's breathing.  Unable to get O2 sats WNL even at 6L [place], placed on venti mask at 3L/24%.  Notified Dr. Doyle Askew via text page.  O2 sats on mask fluctuating between 88-91%.  Other VSS, pt in no apparent distress.    1745-  No change in O2 sats, still dipping to 88-89% every few seconds.  Paged respiratory therapy to give PRN breathing treatment and assess pt.  They agreed to come, will monitor pt until they arrive.  Garry Heater, RN 12/16/2013  1830- Pt's O2 sats up to 95% after breathing treatment.  Will continue to monitor.  Garry Heater, RN 12/16/2013

## 2013-12-16 NOTE — Progress Notes (Signed)
Physical Therapy Treatment Patient Details Name: [name] MRN: [id number] DOB: [birth year] Today's Date: 12/17/13    History of Present Illness      PT Comments    Pt  Feeling better today; gait distance limited only due to urinary incontinence/condom cath came off; NT replaced and pt assisted by PT  with LB hygiene;  Follow Up Recommendations  Home health PT;Supervision/Assistance - 24 hour     Equipment Recommendations  Rolling walker with 5" wheels    Recommendations for Other Services       Precautions / Restrictions Precautions Precaution Comments: monitor O2    Mobility  Bed Mobility Overal bed mobility: Needs Assistance Bed Mobility: Supine to Sit     Supine to sit: Supervision     General bed mobility comments: incr time  Transfers Overall transfer level: Needs assistance Equipment used: Rolling walker (2 wheeled) Transfers: Sit to/from Stand Sit to Stand: Min guard         General transfer comment: cues for placement and control of descent  Ambulation/Gait Ambulation/Gait assistance: Min guard;Supervision Ambulation Distance (Feet): 60 Feet Assistive device: Rolling walker (2 wheeled) Gait Pattern/deviations: Step-through pattern     General Gait Details: cues for RW safety   Stairs            Wheelchair Mobility    Modified Rankin (Stroke Patients Only)       Balance                                    Cognition Arousal/Alertness: Awake/alert Behavior During Therapy: WFL for tasks assessed/performed Overall Cognitive Status: Within Functional Limits for tasks assessed                      Exercises General Exercises - Lower Extremity Ankle Circles/Pumps: AROM;Both;10 reps Quad Sets: AROM;Both;10 reps Other Exercises Other Exercises: reinforced pursed lip breathing    General Comments        Pertinent Vitals/Pain O2 sats 92% on 3L O during gait    Home Living                       Prior Function            PT Goals (current goals can now be found in the care plan section) Acute Rehab PT Goals Patient Stated Goal: to get to feeling better Time For Goal Achievement: 12/30/13 Potential to Achieve Goals: Good Progress towards PT goals: Progressing toward goals    Frequency  Min 3X/week    PT Plan Current plan remains appropriate    Co-evaluation             End of Session Equipment Utilized During Treatment: Gait belt Activity Tolerance: Patient tolerated treatment well Patient left: in chair;with call bell/phone within reach;with family/visitor present     Time: 6063-0160 PT Time Calculation (min): 35 min  Charges:  $Gait Training: 23-37 mins                    G Codes:      Neil Crouch December 17, 2013, 11:17 AM

## 2013-12-16 NOTE — Progress Notes (Signed)
Patient ID: [name], male   DOB: May 23, 1925, 80 y.o.   MRN: [id number]  [facility] PROGRESS NOTE  CROY DRUMWRIGHT WUX:[id number] DOB: June 14, 1925 DOA: 12/12/2013 PCP: [name], MD  Brief narrative:  80 y.o. male with PMH of HTN, HPL, esophageral stricture, s/p dilatation (2013), h/o Asthma, tobacco use, presented with SOB, DOE, associated with productive cough, fever, chills for several days, associated with nausea and one episode of non bloody vomiting, poor oral intake, reports his wife is having similar symptoms; denies chest pain, dizziness, diarrhea. Admitted for management of PNA.   Principal Problem:  Acute hypoxic respiratory failure with hypoxia  - secondary to PNA, ? CAP vs aspiration, CHF diastolic vs systolic  - continue Vancomycin and Zosyn day #5 - sputum culutre pending but so far negative to date  - urine legionella and strep pneumo negative  - influenza panel negative  Active Problems:  Pulmonary vascular congestion  - noted on CXR and pt with crackles on exam, BNP > 2000  - 2 D ECHO with normal systolic function and grade I diastolic dysfunction  - continue lasix 20 mg IV QD for now, monitor daily weights, Is and O's  - weight 137 lbs --> 139 lbs this AM HYPERTENSION  - stable, reasonable inpatient control  Sepsis (leukocytosis, tachycardia, hypotension, fever)  - secondary to PNA  - ABX as noted above  - WBC is trending down since admission  - repeat CBC in AM  Hypokalemia  - from Lasix diuresis  - continue to supplement and repeat BMP in AM  Acute on chronic blood loss anemia  - slight drop in hg since admission  - possibly dilutional  - no signs of bleeding  - CBC in AM  Dysphagia  - GI consult for esophageal dilatation - appreciate input   Consultants:  None Procedures/Studies:  Dg Chest 2 View 12/12/2013 The findings are consistent with bilateral interstitial pneumonia.  Dg Chest Port 1 View 12/14/2013 1. Increased patchy airspace  opacity in the medial right lower lobe concerning for pneumonia. Asymmetric pulmonary edema is a less likely consideration. 2. Slightly increased bilateral interstitial opacities suspected to represent superimposed interstitial edema. Atypical infection/ inflammation is also possible.  Antibiotics:  Vancomycin 4/16 -->  Zosyn 4/16 -->  Code Status: DNR  Family Communication: Pt, son at bedside  Disposition Plan: Home when medically stable   HPI/Subjective: No events overnight.   Objective: Filed Vitals:   12/15/13 1312 12/15/13 1952 12/15/13 2007 12/16/13 0432  BP: 111/62 134/65  129/54  Pulse: 55 98  95  Temp: 98.2 F (36.8 C) 98.1 F (36.7 C)  98.8 F (37.1 C)  TempSrc: Oral Oral  Oral  Resp: 20 20  22   Height:      Weight:    63.095 kg (139 lb 1.6 oz)  SpO2: 96% 97% 95% 94%    Intake/Output Summary (Last 24 hours) at 12/16/13 1345 Last data filed at 12/16/13 1200  Gross per 24 hour  Intake   1120 ml  Output    576 ml  Net    544 ml    Exam:   General:  Pt is alert, follows commands appropriately, not in acute distress  Cardiovascular: Regular rate and rhythm, S1/S2, no murmurs, no rubs, no gallops  Respiratory: Clear to auscultation bilaterally, no wheezing, no crackles, no rhonchi  Abdomen: Soft, non tender, non distended, bowel sounds present, no guarding  Extremities: No edema, pulses DP and PT palpable bilaterally  Neuro: Grossly  nonfocal  Data Reviewed: Basic Metabolic Panel:  Recent Labs Lab 12/12/13 1344 12/13/13 0355 12/14/13 0412 12/15/13 0543 12/16/13 0426  NA 132* 135* 138 136* 136*  K 3.3* 3.0* 3.5* 3.5* 3.5*  CL 88* 92* 94* 89* 91*  CO2 29 30 32 35* 38*  GLUCOSE 115* 86 105* 90 98  BUN 26* 22 17 17 16   CREATININE 1.16 1.12 1.02 1.11 1.03  CALCIUM 9.0 8.3* 8.4 8.8 8.6   Liver Function Tests:  Recent Labs Lab 12/12/13 1344  AST 36  ALT 32  ALKPHOS 110  BILITOT 0.7  PROT 7.1  ALBUMIN 2.5*   No results found for this  basename: LIPASE, AMYLASE,  in the last 168 hours No results found for this basename: AMMONIA,  in the last 168 hours CBC:  Recent Labs Lab 12/12/13 1344 12/13/13 0355 12/14/13 0412 12/16/13 0426  WBC 17.1* 14.2* 15.2* 12.4*  HGB 11.4* 10.8* 11.0* 10.8*  HCT 33.8* 32.7* 33.5* 33.3*  MCV 80.7 81.5 82.1 82.6  PLT 349 328 371 418*   Cardiac Enzymes:  Recent Labs Lab 12/12/13 1344  TROPONINI <0.30   BNP: No components found with this basename: POCBNP,  CBG: No results found for this basename: GLUCAP,  in the last 168 hours  Recent Results (from the past 240 hour(s))  CULTURE, BLOOD (ROUTINE X 2)     Status: None   Collection Time    12/12/13  5:21 PM      Result Value Ref Range Status   Specimen Description BLOOD RIGHT ANTECUBITAL   Final   Special Requests BOTTLES DRAWN AEROBIC AND ANAEROBIC [facility]   Final   Culture  Setup Time     Final   Value: 12/12/2013 20:03     Performed at [facility]   Culture     Final   Value:        BLOOD CULTURE RECEIVED NO GROWTH TO DATE CULTURE WILL BE HELD FOR 5 DAYS BEFORE ISSUING A FINAL NEGATIVE REPORT     Performed at [facility]   Report Status PENDING   Incomplete  CULTURE, BLOOD (ROUTINE X 2)     Status: None   Collection Time    12/12/13  5:28 PM      Result Value Ref Range Status   Specimen Description BLOOD LEFT HAND   Final   Special Requests BOTTLES DRAWN AEROBIC AND ANAEROBIC 5CC   Final   Culture  Setup Time     Final   Value: 12/12/2013 20:04     Performed at [facility]   Culture     Final   Value:        BLOOD CULTURE RECEIVED NO GROWTH TO DATE CULTURE WILL BE HELD FOR 5 DAYS BEFORE ISSUING A FINAL NEGATIVE REPORT     Performed at [facility]   Report Status PENDING   Incomplete  CULTURE, EXPECTORATED SPUTUM-ASSESSMENT     Status: None   Collection Time    12/13/13  9:31 AM      Result Value Ref Range Status   Specimen Description SPUTUM   Final   Special Requests Normal   Final    Sputum evaluation     Final   Value: MICROSCOPIC FINDINGS SUGGEST THAT THIS SPECIMEN IS NOT REPRESENTATIVE OF LOWER RESPIRATORY SECRETIONS. PLEASE RECOLLECT.     Ledell Noss RN AT [id number] ON 04.17.15 BY SHUEA   Report Status 12/13/2013 FINAL   Final     Scheduled  Meds: . aspirin EC  81 mg Oral Daily  . enoxaparin (LOVENOX) injection  40 mg Subcutaneous Q24H  . furosemide  20 mg Intravenous Daily  . pantoprazole  40 mg Oral Daily  . piperacillin-tazobactam (ZOSYN)  IV  3.375 g Intravenous Q8H  . simvastatin  20 mg Oral q1800  . sodium chloride  3 mL Intravenous Q12H  . vancomycin  750 mg Intravenous Q12H   Continuous Infusions: . sodium chloride Stopped (12/12/13 1640)     Theodis Blaze, MD  [facility] Pager [phone]  If 7PM-7AM, please contact night-coverage [email or web address] Password [email or web address] 12/16/2013, 1:45 PM   LOS: 4 days

## 2013-12-16 NOTE — Progress Notes (Signed)
ANTIBIOTIC CONSULT NOTE  Pharmacy Consult for Vancomycin and Zosyn Indication: rule out sepsis  No Known Allergies  Patient Measurements: Height: 5\' 6"  (167.6 cm) Weight: 139 lb 1.6 oz (63.095 kg) IBW/kg (Calculated) : 63.8 As of 12/11/13: 65.2kg, 66in  Vital Signs: Temp: 98.8 F (37.1 C) (04/20 0432) Temp src: Oral (04/20 0432) BP: 129/54 mmHg (04/20 0432) Pulse Rate: 95 (04/20 0432) Intake/Output from previous day: 04/19 0701 - 04/20 0700 In: 1170 [P.O.:720; IV Piggyback:450] Out: 576 [Urine:575; Stool:1] Intake/Output from this shift: Total I/O In: 240 [P.O.:240] Out: -   Labs:  Recent Labs  12/14/13 0412 12/15/13 0543 12/16/13 0426  WBC 15.2*  --  12.4*  HGB 11.0*  --  10.8*  PLT 371  --  418*  CREATININE 1.02 1.11 1.03   Estimated Creatinine Clearance: 45.9 ml/min (by C-G formula based on Cr of 1.03). CrCl 45 ml/min/1.19m2 (normalized)  Recent Labs  12/15/13 0543  VANCOTROUGH 11.2     Microbiology: Recent Results (from the past 720 hour(s))  CULTURE, BLOOD (ROUTINE X 2)     Status: None   Collection Time    12/12/13  5:21 PM      Result Value Ref Range Status   Specimen Description BLOOD RIGHT ANTECUBITAL   Final   Special Requests BOTTLES DRAWN AEROBIC AND ANAEROBIC [facility]   Final   Culture  Setup Time     Final   Value: 12/12/2013 20:03     Performed at [facility]   Culture     Final   Value:        BLOOD CULTURE RECEIVED NO GROWTH TO DATE CULTURE WILL BE HELD FOR 5 DAYS BEFORE ISSUING A FINAL NEGATIVE REPORT     Performed at [facility]   Report Status PENDING   Incomplete  CULTURE, BLOOD (ROUTINE X 2)     Status: None   Collection Time    12/12/13  5:28 PM      Result Value Ref Range Status   Specimen Description BLOOD LEFT HAND   Final   Special Requests BOTTLES DRAWN AEROBIC AND ANAEROBIC 5CC   Final   Culture  Setup Time     Final   Value: 12/12/2013 20:04     Performed at [facility]   Culture     Final    Value:        BLOOD CULTURE RECEIVED NO GROWTH TO DATE CULTURE WILL BE HELD FOR 5 DAYS BEFORE ISSUING A FINAL NEGATIVE REPORT     Performed at [facility]   Report Status PENDING   Incomplete  CULTURE, EXPECTORATED SPUTUM-ASSESSMENT     Status: None   Collection Time    12/13/13  9:31 AM      Result Value Ref Range Status   Specimen Description SPUTUM   Final   Special Requests Normal   Final   Sputum evaluation     Final   Value: MICROSCOPIC FINDINGS SUGGEST THAT THIS SPECIMEN IS NOT REPRESENTATIVE OF LOWER RESPIRATORY SECRETIONS. PLEASE RECOLLECT.     Ledell Noss RN AT [id number] ON 04.17.15 BY SHUEA   Report Status 12/13/2013 FINAL   Final    4/16 blood x2: pending  Medical History: Past Medical History  Diagnosis Date  . Hypertension   . Hyperlipidemia   . Tobacco abuse   . Lung nodule     Left lung, seen 2012, no change in 2012, no follow up needed   . PNA (pneumonia) 2010  Bilateral Pneumonia, COPD 20mm LLL Nodule   . History of ETT 09/1991     POS   . History of MRI 04-22-06    L/S- right hnp  l5/si   . B12 deficiency   . TIA (transient ischemic attack)   . GERD (gastroesophageal reflux disease)     Medications:  Scheduled:  . aspirin EC  81 mg Oral Daily  . enoxaparin (LOVENOX) injection  40 mg Subcutaneous Q24H  . furosemide  20 mg Intravenous Daily  . pantoprazole  40 mg Oral Daily  . piperacillin-tazobactam (ZOSYN)  IV  3.375 g Intravenous Q8H  . simvastatin  20 mg Oral q1800  . sodium chloride  3 mL Intravenous Q12H  . vancomycin  750 mg Intravenous Q12H   Infusions:  . sodium chloride Stopped (12/12/13 1640)   Assessment: 80 yo male presents 4/16 with SOB, cough, fever and chills for several days, reporting that wife had similar symptoms. CXR shows bilateral PNA; initially given 1 dose of Rocephin for presumed CAP, but MD now changing to Vancomycin and Zosyn for presumed sepsis with possible aspiration.  4/16 >> Vanc >> 4/16 >> Zosyn  >>  Tmax: AF now WBCs: elevated but improving, today 12.4K Renal: SCr 1.03, stable, CrCl 46CG, 51N  4/16 blood: NGTD 4/16 UA: many bacteria w/ casts 4/16 urine strep: neg 4/16 urine legionella: neg 416 influenza PCR: neg 4/17 Sputum: not representative of lower resp secretions, need recollect  Drug level / dose changes info: 4/19 VT at 0500 = 11.2 before 6th total dose, increase 750mg  q12h 4/20 VT at 0500 = _____  Goal of Therapy:  Vancomycin trough level 15-20 mcg/ml Zosyn dose per renal function  Plan:   Continue vanc and zosyn at current doses Check trough at steady state tomorrow AM Follow up renal function & cultures, de-escalation of therapy  Ralene Bathe, PharmD, BCPS 12/16/2013, 11:14 AM  Pager: [phone]

## 2013-12-17 ENCOUNTER — Encounter (HOSPITAL_COMMUNITY): Payer: Medicare Other | Admitting: Anesthesiology

## 2013-12-17 ENCOUNTER — Encounter (HOSPITAL_COMMUNITY)
Admission: EM | Disposition: A | Discharge status: Home Health | Payer: Self-pay | Source: Home / Self Care | Attending: Internal Medicine

## 2013-12-17 ENCOUNTER — Inpatient Hospital Stay (HOSPITAL_COMMUNITY): Payer: Medicare Other

## 2013-12-17 ENCOUNTER — Inpatient Hospital Stay (HOSPITAL_COMMUNITY): Payer: Medicare Other | Admitting: Anesthesiology

## 2013-12-17 ENCOUNTER — Encounter (HOSPITAL_COMMUNITY): Payer: Self-pay | Admitting: *Deleted

## 2013-12-17 DIAGNOSIS — R0609 Other forms of dyspnea: Secondary | ICD-10-CM

## 2013-12-17 DIAGNOSIS — R0989 Other specified symptoms and signs involving the circulatory and respiratory systems: Secondary | ICD-10-CM

## 2013-12-17 DIAGNOSIS — R1310 Dysphagia, unspecified: Secondary | ICD-10-CM

## 2013-12-17 LAB — CBC
HCT: 31.8 % — ABNORMAL LOW (ref 39.0–52.0)
Hemoglobin: 10.4 g/dL — ABNORMAL LOW (ref 13.0–17.0)
MCH: 26.7 pg (ref 26.0–34.0)
MCHC: 32.7 g/dL (ref 30.0–36.0)
MCV: 81.7 fL (ref 78.0–100.0)
PLATELETS: 417 10*3/uL — AB (ref 150–400)
RBC: 3.89 MIL/uL — ABNORMAL LOW (ref 4.22–5.81)
RDW: 14.1 % (ref 11.5–15.5)
WBC: 10.7 10*3/uL — AB (ref 4.0–10.5)

## 2013-12-17 LAB — BASIC METABOLIC PANEL
BUN: 15 mg/dL (ref 6–23)
CALCIUM: 8.5 mg/dL (ref 8.4–10.5)
CO2: 37 mEq/L — ABNORMAL HIGH (ref 19–32)
Chloride: 89 mEq/L — ABNORMAL LOW (ref 96–112)
Creatinine, Ser: 1.14 mg/dL (ref 0.50–1.35)
GFR calc Af Amer: 65 mL/min — ABNORMAL LOW (ref >90)
GFR calc non Af Amer: 56 mL/min — ABNORMAL LOW (ref >90)
Glucose, Bld: 105 mg/dL — ABNORMAL HIGH (ref 70–99)
Potassium: 3.5 mEq/L — ABNORMAL LOW (ref 3.7–5.3)
Sodium: 136 mEq/L — ABNORMAL LOW (ref 137–147)

## 2013-12-17 LAB — VANCOMYCIN, TROUGH: VANCOMYCIN TR: 18.8 ug/mL (ref 10.0–20.0)

## 2013-12-17 LAB — PROCALCITONIN: PROCALCITONIN: 0.15 ng/mL

## 2013-12-17 LAB — PRO B NATRIURETIC PEPTIDE: Pro B Natriuretic peptide (BNP): 1657 pg/mL — ABNORMAL HIGH (ref 0–450)

## 2013-12-17 SURGERY — ESOPHAGOGASTRODUODENOSCOPY (EGD) WITH PROPOFOL
Anesthesia: Monitor Anesthesia Care

## 2013-12-17 MED ORDER — POTASSIUM CHLORIDE CRYS ER 20 MEQ PO TBCR
40.0000 meq | EXTENDED_RELEASE_TABLET | Freq: Every day | ORAL | Status: DC
  Administered 2013-12-17 – 2013-12-19 (×3): 40 meq via ORAL
  Filled 2013-12-17 (×4): qty 2

## 2013-12-17 MED ORDER — PROPOFOL 10 MG/ML IV BOLUS
INTRAVENOUS | Status: AC
  Filled 2013-12-17: qty 20

## 2013-12-17 MED ORDER — FUROSEMIDE 10 MG/ML IJ SOLN
40.0000 mg | Freq: Every day | INTRAMUSCULAR | Status: DC
  Administered 2013-12-18: 40 mg via INTRAVENOUS
  Filled 2013-12-17 (×2): qty 4

## 2013-12-17 SURGICAL SUPPLY — 15 items

## 2013-12-17 NOTE — Progress Notes (Signed)
SLP Cancellation Note  Patient Details Name: [name] MRN: [id number] DOB: [birth year]   Cancelled treatment:       Reason Eval/Treat Not Completed:  (pt with pulmonary nurse practicioner currently, note EGD cancelled today due to respiratory issues, slp to follow up x1)   Claudie Fisherman, [place] [facility] SLP [phone]

## 2013-12-17 NOTE — Consult Note (Signed)
Name: [name] MRN: [id number] DOB: 20-Dec-1924    ADMISSION DATE:  12/12/2013 CONSULTATION DATE:  4/21  REFERRING MD :  Doyle Askew PRIMARY SERVICE:  [facility]  CHIEF COMPLAINT:  Pneumonia/hypoxia   BRIEF PATIENT DESCRIPTION:  80 year old male w/ known h/o esophageal strictures, requiring prior dilation. Has had worsening progression of dysphagia over the last 3-4 mo. To point food not passing. Admitted on 4/16 w/ working dx of PNA                (aspiration vs CAP) as had sick family member. Initially improved some w/ empiric abx. Was planned for endoscopy to eval stricture on 4/21. Procedure cancelled due to hypoxia w/ O2 sat in 80s on 6 liters. PCCM asked to see in setting of hypoxia and PNA  SIGNIFICANT EVENTS / STUDIES:  Echo 4/19: EF 65-70%. gd I d-dysfxn   LINES / TUBES:   CULTURES: 4/16: U strep and legionella: neg 4/16 infuenza PCR: neg   ANTIBIOTICS:  zosyn 4/16 >>> canc 4/16>>>  HISTORY OF PRESENT ILLNESS:   80 y.o. male with PMH of HTN, HPL,esophageral stricture s/p dilatation (2013), h/o Asthma, tobacco use presented 4/16 with SOB, DOE, associated with productive cough, fever, chills for several days, reports his wife is having similar symptoms; denies chest pain, no dizziness, had mild nausea, vomited x 1. CXR showed patchy R>L pulmonary infiltrates. Was admitted w/ working dx of pneumonia. He was started on empiric abx, Urine antigens have been neg, influenza PCR was neg. He was seen in consult by GI given concern about aspiration as a potential etiology of his PNA. Seemed to be improving w/ ABX initially. Given h/o esophageal stricture and more recently worsening dysphagia. He was planned for Endoscopy to evaluate stricture on 4/21. While in holding room he was seen by anesthesia and the procedure was cancelled as the pt was hypoxic w/ O2 sats on 80s on 6 liters. PCCM has been asked to see on 4/21 for PNA and hypoxic resp failure    PAST MEDICAL HISTORY :  Past Medical  History  Diagnosis Date  . Hypertension   . Hyperlipidemia   . Tobacco abuse   . Lung nodule     Left lung, seen 2012, no change in 2012, no follow up needed   . PNA (pneumonia) 2010    Bilateral Pneumonia, COPD 52mm LLL Nodule   . History of ETT 09/1991     POS   . History of MRI 04-22-06    L/S- right hnp  l5/si   . B12 deficiency   . TIA (transient ischemic attack)   . GERD (gastroesophageal reflux disease)    Past Surgical History  Procedure Laterality Date  . Cystectomy  1995    Lumbar area    Prior to Admission medications   Medication Sig Start Date End Date Taking? Authorizing Provider  amLODipine (NORVASC) 10 MG tablet Take 1 tablet (10 mg total) by mouth daily. 10/10/13  Yes Tonia Ghent, MD  aspirin EC 81 MG tablet Take 81 mg by mouth daily.   Yes Historical Provider, MD  hydrochlorothiazide (HYDRODIURIL) 25 MG tablet Take 1 tablet (25 mg total) by mouth daily. 10/10/13  Yes Tonia Ghent, MD  lisinopril (PRINIVIL,ZESTRIL) 20 MG tablet Take 1 tablet (20 mg total) by mouth daily. 06/24/13  Yes Tonia Ghent, MD  omeprazole (PRILOSEC) 20 MG capsule Take 20 mg by mouth daily.   Yes Historical Provider, MD  simvastatin (ZOCOR) 20 MG  tablet Take 1/2 tab by mouth at night 02/13/13  Yes Tonia Ghent, MD  fluorouracil (EFUDEX) 5 % cream  08/04/11   Historical Provider, MD   No Known Allergies  FAMILY HISTORY:  Family History  Problem Relation Age of Onset  . Hypertension Mother   . Heart attack Father   . Colon cancer Neg Hx   . Esophageal cancer Neg Hx    SOCIAL HISTORY:  reports that he quit smoking about 28 years ago. His smoking use included Cigarettes. He smoked 0.00 packs per day. He has quit using smokeless tobacco. His smokeless tobacco use included Chew. He reports that he does not drink alcohol or use illicit drugs.  Review of Systems:   Bolds are positive  Constitutional: weight loss, gain, night sweats, Fevers, chills, fatigue . + sick exposure    HEENT: headaches, Sore throat, sneezing, nasal congestion, post nasal drip, Difficulty swallowing, Tooth/dental problems, visual complaints visual changes, ear ache CV:  chest pain, radiates: ,Orthopnea, PND, swelling in lower extremities, dizziness, palpitations, syncope.  GI  heartburn, indigestion, abdominal pain, nausea, vomiting, diarrhea, change in bowel habits, marked dysphagia  loss of appetite, bloody stools.  Resp: cough, productive: at times w/ yellow sputum, often times associated w/ difficulty swallowing , hemoptysis, dyspnea, chest pain, pleuritic.  Skin: rash or itching or icterus GU: dysuria, change in color of urine, urgency or frequency. flank pain, hematuria  MS: joint pain or swelling. decreased range of motion  Psych: change in mood or affect. depression or anxiety.  Neuro: difficulty with speech, weakness, numbness, ataxia    SUBJECTIVE:  No acute distress  VITAL SIGNS: Temp:  [98.3 F (36.8 C)-98.8 F (37.1 C)] 98.3 F (36.8 C) (04/21 0815) Pulse Rate:  [51-115] 93 (04/21 0526) Resp:  [20-24] 22 (04/21 0815) BP: (122-141)/(56-83) 140/83 mmHg (04/21 0815) SpO2:  [84 %-97 %] 88 % (04/21 0815) FiO2 (%):  [24 %] 24 % (04/20 1750) Weight:  [63.64 kg (140 lb 4.8 oz)] 63.64 kg (140 lb 4.8 oz) (04/21 0526) 3 liters  PHYSICAL EXAMINATION: General:  No acute distress  Neuro:  Awake, alert, hard of hearing  HEENT:  [place], no JVD  Cardiovascular:  rrr Lungs:  Scattered rhonchi no accessory muscle use  Abdomen:  Soft, non-tender + bowel sounds  Musculoskeletal:  Intact w/out edema    Recent Labs Lab 12/15/13 0543 12/16/13 0426 12/17/13 0422  NA 136* 136* 136*  K 3.5* 3.5* 3.5*  CL 89* 91* 89*  CO2 35* 38* 37*  BUN 17 16 15   CREATININE 1.11 1.03 1.14  GLUCOSE 90 98 105*    Recent Labs Lab 12/14/13 0412 12/16/13 0426 12/17/13 0422  HGB 11.0* 10.8* 10.4*  HCT 33.5* 33.3* 31.8*  WBC 15.2* 12.4* 10.7*  PLT 371 418* 417*   No results found.  PCXR right  > left patchy airspace disease. Actually improved since 4/18  ASSESSMENT / PLAN:  Acute respiratory failure in setting of R>L pulmonary infiltrates. Recurrent aspiration in setting of on going dysphagia from esophageal strictures vs CAP (NOS) vs viral pneumonitis, top the list on his diff dx of etiologies. Not clear to what extent his diastolic dysfunction may be contributing, but this is not the primary factor.  rec Ck resp viral panel  Continue empiric abx F/u cxr Trend PCT.  Cont lasix as bp and creatinine tolerate Will likely need repeat esophageal dilation as soon as clinically stable enough to tolerate  Attending Statement:  Lack of clinical improvement is likely  due to recurrent aspiration.  Unfortunately in this situation patient can be NPO and have a PEG placed versus accept that he will continue to aspirate and will likely be the terminal event for him.  Unfortunately there was no family to discuss this with.  With regards to the esophageal dilatation, would not recommend that at this time.  Would recommend some diureses and an additional day or two with abx then can proceed.  But again, the discussion of goals of care must be had so that the family understands that this will continue to be a recurrent issue at this patient's age and functional status.  Will continue to follow with you.  Rush Farmer, M.D. [facility] Pulmonary/Critical Care Medicine. Pager: [phone]. After hours pager: [phone].

## 2013-12-17 NOTE — Progress Notes (Signed)
Physical Therapy Treatment Patient Details Name: KHAYDEN HERZBERG MRN: [id number] DOB: 1925-08-20 Today's Date: 12/17/2013    History of Present Illness 80 yo presents with coughinh, SOB, DOE, and xrAY + for Bil pneumonia.     PT Comments    Pt in bed on 4 lts at rest 96%.  Amb with RW sats decreased to 92% on 4 lts.  On 2 lts sats decreased to 88%.  Noted 2/4 DOE requiring one sitting rest break.  Mod cough.  Mod c/o fatigue.  Follow Up Recommendations  Home health PT;Supervision/Assistance - 24 hour     Equipment Recommendations  Rolling walker with 5" wheels (pt stated he "might" already have one)    Recommendations for Other Services       Precautions / Restrictions Precautions Precaution Comments: monitor O2 Restrictions Weight Bearing Restrictions: No    Mobility  Bed Mobility Overal bed mobility: Needs Assistance Bed Mobility: Supine to Sit;Sit to Supine     Supine to sit: Supervision;Min guard Sit to supine: Supervision;Min guard   General bed mobility comments: increased time  Transfers Overall transfer level: Needs assistance Equipment used: Rolling walker (2 wheeled) Transfers: Sit to/from Stand Sit to Stand: Min guard;Min assist         General transfer comment: cues for placement and control of descent  Ambulation/Gait Ambulation/Gait assistance: Min guard;Supervision Ambulation Distance (Feet): 120 Feet (60 x 2 sitting rest break) Assistive device: Rolling walker (2 wheeled) Gait Pattern/deviations: Step-through pattern;Trunk flexed Gait velocity: WFL   General Gait Details: cues for RW safety esp with turns   Stairs            Wheelchair Mobility    Modified Rankin (Stroke Patients Only)       Balance                                    Cognition                            Exercises      General Comments        Pertinent Vitals/Pain     Home Living                      Prior  Function            PT Goals (current goals can now be found in the care plan section) Progress towards PT goals: Progressing toward goals    Frequency  Min 3X/week    PT Plan      Co-evaluation             End of Session Equipment Utilized During Treatment: Gait belt;Oxygen (4 lts) Activity Tolerance: Patient tolerated treatment well Patient left: in bed;with call bell/phone within reach     Time: 1421-1447 PT Time Calculation (min): 26 min  Charges:  $Gait Training: 23-37 mins                    G Codes:      Rica Koyanagi  PTA [facility]  Acute  Rehab Pager      [phone]

## 2013-12-17 NOTE — Progress Notes (Signed)
ANTIBIOTIC CONSULT NOTE - FOLLOW UP  Pharmacy Consult for Vancomycin Indication: rule out sepsis  No Known Allergies  Patient Measurements: Height: 5\' 6"  (167.6 cm) Weight: 140 lb 4.8 oz (63.64 kg) IBW/kg (Calculated) : 63.8 Adjusted Body Weight:   Vital Signs: Temp: 98.5 F (36.9 C) (04/21 0526) Temp src: Oral (04/21 0526) BP: 135/56 mmHg (04/21 0526) Pulse Rate: 93 (04/21 0526) Intake/Output from previous day: 04/20 0701 - 04/21 0700 In: 730 [P.O.:480; IV Piggyback:250] Out: 900 [Urine:900] Intake/Output from this shift: Total I/O In: 50 [IV Piggyback:50] Out: 100 [Urine:100]  Labs:  Recent Labs  12/15/13 0543 12/16/13 0426 12/17/13 0422  WBC  --  12.4* 10.7*  HGB  --  10.8* 10.4*  PLT  --  418* 417*  CREATININE 1.11 1.03 1.14   Estimated Creatinine Clearance: 41.8 ml/min (by C-G formula based on Cr of 1.14).  Recent Labs  12/15/13 0543 12/17/13 0422  VANCOTROUGH 11.2 18.8     Microbiology: Recent Results (from the past 720 hour(s))  CULTURE, BLOOD (ROUTINE X 2)     Status: None   Collection Time    12/12/13  5:21 PM      Result Value Ref Range Status   Specimen Description BLOOD RIGHT ANTECUBITAL   Final   Special Requests BOTTLES DRAWN AEROBIC AND ANAEROBIC [facility]   Final   Culture  Setup Time     Final   Value: 12/12/2013 20:03     Performed at [facility]   Culture     Final   Value:        BLOOD CULTURE RECEIVED NO GROWTH TO DATE CULTURE WILL BE HELD FOR 5 DAYS BEFORE ISSUING A FINAL NEGATIVE REPORT     Performed at [facility]   Report Status PENDING   Incomplete  CULTURE, BLOOD (ROUTINE X 2)     Status: None   Collection Time    12/12/13  5:28 PM      Result Value Ref Range Status   Specimen Description BLOOD LEFT HAND   Final   Special Requests BOTTLES DRAWN AEROBIC AND ANAEROBIC 5CC   Final   Culture  Setup Time     Final   Value: 12/12/2013 20:04     Performed at [facility]   Culture     Final   Value:         BLOOD CULTURE RECEIVED NO GROWTH TO DATE CULTURE WILL BE HELD FOR 5 DAYS BEFORE ISSUING A FINAL NEGATIVE REPORT     Performed at [facility]   Report Status PENDING   Incomplete  CULTURE, EXPECTORATED SPUTUM-ASSESSMENT     Status: None   Collection Time    12/13/13  9:31 AM      Result Value Ref Range Status   Specimen Description SPUTUM   Final   Special Requests Normal   Final   Sputum evaluation     Final   Value: MICROSCOPIC FINDINGS SUGGEST THAT THIS SPECIMEN IS NOT REPRESENTATIVE OF LOWER RESPIRATORY SECRETIONS. PLEASE RECOLLECT.     Ledell Noss RN AT 0945 ON 04.17.15 BY SHUEA   Report Status 12/13/2013 FINAL   Final    Anti-infectives   Start     Dose/Rate Route Frequency Ordered Stop   12/15/13 1800  vancomycin (VANCOCIN) IVPB 750 mg/150 ml premix     750 mg 150 mL/hr over 60 Minutes Intravenous Every 12 hours 12/15/13 0636     12/13/13 0600  vancomycin (VANCOCIN) 500 mg  in sodium chloride 0.9 % 100 mL IVPB  Status:  Discontinued     500 mg 100 mL/hr over 60 Minutes Intravenous Every 12 hours 12/12/13 1651 12/15/13 0636   12/12/13 1800  piperacillin-tazobactam (ZOSYN) IVPB 3.375 g     3.375 g 12.5 mL/hr over 240 Minutes Intravenous Every 8 hours 12/12/13 1651     12/12/13 1730  vancomycin (VANCOCIN) IVPB 750 mg/150 ml premix     750 mg 150 mL/hr over 60 Minutes Intravenous  Once 12/12/13 1651 12/12/13 1940   12/12/13 1515  cefTRIAXone (ROCEPHIN) 1 g in dextrose 5 % 50 mL IVPB  Status:  Discontinued     1 g 100 mL/hr over 30 Minutes Intravenous Every 24 hours 12/12/13 1501 12/12/13 1638   12/12/13 1515  azithromycin (ZITHROMAX) 500 mg in dextrose 5 % 250 mL IVPB  Status:  Discontinued     500 mg 250 mL/hr over 60 Minutes Intravenous Every 24 hours 12/12/13 1501 12/12/13 1638      Assessment: Patient with vancomycin at goal.  Prior doses charted appropriately.    Goal of Therapy:  Vancomycin trough level 15-20 mcg/ml  Plan:  Measure antibiotic drug  levels at steady state Follow up culture results Continue with current dosing, recheck level at least weekly  [employer]. 12/17/2013,5:54 AM

## 2013-12-17 NOTE — Progress Notes (Signed)
Patient ID: [name], male   DOB: 1925-06-18, 80 y.o.   MRN: [id number]  [facility] PROGRESS NOTE  [name] WUJ:[id number] DOB: 1925-04-23 DOA: 12/12/2013 PCP: [name], MD  Brief narrative:  80 y.o. male with PMH of HTN, HPL, esophageral stricture, s/p dilatation (2013), h/o Asthma, tobacco use, presented with SOB, DOE, associated with productive cough, fever, chills for several days, associated with nausea and one episode of non bloody vomiting, poor oral intake, reports his wife is having similar symptoms; denies chest pain, dizziness, diarrhea. Admitted for management of PNA.   Principal Problem:  Acute hypoxic respiratory failure with hypoxia  - secondary to PNA, ? CAP vs aspiration, diastolic CHF  - continue Vancomycin and Zosyn day #6 - sputum culutre pending but so far negative to date  - urine legionella and strep pneumo negative  - influenza panel negative  - will ask PCCM for further input  Active Problems:  Pulmonary vascular congestion  - noted on CXR and pt with crackles on exam, BNP > 2000 --> 1600 (4/21) - 2 D ECHO with normal systolic function and grade I diastolic dysfunction  - continue lasix but will increase the dose from 20 mg to 40 mg IV QD as pt sounds more congested and weight is up since yesterday, monitor daily weights, Is and O's  - weight 137 lbs --> 139 lbs --. 140 lbs this AM  HYPERTENSION  - stable, reasonable inpatient control  Sepsis (leukocytosis, tachycardia, hypotension, fever)  - secondary to PNA  - ABX as noted above  - WBC is trending down since admission  - repeat CBC in AM  Hypokalemia  - from Lasix diuresis  - continue to supplement and repeat BMP in AM  Acute on chronic blood loss anemia  - slight drop in hg since admission  - no signs of bleeding  - CBC in AM  Dysphagia  - GI consult for esophageal dilatation  - procedure cancelled due to hypoxia   Consultants:  None Procedures/Studies:  Dg Chest 2 View  12/12/2013 The findings are consistent with bilateral interstitial pneumonia.  Dg Chest Port 1 View 12/14/2013 1. Increased patchy airspace opacity in the medial right lower lobe concerning for pneumonia. Asymmetric pulmonary edema is a less likely consideration. 2. Slightly increased bilateral interstitial opacities suspected to represent superimposed interstitial edema. Atypical infection/ inflammation is also possible.  Antibiotics:  Vancomycin 4/16 -->  Zosyn 4/16 -->  Code Status: DNR  Family Communication: Pt, son, sister in law at bedside  Disposition Plan: Home when medically stable  HPI/Subjective: No events overnight.   Objective: Filed Vitals:   12/16/13 2013 12/17/13 0526 12/17/13 0729 12/17/13 0815  BP: 122/66 135/56  140/83  Pulse: 51 93    Temp: 98.7 F (37.1 C) 98.5 F (36.9 C)  98.3 F (36.8 C)  TempSrc: Oral Oral  Oral  Resp: 24 20  22   Height:      Weight:  63.64 kg (140 lb 4.8 oz)    SpO2: 96% 97% 92% 88%    Intake/Output Summary (Last 24 hours) at 12/17/13 1135 Last data filed at 12/17/13 0744  Gross per 24 hour  Intake    440 ml  Output    900 ml  Net   -460 ml    Exam:   General:  Pt is alert, follows commands appropriately, not in acute distress  Cardiovascular: Regular rate and rhythm, S1/S2, no murmurs, no rubs, no gallops  Respiratory: Bilateral rales, no  wheezing, mild crackles at bases   Abdomen: Soft, non tender, non distended, bowel sounds present, no guarding  Extremities: No edema, pulses DP and PT palpable bilaterally  Neuro: Grossly nonfocal  Data Reviewed: Basic Metabolic Panel:  Recent Labs Lab 12/13/13 0355 12/14/13 0412 12/15/13 0543 12/16/13 0426 12/17/13 0422  NA 135* 138 136* 136* 136*  K 3.0* 3.5* 3.5* 3.5* 3.5*  CL 92* 94* 89* 91* 89*  CO2 30 32 35* 38* 37*  GLUCOSE 86 105* 90 98 105*  BUN 22 17 17 16 15   CREATININE 1.12 1.02 1.11 1.03 1.14  CALCIUM 8.3* 8.4 8.8 8.6 8.5   Liver Function Tests:  Recent  Labs Lab 12/12/13 1344  AST 36  ALT 32  ALKPHOS 110  BILITOT 0.7  PROT 7.1  ALBUMIN 2.5*   CBC:  Recent Labs Lab 12/12/13 1344 12/13/13 0355 12/14/13 0412 12/16/13 0426 12/17/13 0422  WBC 17.1* 14.2* 15.2* 12.4* 10.7*  HGB 11.4* 10.8* 11.0* 10.8* 10.4*  HCT 33.8* 32.7* 33.5* 33.3* 31.8*  MCV 80.7 81.5 82.1 82.6 81.7  PLT 349 328 371 418* 417*   Cardiac Enzymes:  Recent Labs Lab 12/12/13 1344  TROPONINI <0.30   Recent Results (from the past 240 hour(s))  CULTURE, BLOOD (ROUTINE X 2)     Status: None   Collection Time    12/12/13  5:21 PM      Result Value Ref Range Status   Specimen Description BLOOD RIGHT ANTECUBITAL   Final   Special Requests BOTTLES DRAWN AEROBIC AND ANAEROBIC [facility]   Final   Culture  Setup Time     Final   Value: 12/12/2013 20:03     Performed at [facility]   Culture     Final   Value:        BLOOD CULTURE RECEIVED NO GROWTH TO DATE CULTURE WILL BE HELD FOR 5 DAYS BEFORE ISSUING A FINAL NEGATIVE REPORT     Performed at [facility]   Report Status PENDING   Incomplete  CULTURE, BLOOD (ROUTINE X 2)     Status: None   Collection Time    12/12/13  5:28 PM      Result Value Ref Range Status   Specimen Description BLOOD LEFT HAND   Final   Special Requests BOTTLES DRAWN AEROBIC AND ANAEROBIC 5CC   Final   Culture  Setup Time     Final   Value: 12/12/2013 20:04     Performed at [facility]   Culture     Final   Value:        BLOOD CULTURE RECEIVED NO GROWTH TO DATE CULTURE WILL BE HELD FOR 5 DAYS BEFORE ISSUING A FINAL NEGATIVE REPORT     Performed at [facility]   Report Status PENDING   Incomplete  CULTURE, EXPECTORATED SPUTUM-ASSESSMENT     Status: None   Collection Time    12/13/13  9:31 AM      Result Value Ref Range Status   Specimen Description SPUTUM   Final   Special Requests Normal   Final   Sputum evaluation     Final   Value: MICROSCOPIC FINDINGS SUGGEST THAT THIS SPECIMEN IS NOT  REPRESENTATIVE OF LOWER RESPIRATORY SECRETIONS. PLEASE RECOLLECT.     Ledell Noss RN AT 1950 ON 04.17.15 BY SHUEA   Report Status 12/13/2013 FINAL   Final     Scheduled Meds: . albuterol  2.5 mg Nebulization QID  . aspirin EC  81 mg Oral Daily  . enoxaparin (LOVENOX) injection  40 mg Subcutaneous Q24H  . furosemide  20 mg Intravenous Daily  . pantoprazole  40 mg Oral Daily  . piperacillin-tazobactam (ZOSYN)  IV  3.375 g Intravenous Q8H  . simvastatin  20 mg Oral q1800  . sodium chloride  3 mL Intravenous Q12H  . vancomycin  750 mg Intravenous Q12H   Continuous Infusions: . sodium chloride Stopped (12/12/13 1640)  . sodium chloride 20 mL/hr at 12/16/13 [place], MD  [facility] Pager [phone]  If 7PM-7AM, please contact night-coverage [email or web address] Password [facility] 12/17/2013, 11:35 AM   LOS: 5 days

## 2013-12-17 NOTE — Brief Op Note (Signed)
12/12/2013 - 12/17/2013  8:28 AM  PATIENT:  [name]  80 y.o. male  PRE-OPERATIVE DIAGNOSIS:  Dysphagia; history of stricture  POST-OPERATIVE DIAGNOSIS:  * No post-op diagnosis entered *  PROCEDURE:  Procedure(s): ESOPHAGOGASTRODUODENOSCOPY (EGD) WITH PROPOFOL (N/A)  SURGEON:  Surgeon(s) and Role:    * Irene Shipper, MD - Primary  Nursing Note:  Patient assessed for stability by nurse and anesthesia.  Oxygen saturation 88% on 4L.  Decision made by anesthesia Rose to cancel procedure.  MD Henrene Pastor notified and report given.  Patient transported back to room stable condition.

## 2013-12-17 NOTE — Progress Notes (Signed)
[facility] Progress Note  Subjective:  Feels ok.  Was walking around the halls yesterday.  Understands why his procedure was cancelled and what the plan is for after discharge from GI standpoint.  Objective:  Vital signs in last 24 hours: Temp:  [97.9 F (36.6 C)-98.8 F (37.1 C)] 98.3 F (36.8 C) (04/21 0815) Pulse Rate:  [51-115] 93 (04/21 0526) Resp:  [20-24] 22 (04/21 0815) BP: (109-141)/(56-83) 140/83 mmHg (04/21 0815) SpO2:  [84 %-97 %] 88 % (04/21 0815) FiO2 (%):  [24 %] 24 % (04/20 1750) Weight:  [140 lb 4.8 oz (63.64 kg)] 140 lb 4.8 oz (63.64 kg) (04/21 0526) Last BM Date: 12/16/13 General:  Alert, elderly and frail, in NAD Heart:  Regular rate and rhythm Pulm:  Coarse BS B/L Abdomen:  Soft, non-distended. Normal bowel sounds.  Non-tender. Extremities:  Without edema. Neurologic:  Alert and  oriented x4;  grossly normal neurologically. Psych:  Alert and cooperative. Normal mood and affect.  Intake/Output from previous day: 04/20 0701 - 04/21 0700 In: 730 [P.O.:480; IV Piggyback:250] Out: 900 [Urine:900]  Lab Results:  Recent Labs  12/16/13 0426 12/17/13 0422  WBC 12.4* 10.7*  HGB 10.8* 10.4*  HCT 33.3* 31.8*  PLT 418* 417*   BMET  Recent Labs  12/15/13 0543 12/16/13 0426 12/17/13 0422  NA 136* 136* 136*  K 3.5* 3.5* 3.5*  CL 89* 91* 89*  CO2 35* 38* 37*  GLUCOSE 90 98 105*  BUN 17 16 15   CREATININE 1.11 1.03 1.14  CALCIUM 8.8 8.6 8.5   Assessment / Plan: -Dysphagia: History of distal esophageal stricture requiring dilation in 09/2011 now with recurrent solid food dysphagia x 3-4 months, which has been gradually progressive.  -PNA: ? Aspiration.  We do not think that his dysphagia and history of esophageal stricture are directly responsible for his pneumonia.  Was scheduled for EGD today, but anesthesia cancelled due to concern for his respiratory status. -HTN  -COPD  -History of TIA    *Continue daily PPI; receiving pantoprazole  40 mg by mouth daily.  *Continue dysphagia diet.  *Continue to treat PNA with appropriate antibiotics and we will see him in the office in about 4 weeks to reassess and reschedule his EGD (the procedure for 5/11 has been cancelled and our office will contact him in a couple of weeks to schedule his office visit).  Will sign off from GI standpoint; call with questions.      LOS: 5 days   Laban Emperor. Zehr  12/17/2013, 8:57 AM  Pager number [phone]   GI ATTENDING  Interval history and data reviewed. Patient seen and examined. Family in room. Somewhat worsening respiratory status overnight. Anesthesia canceled endoscopy due to hypoxemia and abnormal physical exam. I agree. As such, the patient needs to have his pneumonia completely and successfully treated. Thereafter I can see him in the office as an outpatient for reevaluation to reconsider endoscopy with dilation of a benign esophageal stricture. Will sign off.  Docia Chuck. Geri Seminole., M.D. [facility] Division of Gastroenterology

## 2013-12-17 NOTE — Anesthesia Preprocedure Evaluation (Addendum)
Anesthesia Evaluation  Patient identified by MRN, date of birth, ID band Patient awake    Reviewed: Allergy & Precautions, H&P , NPO status , Patient's Chart, lab work & pertinent test results  Airway Mallampati: II TM Distance: >3 FB Neck ROM: Full    Dental no notable dental hx.    Pulmonary pneumonia -, unresolved, former smoker,  80 y.o. male with PMH of HTN, HPL, esophageral stricture, s/p dilatation (2013), h/o Asthma, tobacco use, presented with SOB, DOE, associated with productive cough, fever, chills for several days, associated with nausea and one episode of non bloody vomiting, poor oral intake, reports his wife is having similar symptoms; denies chest pain, dizziness, diarrhea. Admitted for management of PNA.     breath sounds clear to auscultation+ rhonchi   + decreased breath sounds      Cardiovascular hypertension, Pt. on medications Rhythm:Regular Rate:Tachycardia  - Left ventricle: The cavity size was normal. There was mild   focal basal hypertrophy of the septum. Systolic function   was vigorous. The estimated ejection fraction was in the   range of 65% to 70%. Wall motion was normal; there were no   regional wall motion abnormalities. Doppler parameters are   consistent with abnormal left ventricular relaxation   (grade 1 diastolic dysfunction).    Neuro/Psych TIAnegative psych ROS   GI/Hepatic Neg liver ROS, GERD-  Medicated,  Endo/Other  negative endocrine ROS  Renal/GU negative Renal ROS  negative genitourinary   Musculoskeletal negative musculoskeletal ROS (+)   Abdominal   Peds negative pediatric ROS (+)  Hematology  (+) anemia ,   Anesthesia Other Findings   Reproductive/Obstetrics negative OB ROS                        Anesthesia Physical Anesthesia Plan  ASA: IV  Anesthesia Plan: MAC   Post-op Pain Management:    Induction: Intravenous  Airway Management  Planned: Nasal Cannula  Additional Equipment:   Intra-op Plan:   Post-operative Plan:   Informed Consent: I have reviewed the patients History and Physical, chart, labs and discussed the procedure including the risks, benefits and alternatives for the proposed anesthesia with the patient or authorized representative who has indicated his/her understanding and acceptance.   Dental advisory given  Plan Discussed with: CRNA and Surgeon  Anesthesia Plan Comments: (Patient in respiratory distress in holding area. Coarse BS B, cough with sputum, on 6L [place] with saturation in mid 80's  Case cancelled. Was previously scheduled on 5/11, will keep that appointment )       Anesthesia Quick Evaluation

## 2013-12-18 LAB — CULTURE, BLOOD (ROUTINE X 2)
Culture: NO GROWTH
Culture: NO GROWTH

## 2013-12-18 LAB — RESPIRATORY VIRUS PANEL
ADENOVIRUS: NOT DETECTED
Influenza A H1: NOT DETECTED
Influenza A H3: NOT DETECTED
Influenza A: NOT DETECTED
Influenza B: NOT DETECTED
Metapneumovirus: NOT DETECTED
PARAINFLUENZA 3 A: NOT DETECTED
Parainfluenza 1: NOT DETECTED
Parainfluenza 2: NOT DETECTED
RESPIRATORY SYNCYTIAL VIRUS B: NOT DETECTED
RHINOVIRUS: NOT DETECTED
Respiratory Syncytial Virus A: NOT DETECTED

## 2013-12-18 LAB — CBC
HCT: 33.2 % — ABNORMAL LOW (ref 39.0–52.0)
Hemoglobin: 10.6 g/dL — ABNORMAL LOW (ref 13.0–17.0)
MCH: 26.8 pg (ref 26.0–34.0)
MCHC: 31.9 g/dL (ref 30.0–36.0)
MCV: 83.8 fL (ref 78.0–100.0)
PLATELETS: 433 10*3/uL — AB (ref 150–400)
RBC: 3.96 MIL/uL — ABNORMAL LOW (ref 4.22–5.81)
RDW: 14.2 % (ref 11.5–15.5)
WBC: 11.4 10*3/uL — AB (ref 4.0–10.5)

## 2013-12-18 LAB — BASIC METABOLIC PANEL
BUN: 14 mg/dL (ref 6–23)
CO2: 38 mEq/L — ABNORMAL HIGH (ref 19–32)
CREATININE: 1.28 mg/dL (ref 0.50–1.35)
Calcium: 8.7 mg/dL (ref 8.4–10.5)
Chloride: 90 mEq/L — ABNORMAL LOW (ref 96–112)
GFR calc non Af Amer: 49 mL/min — ABNORMAL LOW (ref >90)
GFR, EST AFRICAN AMERICAN: 57 mL/min — AB (ref >90)
Glucose, Bld: 92 mg/dL (ref 70–99)
Potassium: 4.1 mEq/L (ref 3.7–5.3)
Sodium: 135 mEq/L — ABNORMAL LOW (ref 137–147)

## 2013-12-18 LAB — CLOSTRIDIUM DIFFICILE BY PCR: Toxigenic C. Difficile by PCR: NEGATIVE

## 2013-12-18 NOTE — Progress Notes (Addendum)
Speech Language Pathology Treatment: Dysphagia  Patient Details Name: [name] MRN: [id number] DOB: [birth year] Today's Date: 12/18/2013 Time: 1210-1240 SLP Time Calculation (min): 30 min  Assessment / Plan / Recommendation Clinical Impression  Pt seen finishing his lunch tray - including tea, soup, mashed potatoes.  No overt clinical indications of aspiration noted and swallow was timely with clear voice throughout.  No CN deficits impacting oropharyngeal swallow function present.     Pt denies any symptoms of reflux/backflow/food lodging in esophagus with intake.  SLP questions if he may be desensitized to esophageal issues due to chronicity of deficits.   Pt denies shortness of breath with po intake however sister reports occurrence.  Sister also reports pt would cough up secretions (never with food) when eating at restaurants prior to admission but states this has not occurred since being in hospital.  Pt and sister report pt has better tolerance of dysphagia2/thin diet.    From discussion with pt, his goal is to become well enough to be able to have esophageal stretching surgery and he desires to continue po intake even with increased aspiration risk.    SLP provided compensation strategies to pt/family to mitigate multifactorial aspiration risk.  If MD is concerned for overt silent aspiration re: oropharyngeal swallow, please order MBS.  Suspect from chart review, pt's primary asp risk is due to known esophageal deficits.  SLP to sign off unless MBS ordered as all education completed.      HPI HPI: [name] is a 80 y.o. male with PMH of HTN, HPL,esophageal stricture s/p dilatation (2013), h/o Asthma, tobacco use presented with SOB, DOE, associated with productive cough, fever, chills for several days, reports his wife is having similar symptoms; denies chest pain, no dizziness, had mild nausea, vomited x 1, no diarrhea per review of previous MD admit note.  BSE completed on  4/17 with dys2/thin diet initiated and GI planned to conduct EGD but pt had respiratory difficulties and surgery had to be cancelled.  CCS following pt and indicated chronic aspiration may be contributing to pt's pulmonary status.  Per RN, primary MD desired SLP to reevaluate pt.     Pertinent Vitals Intake has been great, afebrile, rhonchi, imaging study improved right greater than left pna  SLP Plan  All goals met    Recommendations Diet recommendations: Dysphagia 2 (fine chop);Thin liquid Medication Administration:  (as tolerated) Supervision: Patient able to self feed;Intermittent supervision to cue for compensatory strategies Compensations: Slow rate;Small sips/bites;Follow solids with liquid (start meal with liquids, consume several small meals) Postural Changes and/or Swallow Maneuvers: Seated upright 90 degrees;Upright 30-60 min after meal              Oral Care Recommendations: Oral care BID Follow up Recommendations: None Plan: All goals met    [place], [place] [facility] SLP [phone]

## 2013-12-18 NOTE — Progress Notes (Signed)
[facility] PROGRESS NOTE  [name] RSW:[id number] DOB: Jan 13, 1925 DOA: 12/12/2013 PCP: [name], MD  Brief narrative:  80 y.o. male with PMH of HTN, HPL, esophageral stricture, s/p dilatation (2013), h/o Asthma, tobacco use, presented with SOB, DOE, associated with productive cough, fever, chills for several days, associated with nausea and one episode of non bloody vomiting, poor oral intake, reports his wife is having similar symptoms; denies chest pain, dizziness, diarrhea. Admitted for management of PNA.   Principal Problem:   Acute hypoxic respiratory failure with hypoxia  - secondary to PNA, ? CAP vs aspiration, diastolic CHF  - continue Vancomycin and Zosyn day #7, can narrow to oral Abx on discharge - his aspiration is related to his known esophageal issues, will have follow up with GI as an outpatient. If he continues to have aspiration after dilatation, they will likely need to consider PEG tube. Last time he underwent and EGD with dilation in 2013 and had no problems with dysphagia until recently. SLP to see patient - sputum culutre pending but so far negative to date  - urine legionella and strep pneumo negative  - influenza panel negative  - will ask PCCM for further input    Pulmonary vascular congestion  - noted on CXR and pt with crackles on exam, BNP > 2000 --> 1600 (4/21) - 2 D ECHO with normal systolic function and grade I diastolic dysfunction  - continue lasix but will increase the dose from 20 mg to 40 mg IV QD as pt sounds more congested and weight is up since yesterday, monitor daily weights, Is and O's  - weight 137 lbs >> 139 lbs >> 140 lbs >> 137   HYPERTENSION - stable, reasonable inpatient control   Sepsis (leukocytosis, tachycardia, hypotension, fever)  - secondary to PNA  - ABX as noted above  - WBC is trending down since admission  - repeat CBC in AM   Hypokalemia  - from Lasix diuresis  - continue to supplement and repeat  BMP in AM   Acute on chronic blood loss anemia  - slight drop in hg since admission  - no signs of bleeding   Dysphagia  - GI consult for esophageal dilatation  - procedure cancelled due to hypoxia   Consultants:  GI PCCM   Procedures/Studies:  Dg Chest 2 View 12/12/2013 The findings are consistent with bilateral interstitial pneumonia.  Dg Chest Port 1 View 12/14/2013 1. Increased patchy airspace opacity in the medial right lower lobe concerning for pneumonia. Asymmetric pulmonary edema is a less likely consideration. 2. Slightly increased bilateral interstitial opacities suspected to represent superimposed interstitial edema. Atypical infection/ inflammation is also possible.   Antibiotics:  Vancomycin 4/16 -->  Zosyn 4/16 -->  Code Status: DNR  Family Communication: Pt, son, sister in law at bedside  Disposition Plan: Home when medically stable  HPI/Subjective: No events overnight.   Objective: Filed Vitals:   12/17/13 1607 12/17/13 2030 12/18/13 0526 12/18/13 0757  BP:  110/68 144/71   Pulse:  106 74   Temp:  98.5 F (36.9 C) 98.3 F (36.8 C)   TempSrc:  Oral Oral   Resp:  18 21   Height:      Weight:   62.4 kg (137 lb 9.1 oz)   SpO2: 93% 100% 97% 92%    Intake/Output Summary (Last 24 hours) at 12/18/13 0923 Last data filed at 12/18/13 0553  Gross per 24 hour  Intake    370 ml  Output   1825 ml  Net  -1455 ml   Exam:  General:  Pt is alert, follows commands appropriately, not in acute distress  Cardiovascular: Regular rate and rhythm, S1/S2, no murmurs, no rubs, no gallops  Respiratory: Bilateral rales, no wheezing, mild crackles at bases   Abdomen: Soft, non tender, non distended, bowel sounds present, no guarding  Extremities: No edema, pulses DP and PT palpable bilaterally  Neuro: Grossly nonfocal  Data Reviewed: Basic Metabolic Panel:  Recent Labs Lab 12/14/13 0412 12/15/13 0543 12/16/13 0426 12/17/13 0422 12/18/13 0441  NA 138 136*  136* 136* 135*  K 3.5* 3.5* 3.5* 3.5* 4.1  CL 94* 89* 91* 89* 90*  CO2 32 35* 38* 37* 38*  GLUCOSE 105* 90 98 105* 92  BUN 17 17 16 15 14   CREATININE 1.02 1.11 1.03 1.14 1.28  CALCIUM 8.4 8.8 8.6 8.5 8.7   Liver Function Tests:  Recent Labs Lab 12/12/13 1344  AST 36  ALT 32  ALKPHOS 110  BILITOT 0.7  PROT 7.1  ALBUMIN 2.5*   CBC:  Recent Labs Lab 12/13/13 0355 12/14/13 0412 12/16/13 0426 12/17/13 0422 12/18/13 0441  WBC 14.2* 15.2* 12.4* 10.7* 11.4*  HGB 10.8* 11.0* 10.8* 10.4* 10.6*  HCT 32.7* 33.5* 33.3* 31.8* 33.2*  MCV 81.5 82.1 82.6 81.7 83.8  PLT 328 371 418* 417* 433*   Cardiac Enzymes:  Recent Labs Lab 12/12/13 1344  TROPONINI <0.30   Recent Results (from the past 240 hour(s))  CULTURE, BLOOD (ROUTINE X 2)     Status: None   Collection Time    12/12/13  5:21 PM      Result Value Ref Range Status   Specimen Description BLOOD RIGHT ANTECUBITAL   Final   Special Requests BOTTLES DRAWN AEROBIC AND ANAEROBIC [facility]   Final   Culture  Setup Time     Final   Value: 12/12/2013 20:03     Performed at [facility]   Culture     Final   Value: NO GROWTH 5 DAYS     Performed at [facility]   Report Status 12/18/2013 FINAL   Final  CULTURE, BLOOD (ROUTINE X 2)     Status: None   Collection Time    12/12/13  5:28 PM      Result Value Ref Range Status   Specimen Description BLOOD LEFT HAND   Final   Special Requests BOTTLES DRAWN AEROBIC AND ANAEROBIC 5CC   Final   Culture  Setup Time     Final   Value: 12/12/2013 20:04     Performed at [facility]   Culture     Final   Value: NO GROWTH 5 DAYS     Performed at [facility]   Report Status 12/18/2013 FINAL   Final  CULTURE, EXPECTORATED SPUTUM-ASSESSMENT     Status: None   Collection Time    12/13/13  9:31 AM      Result Value Ref Range Status   Specimen Description SPUTUM   Final   Special Requests Normal   Final   Sputum evaluation     Final   Value: MICROSCOPIC  FINDINGS SUGGEST THAT THIS SPECIMEN IS NOT REPRESENTATIVE OF LOWER RESPIRATORY SECRETIONS. PLEASE RECOLLECT.     Ledell Noss RN AT 1610 ON 04.17.15 BY SHUEA   Report Status 12/13/2013 FINAL   Final     Scheduled Meds: . albuterol  2.5 mg Nebulization QID  . aspirin EC  81 mg Oral Daily  . enoxaparin (LOVENOX) injection  40 mg Subcutaneous Q24H  . furosemide  40 mg Intravenous Daily  . pantoprazole  40 mg Oral Daily  . piperacillin-tazobactam (ZOSYN)  IV  3.375 g Intravenous Q8H  . potassium chloride  40 mEq Oral Daily  . simvastatin  20 mg Oral q1800  . sodium chloride  3 mL Intravenous Q12H  . vancomycin  750 mg Intravenous Q12H   Continuous Infusions: . sodium chloride Stopped (12/12/13 1640)  . sodium chloride 20 mL/hr at 12/16/13 2310     Time spent: 35 min   [place] Karlyne Greenspan, MD  Pager [phone]  If 7PM-7AM, please contact night-coverage [email or web address] Password [facility] 12/18/2013, 9:23 AM   LOS: 6 days

## 2013-12-18 NOTE — Progress Notes (Signed)
Name: DAILEN MCCLISH MRN: [id number] DOB: [birth year]    ADMISSION DATE:  12/12/2013 CONSULTATION DATE:  4/21  REFERRING MD :  Doyle Askew PRIMARY SERVICE:  [facility]  CHIEF COMPLAINT:  Pneumonia/hypoxia   BRIEF PATIENT DESCRIPTION:  80 year old male w/ known h/o esophageal strictures, requiring prior dilation. Has had worsening progression of dysphagia over the last 3-4 mo. To point food not passing. Admitted on 4/16 w/ working dx of PNA                (aspiration vs CAP) as had sick family member. Initially improved some w/ empiric abx. Was planned for endoscopy to eval stricture on 4/21. Procedure cancelled due to hypoxia w/ O2 sat in 80s on 6 liters. PCCM asked to see in setting of hypoxia and PNA  SIGNIFICANT EVENTS / STUDIES:  Echo 4/19: EF 65-70%. gd I d-dysfxn   LINES / TUBES:   CULTURES: 4/16: U strep and legionella: neg 4/16 infuenza PCR: neg   ANTIBIOTICS:  zosyn 4/16 >>> Vanc 4/16>>>4/22   SUBJECTIVE:  No acute distress  VITAL SIGNS: Temp:  [98 F (36.7 C)-98.5 F (36.9 C)] 98.3 F (36.8 C) (04/22 0526) Pulse Rate:  [74-112] 74 (04/22 0526) Resp:  [18-21] 21 (04/22 0526) BP: (108-144)/(63-71) 144/71 mmHg (04/22 0526) SpO2:  [92 %-100 %] 92 % (04/22 0757) Weight:  [62.4 kg (137 lb 9.1 oz)] 62.4 kg (137 lb 9.1 oz) (04/22 0526) 4 liters  PHYSICAL EXAMINATION: General:  No acute distress  Neuro:  Awake, alert, hard of hearing  HEENT:  [place], no JVD  Cardiovascular:  rrr Lungs:  Scattered rhonchi no accessory muscle use  Abdomen:  Soft, non-tender + bowel sounds  Musculoskeletal:  Intact w/out edema    Recent Labs Lab 12/16/13 0426 12/17/13 0422 12/18/13 0441  NA 136* 136* 135*  K 3.5* 3.5* 4.1  CL 91* 89* 90*  CO2 38* 37* 38*  BUN 16 15 14   CREATININE 1.03 1.14 1.28  GLUCOSE 98 105* 92    Recent Labs Lab 12/16/13 0426 12/17/13 0422 12/18/13 0441  HGB 10.8* 10.4* 10.6*  HCT 33.3* 31.8* 33.2*  WBC 12.4* 10.7* 11.4*  PLT 418* 417* 433*   Dg Chest  Port 1 View  12/17/2013   CLINICAL DATA:  Pneumonia.  Cough.  EXAM: PORTABLE CHEST - 1 VIEW  COMPARISON:  Plain film of the chest 12/14/2013.  FINDINGS: Right basilar airspace disease persists but has improved. Patchy airspace opacity in left lung base is also improved. There is no pneumothorax or pleural effusion. Heart size is normal.  IMPRESSION: Improved patchy bibasilar airspace disease, worse on the right.   Electronically Signed   By: Inge Rise M.D.   On: 12/17/2013 17:16    PCXR right > left patchy airspace disease. Actually improved since 4/18  ASSESSMENT / PLAN:  Acute respiratory failure in setting of R>L pulmonary infiltrates. Recurrent aspiration in setting of on going dysphagia from esophageal strictures vs CAP (NOS) vs viral pneumonitis, top the list on his diff dx of etiologies. Not clear to what extent his diastolic dysfunction may be contributing, but this is not the primary factor.  > little better today.  rec f/u resp viral panel  Continue empiric abx Cont lasix as bp and creatinine tolerate Will likely need repeat esophageal dilation as soon as clinically stable enough to tolerate See Note from 4/21 re: possible need for alternative means of swallowing, particularly if dilation not completed or not successful.   [place] Wadas G.  Nelda Marseille, M.D. [facility] Pulmonary/Critical Care Medicine. Pager: [phone]. After hours pager: [phone].

## 2013-12-19 ENCOUNTER — Encounter: Payer: Self-pay | Admitting: Internal Medicine

## 2013-12-19 LAB — PROCALCITONIN: Procalcitonin: 0.1 ng/mL

## 2013-12-19 LAB — CREATININE, SERUM
Creatinine, Ser: 1.43 mg/dL — ABNORMAL HIGH (ref 0.50–1.35)
GFR calc Af Amer: 50 mL/min — ABNORMAL LOW (ref >90)
GFR calc non Af Amer: 43 mL/min — ABNORMAL LOW (ref >90)

## 2013-12-19 MED ORDER — IPRATROPIUM-ALBUTEROL 0.5-2.5 (3) MG/3ML IN SOLN
3.0000 mL | Freq: Three times a day (TID) | RESPIRATORY_TRACT | Status: DC
  Administered 2013-12-19 – 2013-12-20 (×3): 3 mL via RESPIRATORY_TRACT
  Filled 2013-12-19 (×3): qty 3

## 2013-12-19 MED ORDER — AMOXICILLIN-POT CLAVULANATE 875-125 MG PO TABS
1.0000 | ORAL_TABLET | Freq: Two times a day (BID) | ORAL | Status: DC
  Administered 2013-12-19 – 2013-12-20 (×3): 1 via ORAL
  Filled 2013-12-19 (×4): qty 1

## 2013-12-19 NOTE — Progress Notes (Signed)
Name: [name] MRN: [id number] DOB: May 24, 1925    ADMISSION DATE:  12/12/2013 CONSULTATION DATE:  4/21  REFERRING MD :  Doyle Askew PRIMARY SERVICE:  [facility]  CHIEF COMPLAINT:  Pneumonia/hypoxia   BRIEF PATIENT DESCRIPTION:  80 year old male w/ known h/o esophageal strictures, requiring prior dilation. Has had worsening progression of dysphagia over the last 3-4 mo. To point food not passing. Admitted on 4/16 w/ working dx of PNA                (aspiration vs CAP) as had sick family member. Initially improved some w/ empiric abx. Was planned for endoscopy to eval stricture on 4/21. Procedure cancelled due to hypoxia w/ O2 sat in 80s on 6 liters. PCCM asked to see in setting of hypoxia and PNA  SIGNIFICANT EVENTS / STUDIES:  Echo 4/19: EF 65-70%. gd I d-dysfxn   LINES / TUBES:  CULTURES: 4/16: U strep and legionella: neg 4/16 infuenza PCR: neg   ANTIBIOTICS:  zosyn 4/16 >>> Vanc 4/16>>>4/22  SUBJECTIVE:  No acute distress   VITAL SIGNS: Temp:  [97.4 F (36.3 C)-98.1 F (36.7 C)] 97.4 F (36.3 C) (04/23 0540) Pulse Rate:  [87-103] 87 (04/23 0540) Resp:  [18-20] 18 (04/23 0540) BP: (117-139)/(72-83) 126/72 mmHg (04/23 0540) SpO2:  [96 %-98 %] 97 % (04/23 0835) Weight:  [132 lb 11.5 oz (60.2 kg)] 132 lb 11.5 oz (60.2 kg) (04/23 0540) 4 liters  PHYSICAL EXAMINATION: General:  No acute distress  Neuro:  Awake, alert, hard of hearing  HEENT:  [place], no JVD  Cardiovascular:  rrr Lungs:  Scattered rhonchi no accessory muscle use  Abdomen:  Soft, non-tender + bowel sounds  Musculoskeletal:  Intact w/out edema    Recent Labs Lab 12/16/13 0426 12/17/13 0422 12/18/13 0441 12/19/13 0520  NA 136* 136* 135*  --   K 3.5* 3.5* 4.1  --   CL 91* 89* 90*  --   CO2 38* 37* 38*  --   BUN 16 15 14   --   CREATININE 1.03 1.14 1.28 1.43*  GLUCOSE 98 105* 92  --     Recent Labs Lab 12/16/13 0426 12/17/13 0422 12/18/13 0441  HGB 10.8* 10.4* 10.6*  HCT 33.3* 31.8* 33.2*  WBC  12.4* 10.7* 11.4*  PLT 418* 417* 433*   Dg Chest Port 1 View  12/17/2013   CLINICAL DATA:  Pneumonia.  Cough.  EXAM: PORTABLE CHEST - 1 VIEW  COMPARISON:  Plain film of the chest 12/14/2013.  FINDINGS: Right basilar airspace disease persists but has improved. Patchy airspace opacity in left lung base is also improved. There is no pneumothorax or pleural effusion. Heart size is normal.  IMPRESSION: Improved patchy bibasilar airspace disease, worse on the right.   Electronically Signed   By: Inge Rise M.D.   On: 12/17/2013 17:16    PCXR right > left patchy airspace disease. Actually improved since 4/18  ASSESSMENT / PLAN:  Acute respiratory failure in setting of R>L pulmonary infiltrates. Recurrent aspiration in setting of on going dysphagia from esophageal strictures vs CAP (NOS) vs viral pneumonitis, top the list on his diff dx of etiologies. Not clear to what extent his diastolic dysfunction may be contributing, but this is not the primary factor.  > little better today.  rec - Resp viral panel negative. - Continue empiric abx - Cont lasix as bp and creatinine tolerate. - May proceed with dilatation from a respiratory standpoint as I do not anticipate patient will  improve much more than current condition given recurrent aspiration. - See Note from 4/21 re: possible need for alternative means of swallowing, particularly if dilation not completed or not successful.  - PCCM will sign off, please call back if needed.  Rush Farmer, M.D. [facility] Pulmonary/Critical Care Medicine. Pager: [phone]. After hours pager: [phone].

## 2013-12-19 NOTE — Progress Notes (Signed)
[facility] PROGRESS NOTE   [name] YJE:[id number] DOB: [birth year] DOA: 12/12/2013 PCP: [name], MD  Brief narrative:   80 y.o. male with PMH of HTN, HPL, esophageral stricture, s/p dilatation (2013), h/o Asthma, tobacco use, presented with SOB, DOE, associated with productive cough, fever, chills for several days, associated with nausea and one episode of non bloody vomiting, poor oral intake, reports his wife is having similar symptoms; denies chest pain, dizziness, diarrhea. Admitted for management of PNA.    Principal Problem:   Acute hypoxic respiratory failure with hypoxia  - secondary to PNA, ? CAP vs aspiration, diastolic CHF  - continue Vancomycin and Zosyn day #8, narrow to Augmentin today - his aspiration is related to his known esophageal issues, will have follow up with GI as an outpatient. If he continues to have aspiration after dilatation, they will likely need to consider PEG tube, which patient now is adamantly against. Last time he underwent and EGD with dilation in 2013 and had no problems with dysphagia for couple of years. - sputum culutre pending but so far negative to date  - urine legionella and strep pneumo negative  - influenza panel negative   Pulmonary vascular congestion  - noted on CXR and pt with crackles on exam, BNP > 2000 --> 1600 (4/21) - 2 D ECHO with normal systolic function and grade I diastolic dysfunction  - continue lasix but will increase the dose from 20 mg to 40 mg IV QD as pt sounds more congested and weight is up since yesterday, monitor daily weights, Is and O's  - weight 137 lbs >> 139 lbs >> 140 lbs >> 137 >>132 - mild AKI 4/23, hold Lasix  HYPERTENSION - stable, reasonable inpatient control   Sepsis (leukocytosis, tachycardia, hypotension, fever)  - secondary to PNA  - ABX as noted above  - WBC is trending down since admission  - repeat CBC in AM   Hypokalemia  - from Lasix diuresis  - continue to  supplement and repeat BMP in AM   Acute on chronic blood loss anemia  - slight drop in hg since admission  - no signs of bleeding   Dysphagia  - GI consult for esophageal dilatation  - procedure cancelled due to hypoxia    Consultants:  GI PCCM   Procedures/Studies:  Dg Chest 2 View 12/12/2013 The findings are consistent with bilateral interstitial pneumonia.  Dg Chest Port 1 View 12/14/2013 1. Increased patchy airspace opacity in the medial right lower lobe concerning for pneumonia. Asymmetric pulmonary edema is a less likely consideration. 2. Slightly increased bilateral interstitial opacities suspected to represent superimposed interstitial edema. Atypical infection/ inflammation is also possible.   Antibiotics:  Vancomycin 4/16 >> 4/23 Zosyn 4/16 >> 4/23 Augmentin 4/23 >>    Code Status: DNR  Family Communication: Pt, son, sister in law at bedside  Disposition Plan: Home when medically stable   HPI/Subjective: No events overnight.    Objective: Filed Vitals:   12/18/13 1630 12/18/13 2134 12/19/13 0540 12/19/13 0835  BP:  117/75 126/72   Pulse:  91 87   Temp:  98 F (36.7 C) 97.4 F (36.3 C)   TempSrc:  Oral Oral   Resp:  18 18   Height:      Weight:   60.2 kg (132 lb 11.5 oz)   SpO2: 96% 98% 97% 97%    Intake/Output Summary (Last 24 hours) at 12/19/13 0905 Last data filed at 12/19/13 0541  Gross  per 24 hour  Intake   1090 ml  Output   1850 ml  Net   -760 ml   Exam:  General:  Pt is alert, follows commands appropriately, not in acute distress  Cardiovascular: Regular rate and rhythm, S1/S2, no murmurs, no rubs, no gallops  Respiratory: Bilateral rales, no wheezing, mild crackles at bases   Abdomen: Soft, non tender, non distended, bowel sounds present, no guarding  Extremities: No edema, pulses DP and PT palpable bilaterally  Neuro: Grossly nonfocal  Data Reviewed: Basic Metabolic Panel:  Recent Labs Lab 12/14/13 0412 12/15/13 0543  12/16/13 0426 12/17/13 0422 12/18/13 0441 12/19/13 0520  NA 138 136* 136* 136* 135*  --   K 3.5* 3.5* 3.5* 3.5* 4.1  --   CL 94* 89* 91* 89* 90*  --   CO2 32 35* 38* 37* 38*  --   GLUCOSE 105* 90 98 105* 92  --   BUN _0 --   CREATININE 1.02 1.11 1.03 1.14 1.28 1.43*  CALCIUM 8.4 8.8 8.6 8.5 8.7  --    Liver Function Tests:  Recent Labs Lab 12/12/13 1344  AST 36  ALT 32  ALKPHOS 110  BILITOT 0.7  PROT 7.1  ALBUMIN 2.5*   CBC:  Recent Labs Lab 12/13/13 0355 12/14/13 0412 12/16/13 0426 12/17/13 0422 12/18/13 0441  WBC 14.2* 15.2* 12.4* 10.7* 11.4*  HGB 10.8* 11.0* 10.8* 10.4* 10.6*  HCT 32.7* 33.5* 33.3* 31.8* 33.2*  MCV 81.5 82.1 82.6 81.7 83.8  PLT 328 371 418* 417* 433*   Cardiac Enzymes:  Recent Labs Lab 12/12/13 1344  TROPONINI <0.30   Recent Results (from the past 240 hour(s))  CULTURE, BLOOD (ROUTINE X 2)     Status: None   Collection Time    12/12/13  5:21 PM      Result Value Ref Range Status   Specimen Description BLOOD RIGHT ANTECUBITAL   Final   Special Requests BOTTLES DRAWN AEROBIC AND ANAEROBIC [facility]   Final   Culture  Setup Time     Final   Value: 12/12/2013 20:03     Performed at [facility]   Culture     Final   Value: NO GROWTH 5 DAYS     Performed at [facility]   Report Status 12/18/2013 FINAL   Final  CULTURE, BLOOD (ROUTINE X 2)     Status: None   Collection Time    12/12/13  5:28 PM      Result Value Ref Range Status   Specimen Description BLOOD LEFT HAND   Final   Special Requests BOTTLES DRAWN AEROBIC AND ANAEROBIC 5CC   Final   Culture  Setup Time     Final   Value: 12/12/2013 20:04     Performed at [facility]   Culture     Final   Value: NO GROWTH 5 DAYS     Performed at [facility]   Report Status 12/18/2013 FINAL   Final  CULTURE, EXPECTORATED SPUTUM-ASSESSMENT     Status: None   Collection Time    12/13/13  9:31 AM      Result Value Ref Range Status   Specimen  Description SPUTUM   Final   Special Requests Normal   Final   Sputum evaluation     Final   Value: MICROSCOPIC FINDINGS SUGGEST THAT THIS SPECIMEN IS NOT REPRESENTATIVE OF LOWER RESPIRATORY SECRETIONS. PLEASE RECOLLECT.     INFORMED C  HOURINE RN AT 3354 ON 04.17.15 BY SHUEA   Report Status 12/13/2013 FINAL   Final  RESPIRATORY VIRUS PANEL     Status: None   Collection Time    12/17/13  4:00 PM      Result Value Ref Range Status   Source - [facility] NASAL WASHINGS   Corrected   Comment: CORRECTED ON 04/22 AT 1814: PREVIOUSLY REPORTED AS NASAL WASHINGS   Respiratory Syncytial Virus A NOT DETECTED   Final   Respiratory Syncytial Virus B NOT DETECTED   Final   Influenza A NOT DETECTED   Final   Influenza B NOT DETECTED   Final   Parainfluenza 1 NOT DETECTED   Final   Parainfluenza 2 NOT DETECTED   Final   Parainfluenza 3 NOT DETECTED   Final   Metapneumovirus NOT DETECTED   Final   Rhinovirus NOT DETECTED   Final   Adenovirus NOT DETECTED   Final   Influenza A H1 NOT DETECTED   Final   Influenza A H3 NOT DETECTED   Final   Comment: (NOTE)           Normal Reference Range for each Analyte: NOT DETECTED     Testing performed using the Lumine[employer] xTAG Respiratory Viral Panel test     kit.     This test was developed and its performance characteristics determined     by [facility]. It has not been cleared or approved by the [place]     Food and Drug Administration. This test is used for clinical purposes.     It should not be regarded as investigational or for research. This     laboratory is certified under the [place] (CLIA) as qualified to perform high complexity     clinical laboratory testing.     Performed at [facility] DIFFICILE BY PCR     Status: None   Collection Time    12/18/13  2:17 PM      Result Value Ref Range Status   C difficile by pcr NEGATIVE  NEGATIVE Final   Comment: Performed at [facility]     Scheduled Meds: . albuterol  2.5 mg Nebulization QID  . aspirin EC  81 mg Oral Daily  . enoxaparin (LOVENOX) injection  40 mg Subcutaneous Q24H  . pantoprazole  40 mg Oral Daily  . piperacillin-tazobactam (ZOSYN)  IV  3.375 g Intravenous Q8H  . potassium chloride  40 mEq Oral Daily  . simvastatin  20 mg Oral q1800  . sodium chloride  3 mL Intravenous Q12H   Continuous Infusions: . sodium chloride Stopped (12/12/13 1640)  . sodium chloride 20 mL/hr at 12/16/13 2310     Time spent: 25 min    [place] Karlyne Greenspan, MD  Pager [phone]  If 7PM-7AM, please contact night-coverage [email or web address] Password [email or web address] 12/19/2013, 9:05 AM   LOS: 7 days

## 2013-12-19 NOTE — Progress Notes (Signed)
PT and wife have been notified by RT that nebulizer treatments have been changed to Duoned TID.

## 2013-12-19 NOTE — Progress Notes (Signed)
Physical Therapy Treatment Patient Details Name: JDYN PARKERSON MRN: [id number] DOB: 1924/09/06 Today's Date: 12/19/2013    History of Present Illness 80 yo presents with coughinh, SOB, DOE, and xrAY + for Bil pneumonia.     PT Comments    Pt in bed on 4 lts O2 sats avg 96%.  Assisted OOB to amb in hallway on 4 lts sats decreased to 92% with Mod unproductive cough and mod c/o fatigue requiring on sitting rest break.  Assisted pt to BR then to recliner.  Pt is hopeful to D/C to home tomorrow.    Follow Up Recommendations  Home health PT;Supervision/Assistance - 24 hour     Equipment Recommendations  Rolling walker with 5" wheels    Recommendations for Other Services       Precautions / Restrictions Precautions Precautions: Fall Precaution Comments: monitor O2 Restrictions Weight Bearing Restrictions: No    Mobility  Bed Mobility Overal bed mobility: Needs Assistance Bed Mobility: Supine to Sit     Supine to sit: Supervision;Min guard     General bed mobility comments: increased time  Transfers Overall transfer level: Needs assistance Equipment used: Rolling walker (2 wheeled) Transfers: Sit to/from Stand Sit to Stand: Supervision;Min guard         General transfer comment: cues for placement and control of descent  Ambulation/Gait Ambulation/Gait assistance: Supervision;Min guard Ambulation Distance (Feet): 150 Feet Assistive device: Rolling walker (2 wheeled) Gait Pattern/deviations: Step-to pattern;Trunk flexed Gait velocity: WFL   General Gait Details: cues for RW safety esp with turns.  Amb on 4 lts O2 sats avg 92%.  Mod cough with no production.   Stairs            Wheelchair Mobility    Modified Rankin (Stroke Patients Only)       Balance                                    Cognition                            Exercises      General Comments        Pertinent Vitals/Pain     Home Living                       Prior Function            PT Goals (current goals can now be found in the care plan section) Progress towards PT goals: Progressing toward goals    Frequency  Min 3X/week    PT Plan      Co-evaluation             End of Session Equipment Utilized During Treatment: Gait belt;Oxygen Activity Tolerance: Patient tolerated treatment well Patient left: in chair;with call bell/phone within reach;with restraints reapplied     Time: 1240-1305 PT Time Calculation (min): 25 min  Charges:  $Gait Training: 8-22 mins $Therapeutic Activity: 8-22 mins                    G Codes:      Rica Koyanagi  PTA [facility]  Acute  Rehab Pager      [phone]

## 2013-12-20 DIAGNOSIS — I5033 Acute on chronic diastolic (congestive) heart failure: Secondary | ICD-10-CM | POA: Diagnosis present

## 2013-12-20 LAB — BASIC METABOLIC PANEL
BUN: 16 mg/dL (ref 6–23)
CHLORIDE: 89 meq/L — AB (ref 96–112)
CO2: 36 meq/L — AB (ref 19–32)
Calcium: 8.7 mg/dL (ref 8.4–10.5)
Creatinine, Ser: 1.3 mg/dL (ref 0.50–1.35)
GFR calc Af Amer: 56 mL/min — ABNORMAL LOW (ref >90)
GFR calc non Af Amer: 48 mL/min — ABNORMAL LOW (ref >90)
GLUCOSE: 94 mg/dL (ref 70–99)
Potassium: 4.8 mEq/L (ref 3.7–5.3)
Sodium: 132 mEq/L — ABNORMAL LOW (ref 137–147)

## 2013-12-20 MED ORDER — ALBUTEROL SULFATE HFA 108 (90 BASE) MCG/ACT IN AERS: 2.0000 | INHALATION_SPRAY | Freq: Four times a day (QID) | RESPIRATORY_TRACT | Status: DC | PRN

## 2013-12-20 MED ORDER — AMOXICILLIN-POT CLAVULANATE 875-125 MG PO TABS: 1.0000 | ORAL_TABLET | Freq: Two times a day (BID) | ORAL | Status: DC

## 2013-12-20 NOTE — Discharge Instructions (Signed)
You were cared for by a hospitalist during your hospital stay. If you have any questions about your discharge medications or the care you received while you were in the hospital after you are discharged, you can call the unit and asked to speak with the hospitalist on call if the hospitalist that took care of you is not available. Once you are discharged, your primary care physician will handle any further medical issues. Please note that NO REFILLS for any discharge medications will be authorized once you are discharged, as it is imperative that you return to your primary care physician (or establish a relationship with a primary care physician if you do not have one) for your aftercare needs so that they can reassess your need for medications and monitor your lab values.     If you do not have a primary care physician, you can call [phone] for a physician referral.  Follow with Primary MD Chase Stain, MD in 5-7 days   Get CBC, CMP checked by your doctor and again as further instructed.  Get a 2 view Chest X ray done next visit if you had Pneumonia of Lung problems at the [facility] reviewed and adjusted.  Please request your Prim.MD to go over all Hospital Tests and Procedure/Radiological results at the follow up, please get all Hospital records sent to your Prim MD by signing hospital release before you go home.  Activity: As tolerated with Full fall precautions use walker/cane & assistance as needed  Diet:  Diet recommendations: Dysphagia 2 (fine chop);Thin liquid  Medication Administration: (as tolerated)  Supervision: Patient able to self feed;Intermittent supervision to cue for compensatory strategies  Compensations: Slow rate;Small sips/bites;Follow solids with liquid (start meal with liquids, consume several small meals)  Postural Changes and/or Swallow Maneuvers: Seated upright 90 degrees;Upright 30-60 min after meal    For Heart failure patients - Check your Weight  same time everyday, if you gain over 2 pounds, or you develop in leg swelling, experience more shortness of breath or chest pain, call your Primary MD immediately. Follow Cardiac Low Salt Diet and 1.8 lit/day fluid restriction.  Disposition Home  If you experience worsening of your admission symptoms, develop shortness of breath, life threatening emergency, suicidal or homicidal thoughts you must seek medical attention immediately by calling 911 or calling your MD immediately  if symptoms less severe.  You Must read complete instructions/literature along with all the possible adverse reactions/side effects for all the Medicines you take and that have been prescribed to you. Take any new Medicines after you have completely understood and accpet all the possible adverse reactions/side effects.   Do not drive and provide baby sitting services if your were admitted for syncope or siezures until you have seen by Primary MD or a Neurologist and advised to do so again.  Do not drive when taking Pain medications.   Do not take more than prescribed Pain, Sleep and Anxiety Medications  Special Instructions: If you have smoked or chewed Tobacco  in the last 2 yrs please stop smoking, stop any regular Alcohol  and or any Recreational drug use.  Wear Seat belts while driving.

## 2013-12-20 NOTE — Progress Notes (Signed)
Patients oxygen saturation on room air at rest was 87%. Patient was placed on 2L of oxygen to obtain an oxygen saturation of 95%. Patient ambulated >100 feet in hallway and oxygen saturation stayed at 92-93% on 2L nasal cannula. Patient resting in chair at the moment with an oxygen saturation of 93% on 2L. Will notify MD and continue to monitor patient. Pearline Cables Setzer

## 2013-12-20 NOTE — Discharge Summary (Signed)
Physician Discharge Summary  [name] GQB:[id number] DOB: Dec 08, 1924 DOA: 12/12/2013  PCP: [name], MD  Admit date: 12/12/2013 Discharge date: 12/20/2013  Time spent: 35 minutes  Recommendations for Outpatient Follow-up:  1. Follow up with PCP in 1-2 weeks 2. Follow up with GI as scheduled for possible endoscopy   Recommendations for primary care physician for things to follow:  Repeat BMP, monitor clinical improvement, consider repeat CXR  Discharge Diagnoses:  Principal Problem:   CAP (community acquired pneumonia) Active Problems:   HYPERTENSION   PNEUMONIA, BILATERAL   Sepsis   Dysphagia, unspecified(787.20)   Acute on chronic diastolic heart failure  Discharge Condition: stable  Diet recommendation: dysphagia 2 (fine chop)  Filed Weights   12/18/13 0526 12/19/13 0540 12/20/13 0456  Weight: 62.4 kg (137 lb 9.1 oz) 60.2 kg (132 lb 11.5 oz) 62.687 kg (138 lb 3.2 oz)   History of present illness:  [name] is a 80 y.o. male with PMH of HTN, HPL,esophageral stricture s/p dilatation (2013), h/o Asthma, tobacco use presented with SOB, DOE, associated with productive cough, fever, chills for several days, reports his wife is having similar symptoms; denies chest pain, no dizziness, had mild nausea, vomited x 1, no diarrhea -ED x ray showed BL pneumonia   Hospital Course:  Acute hypoxic respiratory failure with hypoxia this was likely due to PNA, probably in the setting of aspiration and less likely due to diastolic CHF. Patient was started on vancomycin and Zosyn and has received 7 days of IV antibiotics, then he was transitioned to Augmentin and remained stable and is to continue Augmentin for 7 additional days as an outpatient. His aspiration is related to his known esophageal issues. GI has been consulted while patient was hospitalized and he was supposed to have an EGD but given his hypoxia and overall lung status this was postponed until after 1-2 weeks  to allow time for recovery. His influenza and respiratory virus panel were negative, negative legionella and strep pneumo negative. Pulmonary was consulted and followed patient while hospitalized.  Pulmonary vascular congestion - noted on CXR and pt with crackles on exam, BNP > 2000 --> 1600 (4/21), 2 D ECHO with normal systolic function and grade I diastolic dysfunction (full read below). Patient was diuresed for 3 days with improvement in his weight and fluid status however mild AKI which improved with discontinuation on Lasix.  HYPERTENSION - stable, reasonable inpatient control  Sepsis (leukocytosis, tachycardia, hypotension, fever) secondary to PNA, WBC trended down. Dysphagia - patient with known esophageal stricture s/p EGD with dilation in 2013 with improvement in his dysphagia then presented with recurring symptoms and aspiration. Patient sis really well with dysphagia diet and SLP evaluated him while hospitalized. I brought up couple of times that if EGD will not be revealing or is there is further aspiration events in the future to think about alternative feeding methods such as PEG tube, however patient is very adamant against it.  Procedures:  2D echo Study Conclusions - Left ventricle: The cavity size was normal. There was mild focal basal hypertrophy of the septum. Systolic function was vigorous. The estimated ejection fraction was in the range of 65% to 70%. Wall motion was normal; there were no regional wall motion abnormalities. Doppler parameters are consistent with abnormal left ventricular relaxation (grade 1 diastolic dysfunction).   Consultations:  PCCM  GI  Discharge Exam: Filed Vitals:   12/19/13 2009 12/19/13 2036 12/20/13 0456 12/20/13 0815  BP:  114/61 151/68  Pulse:  89 68   Temp:  98 F (36.7 C) 97.7 F (36.5 C)   TempSrc:  Oral Oral   Resp:  18 19   Height:      Weight:   62.687 kg (138 lb 3.2 oz)   SpO2: 94% 98% 95% 94%    General:  NAD Cardiovascular: RRR Respiratory: no wheezing, minimal rales at the bases  Discharge Instructions   Future Appointments Provider Department Dept Phone   01/06/2014 4:00 PM Irene Shipper, MD [place] [phone]       Medication List         albuterol 108 (90 BASE) MCG/ACT inhaler  Commonly known as:  PROVENTIL HFA;VENTOLIN HFA  Inhale 2 puffs into the lungs every 6 (six) hours as needed for wheezing or shortness of breath.     amLODipine 10 MG tablet  Commonly known as:  NORVASC  Take 1 tablet (10 mg total) by mouth daily.     amoxicillin-clavulanate 875-125 MG per tablet  Commonly known as:  AUGMENTIN  Take 1 tablet by mouth every 12 (twelve) hours.     aspirin EC 81 MG tablet  Take 81 mg by mouth daily.     fluorouracil 5 % cream  Commonly known as:  EFUDEX     hydrochlorothiazide 25 MG tablet  Commonly known as:  HYDRODIURIL  Take 1 tablet (25 mg total) by mouth daily.     lisinopril 20 MG tablet  Commonly known as:  PRINIVIL,ZESTRIL  Take 1 tablet (20 mg total) by mouth daily.     PRILOSEC 20 MG capsule  Generic drug:  omeprazole  Take 20 mg by mouth daily.     simvastatin 20 MG tablet  Commonly known as:  ZOCOR  Take 1/2 tab by mouth at night           Follow-up Information   Follow up with [name], MD. Schedule an appointment as soon as possible for a visit in 1 week.   Specialty:  Family Medicine   Contact information:   [place] [address] [phone]       Follow up with [name], MD. Schedule an appointment as soon as possible for a visit in 1 week.   Specialty:  Gastroenterology   Contact information:   [address] [place] [address] [phone]       The results of significant diagnostics from this hospitalization (including imaging, microbiology, ancillary and laboratory) are listed below for reference.    Significant Diagnostic Studies: Dg Chest 2  View  12/12/2013   CLINICAL DATA:  Shortness of breath and cough for 2 days with history of hypertension  EXAM: CHEST  2 VIEW  COMPARISON:  DG PNEUMONIA CHEST PORT1V dated 03/15/2011  FINDINGS: The lungs are adequately inflated. The interstitial markings are increased in the mid and lower lung zones bilaterally. This suggests interstitial pneumonia. The cardiopericardial silhouette is top-normal in size. The pulmonary vascularity is not clearly engorged. There is no pleural effusion. The observed portions of the bony thorax exhibit no acute abnormalities.  IMPRESSION: The findings are consistent with bilateral interstitial pneumonia.   Electronically Signed   By: David  Martinique   On: 12/12/2013 14:44   Dg Chest Port 1 View  12/17/2013   CLINICAL DATA:  Pneumonia.  Cough.  EXAM: PORTABLE CHEST - 1 VIEW  COMPARISON:  Plain film of the chest 12/14/2013.  FINDINGS: Right basilar airspace disease persists but has  improved. Patchy airspace opacity in left lung base is also improved. There is no pneumothorax or pleural effusion. Heart size is normal.  IMPRESSION: Improved patchy bibasilar airspace disease, worse on the right.   Electronically Signed   By: Inge Rise M.D.   On: 12/17/2013 17:16   Dg Chest Port 1 View  12/14/2013   CLINICAL DATA:  Short of breath, pneumonia versus pulmonary edema  EXAM: PORTABLE CHEST - 1 VIEW  COMPARISON:  Prior chest x-ray 12/12/2013  FINDINGS: Cardiac and mediastinal contours are unchanged. Atherosclerotic calcifications are noted in the transverse aorta. There has been a slight interval increase in diffuse bilateral interstitial prominence. Increasing airspace opacity in the medial aspect of the right lower lobe. No pneumothorax or pleural effusion. No acute osseous abnormality.  IMPRESSION: 1. Increased patchy airspace opacity in the medial right lower lobe concerning for pneumonia. Asymmetric pulmonary edema is a less likely consideration. 2. Slightly increased bilateral  interstitial opacities suspected to represent superimposed interstitial edema. Atypical infection/ inflammation is also possible.   Electronically Signed   By: Jacqulynn Cadet M.D.   On: 12/14/2013 08:37    Microbiology: Recent Results (from the past 240 hour(s))  CULTURE, BLOOD (ROUTINE X 2)     Status: None   Collection Time    12/12/13  5:21 PM      Result Value Ref Range Status   Specimen Description BLOOD RIGHT ANTECUBITAL   Final   Special Requests BOTTLES DRAWN AEROBIC AND ANAEROBIC [facility]   Final   Culture  Setup Time     Final   Value: 12/12/2013 20:03     Performed at [facility]   Culture     Final   Value: NO GROWTH 5 DAYS     Performed at [facility]   Report Status 12/18/2013 FINAL   Final  CULTURE, BLOOD (ROUTINE X 2)     Status: None   Collection Time    12/12/13  5:28 PM      Result Value Ref Range Status   Specimen Description BLOOD LEFT HAND   Final   Special Requests BOTTLES DRAWN AEROBIC AND ANAEROBIC 5CC   Final   Culture  Setup Time     Final   Value: 12/12/2013 20:04     Performed at [facility]   Culture     Final   Value: NO GROWTH 5 DAYS     Performed at [facility]   Report Status 12/18/2013 FINAL   Final  CULTURE, EXPECTORATED SPUTUM-ASSESSMENT     Status: None   Collection Time    12/13/13  9:31 AM      Result Value Ref Range Status   Specimen Description SPUTUM   Final   Special Requests Normal   Final   Sputum evaluation     Final   Value: MICROSCOPIC FINDINGS SUGGEST THAT THIS SPECIMEN IS NOT REPRESENTATIVE OF LOWER RESPIRATORY SECRETIONS. PLEASE RECOLLECT.     Ledell Noss RN AT 3016 ON 04.17.15 BY SHUEA   Report Status 12/13/2013 FINAL   Final  RESPIRATORY VIRUS PANEL     Status: None   Collection Time    12/17/13  4:00 PM      Result Value Ref Range Status   Source - [facility] NASAL WASHINGS   Corrected   Comment: CORRECTED ON 04/22 AT 1814: PREVIOUSLY REPORTED AS NASAL WASHINGS   Respiratory  Syncytial Virus A NOT DETECTED   Final   Respiratory Syncytial Virus B  NOT DETECTED   Final   Influenza A NOT DETECTED   Final   Influenza B NOT DETECTED   Final   Parainfluenza 1 NOT DETECTED   Final   Parainfluenza 2 NOT DETECTED   Final   Parainfluenza 3 NOT DETECTED   Final   Metapneumovirus NOT DETECTED   Final   Rhinovirus NOT DETECTED   Final   Adenovirus NOT DETECTED   Final   Influenza A H1 NOT DETECTED   Final   Influenza A H3 NOT DETECTED   Final   Comment: (NOTE)           Normal Reference Range for each Analyte: NOT DETECTED     Testing performed using the Luminex xTAG Respiratory Viral Panel test     kit.     This test was developed and its performance characteristics determined     by [facility]. It has not been cleared or approved by the [place]     Food and Drug Administration. This test is used for clinical purposes.     It should not be regarded as investigational or for research. This     laboratory is certified under the [place] (CLIA) as qualified to perform high complexity     clinical laboratory testing.     Performed at [facility] DIFFICILE BY PCR     Status: None   Collection Time    12/18/13  2:17 PM      Result Value Ref Range Status   C difficile by pcr NEGATIVE  NEGATIVE Final   Comment: Performed at [facility]: Basic Metabolic Panel:  Recent Labs Lab 12/15/13 0543 12/16/13 0426 12/17/13 0422 12/18/13 0441 12/19/13 0520 12/20/13 0449  NA 136* 136* 136* 135*  --  132*  K 3.5* 3.5* 3.5* 4.1  --  4.8  CL 89* 91* 89* 90*  --  89*  CO2 35* 38* 37* 38*  --  36*  GLUCOSE 90 98 105* 92  --  94  BUN _0 --  16  CREATININE 1.11 1.03 1.14 1.28 1.43* 1.30  CALCIUM 8.8 8.6 8.5 8.7  --  8.7   CBC:  Recent Labs Lab 12/14/13 0412 12/16/13 0426 12/17/13 0422 12/18/13 0441  WBC 15.2* 12.4* 10.7* 11.4*  HGB 11.0* 10.8* 10.4* 10.6*  HCT 33.5*  33.3* 31.8* 33.2*  MCV 82.1 82.6 81.7 83.8  PLT 371 418* 417* 433*   BNP: BNP (last 3 results)  Recent Labs  12/14/13 0412 12/16/13 0426 12/17/13 0422  PROBNP 2914.0* 1821.0* 1657.0*    Signed:  Jacarra Bobak M Messiah Ahr  [facility] 12/20/2013, 2:21 PM

## 2013-12-24 ENCOUNTER — Telehealth: Payer: Self-pay

## 2013-12-24 NOTE — Telephone Encounter (Signed)
Ann PT with [facility] left v/m requesting verbal home health PT orders fro 2 x a week for 2 weeks and 1 x a week for 2 weeks.Please advise.

## 2013-12-25 NOTE — Telephone Encounter (Signed)
Please give the order.  Thanks.   

## 2013-12-25 NOTE — Telephone Encounter (Signed)
Left message on voice mail  to call back

## 2013-12-26 NOTE — Telephone Encounter (Signed)
Ann advised.

## 2013-12-27 ENCOUNTER — Encounter: Payer: Self-pay | Admitting: Family Medicine

## 2013-12-27 ENCOUNTER — Ambulatory Visit (INDEPENDENT_AMBULATORY_CARE_PROVIDER_SITE_OTHER): Payer: Medicare Other | Admitting: Family Medicine

## 2013-12-27 VITALS — BP 138/60 | HR 85 | Temp 98.0°F | Wt 142.0 lb

## 2013-12-27 DIAGNOSIS — J189 Pneumonia, unspecified organism: Secondary | ICD-10-CM

## 2013-12-27 DIAGNOSIS — I10 Essential (primary) hypertension: Secondary | ICD-10-CM

## 2013-12-27 DIAGNOSIS — I5033 Acute on chronic diastolic (congestive) heart failure: Secondary | ICD-10-CM

## 2013-12-27 DIAGNOSIS — R1310 Dysphagia, unspecified: Secondary | ICD-10-CM

## 2013-12-27 LAB — BASIC METABOLIC PANEL
BUN: 10 mg/dL (ref 6–23)
CALCIUM: 9 mg/dL (ref 8.4–10.5)
CO2: 33 mEq/L — ABNORMAL HIGH (ref 19–32)
CREATININE: 1.2 mg/dL (ref 0.4–1.5)
Chloride: 94 mEq/L — ABNORMAL LOW (ref 96–112)
GFR: 62.07 mL/min (ref >60.00)
Glucose, Bld: 78 mg/dL (ref 70–99)
Potassium: 4.2 mEq/L (ref 3.5–5.1)
Sodium: 137 mEq/L (ref 135–145)

## 2013-12-27 NOTE — Assessment & Plan Note (Signed)
Much improved, ctab.  Consider CXR in 01/2014 (after he has had f/u with GI in the meantime).   Done with abx as of today.

## 2013-12-27 NOTE — Patient Instructions (Signed)
Keep going with your diet for now.  The cough should gradually get better.   Go to the lab on the way out.  We'll contact you with your lab report. Keep the appointment with the GI clinic on the 11th.  Take care.

## 2013-12-27 NOTE — Assessment & Plan Note (Signed)
With GI f/u pending.

## 2013-12-27 NOTE — Progress Notes (Signed)
Pre visit review using our clinic review tool, if applicable. No additional management support is needed unless otherwise documented below in the visit note.  Admitted with PNA and elevated BNP.  Treated appropriately with abx and diuresis.  Now with some cough, but much improved.  Still with prn and qhs O2.  No sig sputum. No fevers.  Overall feels stronger but not back to baseline. Due for f/u BMET today.  Hospital records reviewed.    D/w pt about GI f/u.  Possible ASP PNA. Still on soft diet, easy to swallow. No choking.   PMH and SH reviewed  ROS: See HPI, otherwise noncontributory.  Meds, vitals, and allergies reviewed.   nad ncat Mmm Neck supple, no LA rrr Ctab, no wheeze no inc in wob abd soft  Ext w/o edema

## 2013-12-27 NOTE — Assessment & Plan Note (Signed)
Appears euvolemic now, continue as is, recheck BMET today.

## 2013-12-30 ENCOUNTER — Telehealth: Payer: Self-pay | Admitting: Family Medicine

## 2013-12-30 NOTE — Telephone Encounter (Signed)
Relevant patient education mailed to patient.  

## 2014-01-05 DIAGNOSIS — I5032 Chronic diastolic (congestive) heart failure: Secondary | ICD-10-CM

## 2014-01-05 DIAGNOSIS — J189 Pneumonia, unspecified organism: Secondary | ICD-10-CM

## 2014-01-05 DIAGNOSIS — IMO0001 Reserved for inherently not codable concepts without codable children: Secondary | ICD-10-CM

## 2014-01-05 DIAGNOSIS — A419 Sepsis, unspecified organism: Secondary | ICD-10-CM

## 2014-01-06 ENCOUNTER — Ambulatory Visit: Payer: Medicare Other | Admitting: Internal Medicine

## 2014-01-06 NOTE — Patient Instructions (Signed)
Dexilant 60 mg 1 pill by mouth every day, samples given to you.   Please follow up with your primary care physician, Dr.Duncan.   Call us with any questions or concerns. Thank you. Hope you feel better soon.

## 2014-01-09 ENCOUNTER — Ambulatory Visit (INDEPENDENT_AMBULATORY_CARE_PROVIDER_SITE_OTHER)
Admission: RE | Admit: 2014-01-09 | Discharge: 2014-01-09 | Disposition: A | Discharge status: Home / Self Care | Payer: Medicare Other | Source: Ambulatory Visit | Attending: Family Medicine | Admitting: Family Medicine

## 2014-01-09 ENCOUNTER — Ambulatory Visit (INDEPENDENT_AMBULATORY_CARE_PROVIDER_SITE_OTHER): Payer: Medicare Other | Admitting: Family Medicine

## 2014-01-09 ENCOUNTER — Encounter: Payer: Self-pay | Admitting: Family Medicine

## 2014-01-09 VITALS — BP 144/68 | HR 76 | Temp 97.8°F | Wt 138.8 lb

## 2014-01-09 DIAGNOSIS — R0602 Shortness of breath: Secondary | ICD-10-CM

## 2014-01-09 DIAGNOSIS — J189 Pneumonia, unspecified organism: Secondary | ICD-10-CM

## 2014-01-09 LAB — CBC WITH DIFFERENTIAL/PLATELET
BASOS PCT: 0.5 % (ref 0.0–3.0)
Basophils Absolute: 0 10*3/uL (ref 0.0–0.1)
EOS PCT: 5.4 % — AB (ref 0.0–5.0)
Eosinophils Absolute: 0.4 10*3/uL (ref 0.0–0.7)
HCT: 38 % — ABNORMAL LOW (ref 39.0–52.0)
Hemoglobin: 12.5 g/dL — ABNORMAL LOW (ref 13.0–17.0)
LYMPHS PCT: 31.8 % (ref 12.0–46.0)
Lymphs Abs: 2.6 10*3/uL (ref 0.7–4.0)
MCHC: 32.8 g/dL (ref 30.0–36.0)
MCV: 82.7 fl (ref 78.0–100.0)
MONO ABS: 0.7 10*3/uL (ref 0.1–1.0)
Monocytes Relative: 8.5 % (ref 3.0–12.0)
Neutro Abs: 4.3 10*3/uL (ref 1.4–7.7)
Neutrophils Relative %: 53.8 % (ref 43.0–77.0)
PLATELETS: 320 10*3/uL (ref 150.0–400.0)
RBC: 4.59 Mil/uL (ref 4.22–5.81)
RDW: 15.6 % — ABNORMAL HIGH (ref 11.5–15.5)
WBC: 8.1 10*3/uL (ref 4.0–10.5)

## 2014-01-09 LAB — BASIC METABOLIC PANEL
BUN: 17 mg/dL (ref 6–23)
CHLORIDE: 98 meq/L (ref 96–112)
CO2: 31 mEq/L (ref 19–32)
Calcium: 8.8 mg/dL (ref 8.4–10.5)
Creatinine, Ser: 1.3 mg/dL (ref 0.4–1.5)
GFR: 55.5 mL/min — AB (ref >60.00)
GLUCOSE: 76 mg/dL (ref 70–99)
Potassium: 4.4 mEq/L (ref 3.5–5.1)
SODIUM: 136 meq/L (ref 135–145)

## 2014-01-09 LAB — BRAIN NATRIURETIC PEPTIDE: PRO B NATRI PEPTIDE: 131 pg/mL — AB (ref 0.0–100.0)

## 2014-01-09 MED ORDER — AMOXICILLIN-POT CLAVULANATE 875-125 MG PO TABS: 1.0000 | ORAL_TABLET | Freq: Two times a day (BID) | ORAL | Status: DC

## 2014-01-09 NOTE — Patient Instructions (Signed)
Go to the lab on the way out.  We'll contact you with your lab and xray report. We may have to adjust your meds in the meantime. Take care.

## 2014-01-09 NOTE — Progress Notes (Signed)
Pre visit review using our clinic review tool, if applicable. No additional management support is needed unless otherwise documented below in the visit note.  He feels much better than during the hospitalization for PNA but not fully well.  His prev GI procedure had to be pushed back.  No fevers.  Still with some cough.  No sputum.  Not on O2 anymore.  His strength is not back to normal.  Eating fairly well, if small bite size.  Still with some wheeze, worse supine.  Sleeping on 1 pillow.  Not much edema in the ankles now, prev had some, but improved now.    Meds, vitals, and allergies reviewed.   ROS: See HPI.  Otherwise, noncontributory.  nad Elderly male Ncat Neck supple, no LA Mmm rrr Coarse BS B but no focal dec in BS abd soft No edema

## 2014-01-10 ENCOUNTER — Telehealth: Payer: Self-pay | Admitting: *Deleted

## 2014-01-10 NOTE — Telephone Encounter (Signed)
Make this patient a routine office visit with me in about 4 weeks. Please notify the patient and his wife regarding the appointment. Thanks ----- Message ----- From: Tonia Ghent, MD Sent: 01/10/2014 6:39 AM To: Irene Shipper, MD Spoke with patient and he will be out of town the last few weeks of June. Scheduled OV on 03/05/14 at 8:30 AM.

## 2014-01-10 NOTE — Assessment & Plan Note (Signed)
Improved, but not resolved. Will extend augmentin at this point. See notes on labs.  Doesn't look to be fluid overload. Continue as is o/w.  I would expect gradual improvement. Will get update next week and we'll go from there.  Routed to GI as a FYI.  Okay for outpatient f/u.  No inc in WOB today. >25 minutes spent in face to face time with patient, >50% spent in counselling or coordination of care.

## 2014-01-13 ENCOUNTER — Telehealth: Payer: Self-pay | Admitting: Family Medicine

## 2014-01-13 NOTE — Telephone Encounter (Signed)
Please get an update on patient.  Thanks. 

## 2014-01-13 NOTE — Telephone Encounter (Signed)
Appointment scheduled.

## 2014-01-13 NOTE — Telephone Encounter (Signed)
Per your report, patient was improved.  Would need f/u CXR and OV after the abx are done.  We can do the CXR at the [facility].  Thanks.

## 2014-01-17 ENCOUNTER — Ambulatory Visit (INDEPENDENT_AMBULATORY_CARE_PROVIDER_SITE_OTHER): Payer: Medicare Other | Admitting: Family Medicine

## 2014-01-17 ENCOUNTER — Encounter: Payer: Self-pay | Admitting: Family Medicine

## 2014-01-17 ENCOUNTER — Ambulatory Visit (INDEPENDENT_AMBULATORY_CARE_PROVIDER_SITE_OTHER)
Admission: RE | Admit: 2014-01-17 | Discharge: 2014-01-17 | Disposition: A | Discharge status: Home / Self Care | Payer: Medicare Other | Source: Ambulatory Visit | Attending: Family Medicine | Admitting: Family Medicine

## 2014-01-17 VITALS — BP 158/72 | HR 70 | Temp 97.9°F | Wt 139.0 lb

## 2014-01-17 DIAGNOSIS — J189 Pneumonia, unspecified organism: Secondary | ICD-10-CM

## 2014-01-17 NOTE — Assessment & Plan Note (Signed)
Much improved clinically, CXR improving.  Would use SABA prn in the meantime.  If any regression or inc in SABA use then he'll notify me. He agrees.

## 2014-01-17 NOTE — Patient Instructions (Signed)
Take your regular meds and use the inhaler as needed.  If you need the inhaler, then let me know.  Take care.

## 2014-01-17 NOTE — Progress Notes (Signed)
Pre visit review using our clinic review tool, if applicable. No additional management support is needed unless otherwise documented below in the visit note.  PNA f/u.  Breathing is better. Some wheeze, less than prev.  No fevers.  Last abx taken this AM.  No vomiting, no diarrhea.  Cough is improved. Rare sputum.  Appetite is better than prev.  Not lightheaded.  Using SABA prn, less than prev, about once a day.    Meds, vitals, and allergies reviewed.   ROS: See HPI.  Otherwise, noncontributory.  Nad, globally appears to feel better today ncat Mmm rrr Slightly chest congestion (scant ext rhonchi ) cleared with a cough- ctab after cough.   No inc in wob.  No wheeze abd soft Ext w/o edema

## 2014-01-24 ENCOUNTER — Encounter: Payer: Self-pay | Admitting: *Deleted

## 2014-01-24 ENCOUNTER — Telehealth: Payer: Self-pay

## 2014-01-24 NOTE — Telephone Encounter (Signed)
Please send the d/c order and notify her.  Thanks.

## 2014-01-24 NOTE — Telephone Encounter (Signed)
Mrs Caetano wants pts oxygen & equipment picked up by [facility] [phone]. Pt does not need oxygen equipment any longer; Mrs President cannot remember the last time pt needed O2. Mrs Rezabek request cb when [facility] notified.

## 2014-01-24 NOTE — Telephone Encounter (Signed)
Order written and faxed to [phone]

## 2014-02-18 ENCOUNTER — Ambulatory Visit: Payer: Medicare Other | Admitting: Internal Medicine

## 2014-03-03 ENCOUNTER — Other Ambulatory Visit: Payer: Self-pay

## 2014-03-03 NOTE — Telephone Encounter (Signed)
Pt request refill simvastatin 20mg  taking one tab daily.  Spoke with pts wife and she verified with pt that pt is taking simvastatin 20 mg taking one tab daily. On Med list have simvastatin 20 mg taking 1/2 tab daily. Request 90 day rx with refills to [facility] and # 15 to [facility] until can get mail order delivery. Pt has been without med for 5 days.Please advise. I have not changed med list instructions until Dr Damita Dunnings OKs. Call pt back when refilled.

## 2014-03-04 MED ORDER — SIMVASTATIN 20 MG PO TABS: ORAL_TABLET | ORAL | Status: DC

## 2014-03-04 NOTE — Telephone Encounter (Signed)
Sent. Thanks.   

## 2014-03-05 ENCOUNTER — Ambulatory Visit: Payer: Medicare Other | Admitting: Internal Medicine

## 2014-03-14 ENCOUNTER — Telehealth: Payer: Self-pay | Admitting: Family Medicine

## 2014-03-14 NOTE — Telephone Encounter (Signed)
Pt wife stopped by office asking if Preser vision-Eye vitamin and mineral supplement AREDS 2 formula would interfere with any of pt's current medications? Pt's eye doctor recommended this for his macular degeneration. Please advise

## 2014-03-16 MED ORDER — PRESERVISION AREDS 2 PO CAPS: ORAL_CAPSULE | ORAL | Status: DC

## 2014-03-16 NOTE — Telephone Encounter (Signed)
Fine to take.  Thanks.  Added to list.

## 2014-03-17 NOTE — Telephone Encounter (Signed)
Left detailed message on voicemail.  

## 2014-05-12 ENCOUNTER — Encounter: Payer: Self-pay | Admitting: Internal Medicine

## 2014-05-12 ENCOUNTER — Ambulatory Visit (INDEPENDENT_AMBULATORY_CARE_PROVIDER_SITE_OTHER): Payer: Medicare Other | Admitting: Internal Medicine

## 2014-05-12 VITALS — BP 148/66 | HR 65 | Temp 98.1°F | Wt 146.5 lb

## 2014-05-12 DIAGNOSIS — H6120 Impacted cerumen, unspecified ear: Secondary | ICD-10-CM

## 2014-05-12 DIAGNOSIS — H6123 Impacted cerumen, bilateral: Secondary | ICD-10-CM

## 2014-05-12 DIAGNOSIS — H918X9 Other specified hearing loss, unspecified ear: Secondary | ICD-10-CM

## 2014-05-12 NOTE — Progress Notes (Signed)
Pre visit review using our clinic review tool, if applicable. No additional management support is needed unless otherwise documented below in the visit note. 

## 2014-05-12 NOTE — Patient Instructions (Signed)
Cerumen Impaction °A cerumen impaction is when the wax in your ear forms a plug. This plug usually causes reduced hearing. Sometimes it also causes an earache or dizziness. Removing a cerumen impaction can be difficult and painful. The wax sticks to the ear canal. The canal is sensitive and bleeds easily. If you try to remove a heavy wax buildup with a cotton tipped swab, you may push it in further. °Irrigation with water, suction, and small ear curettes may be used to clear out the wax. If the impaction is fixed to the skin in the ear canal, ear drops may be needed for a few days to loosen the wax. People who build up a lot of wax frequently can use ear wax removal products available in your local drugstore. °SEEK MEDICAL CARE IF:  °You develop an earache, increased hearing loss, or marked dizziness. °Document Released: 09/22/2004 Document Revised: 11/07/2011 Document Reviewed: 11/12/2009 °ExitCare® Patient Information ©2015 [employer]. This information is not intended to replace advice given to you by your health care provider. Make sure you discuss any questions you have with your health care provider. ° °

## 2014-05-12 NOTE — Progress Notes (Signed)
Subjective:    Patient ID: [name], male    DOB: 1925-01-21, 80 y.o.   MRN: [id number]  HPI  Pt presents to the clinic today with c/o difficulty hearing. He reports this started 2-3 days ago. He denies pain in his ears. He does hear a constant humming noise. He has had this happen one other time when he had wax stuck in his ears.  Review of Systems      Past Medical History  Diagnosis Date  . Hypertension   . Hyperlipidemia   . Tobacco abuse   . Lung nodule     Left lung, seen 2012, no change in 2012, no follow up needed   . PNA (pneumonia) 2010    Bilateral Pneumonia, COPD 23mm LLL Nodule   . History of ETT 09/1991     POS   . History of MRI 04-22-06    L/S- right hnp  l5/si   . B12 deficiency   . TIA (transient ischemic attack)   . GERD (gastroesophageal reflux disease)   . Esophageal stricture   . Hemorrhoids   . Colon polyps     hyperplastic    Current Outpatient Prescriptions  Medication Sig Dispense Refill  . albuterol (PROVENTIL HFA;VENTOLIN HFA) 108 (90 BASE) MCG/ACT inhaler Inhale 2 puffs into the lungs every 6 (six) hours as needed for wheezing or shortness of breath.  1 Inhaler  2  . amLODipine (NORVASC) 10 MG tablet Take 1 tablet (10 mg total) by mouth daily.  90 tablet  1  . aspirin EC 81 MG tablet Take 81 mg by mouth daily.      . fluorouracil (EFUDEX) 5 % cream       . hydrochlorothiazide (HYDRODIURIL) 25 MG tablet Take 1 tablet (25 mg total) by mouth daily.  90 tablet  1  . lisinopril (PRINIVIL,ZESTRIL) 20 MG tablet Take 1 tablet (20 mg total) by mouth daily.  90 tablet  3  . Multiple Vitamins-Minerals (PRESERVISION AREDS 2) CAPS Take as directed.      Marland Kitchen omeprazole (PRILOSEC) 20 MG capsule Take 20 mg by mouth daily.      . simvastatin (ZOCOR) 20 MG tablet Take 1 tab by mouth at night  15 tablet  0   No current facility-administered medications for this visit.    No Known Allergies  Family History  Problem Relation Age of Onset  .  Hypertension Mother   . Heart attack Father   . Colon cancer Neg Hx   . Esophageal cancer Neg Hx     History   Social History  . Marital Status: Married    Spouse Name: N/A    Number of Children: 2  . Years of Education: N/A   Occupational History  . 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 Smoking status: Former Smoker    Types: Cigarettes    Quit date: 08/29/1985  . Smokeless tobacco: Former Systems developer    Types: Chew  . Alcohol Use: No  . Drug Use: No  . Sexual Activity: Not on file   Other Topics Concern  . 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  HEENT:  Pt reports difficulty hearing. Denies eye pain, eye redness, ear pain, runny nose, nasal congestion, bloody nose, or sore throat.  Neurological: Denies dizziness, difficulty with memory, difficulty with speech or problems with balance and coordination.   No other specific complaints in a complete review of systems (except as listed in HPI above).  Objective:   Physical Exam   BP 148/66  Pulse 65  Temp(Src) 98.1 F (36.7 C) (Oral)  Wt 146 lb 8 oz (66.452 kg)  SpO2 97% Wt Readings from Last 3 Encounters:  05/12/14 146 lb 8 oz (66.452 kg)  01/17/14 139 lb (63.05 kg)  01/09/14 138 lb 12 oz (62.937 kg)    General: Appears his stated age in NAD HEENT:  Ears: bilateral cerumen impaction.   BMET    Component Value Date/Time   NA 136 01/09/2014 1250   K 4.4 01/09/2014 1250   CL 98 01/09/2014 1250   CO2 31 01/09/2014 1250   GLUCOSE 76 01/09/2014 1250   BUN 17 01/09/2014 1250   CREATININE 1.3 01/09/2014 1250   CALCIUM 8.8 01/09/2014 1250   GFRNONAA 48* 12/20/2013 0449   GFRAA 56* 12/20/2013 0449    Lipid Panel       Component Value Date/Time   CHOL 160 11/08/2013 0840   TRIG 74.0 11/08/2013 0840   HDL 49.30 11/08/2013 0840   CHOLHDL 3 11/08/2013 0840   VLDL 14.8 11/08/2013 0840   LDLCALC 96 11/08/2013 0840    CBC    Component Value Date/Time   WBC 8.1 01/09/2014 1250   RBC 4.59 01/09/2014 1250   HGB 12.5* 01/09/2014 1250   HCT 38.0* 01/09/2014 1250   PLT 320.0 01/09/2014 1250   MCV 82.7 01/09/2014 1250   MCH 26.8 12/18/2013 0441   MCHC 32.8 01/09/2014 1250   RDW 15.6* 01/09/2014 1250   LYMPHSABS 2.6 01/09/2014 1250   MONOABS 0.7 01/09/2014 1250   EOSABS 0.4 01/09/2014 1250   BASOSABS 0.0 01/09/2014 1250    Hgb A1C Lab Results  Component Value Date   HGBA1C 6.2* 03/16/2011        Assessment & Plan:   Bilateral Cerumen impaction:  Manual lavage by CMA Try Debrox solution OTC  RTC as needed or if symptoms persist or worsen

## 2014-05-26 ENCOUNTER — Ambulatory Visit (INDEPENDENT_AMBULATORY_CARE_PROVIDER_SITE_OTHER): Payer: Medicare Other | Admitting: *Deleted

## 2014-05-26 DIAGNOSIS — Z23 Encounter for immunization: Secondary | ICD-10-CM

## 2014-07-22 ENCOUNTER — Other Ambulatory Visit: Payer: Self-pay | Admitting: *Deleted

## 2014-07-22 MED ORDER — LISINOPRIL 20 MG PO TABS: 20.0000 mg | ORAL_TABLET | Freq: Every day | ORAL | Status: DC

## 2014-07-22 MED ORDER — AMLODIPINE BESYLATE 10 MG PO TABS: 10.0000 mg | ORAL_TABLET | Freq: Every day | ORAL | Status: DC

## 2014-07-22 MED ORDER — HYDROCHLOROTHIAZIDE 25 MG PO TABS: 25.0000 mg | ORAL_TABLET | Freq: Every day | ORAL | Status: DC

## 2014-07-31 ENCOUNTER — Encounter (HOSPITAL_COMMUNITY): Payer: Self-pay | Admitting: Emergency Medicine

## 2014-07-31 ENCOUNTER — Inpatient Hospital Stay (HOSPITAL_COMMUNITY)
Admission: EM | Admit: 2014-07-31 | Discharge: 2014-08-07 | DRG: 871 | Disposition: A | Discharge status: Home Health | Payer: Medicare Other | Attending: Internal Medicine | Admitting: Internal Medicine

## 2014-07-31 ENCOUNTER — Emergency Department (HOSPITAL_COMMUNITY): Payer: Medicare Other

## 2014-07-31 DIAGNOSIS — Z86010 Personal history of colonic polyps: Secondary | ICD-10-CM | POA: Diagnosis not present

## 2014-07-31 DIAGNOSIS — K222 Esophageal obstruction: Secondary | ICD-10-CM | POA: Diagnosis present

## 2014-07-31 DIAGNOSIS — I5189 Other ill-defined heart diseases: Secondary | ICD-10-CM | POA: Diagnosis present

## 2014-07-31 DIAGNOSIS — K219 Gastro-esophageal reflux disease without esophagitis: Secondary | ICD-10-CM | POA: Diagnosis present

## 2014-07-31 DIAGNOSIS — Z8701 Personal history of pneumonia (recurrent): Secondary | ICD-10-CM | POA: Diagnosis not present

## 2014-07-31 DIAGNOSIS — I5030 Unspecified diastolic (congestive) heart failure: Secondary | ICD-10-CM | POA: Diagnosis present

## 2014-07-31 DIAGNOSIS — N179 Acute kidney failure, unspecified: Secondary | ICD-10-CM | POA: Diagnosis present

## 2014-07-31 DIAGNOSIS — E876 Hypokalemia: Secondary | ICD-10-CM | POA: Diagnosis present

## 2014-07-31 DIAGNOSIS — J690 Pneumonitis due to inhalation of food and vomit: Secondary | ICD-10-CM | POA: Diagnosis present

## 2014-07-31 DIAGNOSIS — E871 Hypo-osmolality and hyponatremia: Secondary | ICD-10-CM | POA: Diagnosis present

## 2014-07-31 DIAGNOSIS — I10 Essential (primary) hypertension: Secondary | ICD-10-CM | POA: Diagnosis present

## 2014-07-31 DIAGNOSIS — J069 Acute upper respiratory infection, unspecified: Secondary | ICD-10-CM

## 2014-07-31 DIAGNOSIS — R0600 Dyspnea, unspecified: Secondary | ICD-10-CM | POA: Diagnosis present

## 2014-07-31 DIAGNOSIS — K224 Dyskinesia of esophagus: Secondary | ICD-10-CM | POA: Diagnosis present

## 2014-07-31 DIAGNOSIS — Z66 Do not resuscitate: Secondary | ICD-10-CM | POA: Diagnosis present

## 2014-07-31 DIAGNOSIS — J9601 Acute respiratory failure with hypoxia: Secondary | ICD-10-CM | POA: Diagnosis present

## 2014-07-31 DIAGNOSIS — E785 Hyperlipidemia, unspecified: Secondary | ICD-10-CM | POA: Diagnosis present

## 2014-07-31 DIAGNOSIS — I4891 Unspecified atrial fibrillation: Secondary | ICD-10-CM | POA: Diagnosis present

## 2014-07-31 DIAGNOSIS — Z8673 Personal history of transient ischemic attack (TIA), and cerebral infarction without residual deficits: Secondary | ICD-10-CM | POA: Diagnosis not present

## 2014-07-31 DIAGNOSIS — R0603 Acute respiratory distress: Secondary | ICD-10-CM | POA: Insufficient documentation

## 2014-07-31 DIAGNOSIS — R0902 Hypoxemia: Secondary | ICD-10-CM

## 2014-07-31 DIAGNOSIS — E872 Acidosis, unspecified: Secondary | ICD-10-CM | POA: Diagnosis present

## 2014-07-31 DIAGNOSIS — Z7982 Long term (current) use of aspirin: Secondary | ICD-10-CM | POA: Diagnosis not present

## 2014-07-31 DIAGNOSIS — R1310 Dysphagia, unspecified: Secondary | ICD-10-CM | POA: Diagnosis present

## 2014-07-31 DIAGNOSIS — J189 Pneumonia, unspecified organism: Secondary | ICD-10-CM | POA: Diagnosis present

## 2014-07-31 DIAGNOSIS — A419 Sepsis, unspecified organism: Secondary | ICD-10-CM | POA: Diagnosis present

## 2014-07-31 DIAGNOSIS — Z79899 Other long term (current) drug therapy: Secondary | ICD-10-CM | POA: Diagnosis not present

## 2014-07-31 DIAGNOSIS — I519 Heart disease, unspecified: Secondary | ICD-10-CM

## 2014-07-31 LAB — CBC WITH DIFFERENTIAL/PLATELET
Basophils Absolute: 0 10*3/uL (ref 0.0–0.1)
Basophils Relative: 0 % (ref 0–1)
Eosinophils Absolute: 0.1 10*3/uL (ref 0.0–0.7)
Eosinophils Relative: 0 % (ref 0–5)
HCT: 42.1 % (ref 39.0–52.0)
Hemoglobin: 13.5 g/dL (ref 13.0–17.0)
LYMPHS ABS: 2.3 10*3/uL (ref 0.7–4.0)
Lymphocytes Relative: 10 % — ABNORMAL LOW (ref 12–46)
MCH: 27.3 pg (ref 26.0–34.0)
MCHC: 32.1 g/dL (ref 30.0–36.0)
MCV: 85.1 fL (ref 78.0–100.0)
MONOS PCT: 3 % (ref 3–12)
Monocytes Absolute: 0.7 10*3/uL (ref 0.1–1.0)
NEUTROS ABS: 18.9 10*3/uL — AB (ref 1.7–7.7)
NEUTROS PCT: 87 % — AB (ref 43–77)
Platelets: 333 10*3/uL (ref 150–400)
RBC: 4.95 MIL/uL (ref 4.22–5.81)
RDW: 14.3 % (ref 11.5–15.5)
WBC: 21.9 10*3/uL — ABNORMAL HIGH (ref 4.0–10.5)

## 2014-07-31 LAB — COMPREHENSIVE METABOLIC PANEL
ALK PHOS: 106 U/L (ref 39–117)
ALT: 17 U/L (ref 0–53)
ANION GAP: 20 — AB (ref 5–15)
AST: 28 U/L (ref 0–37)
Albumin: 3.9 g/dL (ref 3.5–5.2)
BILIRUBIN TOTAL: 0.3 mg/dL (ref 0.3–1.2)
BUN: 18 mg/dL (ref 6–23)
CHLORIDE: 92 meq/L — AB (ref 96–112)
CO2: 23 meq/L (ref 19–32)
Calcium: 9.5 mg/dL (ref 8.4–10.5)
Creatinine, Ser: 1.04 mg/dL (ref 0.50–1.35)
GFR, EST AFRICAN AMERICAN: 72 mL/min — AB (ref >90)
GFR, EST NON AFRICAN AMERICAN: 62 mL/min — AB (ref >90)
GLUCOSE: 191 mg/dL — AB (ref 70–99)
POTASSIUM: 4.6 meq/L (ref 3.7–5.3)
Sodium: 135 mEq/L — ABNORMAL LOW (ref 137–147)
Total Protein: 8.2 g/dL (ref 6.0–8.3)

## 2014-07-31 LAB — TROPONIN I: Troponin I: <0.3 ng/mL (ref <0.30)

## 2014-07-31 LAB — PRO B NATRIURETIC PEPTIDE: PRO B NATRI PEPTIDE: 657.5 pg/mL — AB (ref 0–450)

## 2014-07-31 MED ORDER — DILTIAZEM LOAD VIA INFUSION
10.0000 mg | Freq: Once | INTRAVENOUS | Status: AC
  Administered 2014-07-31: 10 mg via INTRAVENOUS
  Filled 2014-07-31: qty 10

## 2014-07-31 MED ORDER — ACETAMINOPHEN 325 MG PO TABS
ORAL_TABLET | ORAL | Status: AC
  Filled 2014-07-31: qty 2

## 2014-07-31 MED ORDER — VANCOMYCIN HCL IN DEXTROSE 1-5 GM/200ML-% IV SOLN
INTRAVENOUS | Status: AC
  Filled 2014-07-31: qty 200

## 2014-07-31 MED ORDER — LABETALOL HCL 5 MG/ML IV SOLN: 10.0000 mg | Freq: Once | INTRAVENOUS | Status: DC

## 2014-07-31 MED ORDER — ONDANSETRON HCL 4 MG/2ML IJ SOLN
4.0000 mg | Freq: Once | INTRAMUSCULAR | Status: AC
  Administered 2014-07-31: 4 mg via INTRAVENOUS

## 2014-07-31 MED ORDER — ALBUTEROL SULFATE (2.5 MG/3ML) 0.083% IN NEBU
INHALATION_SOLUTION | RESPIRATORY_TRACT | Status: AC
  Filled 2014-07-31: qty 3

## 2014-07-31 MED ORDER — ALBUTEROL SULFATE (2.5 MG/3ML) 0.083% IN NEBU
5.0000 mg | INHALATION_SOLUTION | Freq: Once | RESPIRATORY_TRACT | Status: AC
  Administered 2014-07-31: 5 mg via RESPIRATORY_TRACT

## 2014-07-31 MED ORDER — PIPERACILLIN-TAZOBACTAM 3.375 G IVPB
INTRAVENOUS | Status: AC
  Filled 2014-07-31: qty 50

## 2014-07-31 MED ORDER — PIPERACILLIN-TAZOBACTAM 3.375 G IVPB
3.3750 g | Freq: Once | INTRAVENOUS | Status: AC
  Administered 2014-07-31: 3.375 g via INTRAVENOUS

## 2014-07-31 MED ORDER — DILTIAZEM HCL 100 MG IV SOLR
5.0000 mg/h | INTRAVENOUS | Status: DC
  Administered 2014-07-31: 5 mg/h via INTRAVENOUS
  Filled 2014-07-31: qty 100

## 2014-07-31 MED ORDER — NITROGLYCERIN 2 % TD OINT: 0.5000 [in_us] | TOPICAL_OINTMENT | Freq: Once | TRANSDERMAL | Status: DC

## 2014-07-31 MED ORDER — ONDANSETRON HCL 4 MG/2ML IJ SOLN
INTRAMUSCULAR | Status: AC
  Filled 2014-07-31: qty 2

## 2014-07-31 MED ORDER — ACETAMINOPHEN 325 MG PO TABS
650.0000 mg | ORAL_TABLET | Freq: Once | ORAL | Status: AC
  Administered 2014-07-31: 650 mg via ORAL

## 2014-07-31 MED ORDER — VANCOMYCIN HCL IN DEXTROSE 1-5 GM/200ML-% IV SOLN
1000.0000 mg | Freq: Once | INTRAVENOUS | Status: DC
  Administered 2014-07-31: 1000 mg via INTRAVENOUS

## 2014-07-31 NOTE — ED Notes (Signed)
Vanita Panda EDP made aware of CG4 Lactic results.

## 2014-07-31 NOTE — ED Notes (Signed)
RT contacted

## 2014-07-31 NOTE — ED Notes (Signed)
Room air saturation was 80%  Oxygen applied at 3 liters/min via [place]  Dr Vanita Panda in to see pt

## 2014-07-31 NOTE — ED Notes (Signed)
Pt states he has not been feeling good for the past few weeks but started feel worse today  Pt has audible congestion  Pt spitting up phlegm in triage  Pt states he has had chills today and is feeling very short of breath

## 2014-07-31 NOTE — ED Notes (Addendum)
Pt's wife reports pt has been sick for several days, became worse today.  Pt presents with regular labored breathing, unable to finish a whole sentence d/t SOB. Pt however, is A&Ox 4.  Denies any pain at this time.

## 2014-08-01 DIAGNOSIS — E872 Acidosis, unspecified: Secondary | ICD-10-CM | POA: Diagnosis present

## 2014-08-01 DIAGNOSIS — I4891 Unspecified atrial fibrillation: Secondary | ICD-10-CM | POA: Diagnosis present

## 2014-08-01 DIAGNOSIS — I10 Essential (primary) hypertension: Secondary | ICD-10-CM | POA: Diagnosis present

## 2014-08-01 DIAGNOSIS — I5189 Other ill-defined heart diseases: Secondary | ICD-10-CM | POA: Diagnosis present

## 2014-08-01 DIAGNOSIS — J069 Acute upper respiratory infection, unspecified: Secondary | ICD-10-CM | POA: Diagnosis present

## 2014-08-01 LAB — CBC WITH DIFFERENTIAL/PLATELET
BASOS PCT: 0 % (ref 0–1)
Basophils Absolute: 0 10*3/uL (ref 0.0–0.1)
Eosinophils Absolute: 0 10*3/uL (ref 0.0–0.7)
Eosinophils Relative: 0 % (ref 0–5)
HCT: 35.9 % — ABNORMAL LOW (ref 39.0–52.0)
Hemoglobin: 11.5 g/dL — ABNORMAL LOW (ref 13.0–17.0)
Lymphocytes Relative: 8 % — ABNORMAL LOW (ref 12–46)
Lymphs Abs: 1.6 10*3/uL (ref 0.7–4.0)
MCH: 27.1 pg (ref 26.0–34.0)
MCHC: 32 g/dL (ref 30.0–36.0)
MCV: 84.7 fL (ref 78.0–100.0)
MONO ABS: 1 10*3/uL (ref 0.1–1.0)
Monocytes Relative: 5 % (ref 3–12)
NEUTROS PCT: 88 % — AB (ref 43–77)
Neutro Abs: 18 10*3/uL — ABNORMAL HIGH (ref 1.7–7.7)
Platelets: ADEQUATE 10*3/uL (ref 150–400)
RBC: 4.24 MIL/uL (ref 4.22–5.81)
RDW: 14.5 % (ref 11.5–15.5)
WBC: 20.5 10*3/uL — AB (ref 4.0–10.5)

## 2014-08-01 LAB — MRSA PCR SCREENING: MRSA BY PCR: NEGATIVE

## 2014-08-01 LAB — URINALYSIS, ROUTINE W REFLEX MICROSCOPIC
BILIRUBIN URINE: NEGATIVE
Glucose, UA: 100 mg/dL — AB
KETONES UR: NEGATIVE mg/dL
Leukocytes, UA: NEGATIVE
Nitrite: NEGATIVE
SPECIFIC GRAVITY, URINE: 1.019 (ref 1.005–1.030)
UROBILINOGEN UA: 1 mg/dL (ref 0.0–1.0)
pH: 5.5 (ref 5.0–8.0)

## 2014-08-01 LAB — COMPREHENSIVE METABOLIC PANEL
ALBUMIN: 2.8 g/dL — AB (ref 3.5–5.2)
ALT: 16 U/L (ref 0–53)
AST: 22 U/L (ref 0–37)
Alkaline Phosphatase: 76 U/L (ref 39–117)
Anion gap: 13 (ref 5–15)
BUN: 25 mg/dL — ABNORMAL HIGH (ref 6–23)
CO2: 27 mEq/L (ref 19–32)
CREATININE: 1.63 mg/dL — AB (ref 0.50–1.35)
Calcium: 8.4 mg/dL (ref 8.4–10.5)
Chloride: 94 mEq/L — ABNORMAL LOW (ref 96–112)
GFR calc Af Amer: 42 mL/min — ABNORMAL LOW (ref >90)
GFR calc non Af Amer: 36 mL/min — ABNORMAL LOW (ref >90)
Glucose, Bld: 115 mg/dL — ABNORMAL HIGH (ref 70–99)
Potassium: 4.3 mEq/L (ref 3.7–5.3)
SODIUM: 134 meq/L — AB (ref 137–147)
Total Bilirubin: 0.5 mg/dL (ref 0.3–1.2)
Total Protein: 6.3 g/dL (ref 6.0–8.3)

## 2014-08-01 LAB — BLOOD GAS, ARTERIAL
Acid-Base Excess: 3.7 mmol/L — ABNORMAL HIGH (ref 0.0–2.0)
BICARBONATE: 28.2 meq/L — AB (ref 20.0–24.0)
DRAWN BY: 31814
FIO2: 0.32 %
O2 Saturation: 94.8 %
PATIENT TEMPERATURE: 100
PCO2 ART: 46 mmHg — AB (ref 35.0–45.0)
PO2 ART: 74.9 mmHg — AB (ref 80.0–100.0)
TCO2: 25.3 mmol/L (ref 0–100)
pH, Arterial: 7.409 (ref 7.350–7.450)

## 2014-08-01 LAB — PROTIME-INR
INR: 1.08 (ref 0.00–1.49)
PROTHROMBIN TIME: 14.2 s (ref 11.6–15.2)

## 2014-08-01 LAB — STREP PNEUMONIAE URINARY ANTIGEN: STREP PNEUMO URINARY ANTIGEN: NEGATIVE

## 2014-08-01 LAB — TSH: TSH: 1.69 u[IU]/mL (ref 0.350–4.500)

## 2014-08-01 LAB — INFLUENZA PANEL BY PCR (TYPE A & B)
H1N1FLUPCR: NOT DETECTED
INFLAPCR: NEGATIVE
INFLBPCR: NEGATIVE

## 2014-08-01 LAB — URINE MICROSCOPIC-ADD ON

## 2014-08-01 MED ORDER — VANCOMYCIN HCL IN DEXTROSE 1-5 GM/200ML-% IV SOLN
1000.0000 mg | Freq: Once | INTRAVENOUS | Status: AC
  Administered 2014-08-01: 1000 mg via INTRAVENOUS

## 2014-08-01 MED ORDER — VANCOMYCIN HCL 500 MG IV SOLR: 500.0000 mg | Freq: Two times a day (BID) | INTRAVENOUS | Status: DC

## 2014-08-01 MED ORDER — SODIUM CHLORIDE 0.9 % IV BOLUS (SEPSIS)
500.0000 mL | Freq: Once | INTRAVENOUS | Status: AC
  Administered 2014-08-01: 500 mL via INTRAVENOUS

## 2014-08-01 MED ORDER — DILTIAZEM HCL 100 MG IV SOLR
5.0000 mg/h | INTRAVENOUS | Status: DC
  Administered 2014-08-01: 5 mg/h via INTRAVENOUS
  Filled 2014-08-01: qty 100

## 2014-08-01 MED ORDER — VANCOMYCIN HCL IN DEXTROSE 750-5 MG/150ML-% IV SOLN
750.0000 mg | INTRAVENOUS | Status: DC
  Administered 2014-08-02: 750 mg via INTRAVENOUS
  Filled 2014-08-01: qty 150

## 2014-08-01 MED ORDER — AZITHROMYCIN 500 MG IV SOLR
500.0000 mg | INTRAVENOUS | Status: DC
  Administered 2014-08-01 – 2014-08-06 (×6): 500 mg via INTRAVENOUS
  Filled 2014-08-01 (×6): qty 500

## 2014-08-01 MED ORDER — ASPIRIN EC 81 MG PO TBEC
81.0000 mg | DELAYED_RELEASE_TABLET | Freq: Every day | ORAL | Status: DC
  Administered 2014-08-01 – 2014-08-07 (×7): 81 mg via ORAL
  Filled 2014-08-01 (×5): qty 1

## 2014-08-01 MED ORDER — PIPERACILLIN-TAZOBACTAM 3.375 G IVPB
3.3750 g | Freq: Three times a day (TID) | INTRAVENOUS | Status: DC
  Administered 2014-08-01 – 2014-08-07 (×18): 3.375 g via INTRAVENOUS
  Filled 2014-08-01 (×16): qty 50

## 2014-08-01 MED ORDER — SIMVASTATIN 20 MG PO TABS: 20.0000 mg | ORAL_TABLET | Freq: Every day | ORAL | Status: DC

## 2014-08-01 MED ORDER — ATORVASTATIN CALCIUM 10 MG PO TABS
10.0000 mg | ORAL_TABLET | Freq: Every day | ORAL | Status: DC
  Administered 2014-08-01 – 2014-08-06 (×6): 10 mg via ORAL
  Filled 2014-08-01 (×6): qty 1

## 2014-08-01 MED ORDER — SODIUM CHLORIDE 0.9 % IV SOLN
INTRAVENOUS | Status: DC
  Administered 2014-08-01 – 2014-08-06 (×3): via INTRAVENOUS

## 2014-08-01 MED ORDER — ENOXAPARIN SODIUM 40 MG/0.4ML ~~LOC~~ SOLN
40.0000 mg | SUBCUTANEOUS | Status: DC
  Administered 2014-08-01 – 2014-08-07 (×7): 40 mg via SUBCUTANEOUS
  Filled 2014-08-01 (×5): qty 0.4

## 2014-08-01 NOTE — Progress Notes (Signed)
ANTIBIOTIC CONSULT NOTE - INITIAL  Pharmacy Consult for Zosyn and Vancomycin Indication: pneumonia  No Known Allergies  Patient Measurements:   Wt=66 kg  Vital Signs: Temp: 100 F (37.8 C) (12/03 2339) Temp Source: Oral (12/03 2339) BP: 127/43 mmHg (12/04 0000) Pulse Rate: 62 (12/04 0000) Intake/Output from previous day:   Intake/Output from this shift:    Labs:  Recent Labs  07/31/14 2154  WBC 21.9*  HGB 13.5  PLT 333  CREATININE 1.04   CrCl cannot be calculated (Unknown ideal weight.). No results for input(s): VANCOTROUGH, VANCOPEAK, VANCORANDOM, GENTTROUGH, GENTPEAK, GENTRANDOM, TOBRATROUGH, TOBRAPEAK, TOBRARND, AMIKACINPEAK, AMIKACINTROU, AMIKACIN in the last 72 hours.   Microbiology: No results found for this or any previous visit (from the past 720 hour(s)).  Medical History: Past Medical History  Diagnosis Date  . Hypertension   . Hyperlipidemia   . Tobacco abuse   . Lung nodule     Left lung, seen 2012, no change in 2012, no follow up needed   . PNA (pneumonia) 2010    Bilateral Pneumonia, COPD 74mm LLL Nodule   . History of ETT 09/1991     POS   . History of MRI 04-22-06    L/S- right hnp  l5/si   . B12 deficiency   . TIA (transient ischemic attack)   . GERD (gastroesophageal reflux disease)   . Esophageal stricture   . Hemorrhoids   . Colon polyps     hyperplastic    Medications:  Scheduled:  . aspirin EC  81 mg Oral Daily  . enoxaparin (LOVENOX) injection  40 mg Subcutaneous Q24H  . simvastatin  20 mg Oral q1800   Infusions:  . sodium chloride    . diltiazem (CARDIZEM) infusion 5 mg/hr (07/31/14 2216)  . piperacillin-tazobactam (ZOSYN)  IV 3.375 g (07/31/14 2358)  . piperacillin-tazobactam (ZOSYN)  IV    . vancomycin     Assessment: 74 yoM c/o cough, generalized discomfort and SOB. Zosyn and Vancomycin per Rx for PNA.   Goal of Therapy:  Vancomycin trough level 15-20 mcg/ml  Plan:   Zosyn 3.375 gm IV q8h EI  Vancomycin 1GM x1  then 500mg  IV q12h  F/U SCr/levels/cultures as needed  Lawana Pai R 08/01/2014,12:26 AM

## 2014-08-01 NOTE — Progress Notes (Signed)
[facility]  Patient admitted over night for CAP vs Acute bronchitis, sepsis and A-fib with RVR. States he his somewhat better already- cough improving.   Principal Problem:   CAP (community acquired pneumonia)/  Sepsis/ Acute resp failure / Lactic acidosis - cont Vanc and Zosyn, Add Zithromax for atypical coverage - Influenza negative- will obtain U strep and Legionella - on 3 L of O2- wean as tolerated  Active Problems:    H/o Dysphagia - monitor for aspiration- not on a specialized diet at home    Atrial fibrillation with RVR- new diagnosis - has converted to NSR- stop cardizem infusion as he is becoming bradycardic - obtain ECHO and TFTs    Diastolic dysfunction - Grade 1- cont IVF     Essential hypertension - Hypotensive- hold HCTZ Lisinoprol and Amlodipine  Hyponatremia - due to HCTZ and possibly dehydration?  ARF - due to sepsis?- Granular casts on UA suggestive of ATN  - follow   Debbe Odea, MD

## 2014-08-01 NOTE — Plan of Care (Signed)
Problem: ICU Phase Progression Outcomes Goal: Hemodynamically stable Outcome: Completed/Met Date Met:  08/01/14     

## 2014-08-01 NOTE — Plan of Care (Signed)
Problem: ICU Phase Progression Outcomes Goal: Voiding-avoid urinary catheter unless indicated Outcome: Completed/Met Date Met:  08/01/14

## 2014-08-01 NOTE — Plan of Care (Signed)
Problem: Consults Goal: Nutrition Consult-if indicated Outcome: Completed/Met Date Met:  08/01/14

## 2014-08-01 NOTE — ED Notes (Signed)
RT at bedside to obtain ABG.

## 2014-08-01 NOTE — Plan of Care (Signed)
Problem: ICU Phase Progression Outcomes Goal: Pain controlled with appropriate interventions Outcome: Completed/Met Date Met:  08/01/14

## 2014-08-01 NOTE — Plan of Care (Signed)
Problem: Consults Goal: Skin Care Protocol Initiated - if Braden Score 18 or less If consults are not indicated, leave blank or document N/A Outcome: Completed/Met Date Met:  08/01/14

## 2014-08-01 NOTE — Progress Notes (Signed)
The order for simvastatin(Zocor) was changed to an equivalent dose of atorvastatin(Lipitor) due to the potential drug interaction with Cardizem (drip).  When taken in combination with medications that inhibit its metabolism, simvastatin can accumulate which increases the risk of liver toxicity, myopathy, or rhabdomyolysis.  Simvastatin dose should not exceed 10mg /day in patients taking verapamil, diltiazem, fibrates, or niacin >or= 1g/day.   Simvastatin dose should not exceed 20mg /day in patients taking amlodipine, ranolazine or amiodarone.   Please consider this potential interaction at discharge.  Dorrene German 08/01/2014 5:06 AM

## 2014-08-01 NOTE — ED Provider Notes (Signed)
CSN: [id number]     Arrival date & time 07/31/14  2135 History   First MD Initiated Contact with Patient 07/31/14 2139     Chief Complaint  Patient presents with  . Shortness of Breath     HPI  Patient presents with cough, dyspnea, generalized discomfort. Symptoms have been present for several days, though progressed over the past day. He notes that he has a history of recurrent pneumonia, was previously a smoker. He does not recall fever, does acknowledge chills. He denies confusion, disorientation, syncope. He acknowledges nausea, vomiting.    Past Medical History  Diagnosis Date  . Hypertension   . Hyperlipidemia   . Tobacco abuse   . Lung nodule     Left lung, seen 2012, no change in 2012, no follow up needed   . PNA (pneumonia) 2010    Bilateral Pneumonia, COPD 23m LLL Nodule   . History of ETT 09/1991     POS   . History of MRI 04-22-06    L/S- right hnp  l5/si   . B12 deficiency   . TIA (transient ischemic attack)   . GERD (gastroesophageal reflux disease)   . Esophageal stricture   . Hemorrhoids   . Colon polyps     hyperplastic   Past Surgical History  Procedure Laterality Date  . Cystectomy  1995    Lumbar area    Family History  Problem Relation Age of Onset  . Hypertension Mother   . Heart attack Father   . Colon cancer Neg Hx   . Esophageal cancer Neg Hx    History  Substance Use Topics  . Smoking status: Former Smoker    Types: Cigarettes    Quit date: 08/29/1985  . Smokeless tobacco: Former [employer] developer   Types: Chew  . Alcohol Use: No    Review of Systems  Constitutional:       Per HPI, otherwise negative  HENT:       Per HPI, otherwise negative  Respiratory:       Per HPI, otherwise negative  Cardiovascular:       Per HPI, otherwise negative  Gastrointestinal: Negative for vomiting.  Endocrine:       Negative aside from HPI  Genitourinary:       Neg aside from HPI   Musculoskeletal:       Per HPI, otherwise negative  Skin:  Negative.   Neurological: Negative for syncope.      Allergies  Review of patient's allergies indicates no known allergies.  Home Medications   Prior to Admission medications   Medication Sig Start Date End Date Taking? Authorizing Provider  albuterol (PROVENTIL HFA;VENTOLIN HFA) 108 (90 BASE) MCG/ACT inhaler Inhale 2 puffs into the lungs every 6 (six) hours as needed for wheezing or shortness of breath. 12/20/13  Yes Costin MKarlyne Greenspan MD  aspirin EC 81 MG tablet Take 81 mg by mouth daily.   Yes Historical Provider, MD  fluorouracil (EFUDEX) 5 % cream Apply 1 application topically daily.  08/04/11  Yes Historical Provider, MD  hydrochlorothiazide (HYDRODIURIL) 25 MG tablet Take 1 tablet (25 mg total) by mouth daily. 07/22/14  Yes GTonia Ghent MD  lisinopril (PRINIVIL,ZESTRIL) 20 MG tablet Take 1 tablet (20 mg total) by mouth daily. 07/22/14  Yes GTonia Ghent MD  Multiple Vitamins-Minerals (PRESERVISION AREDS 2) CAPS Take as directed. 03/16/14  Yes GTonia Ghent MD  simvastatin (ZOCOR) 20 MG tablet Take 1 tab by mouth at night 03/04/14  Yes Tonia Ghent, MD  amLODipine (NORVASC) 10 MG tablet Take 1 tablet (10 mg total) by mouth daily. 07/22/14   Tonia Ghent, MD   BP 127/43 mmHg  Pulse 62  Temp(Src) 100 F (37.8 C) (Oral)  Resp 23  SpO2 95% Physical Exam  Constitutional: He is oriented to person, place, and time. He appears well-developed. He appears distressed.  HENT:  Head: Normocephalic and atraumatic.  Eyes: Conjunctivae and EOM are normal.  Cardiovascular: An irregularly irregular rhythm present. Tachycardia present.   Pulmonary/Chest: Accessory muscle usage present. No stridor. Tachypnea noted. He is in respiratory distress. He has decreased breath sounds. He has wheezes.  Abdominal: He exhibits no distension.  Musculoskeletal: He exhibits no edema.  Neurological: He is alert and oriented to person, place, and time.  Skin: Skin is warm. He is diaphoretic.   Psychiatric: He has a normal mood and affect.  Nursing note and vitals reviewed.   ED Course  Procedures (including critical care time) Labs Review Labs Reviewed  CBC WITH DIFFERENTIAL - Abnormal; Notable for the following:    WBC 21.9 (*)    Neutrophils Relative % 87 (*)    Neutro Abs 18.9 (*)    Lymphocytes Relative 10 (*)    All other components within normal limits  COMPREHENSIVE METABOLIC PANEL - Abnormal; Notable for the following:    Sodium 135 (*)    Chloride 92 (*)    Glucose, Bld 191 (*)    GFR calc non Af Amer 62 (*)    GFR calc Af Amer 72 (*)    Anion gap 20 (*)    All other components within normal limits  PRO B NATRIURETIC PEPTIDE - Abnormal; Notable for the following:    Pro B Natriuretic peptide (BNP) 657.5 (*)    All other components within normal limits  CULTURE, BLOOD (ROUTINE X 2)  CULTURE, BLOOD (ROUTINE X 2)  CULTURE, EXPECTORATED SPUTUM-ASSESSMENT  GRAM STAIN  TROPONIN I  URINALYSIS, ROUTINE W REFLEX MICROSCOPIC  BLOOD GAS, ARTERIAL  LEGIONELLA ANTIGEN, URINE  STREP PNEUMONIAE URINARY ANTIGEN  INFLUENZA PANEL BY PCR (TYPE A & B, H1N1)  I-STAT CG4 LACTIC ACID, ED    Given the patient's respiratory distress on initial exam, and some concern for hypertension (initial BP >200 sbp), tachycardia, with irregular rhythm suggesting A. fib/RVR, patient was started on diltiazem, bolus, drip.  Patient also received albuterol treatment.  Subsequent, the patient's heart rate remained in the 130 - 140  range  Imaging Review XR performed, but not available 2/2 computer issues during the patient's evaluation.  On repeat exam after x-ray, initial albuterol, patient was nauseous. After one episode of vomiting, he stated he felt better. Patient continued to have tachypnea, though decreased work of breathing, breath sounds were slightly improved. Heart rate was managed, 110-  Patient's initial labs notable for lactic acidosis 6+, leukocytosis greater than  20,000. Patient was found to be febrile after his initial evaluation as well.  Patient received empiric antibiotics. MDM   Final diagnoses:  Respiratory distress   sepsis, A. fib/RVR  This patient presents in respiratory distress.  The patient's history of pneumonia, his initial findings of lactic acidosis, fever, he was treated empirically with broad-spectrum antibiotics, as he met multiple SIRS criteria. Patient had improvement in his respiratory function with albuterol therapy. Patient received IV fluid resuscitation, without recent echocardiogram, fluid resuscitation was not excessive. Given the patient's A. fib/Sirs, concern for pneumonia he was admitted to stepdown unit for further evaluation  and management.  CRITICAL CARE Performed by: Carmin Muskrat Total critical care time: 35 Critical care time was exclusive of separately billable procedures and treating other patients. Critical care was necessary to treat or prevent imminent or life-threatening deterioration. Critical care was time spent personally by me on the following activities: development of treatment plan with patient and/or surrogate as well as nursing, discussions with consultants, evaluation of patient's response to treatment, examination of patient, obtaining history from patient or surrogate, ordering and performing treatments and interventions, ordering and review of laboratory studies, ordering and review of radiographic studies, pulse oximetry and re-evaluation of patient's condition.     Carmin Muskrat, MD 08/01/14 Laureen Abrahams

## 2014-08-01 NOTE — Progress Notes (Addendum)
ANTIBIOTIC CONSULT NOTE  Pharmacy Consult for Zosyn and Vancomycin Indication: pneumonia  No Known Allergies  Patient Measurements: Height: 5\' 8"  (172.7 cm) Weight: 143 lb 4.8 oz (65 kg) IBW/kg (Calculated) : 68.4 Wt=66 kg  Vital Signs: Temp: 98.7 F (37.1 C) (12/04 0400) Temp Source: Oral (12/04 0400) BP: 124/48 mmHg (12/04 0600) Pulse Rate: 69 (12/04 0600) Intake/Output from previous day: 12/03 0701 - 12/04 0700 In: 385.8 [I.V.:335.8; IV Piggyback:50] Out: -  Intake/Output from this shift:    Labs:  Recent Labs  07/31/14 2154 08/01/14 0700  WBC 21.9* 20.5*  HGB 13.5 11.5*  PLT 333 PLATELET CLUMPS NOTED ON SMEAR, COUNT APPEARS ADEQUATE  CREATININE 1.04 1.63*   Estimated Creatinine Clearance: 29.4 mL/min (by C-G formula based on Cr of 1.63). No results for input(s): VANCOTROUGH, VANCOPEAK, VANCORANDOM, GENTTROUGH, GENTPEAK, GENTRANDOM, TOBRATROUGH, TOBRAPEAK, TOBRARND, AMIKACINPEAK, AMIKACINTROU, AMIKACIN in the last 72 hours.   Microbiology: No results found for this or any previous visit (from the past 720 hour(s)).  Medical History: Past Medical History  Diagnosis Date  . Hypertension   . Hyperlipidemia   . Tobacco abuse   . Lung nodule     Left lung, seen 2012, no change in 2012, no follow up needed   . PNA (pneumonia) 2010    Bilateral Pneumonia, COPD 73mm LLL Nodule   . History of ETT 09/1991     POS   . History of MRI 04-22-06    L/S- right hnp  l5/si   . B12 deficiency   . TIA (transient ischemic attack)   . GERD (gastroesophageal reflux disease)   . Esophageal stricture   . Hemorrhoids   . Colon polyps     hyperplastic    Medications:  Scheduled:  . aspirin EC  81 mg Oral Daily  . atorvastatin  10 mg Oral q1800  . azithromycin  500 mg Intravenous Q24H  . enoxaparin (LOVENOX) injection  40 mg Subcutaneous Q24H  . piperacillin-tazobactam (ZOSYN)  IV  3.375 g Intravenous Q8H  . vancomycin  500 mg Intravenous Q12H   Infusions:  . sodium  chloride 75 mL/hr at 08/01/14 0600  . diltiazem (CARDIZEM) infusion 5 mg/hr (08/01/14 0600)   Assessment: 89 yoM c/o cough, generalized discomfort and SOB. Zosyn and Vancomycin per Rx for PNA.   12/4 >>zosyn >> 12/4 >>vancomycin >>  12/4 >> Azithromycin >>  Tmax: 100 WBCs: elevated Renal: SCr 1.63 (worsening) CrCl ~ 30 CG  Goal of Therapy:  Vancomycin trough level 15-20 mcg/ml  Appropriate antibiotic dosing for renal function; eradication of infection  Plan:   Continue Zosyn 3.375 gm IV q8h (infused over 4 hours)  Decrease Vancomycin to 750mg  IV Q24H (d/t worsening renal function)  F/U SCr/levels/cultures as needed  Kizzie Furnish, PharmD Pager: [phone] 08/01/2014 8:09 AM

## 2014-08-01 NOTE — ED Notes (Signed)
MD at bedside.  [facility] 

## 2014-08-01 NOTE — H&P (Addendum)
[facility] History and Physical  Patient: [name]  UMP:[id number]  DOB: 1925/05/12  DOS: the patient was seen and examined on 07/31/2014 PCP: [name], MD  Chief Complaint: Generalized weakness and cough  HPI: [name] is a 80 y.o. male with Past medical history of hypertension, esophageal stricture, dysphagia, diastolic dysfunction. The patient is presenting with complaints of cough and shortness of breath as well as not feeling better since last few weeks. Based was obtained from patient as well as his wife. It is unclear how long the symptoms have been ongoing but as per the wife patient's symptoms at least have been ongoing since Halloween. No sick contacts reported. Patient did have cough as well as wheezing which was progressively worsening and not improving. Patient did not have any treatment as an outpatient. No fever or chills reported but patient has been having generalized weakness. And today he was unable to take any deep breath and therefore he decided to come to the hospital. Patient did have an episode of vomiting while in the ER but denied any nausea at the time of my evaluation no abdominal pain no diarrhea no constipation. Patient has not been able to urinate enough. Note confusion reported but did have some dizziness no focal deficit.  The patient is coming from home And at his baseline independent for most of his ADL.  Review of Systems: as mentioned in the history of present illness.  A Comprehensive review of the other systems is negative.  Past Medical History  Diagnosis Date  . Hypertension   . Hyperlipidemia   . Tobacco abuse   . Lung nodule     Left lung, seen 2012, no change in 2012, no follow up needed   . PNA (pneumonia) 2010    Bilateral Pneumonia, COPD 39mm LLL Nodule   . History of ETT 09/1991     POS   . History of MRI 04-22-06    L/S- right hnp  l5/si   . B12 deficiency   . TIA (transient ischemic attack)   . GERD  (gastroesophageal reflux disease)   . Esophageal stricture   . Hemorrhoids   . Colon polyps     hyperplastic   Past Surgical History  Procedure Laterality Date  . Cystectomy  1995    Lumbar area    Social History:  reports that he quit smoking about 28 years ago. His smoking use included Cigarettes. He smoked 0.00 packs per day. He has quit using smokeless tobacco. His smokeless tobacco use included Chew. He reports that he does not drink alcohol or use illicit drugs.  No Known Allergies  Family History  Problem Relation Age of Onset  . Hypertension Mother   . Heart attack Father   . Colon cancer Neg Hx   . Esophageal cancer Neg Hx     Prior to Admission medications   Medication Sig Start Date End Date Taking? Authorizing Provider  albuterol (PROVENTIL HFA;VENTOLIN HFA) 108 (90 BASE) MCG/ACT inhaler Inhale 2 puffs into the lungs every 6 (six) hours as needed for wheezing or shortness of breath. 12/20/13  Yes Costin Karlyne Greenspan, MD  aspirin EC 81 MG tablet Take 81 mg by mouth daily.   Yes Historical Provider, MD  fluorouracil (EFUDEX) 5 % cream Apply 1 application topically daily.  08/04/11  Yes Historical Provider, MD  hydrochlorothiazide (HYDRODIURIL) 25 MG tablet Take 1 tablet (25 mg total) by mouth daily. 07/22/14  Yes Tonia Ghent, MD  lisinopril (PRINIVIL,ZESTRIL)  20 MG tablet Take 1 tablet (20 mg total) by mouth daily. 07/22/14  Yes Tonia Ghent, MD  Multiple Vitamins-Minerals (PRESERVISION AREDS 2) CAPS Take as directed. 03/16/14  Yes Tonia Ghent, MD  simvastatin (ZOCOR) 20 MG tablet Take 1 tab by mouth at night 03/04/14  Yes Tonia Ghent, MD  amLODipine (NORVASC) 10 MG tablet Take 1 tablet (10 mg total) by mouth daily. 07/22/14   Tonia Ghent, MD    Physical Exam: Filed Vitals:   07/31/14 2330 07/31/14 2339 07/31/14 2345 08/01/14 0000  BP: 114/42  109/71 127/43  Pulse: 59  48 62  Temp:  100 F (37.8 C)    TempSrc:  Oral    Resp: 25  23 23   SpO2: 95%  89%  95%    General: Alert, Awake and Oriented to Time, Place and Person. Appear in mild distress Eyes: PERRL ENT: Oral Mucosa clear moist. Neck: no JVD Cardiovascular: S1 and S2 Present, no Murmur, Peripheral Pulses Present Respiratory: Bilateral Air entry equal and Decreased, bilateral rhonchi and basal Crackles, no wheezes Abdomen: Bowel Sound present, Soft and non tender Skin: no Rash Extremities: no Pedal edema, no calf tenderness Neurologic: Grossly no focal neuro deficit.  Labs on Admission:  CBC:  Recent Labs Lab 07/31/14 2154  WBC 21.9*  NEUTROABS 18.9*  HGB 13.5  HCT 42.1  MCV 85.1  PLT 333    CMP     Component Value Date/Time   NA 135* 07/31/2014 2154   K 4.6 07/31/2014 2154   CL 92* 07/31/2014 2154   CO2 23 07/31/2014 2154   GLUCOSE 191* 07/31/2014 2154   BUN 18 07/31/2014 2154   CREATININE 1.04 07/31/2014 2154   CALCIUM 9.5 07/31/2014 2154   PROT 8.2 07/31/2014 2154   ALBUMIN 3.9 07/31/2014 2154   AST 28 07/31/2014 2154   ALT 17 07/31/2014 2154   ALKPHOS 106 07/31/2014 2154   BILITOT 0.3 07/31/2014 2154   GFRNONAA 62* 07/31/2014 2154   GFRAA 72* 07/31/2014 2154    No results for input(s): LIPASE, AMYLASE in the last 168 hours. No results for input(s): AMMONIA in the last 168 hours.   Recent Labs Lab 07/31/14 2154  TROPONINI <0.30   BNP (last 3 results)  Recent Labs  12/17/13 0422 01/09/14 1250 07/31/14 2154  PROBNP 1657.0* 131.0* 657.5*    Radiological Exams on Admission: No results found. Chest x-ray shows possible left-sided infiltrate  Assessment/Plan Principal Problem:   CAP (community acquired pneumonia) Active Problems:   Sepsis   Dysphagia   Acute respiratory disease   Atrial fibrillation with RVR   Diastolic dysfunction   Essential hypertension   Lactic acidosis   1. CAP (community acquired pneumonia) The patient is presenting with complaints of cough and shortness of breath as well as generalized weakness. He is  hypoxic and has leukocytosis as well as fever. He was also found to have A. fib with RVR. His lactic acid level was 6. With this most likely the patient has sepsis secondary to community-acquired pneumonia versus atypical pneumonia versus acute bronchitis. At present the patient will be admitted in stepdown unit. I will give gentle IV hydration. Hold his hydrochlorothiazide. He will be receiving broad-spectrum antibiotic vancomycin and Zosyn. Follow sputum culture blood culture as well as urine antigens. Check influenza PCR. Nebulizations as needed.  2. A. fib with RVR. Patient's blood pressure at present stable currently on Cardizem drip. We'll continue to closely monitor.  3. Diastolic dysfunction.  Monitoring of  ins and outs as well as a volume status.  4. Dysphagia. Patient may have aspirated. Continue close monitoring. Aspiration precaution  Advance goals of care discussion: DNR/DNI as per my discussion with patient as well as his wife   DVT Prophylaxis: subcutaneous Heparin Nutrition: Cardiac diet  Family Communication: Wife was present at bedside, opportunity was given to ask question and all questions were answered satisfactorily at the time of interview. Disposition: Admitted to inpatient in step-down unit.  Author: Berle Mull, MD [facility] Pager: [phone]   If 7PM-7AM, please contact night-coverage [email or web address] Password [email or web address]

## 2014-08-02 ENCOUNTER — Inpatient Hospital Stay (HOSPITAL_COMMUNITY): Payer: Medicare Other

## 2014-08-02 ENCOUNTER — Telehealth: Payer: Self-pay | Admitting: Family Medicine

## 2014-08-02 LAB — BASIC METABOLIC PANEL
Anion gap: 9 (ref 5–15)
Anion gap: 7 (ref 5–15)
BUN: 25 mg/dL — AB (ref 6–23)
BUN: 20 mg/dL (ref 6–23)
CALCIUM: 8.5 mg/dL (ref 8.4–10.5)
CALCIUM: 8.6 mg/dL (ref 8.4–10.5)
CO2: 29 meq/L (ref 19–32)
CO2: 31 mEq/L (ref 19–32)
CREATININE: 1.46 mg/dL — AB (ref 0.50–1.35)
Chloride: 96 mEq/L (ref 96–112)
Chloride: 95 mEq/L — ABNORMAL LOW (ref 96–112)
Creatinine, Ser: 1.3 mg/dL (ref 0.50–1.35)
GFR calc Af Amer: 48 mL/min — ABNORMAL LOW (ref >90)
GFR calc Af Amer: 55 mL/min — ABNORMAL LOW (ref >90)
GFR, EST NON AFRICAN AMERICAN: 41 mL/min — AB (ref >90)
GFR, EST NON AFRICAN AMERICAN: 48 mL/min — AB (ref >90)
GLUCOSE: 113 mg/dL — AB (ref 70–99)
Glucose, Bld: 91 mg/dL (ref 70–99)
POTASSIUM: 4.6 meq/L (ref 3.7–5.3)
Potassium: 4.7 mEq/L (ref 3.7–5.3)
SODIUM: 134 meq/L — AB (ref 137–147)
SODIUM: 133 meq/L — AB (ref 137–147)

## 2014-08-02 LAB — STREP PNEUMONIAE URINARY ANTIGEN: STREP PNEUMO URINARY ANTIGEN: NEGATIVE

## 2014-08-02 LAB — CBC
HCT: 33.2 % — ABNORMAL LOW (ref 39.0–52.0)
Hemoglobin: 10.5 g/dL — ABNORMAL LOW (ref 13.0–17.0)
MCH: 27.3 pg (ref 26.0–34.0)
MCHC: 31.6 g/dL (ref 30.0–36.0)
MCV: 86.2 fL (ref 78.0–100.0)
PLATELETS: 238 10*3/uL (ref 150–400)
RBC: 3.85 MIL/uL — ABNORMAL LOW (ref 4.22–5.81)
RDW: 14.9 % (ref 11.5–15.5)
WBC: 15.4 10*3/uL — ABNORMAL HIGH (ref 4.0–10.5)

## 2014-08-02 LAB — EXPECTORATED SPUTUM ASSESSMENT W REFEX TO RESP CULTURE

## 2014-08-02 LAB — T4, FREE: Free T4: 0.92 ng/dL (ref 0.80–1.80)

## 2014-08-02 LAB — EXPECTORATED SPUTUM ASSESSMENT W GRAM STAIN, RFLX TO RESP C

## 2014-08-02 MED ORDER — ASPIRIN 81 MG PO CHEW
CHEWABLE_TABLET | ORAL | Status: AC
  Filled 2014-08-02: qty 1

## 2014-08-02 MED ORDER — ENOXAPARIN SODIUM 40 MG/0.4ML ~~LOC~~ SOLN
SUBCUTANEOUS | Status: AC
  Filled 2014-08-02: qty 0.4

## 2014-08-02 MED ORDER — ASPIRIN EC 81 MG PO TBEC
DELAYED_RELEASE_TABLET | ORAL | Status: AC
  Filled 2014-08-02: qty 1

## 2014-08-02 MED ORDER — VANCOMYCIN HCL IN DEXTROSE 1-5 GM/200ML-% IV SOLN: 1000.0000 mg | INTRAVENOUS | Status: DC

## 2014-08-02 MED ORDER — DM-GUAIFENESIN ER 30-600 MG PO TB12
1.0000 | ORAL_TABLET | Freq: Two times a day (BID) | ORAL | Status: DC
  Administered 2014-08-02 – 2014-08-07 (×11): 1 via ORAL
  Filled 2014-08-02 (×13): qty 1

## 2014-08-02 MED ORDER — ENSURE PUDDING PO PUDG
1.0000 | Freq: Three times a day (TID) | ORAL | Status: DC
  Administered 2014-08-02 – 2014-08-07 (×11): 1 via ORAL
  Filled 2014-08-02 (×18): qty 1

## 2014-08-02 NOTE — Progress Notes (Signed)
ANTIBIOTIC CONSULT NOTE  Pharmacy Consult for Zosyn and Vancomycin Indication: pneumonia  No Known Allergies  Patient Measurements: Height: 5\' 8"  (172.7 cm) Weight: 151 lb 0.2 oz (68.5 kg) IBW/kg (Calculated) : 68.4  Assessment: [name] admitted 12/3 from home with cough, ShOB, generalized malaise x a few weeks PTA. PMH includes hypertension, esophageal stricture, dysphagia, diastolic dysfunction. Pharmacy is consulted to dose Zosyn and Vancomycin for pneumonia, patient also on azithromycin per MD for atypical organism coverage.   Antiinfectives 12/4 >> Zosyn >> 12/4 >> vancomycin >>  12/4 >> azithromycin >>  Labs / vitals Tmax: 99.1 WBCs: elevated but improving Renal: SCr 1.46, improved (baseline appears 1.1-1.2), CrCl 35 CG/N  Microbiology 12/4 blood: ngtd 12/4 S pneumo Ur Ag: negative 12/4 Legionella Ur Ag: IP 12/4 Flu panel: none detected  Drug level / dose changes info: 12/4: d/t worsening renal function, decrease vancomycin to 750mg  IV Q24H 12/5: increase vancomycin to 1g q24h for improved renal funciton  Goal of Therapy:  Vancomycin trough level 15-20 mcg/ml  Appropriate antibiotic dosing for renal function; eradication of infection  Plan:  - continue Zosyn 3.375G IV q8h, each dose to be infused over 4 hours - increase vancomycin to 1g IV q24h to start today at 2000 - vancomycin trough at steady state if indicated - follow-up clinical course, culture results, renal function - follow-up antibiotic de-escalation and length of therapy  Thank you for the consult.  Currie Paris, PharmD, BCPS Pager: [phone] Pharmacy: [phone] 08/02/2014 11:59 AM

## 2014-08-02 NOTE — Progress Notes (Addendum)
[facility] Progress Note   [name]:[id number] DOB: 1924/12/02 DOA: 07/31/2014 PCP: [name], MD  Brief narrative: [name] is a 80 y.o. male admitted with cough and dyspnea.    Subjective: Cough is improving. Despite needing higher FiO2, he does not have any dyspnea.   Assessment/Plan: Principal Problem:  CAP (community acquired pneumonia)/ Sepsis/ Acute resp failure / Lactic acidosis - cont Vanc, Zosyn, Zithromax - cultures negative- d/c Vanc today - Influenza negative- will obtain U strep and Legionella- specimen yet to be sent - CXR now revealing Left greater than right bibasilar opacities.  - now requiring 100 % FiO2, not in any distress and leukocytosis and fever curve improving - add flutter valve, Mucinex and IS - cont to follow in SDU  Active Problems:   H/o Dysphagia - monitor for aspiration- not on a specialized diet at home   Atrial fibrillation with RVR- new diagnosis - has converted to NSR- stopped cardizem infusion as he was becoming bradycardic - obtain ECHO -  TFTs normal   Diastolic dysfunction - Grade 1- cont IVF - slow hydration- no signs of pulm edema   Essential hypertension - Hypotensive- hold HCTZ Lisinoprol and Amlodipine  Hyponatremia - due to HCTZ and possibly dehydration? - cont IVF  ARF - due to hypotension/ ACE I-  - Granular casts on UA suggestive of ATN  -slightly improved today    Code Status: DNR Family Communication: wife  Disposition Plan: to be determined DVT prophylaxis: Lovenox  Consultants: none  Procedures: none  Antibiotics: Anti-infectives    Start     Dose/Rate Route Frequency Ordered Stop   08/02/14 2000  vancomycin (VANCOCIN) IVPB 1000 mg/200 mL premix     1,000 mg200 mL/hr over 60 Minutes Intravenous Every 24 hours 08/02/14 1159     08/02/14 0230  vancomycin (VANCOCIN) IVPB 750 mg/150 ml premix  Status:  Discontinued     750 mg150 mL/hr over 60 Minutes Intravenous  Every 24 hours 08/01/14 0811 08/02/14 1159   08/01/14 1600  vancomycin (VANCOCIN) 500 mg in sodium chloride 0.9 % 100 mL IVPB  Status:  Discontinued     500 mg100 mL/hr over 60 Minutes Intravenous Every 12 hours 08/01/14 0224 08/01/14 0811   08/01/14 0900  azithromycin (ZITHROMAX) 500 mg in dextrose 5 % 250 mL IVPB     500 mg250 mL/hr over 60 Minutes Intravenous Every 24 hours 08/01/14 0800     08/01/14 0800  piperacillin-tazobactam (ZOSYN) IVPB 3.375 g     3.375 g12.5 mL/hr over 240 Minutes Intravenous Every 8 hours 08/01/14 0012     08/01/14 0230  vancomycin (VANCOCIN) IVPB 1000 mg/200 mL premix     1,000 mg200 mL/hr over 60 Minutes Intravenous  Once 08/01/14 0224 08/01/14 0338   07/31/14 2344  vancomycin (VANCOCIN) 1 GM/200ML IVPB    Comments:  Teup, Maria   : cabinet override      07/31/14 2344 08/01/14 1144   07/31/14 2343  piperacillin-tazobactam (ZOSYN) 3.375 GM/50ML IVPB    Comments:  Teup, Maria   : cabinet override      07/31/14 2343 08/01/14 1144   07/31/14 2330  vancomycin (VANCOCIN) IVPB 1000 mg/200 mL premix  Status:  Discontinued     1,000 mg200 mL/hr over 60 Minutes Intravenous  Once 07/31/14 2321 08/01/14 0224   07/31/14 2330  piperacillin-tazobactam (ZOSYN) IVPB 3.375 g     3.375 g12.5 mL/hr over 240 Minutes Intravenous  Once 07/31/14 2321 08/01/14 0358  Objective: Filed Weights   08/01/14 0049 08/01/14 0400 08/02/14 0412  Weight: 65 kg (143 lb 4.8 oz) 65 kg (143 lb 4.8 oz) 68.5 kg (151 lb 0.2 oz)    Intake/Output Summary (Last 24 hours) at 08/02/14 1229 Last data filed at 08/02/14 1227  Gross per 24 hour  Intake   1675 ml  Output   1090 ml  Net    585 ml     Vitals Filed Vitals:   08/02/14 0800 08/02/14 0921 08/02/14 0925 08/02/14 1112  BP: 136/60     Pulse: 96     Temp:    98.2 F (36.8 C)  TempSrc:    Oral  Resp: 28     Height:      Weight:      SpO2: 89% 82% 89%     Exam: General: AAO x3,  No acute respiratory distress Lungs: b/l  coarse breath sounds and rhonci- 100% FiO2, pulse ox 100% Cardiovascular: Regular rate and rhythm without murmur gallop or rub normal S1 and S2 Abdomen: Nontender, nondistended, soft, bowel sounds positive, no rebound, no ascites, no appreciable mass Extremities: No significant cyanosis, clubbing, or edema bilateral lower extremities  Data Reviewed: Basic Metabolic Panel:  Recent Labs Lab 07/31/14 2154 08/01/14 0700 08/02/14 0407  NA 135* 134* 134*  K 4.6 4.3 4.7  CL 92* 94* 96  CO2 23 27 29   GLUCOSE 191* 115* 113*  BUN 18 25* 25*  CREATININE 1.04 1.63* 1.46*  CALCIUM 9.5 8.4 8.5   Liver Function Tests:  Recent Labs Lab 07/31/14 2154 08/01/14 0700  AST 28 22  ALT 17 16  ALKPHOS 106 76  BILITOT 0.3 0.5  PROT 8.2 6.3  ALBUMIN 3.9 2.8*   No results for input(s): LIPASE, AMYLASE in the last 168 hours. No results for input(s): AMMONIA in the last 168 hours. CBC:  Recent Labs Lab 07/31/14 2154 08/01/14 0700 08/02/14 0407  WBC 21.9* 20.5* 15.4*  NEUTROABS 18.9* 18.0*  --   HGB 13.5 11.5* 10.5*  HCT 42.1 35.9* 33.2*  MCV 85.1 84.7 86.2  PLT 333 PLATELET CLUMPS NOTED ON SMEAR, COUNT APPEARS ADEQUATE 238   Cardiac Enzymes:  Recent Labs Lab 07/31/14 2154  TROPONINI <0.30   BNP (last 3 results)  Recent Labs  12/17/13 0422 01/09/14 1250 07/31/14 2154  PROBNP 1657.0* 131.0* 657.5*   CBG: No results for input(s): GLUCAP in the last 168 hours.  Recent Results (from the past 240 hour(s))  Culture, blood (routine x 2) Call MD if unable to obtain prior to antibiotics being given     Status: None (Preliminary result)   Collection Time: 08/01/14 12:43 AM  Result Value Ref Range Status   Specimen Description BLOOD RIGHT HAND  Final   Special Requests BOTTLES DRAWN AEROBIC ONLY 3CC  Final   Culture  Setup Time   Final    08/01/2014 03:23 Performed at [facility]    Culture   Final           BLOOD CULTURE RECEIVED NO GROWTH TO DATE CULTURE WILL BE  HELD FOR 5 DAYS BEFORE ISSUING A FINAL NEGATIVE REPORT Performed at [facility]    Report Status PENDING  Incomplete  Culture, blood (routine x 2) Call MD if unable to obtain prior to antibiotics being given     Status: None (Preliminary result)   Collection Time: 08/01/14 12:43 AM  Result Value Ref Range Status   Specimen Description BLOOD LEFT FOREARM  Final  Special Requests BOTTLES DRAWN AEROBIC AND ANAEROBIC 5CC  Final   Culture  Setup Time   Final    08/01/2014 03:22 Performed at [facility]    Culture   Final           BLOOD CULTURE RECEIVED NO GROWTH TO DATE CULTURE WILL BE HELD FOR 5 DAYS BEFORE ISSUING A FINAL NEGATIVE REPORT Performed at [facility]    Report Status PENDING  Incomplete  MRSA PCR Screening     Status: None   Collection Time: 08/01/14  6:20 AM  Result Value Ref Range Status   MRSA by PCR NEGATIVE NEGATIVE Final    Comment:        The GeneXpert MRSA Assay (FDA approved for NASAL specimens only), is one component of a comprehensive MRSA colonization surveillance program. It is not intended to diagnose MRSA infection nor to guide or monitor treatment for MRSA infections.      Studies:  Recent x-ray studies have been reviewed in detail by the Attending Physician  Scheduled Meds:  Scheduled Meds: . aspirin EC      . aspirin EC  81 mg Oral Daily  . atorvastatin  10 mg Oral q1800  . azithromycin  500 mg Intravenous Q24H  . dextromethorphan-guaiFENesin  1 tablet Oral BID  . enoxaparin      . enoxaparin (LOVENOX) injection  40 mg Subcutaneous Q24H  . feeding supplement (ENSURE)  1 Container Oral TID BM  . piperacillin-tazobactam (ZOSYN)  IV  3.375 g Intravenous Q8H  . vancomycin  1,000 mg Intravenous Q24H   Continuous Infusions: . sodium chloride 75 mL/hr at 08/01/14 1731    Time spent on care of this patient: 35 min   [place], MD 08/02/2014, 12:29 PM  LOS: 2 days   [facility] Office   [phone] Pager - Text Page per [email or web address]  If 7PM-7AM, please contact night-coverage [email or web address]

## 2014-08-02 NOTE — Plan of Care (Signed)
Problem: Phase I Progression Outcomes Goal: Dyspnea controlled at rest Outcome: Completed/Met Date Met:  08/02/14 Goal: Flu/PneumoVaccines if indicated Outcome: Not Applicable Date Met:  11/16/20 Goal: Pain controlled Outcome: Completed/Met Date Met:  08/02/14 Goal: Progress activity as tolerated unless otherwise ordered Outcome: Progressing Goal: Tolerating diet Outcome: Completed/Met Date Met:  08/02/14

## 2014-08-02 NOTE — Progress Notes (Signed)
Patient congested attempted to wean patient off of 40% venti-mask and back to nasal canal which was unsuccessful. Patient oxygen level dropped to 84% replaced venti-mask at 40 % oxygen level came up to 87% .  Increased oxygen level 55% at 14 liters  oxygen level stayed around 89%. Notified RT whom place on partial NRB. Will continue to monitor and notify MD on rounds. Patient resting comfortable breathing 21 breaths per minute.

## 2014-08-02 NOTE — Telephone Encounter (Signed)
Called and LMOVM on cell.  Was calling to let him know that I was thinking about him, since he was admitted.

## 2014-08-03 LAB — CBC
HEMATOCRIT: 35.4 % — AB (ref 39.0–52.0)
Hemoglobin: 11 g/dL — ABNORMAL LOW (ref 13.0–17.0)
MCH: 26.8 pg (ref 26.0–34.0)
MCHC: 31.1 g/dL (ref 30.0–36.0)
MCV: 86.3 fL (ref 78.0–100.0)
PLATELETS: 240 10*3/uL (ref 150–400)
RBC: 4.1 MIL/uL — AB (ref 4.22–5.81)
RDW: 14.8 % (ref 11.5–15.5)
WBC: 16.6 10*3/uL — AB (ref 4.0–10.5)

## 2014-08-03 MED ORDER — ALBUTEROL SULFATE (2.5 MG/3ML) 0.083% IN NEBU: 2.5000 mg | INHALATION_SOLUTION | RESPIRATORY_TRACT | Status: DC | PRN

## 2014-08-03 MED ORDER — HYDRALAZINE HCL 20 MG/ML IJ SOLN: 10.0000 mg | Freq: Four times a day (QID) | INTRAMUSCULAR | Status: DC | PRN

## 2014-08-03 MED ORDER — IPRATROPIUM-ALBUTEROL 0.5-2.5 (3) MG/3ML IN SOLN
3.0000 mL | Freq: Four times a day (QID) | RESPIRATORY_TRACT | Status: DC
  Administered 2014-08-03 (×3): 3 mL via RESPIRATORY_TRACT
  Filled 2014-08-03 (×3): qty 3

## 2014-08-03 NOTE — Progress Notes (Signed)
Report called to Abigail Butts, RN on 4W. Pt and wife aware of transfer to room 1423 and agreeable. Pt transferred to 1423 by NT x2 in stable condition at this time. Pt transferred on 4lnc and portable tele monitor.

## 2014-08-03 NOTE — Progress Notes (Signed)
[facility] Progress Note   [name] OBS:[id number] DOB: 1925-01-16 DOA: 07/31/2014 PCP: [name], MD  Brief narrative: [name] is a 80 y.o. male admitted with cough and dyspnea.    Subjective: Continues to have mild-mod cough. No dyspnea. No new complaints. Has a good appetite and is drinking the Ensure as well.   Assessment/Plan: Principal Problem:  CAP (community acquired pneumonia)/ Sepsis/ Acute resp failure / Lactic acidosis - cont Vanc, Zosyn, Zithromax - cultures negative- d/c'd Vanc 12/5-clinically improved although WBC count slightly higher today--fever curve improving - Influenza negative- U strep negative and Legionella pending- Resp panel pending - CXR 12/5 revealing Left greater than right bibasilar opacities.  - wheezing today- start Nebs  - added flutter valve, Mucinex and IS- begin PT - cont to follow in SDU  Active Problems:   H/o Dysphagia - no clinical signs of aspiration - will ask for SLP eval for MBS since this is his second pneumonia in the past year- previously was discharged on a Pureed diet but has not been taking it at home- per wife, she has not noted any aspiration- h/o esophageal stricture in the past- plan was to have a repeat EGD after recovering from Pneumonia in the spring but this was never done.   Atrial fibrillation with RVR- new diagnosis - has converted to NSR- stopped cardizem infusion as he was becoming bradycardic - ECHO still pending -  TFTs normal   Diastolic dysfunction - Grade 1- cont IVF - stop hydration today as PO intake and urine output is good- no signs of pulm edema   Essential hypertension - Hypotensive- hold HCTZ Lisinoprol and Amlodipine  Hyponatremia - due to HCTZ and possibly dehydration? - cont IVF  ARF - due to hypotension/ ACE I-  - Granular casts on UA suggestive of ATN  -slightly improved today  HTN - cont to hold Lisinopril/ HCTZ- IV Hydralazine PRN    Code Status:  DNR Family Communication: wife  Disposition Plan: to be determined DVT prophylaxis: Lovenox  Consultants: none  Procedures: none  Antibiotics: Anti-infectives    Start     Dose/Rate Route Frequency Ordered Stop   08/02/14 2000  vancomycin (VANCOCIN) IVPB 1000 mg/200 mL premix  Status:  Discontinued     1,000 mg200 mL/hr over 60 Minutes Intravenous Every 24 hours 08/02/14 1159 08/02/14 1235   08/02/14 0230  vancomycin (VANCOCIN) IVPB 750 mg/150 ml premix  Status:  Discontinued     750 mg150 mL/hr over 60 Minutes Intravenous Every 24 hours 08/01/14 0811 08/02/14 1159   08/01/14 1600  vancomycin (VANCOCIN) 500 mg in sodium chloride 0.9 % 100 mL IVPB  Status:  Discontinued     500 mg100 mL/hr over 60 Minutes Intravenous Every 12 hours 08/01/14 0224 08/01/14 0811   08/01/14 0900  azithromycin (ZITHROMAX) 500 mg in dextrose 5 % 250 mL IVPB     500 mg250 mL/hr over 60 Minutes Intravenous Every 24 hours 08/01/14 0800     08/01/14 0800  piperacillin-tazobactam (ZOSYN) IVPB 3.375 g     3.375 g12.5 mL/hr over 240 Minutes Intravenous Every 8 hours 08/01/14 0012     08/01/14 0230  vancomycin (VANCOCIN) IVPB 1000 mg/200 mL premix     1,000 mg200 mL/hr over 60 Minutes Intravenous  Once 08/01/14 0224 08/01/14 0338   07/31/14 2344  vancomycin (VANCOCIN) 1 GM/200ML IVPB    Comments:  Deon Pilling   : cabinet override      07/31/14 2344 08/01/14 1144  07/31/14 2343  piperacillin-tazobactam (ZOSYN) 3.375 GM/50ML IVPB    Comments:  Teup, Maria   : cabinet override      07/31/14 2343 08/01/14 1144   07/31/14 2330  vancomycin (VANCOCIN) IVPB 1000 mg/200 mL premix  Status:  Discontinued     1,000 mg200 mL/hr over 60 Minutes Intravenous  Once 07/31/14 2321 08/01/14 0224   07/31/14 2330  piperacillin-tazobactam (ZOSYN) IVPB 3.375 g     3.375 g12.5 mL/hr over 240 Minutes Intravenous  Once 07/31/14 2321 08/01/14 0358         Objective: Filed Weights   08/01/14 0400 08/02/14 0412 08/03/14 0344   Weight: 65 kg (143 lb 4.8 oz) 68.5 kg (151 lb 0.2 oz) 69.6 kg (153 lb 7 oz)    Intake/Output Summary (Last 24 hours) at 08/03/14 0914 Last data filed at 08/03/14 9892  Gross per 24 hour  Intake   2440 ml  Output   1150 ml  Net   1290 ml     Vitals Filed Vitals:   08/03/14 0400 08/03/14 0600 08/03/14 0800 08/03/14 0848  BP:  156/71 174/56   Pulse:  83 86   Temp: 99.1 F (37.3 C)  98.6 F (37 C)   TempSrc: Oral     Resp:  19 27   Height:      Weight:      SpO2:  95% 98% 91%    Exam: General: AAO x3,  No acute respiratory distress Lungs: mild to mod b/l wheeze today- pulse ox 95% on 4 L Cardiovascular: Regular rate and rhythm without murmur gallop or rub normal S1 and S2 Abdomen: Nontender, nondistended, soft, bowel sounds positive, no rebound, no ascites, no appreciable mass Extremities: No significant cyanosis, clubbing, or edema bilateral lower extremities  Data Reviewed: Basic Metabolic Panel:  Recent Labs Lab 07/31/14 2154 08/01/14 0700 08/02/14 0407 08/02/14 1300  NA 135* 134* 134* 133*  K 4.6 4.3 4.7 4.6  CL 92* 94* 96 95*  CO2 23 27 29 31   GLUCOSE 191* 115* 113* 91  BUN 18 25* 25* 20  CREATININE 1.04 1.63* 1.46* 1.30  CALCIUM 9.5 8.4 8.5 8.6   Liver Function Tests:  Recent Labs Lab 07/31/14 2154 08/01/14 0700  AST 28 22  ALT 17 16  ALKPHOS 106 76  BILITOT 0.3 0.5  PROT 8.2 6.3  ALBUMIN 3.9 2.8*   No results for input(s): LIPASE, AMYLASE in the last 168 hours. No results for input(s): AMMONIA in the last 168 hours. CBC:  Recent Labs Lab 07/31/14 2154 08/01/14 0700 08/02/14 0407 08/03/14 0400  WBC 21.9* 20.5* 15.4* 16.6*  NEUTROABS 18.9* 18.0*  --   --   HGB 13.5 11.5* 10.5* 11.0*  HCT 42.1 35.9* 33.2* 35.4*  MCV 85.1 84.7 86.2 86.3  PLT 333 PLATELET CLUMPS NOTED ON SMEAR, COUNT APPEARS ADEQUATE 238 240   Cardiac Enzymes:  Recent Labs Lab 07/31/14 2154  TROPONINI <0.30   BNP (last 3 results)  Recent Labs  12/17/13 0422  01/09/14 1250 07/31/14 2154  PROBNP 1657.0* 131.0* 657.5*   CBG: No results for input(s): GLUCAP in the last 168 hours.  Recent Results (from the past 240 hour(s))  Culture, blood (routine x 2) Call MD if unable to obtain prior to antibiotics being given     Status: None (Preliminary result)   Collection Time: 08/01/14 12:43 AM  Result Value Ref Range Status   Specimen Description BLOOD RIGHT HAND  Final   Special Requests BOTTLES DRAWN AEROBIC ONLY  3CC  Final   Culture  Setup Time   Final    08/01/2014 03:23 Performed at [facility]    Culture   Final           BLOOD CULTURE RECEIVED NO GROWTH TO DATE CULTURE WILL BE HELD FOR 5 DAYS BEFORE ISSUING A FINAL NEGATIVE REPORT Performed at [facility]    Report Status PENDING  Incomplete  Culture, blood (routine x 2) Call MD if unable to obtain prior to antibiotics being given     Status: None (Preliminary result)   Collection Time: 08/01/14 12:43 AM  Result Value Ref Range Status   Specimen Description BLOOD LEFT FOREARM  Final   Special Requests BOTTLES DRAWN AEROBIC AND ANAEROBIC 5CC  Final   Culture  Setup Time   Final    08/01/2014 03:22 Performed at [facility]    Culture   Final           BLOOD CULTURE RECEIVED NO GROWTH TO DATE CULTURE WILL BE HELD FOR 5 DAYS BEFORE ISSUING A FINAL NEGATIVE REPORT Performed at [facility]    Report Status PENDING  Incomplete  MRSA PCR Screening     Status: None   Collection Time: 08/01/14  6:20 AM  Result Value Ref Range Status   MRSA by PCR NEGATIVE NEGATIVE Final    Comment:        The GeneXpert MRSA Assay (FDA approved for NASAL specimens only), is one component of a comprehensive MRSA colonization surveillance program. It is not intended to diagnose MRSA infection nor to guide or monitor treatment for MRSA infections.   Culture, sputum-assessment     Status: None   Collection Time: 08/02/14  7:30 PM  Result Value Ref Range Status    Specimen Description SPUTUM  Final   Special Requests NONE  Final   Sputum evaluation   Final    THIS SPECIMEN IS ACCEPTABLE. RESPIRATORY CULTURE REPORT TO FOLLOW.   Report Status 08/02/2014 FINAL  Final     Studies:  Recent x-ray studies have been reviewed in detail by the Attending Physician  Scheduled Meds:  Scheduled Meds: . aspirin EC  81 mg Oral Daily  . atorvastatin  10 mg Oral q1800  . azithromycin  500 mg Intravenous Q24H  . dextromethorphan-guaiFENesin  1 tablet Oral BID  . enoxaparin (LOVENOX) injection  40 mg Subcutaneous Q24H  . feeding supplement (ENSURE)  1 Container Oral TID BM  . ipratropium-albuterol  3 mL Nebulization Q6H  . piperacillin-tazobactam (ZOSYN)  IV  3.375 g Intravenous Q8H   Continuous Infusions: . sodium chloride Stopped (08/03/14 0811)    Time spent on care of this patient: 67 min   [place], MD 08/03/2014, 9:14 AM  LOS: 3 days   [facility] Office  [phone] Pager - Text Page per [email or web address]  If 7PM-7AM, please contact night-coverage [email or web address]

## 2014-08-03 NOTE — Progress Notes (Signed)
UR Completed.  [phone]  

## 2014-08-04 ENCOUNTER — Inpatient Hospital Stay (HOSPITAL_COMMUNITY): Payer: Medicare Other

## 2014-08-04 LAB — RESPIRATORY VIRUS PANEL
ADENOVIRUS: NOT DETECTED
Influenza A H1: NOT DETECTED
Influenza A H3: NOT DETECTED
Influenza A: NOT DETECTED
Influenza B: NOT DETECTED
Metapneumovirus: NOT DETECTED
PARAINFLUENZA 1 A: NOT DETECTED
PARAINFLUENZA 3 A: NOT DETECTED
Parainfluenza 2: NOT DETECTED
RHINOVIRUS: NOT DETECTED
Respiratory Syncytial Virus A: NOT DETECTED
Respiratory Syncytial Virus B: NOT DETECTED

## 2014-08-04 LAB — BASIC METABOLIC PANEL
Anion gap: 13 (ref 5–15)
BUN: 13 mg/dL (ref 6–23)
CO2: 30 mEq/L (ref 19–32)
CREATININE: 1.09 mg/dL (ref 0.50–1.35)
Calcium: 9 mg/dL (ref 8.4–10.5)
Chloride: 93 mEq/L — ABNORMAL LOW (ref 96–112)
GFR calc Af Amer: 68 mL/min — ABNORMAL LOW (ref >90)
GFR, EST NON AFRICAN AMERICAN: 59 mL/min — AB (ref >90)
GLUCOSE: 122 mg/dL — AB (ref 70–99)
Potassium: 3.9 mEq/L (ref 3.7–5.3)
Sodium: 136 mEq/L — ABNORMAL LOW (ref 137–147)

## 2014-08-04 LAB — CBC
HCT: 33 % — ABNORMAL LOW (ref 39.0–52.0)
Hemoglobin: 10.2 g/dL — ABNORMAL LOW (ref 13.0–17.0)
MCH: 26.8 pg (ref 26.0–34.0)
MCHC: 30.9 g/dL (ref 30.0–36.0)
MCV: 86.8 fL (ref 78.0–100.0)
Platelets: 226 10*3/uL (ref 150–400)
RBC: 3.8 MIL/uL — AB (ref 4.22–5.81)
RDW: 14.5 % (ref 11.5–15.5)
WBC: 11.4 10*3/uL — ABNORMAL HIGH (ref 4.0–10.5)

## 2014-08-04 LAB — LEGIONELLA ANTIGEN, URINE

## 2014-08-04 LAB — CG4 I-STAT (LACTIC ACID): Lactic Acid, Venous: 5.92 mmol/L — ABNORMAL HIGH (ref 0.5–2.2)

## 2014-08-04 MED ORDER — LISINOPRIL 20 MG PO TABS
20.0000 mg | ORAL_TABLET | Freq: Every day | ORAL | Status: DC
  Administered 2014-08-04 – 2014-08-07 (×4): 20 mg via ORAL
  Filled 2014-08-04 (×4): qty 1

## 2014-08-04 MED ORDER — AMLODIPINE BESYLATE 10 MG PO TABS
10.0000 mg | ORAL_TABLET | Freq: Every day | ORAL | Status: DC
  Administered 2014-08-04 – 2014-08-07 (×4): 10 mg via ORAL
  Filled 2014-08-04 (×4): qty 1

## 2014-08-04 MED ORDER — IPRATROPIUM-ALBUTEROL 0.5-2.5 (3) MG/3ML IN SOLN
3.0000 mL | Freq: Three times a day (TID) | RESPIRATORY_TRACT | Status: DC
  Administered 2014-08-04 – 2014-08-06 (×8): 3 mL via RESPIRATORY_TRACT
  Filled 2014-08-04 (×9): qty 3

## 2014-08-04 NOTE — Evaluation (Signed)
Physical Therapy Evaluation Patient Details Name: [name] MRN: [id number] DOB: 06-08-1925 Today's Date: 08/04/2014   History of Present Illness  80 y.o. male with Past medical history of hypertension, esophageal stricture, dysphagia, diastolic dysfunction admitted 07/31/14 for CAP (community acquired pneumonia).  Clinical Impression  Pt currently with functional limitations due to the deficits listed below (see PT Problem List).  Pt will benefit from skilled PT to increase their independence and safety with mobility to allow discharge to the venue listed below.  Pt ambulated good distance in hallway however remains on 4L O2 [name].  Pt's brother in law in room and reports his spouse and himself as well as his wife can assist pt upon return home.  Patient saturations at rest on 4L O2 [name] = 88% Patient Saturations on 4 Liters of oxygen while Ambulating = 92%     Follow Up Recommendations Home health PT;Supervision for mobility/OOB    Equipment Recommendations  None recommended by PT    Recommendations for Other Services       Precautions / Restrictions Precautions Precautions: Fall Precaution Comments: monitor sats      Mobility  Bed Mobility Overal bed mobility: Needs Assistance Bed Mobility: Supine to Sit;Sit to Supine     Supine to sit: Supervision Sit to supine: Supervision   General bed mobility comments: increased time and effort however no physical assist required  Transfers Overall transfer level: Needs assistance Equipment used: Rolling walker (2 wheeled) Transfers: Sit to/from Stand Sit to Stand: Min assist         General transfer comment: slight assist to steady with standing  Ambulation/Gait Ambulation/Gait assistance: Min assist Ambulation Distance (Feet): 200 Feet Assistive device: Rolling walker (2 wheeled) Gait Pattern/deviations: Step-through pattern;Trunk flexed     General Gait Details: decreased pace, pt reports fatigue and SOB upon  return to room, SpO2 92% on 4L O2 during gait  Stairs            Wheelchair Mobility    Modified Rankin (Stroke Patients Only)       Balance                                             Pertinent Vitals/Pain Pain Assessment: No/denies pain    Home Living Family/patient expects to be discharged to:: Private residence Living Arrangements: Spouse/significant other Available Help at Discharge: Family Type of Home: House Home Access: Stairs to enter Entrance Stairs-Rails: Right Entrance Stairs-Number of Steps: 4 Home Layout: One level Home Equipment: Environmental consultant - 2 wheels;Cane - single point      Prior Function Level of Independence: Independent with assistive device(s)         Comments: states usually uses a cane for mobility, RW at times     Hand Dominance        Extremity/Trunk Assessment               Lower Extremity Assessment: Generalized weakness         Communication   Communication: No difficulties  Cognition Arousal/Alertness: Awake/alert Behavior During Therapy: WFL for tasks assessed/performed Overall Cognitive Status: Within Functional Limits for tasks assessed                      General Comments      Exercises        Assessment/Plan    PT Assessment  PT Diagnosis Difficulty walking   PT Problem List    PT Treatment Interventions     PT Goals (Current goals can be found in the Care Plan section) Acute Rehab PT Goals PT Goal Formulation: With patient Time For Goal Achievement: 08/11/14 Potential to Achieve Goals: Good    Frequency     Barriers to discharge        Co-evaluation               End of Session Equipment Utilized During Treatment: Gait belt;Oxygen Activity Tolerance: Patient tolerated treatment well Patient left: with call bell/phone within reach;in bed Nurse Communication: Mobility status (RN into room after to give meds, requested she set bed alarm again)          Time: 9643-8381 PT Time Calculation (min) (ACUTE ONLY): 22 min   Charges:   PT Evaluation $Initial PT Evaluation Tier I: 1 Procedure PT Treatments $Gait Training: 8-22 mins   PT G Codes:          Tige Meas,KATHrine E 08/04/2014, 1:40 PM Carmelia Bake, PT, DPT 08/04/2014 Pager: [phone]

## 2014-08-04 NOTE — Progress Notes (Signed)
Assumed care of patient from prior RN at 01:00am.  I agree with previous assessment and will continue to monitor patient.  [place] Shark, Karma Ansley Cherie

## 2014-08-04 NOTE — Progress Notes (Signed)
[facility] Progress Note   [name] WFU:[id number] DOB: June 04, 1925 DOA: 07/31/2014 PCP: [name], MD  Brief narrative: [name] is a 80 y.o. male admitted with cough and dyspnea.    Subjective: No new complaints  Assessment/Plan: Principal Problem:  CAP (community acquired pneumonia)/ Sepsis/ Acute resp failure / Lactic acidosis - cont Zosyn, Zithromax - cultures negative- d/c'd Vanc 12/5-clinically improved -improvement in WBC count and fever curve  - Influenza negative- U strep negative  - Legionella pending- Resp panel pending - CXR 12/5 revealing Left greater than right bibasilar opacities.  - Less wheezing today- started Nebs  - added flutter valve, Mucinex and IS- begin PT - cont to follow in on Med-Surg. - Oxygen level dropping to 87% on room air at rest. Continue oxygen to keep pulse ox greater than 92%  Active Problems:   H/o Dysphagia - no clinical signs of aspiration - have asked for SLP eval for MBS since this is his second pneumonia in the past year- previously was discharged on a Pureed diet but has not been taking it at home- per wife, she has not noted any aspiration- h/o esophageal stricture in the past- plan was to have a repeat EGD after recovering from Pneumonia in the spring but this was never done.   Atrial fibrillation with RVR- new diagnosis - has converted to NSR- stopped cardizem infusion as he was becoming bradycardic - ECHO still pending -  TFTs normal   Diastolic dysfunction - Grade 1- cont IVF - stopped hydration as PO intake and urine output is good- no signs of pulm edema   Essential hypertension - Hypotensive- hold HCTZ due to ongoing hyponatremia -Resume Lisinoprol and Amlodipine  Hyponatremia - due to HCTZ and possibly dehydration? - Has been hydrated adequately and now is euvolemic - sodium has improved but still low  ARF - due to hypotension/ ACE I-  - Granular casts on UA suggestive of ATN   -Resolved- resume ACE inhibitor today     Code Status: DNR Family Communication: wife  Disposition Plan: to be determined DVT prophylaxis: Lovenox  Consultants: none  Procedures: none  Antibiotics: Anti-infectives    Start     Dose/Rate Route Frequency Ordered Stop   08/02/14 2000  vancomycin (VANCOCIN) IVPB 1000 mg/200 mL premix  Status:  Discontinued     1,000 mg200 mL/hr over 60 Minutes Intravenous Every 24 hours 08/02/14 1159 08/02/14 1235   08/02/14 0230  vancomycin (VANCOCIN) IVPB 750 mg/150 ml premix  Status:  Discontinued     750 mg150 mL/hr over 60 Minutes Intravenous Every 24 hours 08/01/14 0811 08/02/14 1159   08/01/14 1600  vancomycin (VANCOCIN) 500 mg in sodium chloride 0.9 % 100 mL IVPB  Status:  Discontinued     500 mg100 mL/hr over 60 Minutes Intravenous Every 12 hours 08/01/14 0224 08/01/14 0811   08/01/14 0900  azithromycin (ZITHROMAX) 500 mg in dextrose 5 % 250 mL IVPB     500 mg250 mL/hr over 60 Minutes Intravenous Every 24 hours 08/01/14 0800     08/01/14 0800  piperacillin-tazobactam (ZOSYN) IVPB 3.375 g     3.375 g12.5 mL/hr over 240 Minutes Intravenous Every 8 hours 08/01/14 0012     08/01/14 0230  vancomycin (VANCOCIN) IVPB 1000 mg/200 mL premix     1,000 mg200 mL/hr over 60 Minutes Intravenous  Once 08/01/14 0224 08/01/14 0338   07/31/14 2344  vancomycin (VANCOCIN) 1 GM/200ML IVPB    Comments:  Deon Pilling   :  cabinet override      07/31/14 2344 08/01/14 1144   07/31/14 2343  piperacillin-tazobactam (ZOSYN) 3.375 GM/50ML IVPB    Comments:  Teup, Maria   : cabinet override      07/31/14 2343 08/01/14 1144   07/31/14 2330  vancomycin (VANCOCIN) IVPB 1000 mg/200 mL premix  Status:  Discontinued     1,000 mg200 mL/hr over 60 Minutes Intravenous  Once 07/31/14 2321 08/01/14 0224   07/31/14 2330  piperacillin-tazobactam (ZOSYN) IVPB 3.375 g     3.375 g12.5 mL/hr over 240 Minutes Intravenous  Once 07/31/14 2321 08/01/14 0358          Objective: Filed Weights   08/03/14 0344 08/03/14 1003 08/04/14 0440  Weight: 69.6 kg (153 lb 7 oz) 68.1 kg (150 lb 2.1 oz) 68.2 kg (150 lb 5.7 oz)    Intake/Output Summary (Last 24 hours) at 08/04/14 0817 Last data filed at 08/04/14 0700  Gross per 24 hour  Intake 951.17 ml  Output    800 ml  Net 151.17 ml     Vitals Filed Vitals:   08/03/14 1541 08/03/14 2027 08/03/14 2115 08/04/14 0440  BP: 146/50  147/80 167/60  Pulse: 69  87 79  Temp: 97.9 F (36.6 C)  98.4 F (36.9 C) 98.5 F (36.9 C)  TempSrc:   Oral Oral  Resp: 18  20 20   Height:      Weight:    68.2 kg (150 lb 5.7 oz)  SpO2: 98% 96% 97% 95%    Exam: General: AAO x3,  No acute respiratory distress Lungs: mild to mod b/l wheeze today- pulse ox 95% on 4 L Cardiovascular: Regular rate and rhythm without murmur gallop or rub normal S1 and S2 Abdomen: Nontender, nondistended, soft, bowel sounds positive, no rebound, no ascites, no appreciable mass Extremities: No significant cyanosis, clubbing, or edema bilateral lower extremities  Data Reviewed: Basic Metabolic Panel:  Recent Labs Lab 07/31/14 2154 08/01/14 0700 08/02/14 0407 08/02/14 1300 08/04/14 0416  NA 135* 134* 134* 133* 136*  K 4.6 4.3 4.7 4.6 3.9  CL 92* 94* 96 95* 93*  CO2 23 27 29 31 30   GLUCOSE 191* 115* 113* 91 122*  BUN 18 25* 25* 20 13  CREATININE 1.04 1.63* 1.46* 1.30 1.09  CALCIUM 9.5 8.4 8.5 8.6 9.0   Liver Function Tests:  Recent Labs Lab 07/31/14 2154 08/01/14 0700  AST 28 22  ALT 17 16  ALKPHOS 106 76  BILITOT 0.3 0.5  PROT 8.2 6.3  ALBUMIN 3.9 2.8*   No results for input(s): LIPASE, AMYLASE in the last 168 hours. No results for input(s): AMMONIA in the last 168 hours. CBC:  Recent Labs Lab 07/31/14 2154 08/01/14 0700 08/02/14 0407 08/03/14 0400 08/04/14 0416  WBC 21.9* 20.5* 15.4* 16.6* 11.4*  NEUTROABS 18.9* 18.0*  --   --   --   HGB 13.5 11.5* 10.5* 11.0* 10.2*  HCT 42.1 35.9* 33.2* 35.4* 33.0*   MCV 85.1 84.7 86.2 86.3 86.8  PLT 333 PLATELET CLUMPS NOTED ON SMEAR, COUNT APPEARS ADEQUATE 238 240 226   Cardiac Enzymes:  Recent Labs Lab 07/31/14 2154  TROPONINI <0.30   BNP (last 3 results)  Recent Labs  12/17/13 0422 01/09/14 1250 07/31/14 2154  PROBNP 1657.0* 131.0* 657.5*   CBG: No results for input(s): GLUCAP in the last 168 hours.  Recent Results (from the past 240 hour(s))  Culture, blood (routine x 2) Call MD if unable to obtain prior to antibiotics being given  Status: None (Preliminary result)   Collection Time: 08/01/14 12:43 AM  Result Value Ref Range Status   Specimen Description BLOOD RIGHT HAND  Final   Special Requests BOTTLES DRAWN AEROBIC ONLY 3CC  Final   Culture  Setup Time   Final    08/01/2014 03:23 Performed at [facility]    Culture   Final           BLOOD CULTURE RECEIVED NO GROWTH TO DATE CULTURE WILL BE HELD FOR 5 DAYS BEFORE ISSUING A FINAL NEGATIVE REPORT Performed at [facility]    Report Status PENDING  Incomplete  Culture, blood (routine x 2) Call MD if unable to obtain prior to antibiotics being given     Status: None (Preliminary result)   Collection Time: 08/01/14 12:43 AM  Result Value Ref Range Status   Specimen Description BLOOD LEFT FOREARM  Final   Special Requests BOTTLES DRAWN AEROBIC AND ANAEROBIC 5CC  Final   Culture  Setup Time   Final    08/01/2014 03:22 Performed at [facility]    Culture   Final           BLOOD CULTURE RECEIVED NO GROWTH TO DATE CULTURE WILL BE HELD FOR 5 DAYS BEFORE ISSUING A FINAL NEGATIVE REPORT Performed at [facility]    Report Status PENDING  Incomplete  MRSA PCR Screening     Status: None   Collection Time: 08/01/14  6:20 AM  Result Value Ref Range Status   MRSA by PCR NEGATIVE NEGATIVE Final    Comment:        The GeneXpert MRSA Assay (FDA approved for NASAL specimens only), is one component of a comprehensive MRSA colonization surveillance  program. It is not intended to diagnose MRSA infection nor to guide or monitor treatment for MRSA infections.   Culture, sputum-assessment     Status: None   Collection Time: 08/02/14  7:30 PM  Result Value Ref Range Status   Specimen Description SPUTUM  Final   Special Requests NONE  Final   Sputum evaluation   Final    THIS SPECIMEN IS ACCEPTABLE. RESPIRATORY CULTURE REPORT TO FOLLOW.   Report Status 08/02/2014 FINAL  Final     Studies:  Recent x-ray studies have been reviewed in detail by the Attending Physician  Scheduled Meds:  Scheduled Meds: . aspirin EC  81 mg Oral Daily  . atorvastatin  10 mg Oral q1800  . azithromycin  500 mg Intravenous Q24H  . dextromethorphan-guaiFENesin  1 tablet Oral BID  . enoxaparin (LOVENOX) injection  40 mg Subcutaneous Q24H  . feeding supplement (ENSURE)  1 Container Oral TID BM  . ipratropium-albuterol  3 mL Nebulization TID  . piperacillin-tazobactam (ZOSYN)  IV  3.375 g Intravenous Q8H   Continuous Infusions: . sodium chloride Stopped (08/03/14 0811)    Time spent on care of this patient: 63 min   [place], MD 08/04/2014, 8:17 AM  LOS: 4 days   [facility] Office  [phone] Pager - Text Page per [email or web address]  If 7PM-7AM, please contact night-coverage [email or web address]

## 2014-08-04 NOTE — Evaluation (Signed)
Clinical/Bedside Swallow Evaluation Patient Details  Name: [name] MRN: [id number] Date of Birth: 1924-09-07  Today's Date: 08/04/2014 Time: 0850-0921 SLP Time Calculation (min) (ACUTE ONLY): 31 min  Past Medical History:  Past Medical History  Diagnosis Date  . Hypertension   . Hyperlipidemia   . Tobacco abuse   . Lung nodule     Left lung, seen 2012, no change in 2012, no follow up needed   . PNA (pneumonia) 2010    Bilateral Pneumonia, COPD 46mm LLL Nodule   . History of ETT 09/1991     POS   . History of MRI 04-22-06    L/S- right hnp  l5/si   . B12 deficiency   . TIA (transient ischemic attack)   . GERD (gastroesophageal reflux disease)   . Esophageal stricture   . Hemorrhoids   . Colon polyps     hyperplastic   Past Surgical History:  Past Surgical History  Procedure Laterality Date  . Cystectomy  1995    Lumbar area    HPI:  80 yo male adm to [facility] with CAP.  Pt with PMH + esophagitis and 14 mm ring diagnosed per EGD 09/2011, HTN, new diagnosis of Afib.  CXR 12/6 showed Left greater than right bibasilar opacities.  Pt with admission in April 2015 and complaint of regurgitation of food/drink.  He was scheduled to have an EGD in 11/2013 but it was not completed.  Pt admits to occasional issues with choking on food more than drink - stating sometimes he does not get it chewed adequately.  Pt now states regurgitation issues in April 2015 were mucus and not food coming up from esophagus.     Assessment / Plan / Recommendation Clinical Impression  Suspect multifactorial dysphagia with mild oropharyngeal component and suspected primary esophageal issues.  Pt is mildly dysarthric, ? with decreased left facial movement compared to right and noted to lean left during meal observation.  He tends to take very large bites and use liquids to clear solids.  Cough x2 noted after liquids as pt attempted to "wash" food from mouth into pharynx.  Small single boluses tolerated better.   Pt did appear to become mildly dyspneic during meal but he denied this issue.    Approximately half way through meal, pt reported "Now I can't swallow these eggs" with appearance of effortful swallow after which he regurgitated eggs with mucus.    Educated pt to recommendations to take small bites/sips, consume liquids t/o meal and avoid mixed consistencies.      SLP will follow up to determine where SLP input may be helpful in mitigating dysphagia.  Pt denies dysphagia except "occasional having to cough up food" and states Dr Henrene Pastor stated he did not need an EGD.    Recommend consider dedicated esophageal evaluation due to pt's primary symptoms being consistent with deficits in this area.    Aspiration Risk  Moderate    Diet Recommendation Regular;Thin liquid   Liquid Administration via: Cup;Straw Medication Administration: Whole meds with liquid Supervision: Patient able to self feed Compensations: Slow rate;Small sips/bites (consume liquids t/o meal) Postural Changes and/or Swallow Maneuvers: Seated upright 90 degrees;Upright 30-60 min after meal    Other  Recommendations Recommended Consults: Consider esophageal assessment Oral Care Recommendations: Oral care BID   Follow Up Recommendations   (tbd)    Frequency and Duration min 2x/week  2 weeks   Pertinent Vitals/Pain Afebrile, decreased     Swallow Study Prior Functional Status  see [place] Date of Onset: 08/04/14 HPI: 80 yo male adm to [facility] with CAP.  Pt with PMH + esophagitis and 14 mm ring diagnosed per EGD 09/2011, HTN, new diagnosis of Afib.  CXR 12/6 showed Left greater than right bibasilar opacities.  Pt with admission in April 2015 and complaint of regurgitation of food/drink.  He was scheduled to have an EGD in 11/2013 but it was not completed.  Pt admits to occasional issues with choking on food more than drink - stating sometimes he does not get it chewed adequately.  Pt now states regurgitation issues in  April 2015 were mucus and not food coming up from esophagus.   Type of Study: Bedside swallow evaluation Diet Prior to this Study: Regular;Thin liquids Temperature Spikes Noted: No Respiratory Status: Nasal cannula History of Recent Intubation: No Behavior/Cognition: Alert;Cooperative;Pleasant mood Oral Cavity - Dentition: Dentures, top;Dentures, bottom Self-Feeding Abilities: Able to feed self Patient Positioning: Upright in bed Baseline Vocal Quality: Clear Volitional Cough: Strong Volitional Swallow: Able to elicit    Oral/Motor/Sensory Function Overall Oral Motor/Sensory Function: Appears within functional limits for tasks assessed Facial ROM: Reduced left (?)   Ice Chips Ice chips: Not tested   Thin Liquid Thin Liquid: Impaired Presentation: Straw Pharyngeal  Phase Impairments: Cough - Immediate Other Comments: cough x2 of approx 8 swallows when using liquid to orally transit solids    Nectar Thick Nectar Thick Liquid: Not tested   Honey Thick Honey Thick Liquid: Not tested   Puree Puree: Not tested   Solid   GO    Solid: Within functional limits Presentation: Self Fed;Spoon Other Comments: pt takes large bites- cues to decrease size helpful       Luanna Salk, [facility] SLP [phone]

## 2014-08-05 DIAGNOSIS — K219 Gastro-esophageal reflux disease without esophagitis: Secondary | ICD-10-CM

## 2014-08-05 LAB — CBC
HCT: 32.1 % — ABNORMAL LOW (ref 39.0–52.0)
HEMOGLOBIN: 10.2 g/dL — AB (ref 13.0–17.0)
MCH: 26.6 pg (ref 26.0–34.0)
MCHC: 31.8 g/dL (ref 30.0–36.0)
MCV: 83.6 fL (ref 78.0–100.0)
Platelets: 273 10*3/uL (ref 150–400)
RBC: 3.84 MIL/uL — ABNORMAL LOW (ref 4.22–5.81)
RDW: 14.2 % (ref 11.5–15.5)
WBC: 8.5 10*3/uL (ref 4.0–10.5)

## 2014-08-05 LAB — BASIC METABOLIC PANEL
Anion gap: 9 (ref 5–15)
BUN: 11 mg/dL (ref 6–23)
CHLORIDE: 91 meq/L — AB (ref 96–112)
CO2: 32 mEq/L (ref 19–32)
CREATININE: 1.16 mg/dL (ref 0.50–1.35)
Calcium: 9.2 mg/dL (ref 8.4–10.5)
GFR calc Af Amer: 63 mL/min — ABNORMAL LOW (ref >90)
GFR calc non Af Amer: 55 mL/min — ABNORMAL LOW (ref >90)
Glucose, Bld: 99 mg/dL (ref 70–99)
Potassium: 4.3 mEq/L (ref 3.7–5.3)
Sodium: 132 mEq/L — ABNORMAL LOW (ref 137–147)

## 2014-08-05 LAB — CULTURE, RESPIRATORY W GRAM STAIN

## 2014-08-05 LAB — CULTURE, RESPIRATORY

## 2014-08-05 MED ORDER — PANTOPRAZOLE SODIUM 40 MG PO TBEC
40.0000 mg | DELAYED_RELEASE_TABLET | Freq: Every day | ORAL | Status: DC
  Administered 2014-08-05 – 2014-08-07 (×3): 40 mg via ORAL
  Filled 2014-08-05 (×4): qty 1

## 2014-08-05 NOTE — Plan of Care (Signed)
Problem: Phase II Progression Outcomes Goal: Tolerating diet Outcome: Completed/Met Date Met:  08/05/14     

## 2014-08-05 NOTE — Progress Notes (Addendum)
ANTIBIOTIC CONSULT NOTE  Pharmacy Consult for Zosyn and Vancomycin Indication: pneumonia  No Known Allergies  Patient Measurements: Height: 5\' 6"  (167.6 cm) Weight: 145 lb 11.6 oz (66.1 kg) IBW/kg (Calculated) : 63.8  Assessment: 47 yoM admitted 12/3 from home with cough, SOB, generalized malaise x a few weeks PTA. PMH includes hypertension, esophageal stricture, dysphagia, diastolic dysfunction. Pharmacy is consulted to dose Zosyn and Vancomycin for pneumonia, patient also on azithromycin per MD for atypical organism coverage.   12/4 >> Zosyn >> 12/4 >> vancomycin >> 12/5 12/4 >> azithromycin >>  Tmax: afebrile since 12/4; 99.6 on 12/7 WBCs: elevated on admission, now returned to [facility] Renal function now returned to baseline (SCr 1.1-1.2) CrCl 41 CG  12/4 blood: ngtd 12/4 Flu panel: none detected 12/4 Resp virus panel: not detected 12/4 MRSA PCR: negative 12/5 S pneumo Ur Ag: negative 12/5 Legionella Ur Ag: IP 12/5 sputum: rare C albicans    Goal of Therapy:  Appropriate antibiotic dosing for renal function; eradication of infection   Plan:  - continue Zosyn 3.375G IV q8h, each dose to be infused over 4 hours.  Yeast unlikely to be primary causative agent of PNA w/o disseminated infection.  Would consider de-escalation of Zosyn to ceftriaxone in near future given clinical improvement - follow-up clinical course, culture results, renal function - follow-up antibiotic de-escalation and length of therapy  Thank you for the consult.  Reuel Boom, PharmD Pager: [phone] 08/05/2014, 10:21 AM

## 2014-08-05 NOTE — Plan of Care (Signed)
Problem: Phase I Progression Outcomes Goal: Hemodynamically stable Outcome: Progressing Goal: Progress activity as tolerated unless otherwise ordered Outcome: Progressing

## 2014-08-05 NOTE — Progress Notes (Signed)
[facility] Progress Note   [name] JZP:[id number] DOB: 1924-10-23 DOA: 07/31/2014 PCP: [name], MD  Brief narrative: [name] is a 80 y.o. male admitted with cough and dyspnea.    Subjective: No new complaints  Assessment/Plan: Principal Problem:  CAP (community acquired pneumonia)/ Sepsis/ Acute resp failure / Lactic acidosis - cont Zosyn, Zithromax - cultures negative- d/c'd Vanc 12/5-clinically improved -improvement in WBC count and fever curve  - Influenza, U strep,  Legionella negative    Resp panel neg - CXR 12/5 revealing Left greater than right bibasilar opacities.  - no wheezing currently- weaning O2 - added flutter valve, Mucinex and IS- ambulate - cont to follow in on Med-Surg.   Active Problems:   H/o Dysphagia - no clinical signs of aspiration  - per wife, she has not noted any aspiration- h/o esophageal stricture in the past- plan was to have a repeat EGD after recovering from Pneumonia in the spring but this was never done. - Esophagram reveals stricture with diameter of 1.1- will request GI consult- d/w patient and his wife   Atrial fibrillation with RVR- new diagnosis - has converted to NSR- stopped cardizem infusion as he was becoming bradycardic - ECHO still is pending -  TFTs normal   Diastolic dysfunction - Grade 1- cont IVF - stopped hydration as PO intake and urine output is good- no signs of pulm edema   Essential hypertension - Hypotensive- hold HCTZ due to ongoing hyponatremia -Resumed Lisinoprol and Amlodipine  Hyponatremia - due to HCTZ and possibly dehydration? - Has been hydrated adequately and now is euvolemic - sodium has improved but still low  ARF - due to hypotension/ ACE I-  - Granular casts on UA suggestive of ATN  -Resolved- resume ACE inhibitor     Code Status: DNR Family Communication: wife  Disposition Plan: to be determined DVT prophylaxis:  Lovenox  Consultants: none  Procedures: none  Antibiotics: Anti-infectives    Start     Dose/Rate Route Frequency Ordered Stop   08/02/14 2000  vancomycin (VANCOCIN) IVPB 1000 mg/200 mL premix  Status:  Discontinued     1,000 mg200 mL/hr over 60 Minutes Intravenous Every 24 hours 08/02/14 1159 08/02/14 1235   08/02/14 0230  vancomycin (VANCOCIN) IVPB 750 mg/150 ml premix  Status:  Discontinued     750 mg150 mL/hr over 60 Minutes Intravenous Every 24 hours 08/01/14 0811 08/02/14 1159   08/01/14 1600  vancomycin (VANCOCIN) 500 mg in sodium chloride 0.9 % 100 mL IVPB  Status:  Discontinued     500 mg100 mL/hr over 60 Minutes Intravenous Every 12 hours 08/01/14 0224 08/01/14 0811   08/01/14 0900  azithromycin (ZITHROMAX) 500 mg in dextrose 5 % 250 mL IVPB     500 mg250 mL/hr over 60 Minutes Intravenous Every 24 hours 08/01/14 0800     08/01/14 0800  piperacillin-tazobactam (ZOSYN) IVPB 3.375 g     3.375 g12.5 mL/hr over 240 Minutes Intravenous Every 8 hours 08/01/14 0012     08/01/14 0230  vancomycin (VANCOCIN) IVPB 1000 mg/200 mL premix     1,000 mg200 mL/hr over 60 Minutes Intravenous  Once 08/01/14 0224 08/01/14 0338   07/31/14 2344  vancomycin (VANCOCIN) 1 GM/200ML IVPB    Comments:  Deon Pilling   : cabinet override      07/31/14 2344 08/01/14 1144   07/31/14 2343  piperacillin-tazobactam (ZOSYN) 3.375 GM/50ML IVPB    Comments:  Deon Pilling   : cabinet override  07/31/14 2343 08/01/14 1144   07/31/14 2330  vancomycin (VANCOCIN) IVPB 1000 mg/200 mL premix  Status:  Discontinued     1,000 mg200 mL/hr over 60 Minutes Intravenous  Once 07/31/14 2321 08/01/14 0224   07/31/14 2330  piperacillin-tazobactam (ZOSYN) IVPB 3.375 g     3.375 g12.5 mL/hr over 240 Minutes Intravenous  Once 07/31/14 2321 08/01/14 0358         Objective: Filed Weights   08/03/14 1003 08/04/14 0440 08/05/14 0455  Weight: 68.1 kg (150 lb 2.1 oz) 68.2 kg (150 lb 5.7 oz) 66.1 kg (145 lb 11.6 oz)     Intake/Output Summary (Last 24 hours) at 08/05/14 1531 Last data filed at 08/05/14 1121  Gross per 24 hour  Intake    660 ml  Output   1925 ml  Net  -1265 ml     Vitals Filed Vitals:   08/05/14 0841 08/05/14 1004 08/05/14 1109 08/05/14 1511  BP:  135/73  144/62  Pulse:  83  80  Temp:  98 F (36.7 C)  98.2 F (36.8 C)  TempSrc:  Oral  Oral  Resp:  20  20  Height:      Weight:      SpO2: 95% 94% 93% 98%    Exam: General: AAO x3,  No acute respiratory distress Lungs: no wheeze,mild rhonchi, no crackles pulse ox 98% on 3 L Cardiovascular: Regular rate and rhythm without murmur gallop or rub normal S1 and S2 Abdomen: Nontender, nondistended, soft, bowel sounds positive, no rebound, no ascites, no appreciable mass Extremities: No significant cyanosis, clubbing, or edema bilateral lower extremities  Data Reviewed: Basic Metabolic Panel:  Recent Labs Lab 08/01/14 0700 08/02/14 0407 08/02/14 1300 08/04/14 0416 08/05/14 0433  NA 134* 134* 133* 136* 132*  K 4.3 4.7 4.6 3.9 4.3  CL 94* 96 95* 93* 91*  CO2 27 29 31 30  32  GLUCOSE 115* 113* 91 122* 99  BUN 25* 25* 20 13 11   CREATININE 1.63* 1.46* 1.30 1.09 1.16  CALCIUM 8.4 8.5 8.6 9.0 9.2   Liver Function Tests:  Recent Labs Lab 07/31/14 2154 08/01/14 0700  AST 28 22  ALT 17 16  ALKPHOS 106 76  BILITOT 0.3 0.5  PROT 8.2 6.3  ALBUMIN 3.9 2.8*   No results for input(s): LIPASE, AMYLASE in the last 168 hours. No results for input(s): AMMONIA in the last 168 hours. CBC:  Recent Labs Lab 07/31/14 2154 08/01/14 0700 08/02/14 0407 08/03/14 0400 08/04/14 0416 08/05/14 0433  WBC 21.9* 20.5* 15.4* 16.6* 11.4* 8.5  NEUTROABS 18.9* 18.0*  --   --   --   --   HGB 13.5 11.5* 10.5* 11.0* 10.2* 10.2*  HCT 42.1 35.9* 33.2* 35.4* 33.0* 32.1*  MCV 85.1 84.7 86.2 86.3 86.8 83.6  PLT 333 PLATELET CLUMPS NOTED ON SMEAR, COUNT APPEARS ADEQUATE 238 240 226 273   Cardiac Enzymes:  Recent Labs Lab 07/31/14 2154   TROPONINI <0.30   BNP (last 3 results)  Recent Labs  12/17/13 0422 01/09/14 1250 07/31/14 2154  PROBNP 1657.0* 131.0* 657.5*   CBG: No results for input(s): GLUCAP in the last 168 hours.  Recent Results (from the past 240 hour(s))  Culture, blood (routine x 2) Call MD if unable to obtain prior to antibiotics being given     Status: None (Preliminary result)   Collection Time: 08/01/14 12:43 AM  Result Value Ref Range Status   Specimen Description BLOOD RIGHT HAND  Final   Special Requests  BOTTLES DRAWN AEROBIC ONLY 3CC  Final   Culture  Setup Time   Final    08/01/2014 03:23 Performed at [facility]    Culture   Final           BLOOD CULTURE RECEIVED NO GROWTH TO DATE CULTURE WILL BE HELD FOR 5 DAYS BEFORE ISSUING A FINAL NEGATIVE REPORT Performed at [facility]    Report Status PENDING  Incomplete  Culture, blood (routine x 2) Call MD if unable to obtain prior to antibiotics being given     Status: None (Preliminary result)   Collection Time: 08/01/14 12:43 AM  Result Value Ref Range Status   Specimen Description BLOOD LEFT FOREARM  Final   Special Requests BOTTLES DRAWN AEROBIC AND ANAEROBIC 5CC  Final   Culture  Setup Time   Final    08/01/2014 03:22 Performed at [facility]    Culture   Final           BLOOD CULTURE RECEIVED NO GROWTH TO DATE CULTURE WILL BE HELD FOR 5 DAYS BEFORE ISSUING A FINAL NEGATIVE REPORT Performed at [facility]    Report Status PENDING  Incomplete  MRSA PCR Screening     Status: None   Collection Time: 08/01/14  6:20 AM  Result Value Ref Range Status   MRSA by PCR NEGATIVE NEGATIVE Final    Comment:        The GeneXpert MRSA Assay (FDA approved for NASAL specimens only), is one component of a comprehensive MRSA colonization surveillance program. It is not intended to diagnose MRSA infection nor to guide or monitor treatment for MRSA infections.   Respiratory virus panel (routine influenza)      Status: None   Collection Time: 08/01/14  8:40 PM  Result Value Ref Range Status   Source - RVPAN NOSE  Final   Respiratory Syncytial Virus A NOT DETECTED  Final   Respiratory Syncytial Virus B NOT DETECTED  Final   Influenza A NOT DETECTED  Final   Influenza B NOT DETECTED  Final   Parainfluenza 1 NOT DETECTED  Final   Parainfluenza 2 NOT DETECTED  Final   Parainfluenza 3 NOT DETECTED  Final   Metapneumovirus NOT DETECTED  Final   Rhinovirus NOT DETECTED  Final   Adenovirus NOT DETECTED  Final   Influenza A H1 NOT DETECTED  Final   Influenza A H3 NOT DETECTED  Final    Comment: (NOTE)       Normal Reference Range for each Analyte: NOT DETECTED Testing performed using the Luminex xTAG Respiratory Viral Panel test kit. The analytical performance characteristics of this assay have been determined by [facility].  The modifications have not been cleared or approved by the FDA. This assay has been validated pursuant to the CLIA regulations and is used for clinical purposes. Performed at [facility], sputum-assessment     Status: None   Collection Time: 08/02/14  7:30 PM  Result Value Ref Range Status   Specimen Description SPUTUM  Final   Special Requests NONE  Final   Sputum evaluation   Final    THIS SPECIMEN IS ACCEPTABLE. RESPIRATORY CULTURE REPORT TO FOLLOW.   Report Status 08/02/2014 FINAL  Final  Culture, respiratory (NON-Expectorated)     Status: None   Collection Time: 08/02/14  7:36 PM  Result Value Ref Range Status   Specimen Description SPUTUM  Final   Special Requests NONE  Final  Gram Stain   Final    MODERATE WBC PRESENT, PREDOMINANTLY PMN FEW SQUAMOUS EPITHELIAL CELLS PRESENT RARE YEAST Performed at [facility]    Culture   Final    FEW CANDIDA ALBICANS Performed at [facility]    Report Status 08/05/2014 FINAL  Final     Studies:  Recent x-ray studies have been reviewed in detail by the Attending  Physician  Scheduled Meds:  Scheduled Meds: . amLODipine  10 mg Oral Daily  . aspirin EC  81 mg Oral Daily  . atorvastatin  10 mg Oral q1800  . azithromycin  500 mg Intravenous Q24H  . dextromethorphan-guaiFENesin  1 tablet Oral BID  . enoxaparin (LOVENOX) injection  40 mg Subcutaneous Q24H  . feeding supplement (ENSURE)  1 Container Oral TID BM  . ipratropium-albuterol  3 mL Nebulization TID  . lisinopril  20 mg Oral Daily  . piperacillin-tazobactam (ZOSYN)  IV  3.375 g Intravenous Q8H   Continuous Infusions: . sodium chloride Stopped (08/03/14 0811)    Time spent on care of this patient: 21 min   [place], MD 08/05/2014, 3:31 PM  LOS: 5 days   [facility] Office  [phone] Pager - Text Page per [email or web address]  If 7PM-7AM, please contact night-coverage [email or web address]

## 2014-08-05 NOTE — Progress Notes (Signed)
SLP Cancellation Note  Patient Details Name: CORDARRYL MONRREAL MRN: [id number] DOB: Aug 28, 1925   Cancelled treatment:       Reason Eval/Treat Not Completed:  (pt sleeping and esophageal issues documented from barium study, GI on board.  )   Luanna Salk, [place] [facility] SLP [phone]

## 2014-08-05 NOTE — Plan of Care (Signed)
Problem: Phase II Progression Outcomes Goal: Pain controlled on oral analgesia Outcome: Completed/Met Date Met:  08/05/14     

## 2014-08-05 NOTE — Plan of Care (Signed)
Problem: ICU Phase Progression Outcomes Goal: Initial discharge plan identified Outcome: Completed/Met Date Met:  08/05/14

## 2014-08-05 NOTE — Plan of Care (Signed)
Problem: Phase I Progression Outcomes Goal: Hemodynamically stable Outcome: Completed/Met Date Met:  08/05/14 Goal: Progress activity as tolerated unless otherwise ordered Outcome: Completed/Met Date Met:  08/05/14 Goal: Discharge plan established Outcome: Completed/Met Date Met:  08/05/14 Goal: Other Phase II Outcomes/Goals Outcome: Not Applicable Date Met:  28/00/34

## 2014-08-05 NOTE — Consult Note (Signed)
Referring Provider: Dr Wynelle Cleveland Arlean Hopping Primary Care Physician:  [name], MD Primary Gastroenterologist:  Dr.Perry  Reason for Consultation:  Dysphagia, esophageal stricture  HPI: [name] is a 80 y.o. male known to Dr. Jenny Reichmann.Perry  history of GERD and previously documented distal esophageal stricture. Patient had undergone an EGD in 2013 with finding of a ringlike stricture that was dilated to 50 [place]. He had recurrence of dysphagia in early 2015 and was actually seen in the office mid April 2015 and scheduled for EGD with dilation. Unfortunately 2 days later he was admitted to the hospital with a pneumonia. He was seen in consult by GI and again scheduled for EGD with dilation. However on the day of procedure is O2 sat was 88 on 4 L and anesthesia canceled the procedure. He was to follow-up in the office. Patient admitted now on 07/31/2014 with complaints of cough and weakness and found to have a bibasilar pneumonia. He has been hypoxic and continues to require 3-4 L/m. He is being treated with Zosyn and her max. He was also in rapid atrial fib on admission. He has had complaints of intermittent dysphasia since admission. His wife states that he doesn't complain much at home but occasionally will have to stop eating and regurgitates. This is not occurring on a daily basis. Pt  denies regular heartburn or indigestion. No abdominal discomfort. He underwent barium swallow yesterday which showed a smooth stricture of the distal esophagus measuring about 11 mm, -13 mm barium tablet did not pass also noted to have nonspecific esophageal dysmotility. Patient states that he feels better than on admission but is still quite weak. His wife states she does not feel that these well enough to have any procedures as yet. Patient is being covered with Lovenox. Past Medical History  Diagnosis Date  . Hypertension   . Hyperlipidemia   . Tobacco abuse   . Lung nodule     Left lung, seen 2012,  no change in 2012, no follow up needed   . PNA (pneumonia) 2010    Bilateral Pneumonia, COPD 35mm LLL Nodule   . History of ETT 09/1991     POS   . History of MRI 04-22-06    L/S- right hnp  l5/si   . B12 deficiency   . TIA (transient ischemic attack)   . GERD (gastroesophageal reflux disease)   . Esophageal stricture   . Hemorrhoids   . Colon polyps     hyperplastic    Past Surgical History  Procedure Laterality Date  . Cystectomy  1995    Lumbar area     Prior to Admission medications   Medication Sig Start Date End Date Taking? Authorizing Provider  albuterol (PROVENTIL HFA;VENTOLIN HFA) 108 (90 BASE) MCG/ACT inhaler Inhale 2 puffs into the lungs every 6 (six) hours as needed for wheezing or shortness of breath. 12/20/13  Yes Costin Karlyne Greenspan, MD  aspirin EC 81 MG tablet Take 81 mg by mouth daily.   Yes Historical Provider, MD  fluorouracil (EFUDEX) 5 % cream Apply 1 application topically daily.  08/04/11  Yes Historical Provider, MD  hydrochlorothiazide (HYDRODIURIL) 25 MG tablet Take 1 tablet (25 mg total) by mouth daily. 07/22/14  Yes Tonia Ghent, MD  lisinopril (PRINIVIL,ZESTRIL) 20 MG tablet Take 1 tablet (20 mg total) by mouth daily. 07/22/14  Yes Tonia Ghent, MD  Multiple Vitamins-Minerals (PRESERVISION AREDS 2) CAPS Take as directed. 03/16/14  Yes Tonia Ghent, MD  simvastatin (ZOCOR)  20 MG tablet Take 1 tab by mouth at night 03/04/14  Yes Tonia Ghent, MD  amLODipine (NORVASC) 10 MG tablet Take 1 tablet (10 mg total) by mouth daily. 07/22/14   Tonia Ghent, MD    Current Facility-Administered Medications  Medication Dose Route Frequency Provider Last Rate Last Dose  . 0.9 %  sodium chloride infusion   Intravenous Continuous Debbe Odea, MD   Stopped at 08/03/14 3614  . albuterol (PROVENTIL) (2.5 MG/3ML) 0.083% nebulizer solution 2.5 mg  2.5 mg Nebulization Q2H PRN Debbe Odea, MD      . amLODipine (NORVASC) tablet 10 mg  10 mg Oral Daily Debbe Odea, MD    10 mg at 08/05/14 1006  . aspirin EC tablet 81 mg  81 mg Oral Daily Berle Mull, MD   81 mg at 08/05/14 1006  . atorvastatin (LIPITOR) tablet 10 mg  10 mg Oral q1800 Berle Mull, MD   10 mg at 08/04/14 1736  . azithromycin (ZITHROMAX) 500 mg in dextrose 5 % 250 mL IVPB  500 mg Intravenous Q24H Debbe Odea, MD   500 mg at 08/05/14 0828  . dextromethorphan-guaiFENesin (MUCINEX DM) 30-600 MG per 12 hr tablet 1 tablet  1 tablet Oral BID Debbe Odea, MD   1 tablet at 08/05/14 1006  . enoxaparin (LOVENOX) injection 40 mg  40 mg Subcutaneous Q24H Berle Mull, MD   40 mg at 08/05/14 1006  . feeding supplement (ENSURE) (ENSURE) pudding 1 Container  1 Container Oral TID BM Debbe Odea, MD   1 Container at 08/05/14 1000  . hydrALAZINE (APRESOLINE) injection 10 mg  10 mg Intravenous Q6H PRN Debbe Odea, MD      . ipratropium-albuterol (DUONEB) 0.5-2.5 (3) MG/3ML nebulizer solution 3 mL  3 mL Nebulization TID Debbe Odea, MD   3 mL at 08/05/14 0841  . lisinopril (PRINIVIL,ZESTRIL) tablet 20 mg  20 mg Oral Daily Debbe Odea, MD   20 mg at 08/05/14 1006  . piperacillin-tazobactam (ZOSYN) IVPB 3.375 g  3.375 g Intravenous Q8H Dorrene German, RPH   3.375 g at 08/05/14 1121    Allergies as of 07/31/2014  . (No Known Allergies)    Family History  Problem Relation Age of Onset  . Hypertension Mother   . Heart attack Father   . Colon cancer Neg Hx   . Esophageal cancer Neg Hx     History   Social History  . Marital Status: Married    Spouse Name: N/A    Number of Children: 2  . Years of Education: N/A   Occupational History  . 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 Smoking status: Former Smoker    Types: Cigarettes    Quit date: 08/29/1985  . Smokeless tobacco: Former Systems developer    Types: Chew  . Alcohol Use: No  . Drug Use: No  . Sexual Activity: Not on file   Other Topics Concern  . 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  All other review of systems was otherwise negative.. Pt a bit confused, most of hx from wife  Physical Exam: Vital signs  in last 24 hours: Temp:  [98 F (36.7 C)-99.6 F (37.6 C)] 98.2 F (36.8 C) (12/08 1511) Pulse Rate:  [80-93] 80 (12/08 1511) Resp:  [18-20] 20 (12/08 1511) BP: (135-177)/(62-73) 144/62 mmHg (12/08 1511) SpO2:  [92 %-98 %] 98 % (12/08 1511) Weight:  [145 lb 11.6 oz (66.1 kg)] 145 lb 11.6 oz (66.1 kg) (12/08 0455) Last BM Date: 08/04/14 General:   Alert,  Well-developed, well-nourished,elderly WM  pleasant and cooperative in NAD, a little confused Head:  Normocephalic and atraumatic. Eyes:  Sclera clear, no icterus.   Conjunctiva pink. Ears:  Normal auditory acuity. Nose:  No deformity, discharge,  or lesions. Mouth:  No deformity or lesions.   Neck:  Supple; no masses or thyromegaly. Lungs:  Rhonchi and faint wheezing bilat  Heart:  Regular rate and rhythm; no murmurs, clicks, rubs,  or gallops. Abdomen:  Soft,nontender, BS active,nonpalp mass or hsm.   Rectal:  Deferred  Msk:  Symmetrical without gross deformities. . Pulses:  Normal pulses noted. Extremities:  Without clubbing or edema. Neurologic:  Alert and  oriented x3  grossly normal neurologically. Skin:  Intact without significant lesions or rashes.. Psych:  Alert and cooperative. Normal mood and affect.  Intake/Output from previous day: 12/07 0701 - 12/08 0700 In: 240 [P.O.:240] Out: 2525 [Urine:2525] Intake/Output this shift: Total I/O In: 660 [P.O.:360; IV Piggyback:300] Out: -   Lab Results:  Recent Labs  08/03/14 0400 08/04/14 0416 08/05/14 0433  WBC 16.6* 11.4* 8.5  HGB 11.0* 10.2* 10.2*  HCT 35.4* 33.0* 32.1*    PLT 240 226 273   BMET  Recent Labs  08/04/14 0416 08/05/14 0433  NA 136* 132*  K 3.9 4.3  CL 93* 91*  CO2 30 32  GLUCOSE 122* 99  BUN 13 11  CREATININE 1.09 1.16  CALCIUM 9.0 9.2   LFT No results for input(s): PROT, ALBUMIN, AST, ALT, ALKPHOS, BILITOT, BILIDIR, IBILI in the last 72 hours. PT/INR No results for input(s): LABPROT, INR in the last 72 hours. Hepatitis Panel No results for input(s): HEPBSAG, HCVAB, HEPAIGM, HEPBIGM in the last 72 hours.     IMPRESSION:  #62 80 year old white male with recurrent pneumonia currently with a bibasilar pneumonia immunity acquired with question of aspiration component. Pt improved over admission but still hypoxic and requiring nasal O2 #2 recurrent distal esophageal stricture with intermittent dysphagia. ALSO HAS DYSMOTILITY FOR WHICH THERE IS NO ADEQUATE TREATMENT. #3 atrial fibrillation with RVR on admit #4 hx  of TIA #5 hypertension #6 history of diastolic dysfunction last echo EF 65-70% #7 COPD     PLAN: 1. Continue daily PPI 2. Soft diet and fortified liquids 3. Patient MIGHT benefit from EGD with esophageal dilation with ,however he is NOT (AND MAY NEVER BE) an  appropriate   candidate for sedation currently due to persistent hypoxia and other comorbid conditions. 4. We will follow peripherally over the next few days of hospitalization and  Look for marked improvement in oxygenation and can possibly schedule while inpatient. Lovenox would need to be held prior  to procedure  I have discussed this with the patient and his wife.  Docia Chuck. Geri Seminole., M.D. [facility] Division of Gastroenterology

## 2014-08-05 NOTE — Plan of Care (Signed)
Problem: ICU Phase Progression Outcomes Goal: Dyspnea controlled at rest Outcome: Completed/Met Date Met:  08/05/14

## 2014-08-06 DIAGNOSIS — E871 Hypo-osmolality and hyponatremia: Secondary | ICD-10-CM

## 2014-08-06 DIAGNOSIS — J9601 Acute respiratory failure with hypoxia: Secondary | ICD-10-CM

## 2014-08-06 LAB — CBC
HCT: 33.1 % — ABNORMAL LOW (ref 39.0–52.0)
Hemoglobin: 10.5 g/dL — ABNORMAL LOW (ref 13.0–17.0)
MCH: 26.6 pg (ref 26.0–34.0)
MCHC: 31.7 g/dL (ref 30.0–36.0)
MCV: 83.8 fL (ref 78.0–100.0)
PLATELETS: 278 10*3/uL (ref 150–400)
RBC: 3.95 MIL/uL — ABNORMAL LOW (ref 4.22–5.81)
RDW: 14 % (ref 11.5–15.5)
WBC: 10.2 10*3/uL (ref 4.0–10.5)

## 2014-08-06 LAB — BASIC METABOLIC PANEL
Anion gap: 13 (ref 5–15)
BUN: 13 mg/dL (ref 6–23)
CALCIUM: 9 mg/dL (ref 8.4–10.5)
CO2: 30 mEq/L (ref 19–32)
Chloride: 86 mEq/L — ABNORMAL LOW (ref 96–112)
Creatinine, Ser: 1.01 mg/dL (ref 0.50–1.35)
GFR calc Af Amer: 75 mL/min — ABNORMAL LOW (ref >90)
GFR, EST NON AFRICAN AMERICAN: 65 mL/min — AB (ref >90)
GLUCOSE: 86 mg/dL (ref 70–99)
Potassium: 3.4 mEq/L — ABNORMAL LOW (ref 3.7–5.3)
SODIUM: 129 meq/L — AB (ref 137–147)

## 2014-08-06 MED ORDER — ACETAMINOPHEN 325 MG PO TABS
650.0000 mg | ORAL_TABLET | Freq: Four times a day (QID) | ORAL | Status: DC | PRN
  Administered 2014-08-06: 650 mg via ORAL
  Filled 2014-08-06: qty 2

## 2014-08-06 MED ORDER — KETOROLAC TROMETHAMINE 15 MG/ML IJ SOLN
15.0000 mg | Freq: Once | INTRAMUSCULAR | Status: AC
Start: 2014-08-06 — End: 2014-08-06
  Administered 2014-08-06: 15 mg via INTRAVENOUS
  Filled 2014-08-06: qty 1

## 2014-08-06 MED ORDER — SALINE SPRAY 0.65 % NA SOLN
1.0000 | NASAL | Status: DC | PRN
  Filled 2014-08-06: qty 44

## 2014-08-06 MED ORDER — POTASSIUM CHLORIDE CRYS ER 20 MEQ PO TBCR
40.0000 meq | EXTENDED_RELEASE_TABLET | Freq: Once | ORAL | Status: AC
  Administered 2014-08-06: 40 meq via ORAL
  Filled 2014-08-06: qty 2

## 2014-08-06 MED ORDER — FUROSEMIDE 10 MG/ML IJ SOLN
40.0000 mg | Freq: Once | INTRAMUSCULAR | Status: AC
  Administered 2014-08-06: 40 mg via INTRAVENOUS
  Filled 2014-08-06: qty 4

## 2014-08-06 NOTE — Plan of Care (Signed)
Problem: Phase II Progression Outcomes Goal: Discharge plan remains appropriate-arrangements made Outcome: Completed/Met Date Met:  08/06/14 Goal: Other Phase II Outcomes/Goals Outcome: Completed/Met Date Met:  08/06/14  Problem: Discharge Progression Outcomes Goal: Tolerating diet Outcome: Completed/Met Date Met:  08/06/14

## 2014-08-06 NOTE — Progress Notes (Signed)
Physical Therapy Treatment Patient Details Name: [name] MRN: [id number] DOB: 1925-06-10 Today's Date: 08/06/2014    History of Present Illness 80 y.o. male with Past medical history of hypertension, esophageal stricture, dysphagia, diastolic dysfunction admitted 07/31/14 for CAP (community acquired pneumonia).    PT Comments    Pt continues to require Min assist, at times, for mobility. Remained on [place] O2 during session. Limited session, at pt's request, due to moderate headache pain.  Follow Up Recommendations  Home health PT;Supervision - Intermittent     Equipment Recommendations  None recommended by PT    Recommendations for Other Services       Precautions / Restrictions Precautions Precautions: Fall Precaution Comments: monitor sats Restrictions Weight Bearing Restrictions: No    Mobility  Bed Mobility Overal bed mobility: Needs Assistance Bed Mobility: Supine to Sit     Supine to sit: Supervision        Transfers Overall transfer level: Needs assistance Equipment used: Rolling walker (2 wheeled) Transfers: Sit to/from Stand Sit to Stand: Min assist         General transfer comment: slight assist to steady with standing  Ambulation/Gait   Ambulation Distance (Feet): 75 Feet Assistive device: Rolling walker (2 wheeled) Gait Pattern/deviations: Step-through pattern;Decreased stride length;Trunk flexed     General Gait Details: Remained on [place] O2 during session. Assist to stabilize. Pt reported HA while ambulating and declined to walk any farther once back at room.    Stairs            Wheelchair Mobility    Modified Rankin (Stroke Patients Only)       Balance                                    Cognition Arousal/Alertness: Awake/alert Behavior During Therapy: WFL for tasks assessed/performed Overall Cognitive Status: Within Functional Limits for tasks assessed                      Exercises       General Comments        Pertinent Vitals/Pain Pain Assessment: Faces Faces Pain Scale: Hurts even more Pain Location: head Pain Descriptors / Indicators: Aching Pain Intervention(s): Limited activity within patient's tolerance    Home Living                      Prior Function            PT Goals (current goals can now be found in the care plan section) Progress towards PT goals: Progressing toward goals    Frequency  Min 3X/week    PT Plan Current plan remains appropriate    Co-evaluation             End of Session Equipment Utilized During Treatment: Oxygen Activity Tolerance: Patient limited by pain Patient left: in chair;with call bell/phone within reach;with chair alarm set     Time: 3009-2330 PT Time Calculation (min) (ACUTE ONLY): 15 min  Charges:  $Gait Training: 8-22 mins                    G Codes:      Weston Anna, MPT Pager: [phone]

## 2014-08-06 NOTE — Progress Notes (Addendum)
[facility] PROGRESS NOTE  DIQUAN KASSIS XBJ:[id number] DOB: May 07, 1925 DOA: 07/31/2014 PCP: [name], MD   Brief narrative 80 year old male with history of hypertension, esophageal stricture, dysphagia, diastolic dysfunction, history of TIA and GERD, recurrent pneumonia presented with cough with shortness of breath for past several days. Also had associated generalized weakness. Patient hypoxic with leukocytosis and fever on admission, with elevated lactate meeting criteria for sepsis secondary to pneumonia seen on chest x-ray. Also found to have new onset A. fib with RVR and placed on Cardizem drip on admission.  Assessment/Plan: Sepsis secondary to community-acquired versus aspiration pneumonia Monitor on telemetry. On empiric Zosyn and Zithromax. Vancomycin discontinued on 12/5 given clinical improvement. Remains afebrile. Leukocytosis improved. Follow-up chest x-ray on 12/5 showed bibasilar infiltrates( L>R) -Blood and sputum culture negative. Rapid flu, urine for strep, respiratory panel and Legionella antigen negative -Continue O2 via nasal cannula. Wean to 2 L today. -Continue scheduled and when necessary nebs. Continue Mucinex and intensive spirometry -PT eval.  Active problem Dysphagia Patient has history of esophageal stricture. He was plan for repeat endoscopy earlier this year due to recurrent dysphagia but did not happen. Esophagogram done this admission showing stricture with diameter of 1.1 cm. GI evaluated the patient and recommend EGD prior to discharge once hypoxia resolves and patient able to tolerate anesthesia.  New-onset A. fib with RVR -Possibly triggered by pneumonia. Currently on normal sinus rhythm. Required Cardizem drip on admission. -2-D echo results pending. -TSH normal  Diastolic CHF Currently euvolemic.  Essential hypertension  HCTZ discontinued due to hypotension and ongoing hyponatremia. Continue lisinopril and  amlodipine   Hyponatremia Possibly secondary to HCTZ which has been held. Sodium still at 129. Will order fluid restriction.  Hypokalemia Replenish  Acute kidney injury On admission possibly due to hypotension, HCTZ and ACE inhibitor. Now resolved. ACE inhibitor resumed  History of TIA Continue aspirin and statin    DVT prophylaxis: Subcutaneous Lovenox  Diet: Soft   Code Status: DO NOT RESUSCITATE Family Communication: None at bedside Disposition Plan: Home once clinical improvement of pneumonia. Plan for EGD prior to discharge.   Consultants:  GI  Procedures:  None  Antibiotics:  Vancomycin 12/3-12/5  Zosyn 12/3--  Azithromycin 12/4--  HPI/Subjective: Patient seen and examined. Still has productive cough. Reports breathing to be better.  Objective: Filed Vitals:   08/06/14 0524  BP: 149/72  Pulse: 82  Temp: 98.2 F (36.8 C)  Resp: 19    Intake/Output Summary (Last 24 hours) at 08/06/14 0834 Last data filed at 08/06/14 0831  Gross per 24 hour  Intake   1492 ml  Output   1525 ml  Net    -33 ml   Filed Weights   08/04/14 0440 08/05/14 0455 08/06/14 0524  Weight: 68.2 kg (150 lb 5.7 oz) 66.1 kg (145 lb 11.6 oz) 67.3 kg (148 lb 5.9 oz)    Exam:   General: Elderly male lying in bed in no acute distress  HEENT: Moist oral mucosa  Cardiovascular: S1 and S2 irregular, no murmurs  Respiratory: Coarse crackles over right lung base, no rhonchi or wheeze  Abdomen: Soft, Nondistended, tender, bowel sounds present  Musculoskeletal: Warm, no edema  CNS: Alert and oriented  Data Reviewed: Basic Metabolic Panel:  Recent Labs Lab 08/02/14 0407 08/02/14 1300 08/04/14 0416 08/05/14 0433 08/06/14 0444  NA 134* 133* 136* 132* 129*  K 4.7 4.6 3.9 4.3 3.4*  CL 96 95* 93* 91* 86*  CO2 29 31 30  32 30  GLUCOSE  113* 91 122* 99 86  BUN 25* 20 13 11 13   CREATININE 1.46* 1.30 1.09 1.16 1.01  CALCIUM 8.5 8.6 9.0 9.2 9.0   Liver Function  Tests:  Recent Labs Lab 07/31/14 2154 08/01/14 0700  AST 28 22  ALT 17 16  ALKPHOS 106 76  BILITOT 0.3 0.5  PROT 8.2 6.3  ALBUMIN 3.9 2.8*   No results for input(s): LIPASE, AMYLASE in the last 168 hours. No results for input(s): AMMONIA in the last 168 hours. CBC:  Recent Labs Lab 07/31/14 2154 08/01/14 0700 08/02/14 0407 08/03/14 0400 08/04/14 0416 08/05/14 0433 08/06/14 0444  WBC 21.9* 20.5* 15.4* 16.6* 11.4* 8.5 10.2  NEUTROABS 18.9* 18.0*  --   --   --   --   --   HGB 13.5 11.5* 10.5* 11.0* 10.2* 10.2* 10.5*  HCT 42.1 35.9* 33.2* 35.4* 33.0* 32.1* 33.1*  MCV 85.1 84.7 86.2 86.3 86.8 83.6 83.8  PLT 333 PLATELET CLUMPS NOTED ON SMEAR, COUNT APPEARS ADEQUATE 238 240 226 273 278   Cardiac Enzymes:  Recent Labs Lab 07/31/14 2154  TROPONINI <0.30   BNP (last 3 results)  Recent Labs  12/17/13 0422 01/09/14 1250 07/31/14 2154  PROBNP 1657.0* 131.0* 657.5*   CBG: No results for input(s): GLUCAP in the last 168 hours.  Recent Results (from the past 240 hour(s))  Culture, blood (routine x 2) Call MD if unable to obtain prior to antibiotics being given     Status: None (Preliminary result)   Collection Time: 08/01/14 12:43 AM  Result Value Ref Range Status   Specimen Description BLOOD RIGHT HAND  Final   Special Requests BOTTLES DRAWN AEROBIC ONLY 3CC  Final   Culture  Setup Time   Final    08/01/2014 03:23 Performed at [facility]    Culture   Final           BLOOD CULTURE RECEIVED NO GROWTH TO DATE CULTURE WILL BE HELD FOR 5 DAYS BEFORE ISSUING A FINAL NEGATIVE REPORT Performed at [facility]    Report Status PENDING  Incomplete  Culture, blood (routine x 2) Call MD if unable to obtain prior to antibiotics being given     Status: None (Preliminary result)   Collection Time: 08/01/14 12:43 AM  Result Value Ref Range Status   Specimen Description BLOOD LEFT FOREARM  Final   Special Requests BOTTLES DRAWN AEROBIC AND ANAEROBIC 5CC   Final   Culture  Setup Time   Final    08/01/2014 03:22 Performed at [facility]    Culture   Final           BLOOD CULTURE RECEIVED NO GROWTH TO DATE CULTURE WILL BE HELD FOR 5 DAYS BEFORE ISSUING A FINAL NEGATIVE REPORT Performed at [facility]    Report Status PENDING  Incomplete  MRSA PCR Screening     Status: None   Collection Time: 08/01/14  6:20 AM  Result Value Ref Range Status   MRSA by PCR NEGATIVE NEGATIVE Final    Comment:        The GeneXpert MRSA Assay (FDA approved for NASAL specimens only), is one component of a comprehensive MRSA colonization surveillance program. It is not intended to diagnose MRSA infection nor to guide or monitor treatment for MRSA infections.   Respiratory virus panel (routine influenza)     Status: None   Collection Time: 08/01/14  8:40 PM  Result Value Ref Range Status   Source - RVPAN  NOSE  Final   Respiratory Syncytial Virus A NOT DETECTED  Final   Respiratory Syncytial Virus B NOT DETECTED  Final   Influenza A NOT DETECTED  Final   Influenza B NOT DETECTED  Final   Parainfluenza 1 NOT DETECTED  Final   Parainfluenza 2 NOT DETECTED  Final   Parainfluenza 3 NOT DETECTED  Final   Metapneumovirus NOT DETECTED  Final   Rhinovirus NOT DETECTED  Final   Adenovirus NOT DETECTED  Final   Influenza A H1 NOT DETECTED  Final   Influenza A H3 NOT DETECTED  Final    Comment: (NOTE)       Normal Reference Range for each Analyte: NOT DETECTED Testing performed using the Luminex xTAG Respiratory Viral Panel test kit. The analytical performance characteristics of this assay have been determined by [facility].  The modifications have not been cleared or approved by the FDA. This assay has been validated pursuant to the CLIA regulations and is used for clinical purposes. Performed at [facility], sputum-assessment     Status: None   Collection Time: 08/02/14  7:30 PM  Result Value Ref Range  Status   Specimen Description SPUTUM  Final   Special Requests NONE  Final   Sputum evaluation   Final    THIS SPECIMEN IS ACCEPTABLE. RESPIRATORY CULTURE REPORT TO FOLLOW.   Report Status 08/02/2014 FINAL  Final  Culture, respiratory (NON-Expectorated)     Status: None   Collection Time: 08/02/14  7:36 PM  Result Value Ref Range Status   Specimen Description SPUTUM  Final   Special Requests NONE  Final   Gram Stain   Final    MODERATE WBC PRESENT, PREDOMINANTLY PMN FEW SQUAMOUS EPITHELIAL CELLS PRESENT RARE YEAST Performed at [facility]    Culture   Final    FEW CANDIDA ALBICANS Performed at [facility]    Report Status 08/05/2014 FINAL  Final     Studies: Dg Esophagus  08/04/2014   CLINICAL DATA:  Dysphagia.  Regurgitation.  Esophagitis.  EXAM: ESOPHOGRAM/BARIUM SWALLOW  TECHNIQUE: Single contrast examination was performed using thin barium. A barium pill was also administered orally.  FLUOROSCOPY TIME:  1 min, 23 seconds  COMPARISON:  None.  FINDINGS: The patient has limited mobility in the exam was performed with patient lying supine on the table, sipping from a straw.  Pharyngeal phase of swallowing was not assessed due to difficulty with patient positioning.  Primary peristaltic waves in the esophagus were disrupted on 3 out of 4 swallows, with secondary contractions and periodic tertiary contractions observed.  The maximum distention of the distal esophagus was 1.1 cm due to smooth distal narrowing. I am uncertain if this represents a true smooth stricture or is simply due to the underlying dysmotility, but a 13 mm barium tablet did not pass through this part of the esophagus, and accordingly I am inclined to believe that this is a true smooth stricturing.  IMPRESSION: 1. Suspected smooth stricturing of the distal esophagus, narrowed down to 1.1 cm. A 13 mm barium tablet did not pass this region. 2. Nonspecific esophageal dysmotility disorder. 3. There were  limitations for today's exam, as the patient had very limited mobility. The normal esophagram involves standing, turning, and a variety of maneuvers requiring good mobility. Today's exam was performed with the patient lying down the prone position. This lowers diagnostic sensitivity and specificity.   Electronically Signed   By: Sherryl Barters M.D.  On: 08/04/2014 15:22    Scheduled Meds: . amLODipine  10 mg Oral Daily  . aspirin EC  81 mg Oral Daily  . atorvastatin  10 mg Oral q1800  . azithromycin  500 mg Intravenous Q24H  . dextromethorphan-guaiFENesin  1 tablet Oral BID  . enoxaparin (LOVENOX) injection  40 mg Subcutaneous Q24H  . feeding supplement (ENSURE)  1 Container Oral TID BM  . ipratropium-albuterol  3 mL Nebulization TID  . lisinopril  20 mg Oral Daily  . pantoprazole  40 mg Oral Daily  . piperacillin-tazobactam (ZOSYN)  IV  3.375 g Intravenous Q8H  . potassium chloride  40 mEq Oral Once   Continuous Infusions: . sodium chloride 10 mL/hr at 08/06/14 0348      Time spent: 25 minutes    Jathniel Smeltzer, [place]  [facility] Pager [phone]. If 7PM-7AM, please contact night-coverage at [email or web address], password [facility] 08/06/2014, 8:34 AM  LOS: 6 days

## 2014-08-06 NOTE — Progress Notes (Signed)
GI ATTENDING  I saw the patient today. We reviewed the swallowing study. I suspect the good portion of his problem is dysmotility given the lack of response to relatively recent large caliber esophageal dilation. I do not feel he is an appropriate candidate for sedation and repeat therapeutic dilation. I recommend that he continue with modified diet. He agrees. Will sign off. Thank you  Docia Chuck. Geri Seminole., M.D. [facility] Division of Gastroenterology

## 2014-08-06 NOTE — Plan of Care (Signed)
Problem: Phase I Progression Outcomes Goal: O2 sats > or equal 90% or at baseline Outcome: Completed/Met Date Met:  08/06/14     

## 2014-08-07 LAB — BASIC METABOLIC PANEL
ANION GAP: 11 (ref 5–15)
BUN: 16 mg/dL (ref 6–23)
CALCIUM: 9.1 mg/dL (ref 8.4–10.5)
CO2: 32 mEq/L (ref 19–32)
Chloride: 86 mEq/L — ABNORMAL LOW (ref 96–112)
Creatinine, Ser: 1.24 mg/dL (ref 0.50–1.35)
GFR, EST AFRICAN AMERICAN: 58 mL/min — AB (ref >90)
GFR, EST NON AFRICAN AMERICAN: 50 mL/min — AB (ref >90)
Glucose, Bld: 92 mg/dL (ref 70–99)
Potassium: 3.6 mEq/L — ABNORMAL LOW (ref 3.7–5.3)
Sodium: 129 mEq/L — ABNORMAL LOW (ref 137–147)

## 2014-08-07 LAB — CULTURE, BLOOD (ROUTINE X 2)
Culture: NO GROWTH
Culture: NO GROWTH

## 2014-08-07 MED ORDER — LEVOFLOXACIN 750 MG PO TABS: 750.0000 mg | ORAL_TABLET | Freq: Every day | ORAL | Status: DC

## 2014-08-07 MED ORDER — GUAIFENESIN ER 600 MG PO TB12: 600.0000 mg | ORAL_TABLET | Freq: Two times a day (BID) | ORAL | Status: DC

## 2014-08-07 MED ORDER — BENZONATATE 100 MG PO CAPS: 100.0000 mg | ORAL_CAPSULE | Freq: Three times a day (TID) | ORAL | Status: DC | PRN

## 2014-08-07 MED ORDER — PANTOPRAZOLE SODIUM 40 MG PO TBEC: 40.0000 mg | DELAYED_RELEASE_TABLET | Freq: Every day | ORAL | Status: DC

## 2014-08-07 NOTE — Plan of Care (Signed)
Problem: Discharge Progression Outcomes Goal: Discharge plan in place and appropriate Outcome: Completed/Met Date Met:  08/07/14 Goal: Pain controlled with appropriate interventions Outcome: Completed/Met Date Met:  08/07/14 Goal: Activity appropriate for discharge plan Outcome: Completed/Met Date Met:  08/07/14 Goal: Hemodynamically stable Outcome: Completed/Met Date Met:  69/62/95 Goal: Complications resolved/controlled Outcome: Completed/Met Date Met:  08/07/14 Goal: Other Discharge Outcomes/Goals Outcome: Completed/Met Date Met:  08/07/14

## 2014-08-07 NOTE — Plan of Care (Signed)
Problem: Discharge Progression Outcomes Goal: Flu vaccine received if indicated Outcome: Completed/Met Date Met:  08/07/14 Goal: Pneumonia vaccine received if indicated Outcome: Completed/Met Date Met:  08/07/14

## 2014-08-07 NOTE — Plan of Care (Signed)
Problem: Phase II Progression Outcomes Goal: ADLs completed with minimal assistance Outcome: Progressing

## 2014-08-07 NOTE — Plan of Care (Signed)
Problem: Discharge Progression Outcomes Goal: Dyspnea controlled Outcome: Completed/Met Date Met:  08/07/14

## 2014-08-07 NOTE — Discharge Summary (Signed)
Physician Discharge Summary  Patient ID: [name] MRN: [id number] DOB/AGE: 1925-04-06 80 y.o.  Admit date: 07/31/2014 Discharge date: 08/07/2014  Primary Care Physician:  [name], MD  Discharge Diagnoses:    . CAP (community acquired pneumonia) . Acute respiratory failure  . Atrial fibrillation with RVR . Diastolic dysfunction . Essential hypertension . Lactic acidosis . Esophageal dysmotility   Consults: Gastroenterology   Recommendations for Outpatient Follow-up:  Recommend repeating chest x-ray in 2-3 weeks to ensure complete resolution of pneumonia  Please note HCTZ has been discontinued due to hyponatremia. Recommend checking BMET at the time of follow-up appointment.  Allergies:  No Known Allergies   Discharge Medications:   Medication List    STOP taking these medications        hydrochlorothiazide 25 MG tablet  Commonly known as:  HYDRODIURIL      TAKE these medications        albuterol 108 (90 BASE) MCG/ACT inhaler  Commonly known as:  PROVENTIL HFA;VENTOLIN HFA  Inhale 2 puffs into the lungs every 6 (six) hours as needed for wheezing or shortness of breath.     amLODipine 10 MG tablet  Commonly known as:  NORVASC  Take 1 tablet (10 mg total) by mouth daily.     aspirin EC 81 MG tablet  Take 81 mg by mouth daily.     benzonatate 100 MG capsule  Commonly known as:  TESSALON PERLES  Take 1 capsule (100 mg total) by mouth 3 (three) times daily as needed for cough.     fluorouracil 5 % cream  Commonly known as:  EFUDEX  Apply 1 application topically daily.     guaiFENesin 600 MG 12 hr tablet  Commonly known as:  MUCINEX  Take 1 tablet (600 mg total) by mouth 2 (two) times daily.     levofloxacin 750 MG tablet  Commonly known as:  LEVAQUIN  Take 1 tablet (750 mg total) by mouth daily. X 5 more days     lisinopril 20 MG tablet  Commonly known as:  PRINIVIL,ZESTRIL  Take 1 tablet (20 mg total) by mouth daily.     pantoprazole 40  MG tablet  Commonly known as:  PROTONIX  Take 1 tablet (40 mg total) by mouth daily.     PRESERVISION AREDS 2 Caps  Take as directed.     simvastatin 20 MG tablet  Commonly known as:  ZOCOR  Take 1 tab by mouth at night         Brief H and P: For complete details please refer to admission H and P, but in brief patient is a 80 year old male with hypertension, esophageal stricture, dysphagia, diastolic dysfunction presented with cough, shortness of breath, not feeling better in the last few weeks prior to admission. Patient reported cough as well as wheezing which was progressively worsening and not improving. He did not have any treatment as an outpatient. Denied any fevers or chills but reported generalized weakness. Patient did have an episode of vomiting while in the ER. Chest x-ray showed possible left-sided infiltrate, patient was admitted for further workup.  Hospital Course:   Sepsis secondary to community acquired pneumonia versus aspiration pneumonia Patient was admitted, chest x-ray showed possible left-sided infiltrate. He did have one episode of vomiting in the ED. He was placed on a Zithromax and Zosyn. Vancomycin was discontinued on 12/5 given clinical improvement. Leukocytosis has improved, follow-up chest x-ray on 12/5 showed bibasilar infiltrates. Blood cultures has remained negative, sputum culture  negative, rapid flu, urine for strep antigen, respiratory panel and legionella antigens have been negative. Home O2 eval was done and recommended 2L upon ambulation.   Dysphagia: Patient has a history of esophageal stricture, was planning to have repeat endoscopy earlier this year due to recurrent dysphagia. Esophagogram done this admission showed stricture of a diameter of 1.1 cm. Gastroenterology was consulted. Patient was seen by Dr. Henrene Pastor who felt that good portion of his problem is dysmotility given the lack of response to relatively recent large caliber esophageal dilation.  He did not feel that patient is an appropriate candidate for sedation and repeat therapeutic dilation. Patient was recommended to continue with current soft diet.  Atrial fibrillation with RVR: Paroxysmal Possibly was triggered by pneumonia, currently normal sinus rhythm, patient did require Cardizem drip on admission. He was placed on aspirin. Thyroid function tests normal.  Chronic diastolic dysfunction, grade 1 Once oral intake was adequate with good urine output, hydration was stopped, no signs of pulmonary edema  Essential hypertension BP is currently stable, hydrochlorothiazide was discontinued due to hyponatremia  Hyponatremia Possibly due to dehydration and HCTZ, patient has been hydrated adequately, euvolemic however sodium has been stable on the lower side. Discontinued HCTZ from outpatient medication profile. Please follow BMET at the time of follow-up appointment  Acute kidney injury Resolved, patient presented with creatinine of 1.63 at the time of admission lisinopril and HCTZ were held, and gently hydrated with IV fluids. Creatinine has improved to 1.2.  Day of Discharge BP 147/77 mmHg  Pulse 84  Temp(Src) 97.8 F (36.6 C) (Oral)  Resp 14  Ht 5\' 6"  (1.676 m)  Wt 64.9 kg (143 lb 1.3 oz)  BMI 23.10 kg/m2  SpO2 95%  Physical Exam: General: Alert and awake oriented x3 not in any acute distress. HEENT: anicteric sclera, pupils reactive to light and accommodation CVS: S1-S2 clear no murmur rubs or gallops Chest: Decreased breath sounds at the bases, no rhonchi Abdomen: soft nontender, nondistended, normal bowel sounds Extremities: no cyanosis, clubbing or edema noted bilaterally Neuro: Cranial nerves II-XII intact, no focal neurological deficits   The results of significant diagnostics from this hospitalization (including imaging, microbiology, ancillary and laboratory) are listed below for reference.    LAB RESULTS: Basic Metabolic Panel:  Recent Labs Lab  08/06/14 0444 08/07/14 0457  NA 129* 129*  K 3.4* 3.6*  CL 86* 86*  CO2 30 32  GLUCOSE 86 92  BUN 13 16  CREATININE 1.01 1.24  CALCIUM 9.0 9.1   Liver Function Tests:  Recent Labs Lab 07/31/14 2154 08/01/14 0700  AST 28 22  ALT 17 16  ALKPHOS 106 76  BILITOT 0.3 0.5  PROT 8.2 6.3  ALBUMIN 3.9 2.8*   No results for input(s): LIPASE, AMYLASE in the last 168 hours. No results for input(s): AMMONIA in the last 168 hours. CBC:  Recent Labs Lab 08/01/14 0700  08/05/14 0433 08/06/14 0444  WBC 20.5*  < > 8.5 10.2  NEUTROABS 18.0*  --   --   --   HGB 11.5*  < > 10.2* 10.5*  HCT 35.9*  < > 32.1* 33.1*  MCV 84.7  < > 83.6 83.8  PLT PLATELET CLUMPS NOTED ON SMEAR, COUNT APPEARS ADEQUATE  < > 273 278  < > = values in this interval not displayed. Cardiac Enzymes:  Recent Labs Lab 07/31/14 2154  TROPONINI <0.30   BNP: Invalid input(s): POCBNP CBG: No results for input(s): GLUCAP in the last 168 hours.  Significant  Diagnostic Studies:  Dg Chest Port 1 View  08/01/2014   CLINICAL DATA:  This exam identified as missing a report at 1303 hr on 08/01/2014.  80 year old male with shortness of breath, respiratory distress, nausea vomiting, chills. Initial encounter.  EXAM: PORTABLE CHEST - 1 VIEW  COMPARISON:  01/17/2014 and earlier.  FINDINGS: Portable AP semi upright view at 2205 hr on 07/31/2014. Stable cardiac size and mediastinal contours. Stable lung volumes. No pneumothorax or pulmonary edema. No pleural effusion. Chronic increased reticular opacity at both lung bases. Two more nodular areas of opacity off the left hilum appear to be new. Visualized tracheal air column is within normal limits.  IMPRESSION: Chronic lung disease with 2 small new nodular opacities about the left hilum, could reflect developing infection.   Electronically Signed   By: Lars Pinks M.D.   On: 08/01/2014 13:07      Disposition and Follow-up:     Discharge Instructions    Discharge instructions     Complete by:  As directed   Please continue Norvasc. Please note that hydrochlorothiazide has been stopped due to low sodium.   Diet: SOFT     Increase activity slowly    Complete by:  As directed             DISPOSITION: Home with home PT, OT, RN, home health aide  DIET: Soft diet   TESTS THAT NEED FOLLOW-UP BMET, chest x-ray   DISCHARGE FOLLOW-UP Follow-up Information    Follow up with [name], MD. Schedule an appointment as soon as possible for a visit in 10 days.   Specialty:  Family Medicine   Why:  for hospital follow-up, , obtain labs for BMET. You will need chest Xray in 2 weeks to ensure complete resolution of pneumonia    Contact information:   [place] [address] [phone]       Time spent on Discharge: 40 mins  Signed:   Levon Boettcher M.D. [facility] 08/07/2014, 7:32 AM Pager: [phone]

## 2014-08-07 NOTE — Plan of Care (Signed)
Problem: Phase II Progression Outcomes Goal: O2 sats > equal to 90% on RA or at baseline Outcome: Completed/Met Date Met:  08/07/14

## 2014-08-07 NOTE — Plan of Care (Signed)
Problem: Phase II Progression Outcomes Goal: Activity at appropriate level-compared to baseline (UP IN CHAIR FOR HEMODIALYSIS)  Outcome: Not Progressing Patient going home with PT/OT

## 2014-08-07 NOTE — Plan of Care (Signed)
Problem: Phase II Progression Outcomes Goal: Dyspnea controlled w/progressive activity Outcome: Progressing     

## 2014-08-07 NOTE — Plan of Care (Signed)
Problem: ICU Phase Progression Outcomes Goal: Other ICU Phase Outcomes/Goals Outcome: Adequate for Discharge

## 2014-08-07 NOTE — Plan of Care (Signed)
Problem: Discharge Progression Outcomes Goal: Independent ADLs or Home Health Care Outcome: Completed/Met Date Met:  08/07/14

## 2014-08-07 NOTE — Plan of Care (Signed)
Problem: Discharge Progression Outcomes Goal: Barriers To Progression Addressed/Resolved Outcome: Completed/Met Date Met:  08/07/14

## 2014-08-07 NOTE — Progress Notes (Signed)
O2 eval:   93% on Room Air at rest.  86% on Room Air while walking.  91% on 2L [place] while walking. 93% on 4L [place] while walking.

## 2014-08-07 NOTE — Plan of Care (Signed)
Problem: Discharge Progression Outcomes Goal: Able to self administer respiratory meds Outcome: Progressing Pt going home with [facility] RN

## 2014-08-07 NOTE — Plan of Care (Signed)
Problem: ICU Phase Progression Outcomes Goal: O2 sats trending toward baseline Outcome: Progressing Pt going home on oxygen. Goal: Flu/PneumoVaccines if indicated Outcome: Completed/Met Date Met:  08/07/14 Goal: Other ICU Phase Outcomes/Goals Outcome: Adequate for Discharge     

## 2014-08-07 NOTE — Progress Notes (Signed)
CARE MANAGEMENT NOTE 08/07/2014  Patient:  [name], [name]   Account Number:  [id number]  Date Initiated:  08/06/2014  Documentation initiated by:  Karl Bales  Subjective/Objective Assessment:   Pt admitted with CAP     Action/Plan:   from home with wife   Anticipated DC Date:  08/08/2014   Anticipated DC Plan:  [place]  CM consult      Choice offered to / List presented to:  C-3 Spouse   DME arranged  [facility]      DME agency  [place] arranged  HH-1 RN  [place].   Status of service:  In process, will continue to follow Medicare Important Message given?  YES (If response is "NO", the following Medicare IM given date fields will be blank) Date Medicare IM given:  08/06/2014 Medicare IM given by:  Karl Bales Date Additional Medicare IM given:   Additional Medicare IM given by:    Discharge Disposition:  [place]  Per UR Regulation:  Reviewed for med. necessity/level of care/duration of stay  If discussed at [place] of Stay Meetings, dates discussed:   08/07/2014    Comments:  08/07/14 MMcGibboney, RN, BSN at [employer] with pt and wife at bedside concerning [employer]. They selected [place]. Referral given to in house rep.  08/06/14 MMcGibboney, RN, BSN Will continue to follow for Home Health needs.

## 2014-08-07 NOTE — Plan of Care (Signed)
Problem: Discharge Progression Outcomes Goal: Home O2 if indicated Outcome: Completed/Met Date Met:  08/07/14

## 2014-08-07 NOTE — Plan of Care (Signed)
Problem: Discharge Progression Outcomes Goal: O2 sats > or equal 90% or at baseline Outcome: Completed/Met Date Met:  08/07/14

## 2014-08-08 ENCOUNTER — Observation Stay (HOSPITAL_COMMUNITY)
Admission: EM | Admit: 2014-08-08 | Discharge: 2014-08-09 | Disposition: A | Discharge status: Skilled Nursing Facility | Payer: Medicare Other | Attending: Internal Medicine | Admitting: Internal Medicine

## 2014-08-08 ENCOUNTER — Encounter (HOSPITAL_COMMUNITY): Payer: Self-pay | Admitting: *Deleted

## 2014-08-08 ENCOUNTER — Emergency Department (HOSPITAL_COMMUNITY): Payer: Medicare Other

## 2014-08-08 DIAGNOSIS — E785 Hyperlipidemia, unspecified: Secondary | ICD-10-CM | POA: Insufficient documentation

## 2014-08-08 DIAGNOSIS — R51 Headache: Secondary | ICD-10-CM

## 2014-08-08 DIAGNOSIS — J189 Pneumonia, unspecified organism: Secondary | ICD-10-CM | POA: Diagnosis not present

## 2014-08-08 DIAGNOSIS — R531 Weakness: Secondary | ICD-10-CM

## 2014-08-08 DIAGNOSIS — J9600 Acute respiratory failure, unspecified whether with hypoxia or hypercapnia: Secondary | ICD-10-CM | POA: Insufficient documentation

## 2014-08-08 DIAGNOSIS — K219 Gastro-esophageal reflux disease without esophagitis: Secondary | ICD-10-CM | POA: Diagnosis not present

## 2014-08-08 DIAGNOSIS — E538 Deficiency of other specified B group vitamins: Secondary | ICD-10-CM | POA: Insufficient documentation

## 2014-08-08 DIAGNOSIS — Z8673 Personal history of transient ischemic attack (TIA), and cerebral infarction without residual deficits: Secondary | ICD-10-CM | POA: Diagnosis not present

## 2014-08-08 DIAGNOSIS — K222 Esophageal obstruction: Secondary | ICD-10-CM | POA: Insufficient documentation

## 2014-08-08 DIAGNOSIS — I4891 Unspecified atrial fibrillation: Secondary | ICD-10-CM | POA: Diagnosis not present

## 2014-08-08 DIAGNOSIS — E872 Acidosis: Secondary | ICD-10-CM | POA: Insufficient documentation

## 2014-08-08 DIAGNOSIS — I10 Essential (primary) hypertension: Secondary | ICD-10-CM | POA: Insufficient documentation

## 2014-08-08 DIAGNOSIS — K224 Dyskinesia of esophagus: Secondary | ICD-10-CM | POA: Insufficient documentation

## 2014-08-08 DIAGNOSIS — E860 Dehydration: Secondary | ICD-10-CM | POA: Diagnosis not present

## 2014-08-08 DIAGNOSIS — R519 Headache, unspecified: Secondary | ICD-10-CM

## 2014-08-08 LAB — CBC
HCT: 33.8 % — ABNORMAL LOW (ref 39.0–52.0)
HEMOGLOBIN: 11.1 g/dL — AB (ref 13.0–17.0)
MCH: 27.3 pg (ref 26.0–34.0)
MCHC: 32.8 g/dL (ref 30.0–36.0)
MCV: 83.3 fL (ref 78.0–100.0)
Platelets: 362 10*3/uL (ref 150–400)
RBC: 4.06 MIL/uL — AB (ref 4.22–5.81)
RDW: 13.8 % (ref 11.5–15.5)
WBC: 10.1 10*3/uL (ref 4.0–10.5)

## 2014-08-08 LAB — COMPREHENSIVE METABOLIC PANEL
ALT: 32 U/L (ref 0–53)
ANION GAP: 13 (ref 5–15)
AST: 25 U/L (ref 0–37)
Albumin: 2.7 g/dL — ABNORMAL LOW (ref 3.5–5.2)
Alkaline Phosphatase: 70 U/L (ref 39–117)
BUN: 16 mg/dL (ref 6–23)
CALCIUM: 9.2 mg/dL (ref 8.4–10.5)
CO2: 29 meq/L (ref 19–32)
Chloride: 87 mEq/L — ABNORMAL LOW (ref 96–112)
Creatinine, Ser: 1.01 mg/dL (ref 0.50–1.35)
GFR calc Af Amer: 75 mL/min — ABNORMAL LOW (ref >90)
GFR, EST NON AFRICAN AMERICAN: 65 mL/min — AB (ref >90)
GLUCOSE: 106 mg/dL — AB (ref 70–99)
Potassium: 4 mEq/L (ref 3.7–5.3)
Sodium: 129 mEq/L — ABNORMAL LOW (ref 137–147)
Total Bilirubin: 0.3 mg/dL (ref 0.3–1.2)
Total Protein: 7.1 g/dL (ref 6.0–8.3)

## 2014-08-08 LAB — MAGNESIUM: MAGNESIUM: 1.7 mg/dL (ref 1.5–2.5)

## 2014-08-08 LAB — CK: Total CK: 78 U/L (ref 7–232)

## 2014-08-08 MED ORDER — HYDROCHLOROTHIAZIDE 25 MG PO TABS
25.0000 mg | ORAL_TABLET | Freq: Every day | ORAL | Status: DC
  Filled 2014-08-08: qty 1

## 2014-08-08 MED ORDER — LISINOPRIL 20 MG PO TABS
20.0000 mg | ORAL_TABLET | Freq: Every day | ORAL | Status: DC
  Administered 2014-08-08 – 2014-08-09 (×2): 20 mg via ORAL
  Filled 2014-08-08 (×2): qty 1

## 2014-08-08 MED ORDER — ASPIRIN EC 81 MG PO TBEC
81.0000 mg | DELAYED_RELEASE_TABLET | Freq: Every day | ORAL | Status: DC
  Administered 2014-08-08 – 2014-08-09 (×2): 81 mg via ORAL
  Filled 2014-08-08 (×2): qty 1

## 2014-08-08 MED ORDER — LEVOFLOXACIN 750 MG PO TABS
750.0000 mg | ORAL_TABLET | Freq: Every day | ORAL | Status: DC
  Administered 2014-08-08 – 2014-08-09 (×2): 750 mg via ORAL
  Filled 2014-08-08 (×2): qty 1

## 2014-08-08 MED ORDER — ENOXAPARIN SODIUM 40 MG/0.4ML ~~LOC~~ SOLN
40.0000 mg | SUBCUTANEOUS | Status: DC
  Administered 2014-08-08 – 2014-08-09 (×2): 40 mg via SUBCUTANEOUS
  Filled 2014-08-08 (×2): qty 0.4

## 2014-08-08 MED ORDER — SODIUM CHLORIDE 0.9 % IJ SOLN
3.0000 mL | Freq: Two times a day (BID) | INTRAMUSCULAR | Status: DC
  Administered 2014-08-08 – 2014-08-09 (×2): 3 mL via INTRAVENOUS

## 2014-08-08 MED ORDER — PANTOPRAZOLE SODIUM 40 MG PO TBEC
40.0000 mg | DELAYED_RELEASE_TABLET | Freq: Every day | ORAL | Status: DC
  Administered 2014-08-08 – 2014-08-09 (×2): 40 mg via ORAL
  Filled 2014-08-08 (×2): qty 1

## 2014-08-08 MED ORDER — ACETAMINOPHEN 650 MG RE SUPP: 650.0000 mg | Freq: Four times a day (QID) | RECTAL | Status: DC | PRN

## 2014-08-08 MED ORDER — BENZONATATE 100 MG PO CAPS: 100.0000 mg | ORAL_CAPSULE | Freq: Three times a day (TID) | ORAL | Status: DC | PRN

## 2014-08-08 MED ORDER — LEVALBUTEROL HCL 0.63 MG/3ML IN NEBU: 0.6300 mg | INHALATION_SOLUTION | Freq: Four times a day (QID) | RESPIRATORY_TRACT | Status: DC | PRN

## 2014-08-08 MED ORDER — GUAIFENESIN ER 600 MG PO TB12
600.0000 mg | ORAL_TABLET | Freq: Two times a day (BID) | ORAL | Status: DC
  Administered 2014-08-08 – 2014-08-09 (×3): 600 mg via ORAL
  Filled 2014-08-08 (×4): qty 1

## 2014-08-08 MED ORDER — ACETAMINOPHEN 325 MG PO TABS: 650.0000 mg | ORAL_TABLET | Freq: Four times a day (QID) | ORAL | Status: DC | PRN

## 2014-08-08 MED ORDER — ONDANSETRON HCL 4 MG PO TABS: 4.0000 mg | ORAL_TABLET | Freq: Four times a day (QID) | ORAL | Status: DC | PRN

## 2014-08-08 MED ORDER — ENSURE COMPLETE PO LIQD
237.0000 mL | Freq: Two times a day (BID) | ORAL | Status: DC
  Administered 2014-08-08 – 2014-08-09 (×2): 237 mL via ORAL

## 2014-08-08 MED ORDER — SIMVASTATIN 20 MG PO TABS
20.0000 mg | ORAL_TABLET | Freq: Every day | ORAL | Status: DC
  Administered 2014-08-08: 20 mg via ORAL
  Filled 2014-08-08 (×2): qty 1

## 2014-08-08 MED ORDER — ACETAMINOPHEN 325 MG PO TABS
650.0000 mg | ORAL_TABLET | Freq: Once | ORAL | Status: AC
  Administered 2014-08-08: 650 mg via ORAL
  Filled 2014-08-08: qty 2

## 2014-08-08 MED ORDER — SODIUM CHLORIDE 0.9 % IV SOLN
INTRAVENOUS | Status: DC
  Administered 2014-08-08: 11:00:00 via INTRAVENOUS

## 2014-08-08 MED ORDER — AMLODIPINE BESYLATE 10 MG PO TABS
10.0000 mg | ORAL_TABLET | Freq: Every day | ORAL | Status: DC
  Administered 2014-08-08 – 2014-08-09 (×2): 10 mg via ORAL
  Filled 2014-08-08 (×2): qty 1

## 2014-08-08 MED ORDER — ONDANSETRON HCL 4 MG/2ML IJ SOLN: 4.0000 mg | Freq: Four times a day (QID) | INTRAMUSCULAR | Status: DC | PRN

## 2014-08-08 MED ORDER — OXYCODONE HCL 5 MG PO TABS: 5.0000 mg | ORAL_TABLET | ORAL | Status: DC | PRN

## 2014-08-08 NOTE — Progress Notes (Addendum)
CSW continuing to follow.  CSW received notification from [facility] that facility was able offer pt a bed.  CSW received notification from [facility] that insurance received request for authorization and pt approved for transfer to [facility] when medically stable.   CSW discussed with pt and pt wife at bedside and they are agreeable to pt discharging to [facility] when pt medically ready for discharge.  CSW updated RN.  CSW to continue to follow.  [name], MSW, LCSW Clinical Social Work Coverage for [employer], [place]

## 2014-08-08 NOTE — Plan of Care (Signed)
Problem: Consults Goal: General Medical Patient Education See Patient Education Module for specific education. Outcome: Completed/Met Date Met:  08/08/14

## 2014-08-08 NOTE — ED Notes (Signed)
Pt did not take any medications at home before calling EMS.

## 2014-08-08 NOTE — ED Notes (Signed)
Per EMS pt from home, was discharged from hospital yesterday, pt is complaining of L sided headache, started about 8 pm yesterday, states while he was in hospital would have these headaches on/off and was given tylenol and that's it, was in hospital for pna, BP 162/80, HR 90's irregular w/ hx. CBG 89.

## 2014-08-08 NOTE — ED Notes (Signed)
Bed: NA35 Expected date:  Expected time:  Means of arrival:  Comments: EMS Headache - seen today and diagnosed with PNA

## 2014-08-08 NOTE — Evaluation (Signed)
Physical Therapy Evaluation Patient Details Name: [name] MRN: [id number] DOB: Mar 08, 1925 Today's Date: 08/08/2014   History of Present Illness  80 y.o. male with past medical history of hypertension, esophageal stricture, dysphagia, diastolic dysfunction admitted 07/31/14 for CAP (community acquired pneumonia) and discharged home however the patient came back after discharge because of weakness and headache and readmitted 08/08/14.  Clinical Impression  Pt admitted with above diagnosis. Pt currently with functional limitations due to the deficits listed below (see PT Problem List).  Pt will benefit from skilled PT to increase their independence and safety with mobility to allow discharge to the venue listed below.  Pt with decreased mobility and increased weakness upon d/c home from recent admission.  Pt agreeable to SNF upon d/c.     Follow Up Recommendations SNF    Equipment Recommendations  None recommended by PT    Recommendations for Other Services       Precautions / Restrictions Precautions Precautions: Fall Precaution Comments: monitor sats Restrictions Weight Bearing Restrictions: No      Mobility  Bed Mobility Overal bed mobility: Needs Assistance Bed Mobility: Supine to Sit     Supine to sit: Min assist     General bed mobility comments: increased time and effort, assist to scoot EOB  Transfers Overall transfer level: Needs assistance Equipment used: Rolling walker (2 wheeled) Transfers: Sit to/from Stand Sit to Stand: Min assist         General transfer comment: assist to rise and steady  Ambulation/Gait Ambulation/Gait assistance: Min assist Ambulation Distance (Feet): 80 Feet Assistive device: Rolling walker (2 wheeled) Gait Pattern/deviations: Step-through pattern;Decreased stride length;Trunk flexed Gait velocity: decr   General Gait Details: remained on 2L O2 [place], assist to steady, headache with ambulation limiting distance as well as  fatigue  Stairs            Wheelchair Mobility    Modified Rankin (Stroke Patients Only)       Balance                                             Pertinent Vitals/Pain Pain Assessment: 0-10 Pain Score: 3  Pain Location: headache Pain Descriptors / Indicators: Aching Pain Intervention(s): Limited activity within patient's tolerance;Monitored during session;Premedicated before session (RN aware)    Home Living Family/patient expects to be discharged to:: Private residence Living Arrangements: Spouse/significant other Available Help at Discharge: Family Type of Home: House Home Access: Stairs to enter Entrance Stairs-Rails: Right Entrance Stairs-Number of Steps: 4 Home Layout: One level Home Equipment: Environmental consultant - 2 wheels;Cane - single point      Prior Function Level of Independence: Independent with assistive device(s)         Comments: since previous admission pt reports increased weakness and decreased mobility, went home with 2L O2 and using RW     Hand Dominance        Extremity/Trunk Assessment               Lower Extremity Assessment: Generalized weakness         Communication   Communication: No difficulties  Cognition Arousal/Alertness: Awake/alert Behavior During Therapy: WFL for tasks assessed/performed Overall Cognitive Status: Within Functional Limits for tasks assessed                      General Comments  Exercises        Assessment/Plan    PT Assessment Patient needs continued PT services  PT Diagnosis Difficulty walking;Generalized weakness   PT Problem List Decreased strength;Decreased activity tolerance;Decreased mobility;Cardiopulmonary status limiting activity;Decreased knowledge of use of DME  PT Treatment Interventions DME instruction;Gait training;Functional mobility training;Patient/family education;Therapeutic activities;Therapeutic exercise   PT Goals (Current goals can be  found in the Care Plan section) Acute Rehab PT Goals PT Goal Formulation: With patient Time For Goal Achievement: 08/22/14 Potential to Achieve Goals: Good    Frequency Min 3X/week   Barriers to discharge        Co-evaluation               End of Session Equipment Utilized During Treatment: Oxygen;Gait belt Activity Tolerance: Patient limited by pain;Patient limited by fatigue Patient left: in chair;with call bell/phone within reach;with family/visitor present Nurse Communication: Mobility status (aware pt up in recliner)    Functional Assessment Tool Used: clinical judgemetn Functional Limitation: Mobility: Walking and moving around Mobility: Walking and Moving Around Current Status (R4163): At least 20 percent but less than 40 percent impaired, limited or restricted Mobility: Walking and Moving Around Goal Status [phone]): At least 1 percent but less than 20 percent impaired, limited or restricted    Time: 1129-1149 PT Time Calculation (min) (ACUTE ONLY): 20 min   Charges:   PT Evaluation $Initial PT Evaluation Tier I: 1 Procedure PT Treatments $Gait Training: 8-22 mins   PT G Codes:   Functional Assessment Tool Used: clinical judgemetn Functional Limitation: Mobility: Walking and moving around    [employer] [facility] 08/08/2014, 12:50 PM Carmelia Bake, PT, DPT 08/08/2014 Pager: [phone]

## 2014-08-08 NOTE — Progress Notes (Addendum)
Clinical Social Work Department CLINICAL SOCIAL WORK PLACEMENT NOTE 08/08/2014  Patient:  [name], [name]  Account Number:  [id number] Admit date:  08/08/2014  Clinical Social Worker:  Maryln Manuel  Date/time:  08/08/2014 02:39 PM  Clinical Social Work is seeking post-discharge placement for this patient at the following level of care:   SKILLED NURSING   (*CSW will update this form in Epic as items are completed)   08/08/2014  Patient/family provided with [place] Department of Clinical Social Work's list of facilities offering this level of care within the geographic area requested by the patient (or if unable, by the patient's family).  08/08/2014  Patient/family informed of their freedom to choose among providers that offer the needed level of care, that participate in Medicare, Medicaid or managed care program needed by the patient, have an available bed and are willing to accept the patient.  08/08/2014  Patient/family informed of [facility]' ownership interest in [facility], as well as of the fact that they are under no obligation to receive care at this facility.  PASARR submitted to EDS on 08/08/2014 PASARR number received on 08/08/2014  FL2 transmitted to all facilities in geographic area requested by pt/family on  08/08/2014 FL2 transmitted to all facilities within larger geographic area on   Patient informed that his/her managed care company has contracts with or will negotiate with  certain facilities, including the following:     Patient/family informed of bed offers received:  08/08/2014 Patient chooses bed at [facility] and [place] recommends and patient chooses bed at    Patient to be transferred to  on   Patient to be transferred to facility by  Patient and family notified of transfer on  Name of family member notified:    The following physician request were entered in Epic:   Additional Comments:   [name],  MSW, [place] Work [phone]

## 2014-08-08 NOTE — ED Notes (Signed)
Attempted to call report to floor, told the RN will call back

## 2014-08-08 NOTE — Progress Notes (Signed)
INITIAL NUTRITION ASSESSMENT  DOCUMENTATION CODES Per approved criteria  -Not Applicable   INTERVENTION: Ensure Complete po BID, each supplement provides 350 kcal and 13 grams of protein  NUTRITION DIAGNOSIS: Inadequate oral intake related to dysphagia and esophageal stricture as evidenced by poor po.   Goal: Pt to meet >/= 90% of their estimated nutrition needs   Monitor:  Weight trend, po intake, acceptance of supplements, labs, I/O's  Reason for Assessment: MST  80 y.o. male  Admitting Dx: <principal problem not specified>  ASSESSMENT: 80 year old male with hypertension, esophageal stricture, dysphagia, diastolic dysfunction presented with cough, shortness of breath, not feeling better in the last few weeks prior to admission on 12/3 . Patient found to have sepsis secondary to community-acquired pneumonia treated with broad-spectrum antibiotics and subsequently discharged home on levofloxacin. Patient had physical therapy evaluation and was deemed appropriate for discharge with home health. The patient came back after discharge because of weakness and headache.  - Pt and family report that pt is eating well and tolerating a soft diet. Per wife, pt's appetite is not as good as normal. Pt follows a soft diet at home and chews his foods well. Pt reports his usual body weight as 145 lbs.  - Pt with mild wasting at the temples.   Height: Ht Readings from Last 1 Encounters:  08/08/14 5\' 5"  (1.651 m)    Weight: Wt Readings from Last 1 Encounters:  08/08/14 146 lb 13.2 oz (66.6 kg)    Ideal Body Weight: 61.5 kg  % Ideal Body Weight: 108%  Wt Readings from Last 10 Encounters:  08/08/14 146 lb 13.2 oz (66.6 kg)  08/07/14 143 lb 1.3 oz (64.9 kg)  05/12/14 146 lb 8 oz (66.452 kg)  01/17/14 139 lb (63.05 kg)  01/09/14 138 lb 12 oz (62.937 kg)  12/27/13 142 lb (64.411 kg)  12/20/13 138 lb 3.2 oz (62.687 kg)  12/10/13 143 lb 12.8 oz (65.227 kg)  11/11/13 146 lb 8 oz (66.452  kg)  01/18/13 143 lb 4 oz (64.978 kg)    Usual Body Weight: 145 lbs  % Usual Body Weight: 101%  BMI:  Body mass index is 24.43 kg/(m^2).  Estimated Nutritional Needs: Kcal: 1700-1900 Protein: 85-95 g Fluid: 1.9 L/day  Skin: Intact  Diet Order: DIET SOFT  EDUCATION NEEDS: -Education needs addressed   Intake/Output Summary (Last 24 hours) at 08/08/14 1509 Last data filed at 08/08/14 1300  Gross per 24 hour  Intake    440 ml  Output      0 ml  Net    440 ml    Last BM: 12/10   Labs:   Recent Labs Lab 08/06/14 0444 08/07/14 0457 08/08/14 0552  NA 129* 129* 129*  K 3.4* 3.6* 4.0  CL 86* 86* 87*  CO2 30 32 29  BUN 13 16 16   CREATININE 1.01 1.24 1.01  CALCIUM 9.0 9.1 9.2  MG  --   --  1.7  GLUCOSE 86 92 106*    CBG (last 3)  No results for input(s): GLUCAP in the last 72 hours.  Scheduled Meds: . amLODipine  10 mg Oral Daily  . aspirin EC  81 mg Oral Daily  . enoxaparin (LOVENOX) injection  40 mg Subcutaneous Q24H  . guaiFENesin  600 mg Oral BID  . levofloxacin  750 mg Oral Daily  . lisinopril  20 mg Oral Daily  . pantoprazole  40 mg Oral Daily  . simvastatin  20 mg Oral q1800  .  sodium chloride  3 mL Intravenous Q12H    Continuous Infusions:   Past Medical History  Diagnosis Date  . Hypertension   . Hyperlipidemia   . Tobacco abuse   . Lung nodule     Left lung, seen 2012, no change in 2012, no follow up needed   . PNA (pneumonia) 2010    Bilateral Pneumonia, COPD 1mm LLL Nodule   . History of ETT 09/1991     POS   . History of MRI 04-22-06    L/S- right hnp  l5/si   . B12 deficiency   . TIA (transient ischemic attack)   . GERD (gastroesophageal reflux disease)   . Esophageal stricture   . Hemorrhoids   . Colon polyps     hyperplastic    Past Surgical History  Procedure Laterality Date  . Cystectomy  1995    Lumbar area     Laurette Schimke MS, RD, LDN

## 2014-08-08 NOTE — Progress Notes (Signed)
Clinical Social Work Department BRIEF PSYCHOSOCIAL ASSESSMENT 08/08/2014  Patient:  [name], [name]     Account Number:  [id number]     Admit date:  08/08/2014  Clinical Social Worker:  Maryln Manuel  Date/Time:  08/08/2014 02:29 PM  Referred by:  Physician  Date Referred:  08/08/2014 Referred for  SNF Placement   Other Referral:   Interview type:  Patient Other interview type:   and patient family at bedside    PSYCHOSOCIAL DATA Living Status:  WIFE Admitted from facility:   Level of care:   Primary support name:  [name] Primary support relationship to patient:  SPOUSE Degree of support available:   strong    CURRENT CONCERNS Current Concerns  Post-Acute Placement   Other Concerns:    SOCIAL WORK ASSESSMENT / PLAN CSW received referral for New SNF. CSW reviewed chart and noted that pt readmitted to hospital after being discharged yesterday. Per PT evaluation, recommendation now for short term rehab at [facility]. Pt has Blue Medicare which would require insurance authorization prior to pt discharge.    CSW met with pt and pt family at bedside. CSW introduced self and explained role. Pt discussed that he lives with pt wife. Pt wife shared that she has difficulty caring for pt at home. CSW discussed with pt and pt spouse recommendation for short term rehab at [facility]. Pt wife shared that she is familiar with [name] as it is close to pt home. CSW discussed that CSW can send information to [facility] in order for facility to review information and notify this CSW if bed available.    CSW completed FL2 and initiated SNF search to [facility]. CSW contacted [facility] and facility stated that they will review information and notify this CSW if facility is able to offer a bed.    CSW sent pt clinicals to [facility] to initiate insurance authorization.    CSW to follow up with pt and pt family regarding SNF bed offers.    CSW to follow up with  [facility] once facility chosen in regard to insurance authorization.    CSW to continue to follow to provide support and assist with pt discharge planning needs.   Assessment/plan status:  Psychosocial Support/Ongoing Assessment of Needs Other assessment/ plan:   discharge planning   Information/referral to community resources:   referral to [facility] at pt and pt family request    PATIENT'S/FAMILY'S RESPONSE TO PLAN OF CARE: Pt alert and oriented x 4. Pt has strong family support. Pt and pt wife recogonize that pt may need rehab at SNF prior to returning home. Pt and pt wife hopeful for [facility].   [name], MSW, LCSW Clinical Social Work Coverage for [employer] [phone]

## 2014-08-08 NOTE — Evaluation (Addendum)
Occupational Therapy Evaluation Patient Details Name: [name] MRN: [id number] DOB: 04-09-1925 Today's Date: 08/08/2014    History of Present Illness 80 y.o. male with past medical history of hypertension, esophageal stricture, dysphagia, diastolic dysfunction admitted 07/31/14 for CAP (community acquired pneumonia) and discharged home however the patient came back after discharge because of weakness and headache and readmitted 08/08/14.   Clinical Impression   Pt was admitted for the above.  Unsure if pt was mod I with adls since last admission--he was prior to this.  Pt needs overall min A for ADLs except LB dressing which is mod A.  He will benefit from skilled OT to increase independence.  Goals are for overall min guard level.    Follow Up Recommendations  SNF    Equipment Recommendations  None recommended by OT    Recommendations for Other Services       Precautions / Restrictions Precautions Precautions: Fall Precaution Comments: monitor sats Restrictions Weight Bearing Restrictions: No      Mobility Bed Mobility Overal bed mobility: Needs Assistance Bed Mobility: Supine to Sit     Supine to sit: Min assist Sit to supine: Min assist   General bed mobility comments: assist for LEs  Transfers Overall transfer level: Needs assistance Equipment used: Rolling walker (2 wheeled) Transfers: Sit to/from Stand Sit to Stand: Min assist         General transfer comment: assist to rise and steady.  Cues given for hand placement; pt initially tried to pull up on walker    Balance                                            ADL Overall ADL's : Needs assistance/impaired     Grooming: Set up;Sitting   Upper Body Bathing: Set up;Sitting   Lower Body Bathing: Minimal assistance;Sit to/from stand   Upper Body Dressing : Set up;Sitting   Lower Body Dressing: Sit to/from stand;Moderate assistance   Toilet Transfer: Minimal  assistance;Stand-pivot (back to bed)   Toileting- Clothing Manipulation and Hygiene: Minimal assistance;Sit to/from stand         General ADL Comments: Pt needs min A for LB for balance and reaching to feet.  Pt was able to doff socks but needed assistance to don  Pt on 2 liters 02; dyspnea 1/4     Vision                     Perception     Praxis      Pertinent Vitals/Pain Pain Assessment: 0-10 Pain Score: 2  Pain Location: head Pain Descriptors / Indicators:  (said difficult to describe) Pain Intervention(s): Limited activity within patient's tolerance;Monitored during session     Hand Dominance     Extremity/Trunk Assessment Upper Extremity Assessment Upper Extremity Assessment: Generalized weakness (grossly 3+ to 4/5)          Communication Communication Communication: No difficulties   Cognition Arousal/Alertness: Awake/alert Behavior During Therapy: WFL for tasks assessed/performed Overall Cognitive Status: Within Functional Limits for tasks assessed--mostly.  Pt initially stated he never went home and then corrected himself.  Cues for safety with RW                     General Comments       Exercises       Shoulder Instructions  Home Living Family/patient expects to be discharged to:: Private residence Living Arrangements: Spouse/significant other Available Help at Discharge: Family Type of Home: House Home Access: Stairs to enter Technical brewer of Steps: 4 Entrance Stairs-Rails: Right Home Layout: One level     Bathroom Shower/Tub: Occupational psychologist: [place]: Bedside commode          Prior Functioning/Environment Level of Independence: Independent with assistive device(s)        Comments: since previous admission pt reports increased weakness and decreased mobility, went home with 2L O2 and using RW    OT Diagnosis: Generalized weakness   OT Problem List: Decreased  strength;Decreased activity tolerance;Impaired balance (sitting and/or standing);Decreased knowledge of use of DME or AE;Pain;Cardiopulmonary status limiting activity   OT Treatment/Interventions: Self-care/ADL training;DME and/or AE instruction;Patient/family education;Balance training;energy conservation   OT Goals(Current goals can be found in the care plan section) Acute Rehab OT Goals Patient Stated Goal: get stronger OT Goal Formulation: With patient Time For Goal Achievement: 08/22/14 Potential to Achieve Goals: Good ADL Goals Pt Will Perform Grooming: with supervision;standing Pt Will Perform Lower Body Bathing: with min guard assist;sit to/from stand;with adaptive equipment Pt Will Perform Lower Body Dressing: with min guard assist;with adaptive equipment;sit to/from stand Pt Will Transfer to Toilet: with min guard assist;ambulating;bedside commode Pt Will Perform Toileting - Clothing Manipulation and hygiene: with min guard assist;sit to/from stand  OT Frequency: Min 2X/week   Barriers to D/C:            Co-evaluation              End of Session Nurse Communication:  (catheter came off)  Activity Tolerance: Patient tolerated treatment well Patient left: in bed;with call bell/phone within reach;with nursing/sitter in room   Time: 1530-1544 OT Time Calculation (min): 14 min Charges:  OT General Charges $OT Visit: 1 Procedure OT Evaluation $Initial OT Evaluation Tier I: 1 Procedure OT Treatments $Self Care/Home Management : 8-22 mins G-Codes: OT G-codes **NOT FOR INPATIENT CLASS** Functional Assessment Tool Used: clinical judgment and observation Functional Limitation: Self care Self Care Current Status (F0263): At least 20 percent but less than 40 percent impaired, limited or restricted Self Care Goal Status (Z8588): At least 1 percent but less than 20 percent impaired, limited or restricted  Sophiarose Eades 08/08/2014, 4:08 PM   Lesle Chris,  OTR/L [phone] 08/08/2014

## 2014-08-08 NOTE — Progress Notes (Signed)
CARE MANAGEMENT NOTE 08/08/2014  Patient:  [name], [name]   Account Number:  [id number]  Date Initiated:  08/08/2014  Documentation initiated by:  Karl Bales  Subjective/Objective Assessment:   pt readmitted with weakness, SOB, cough     Action/Plan:   from home/plan to go to SNF [name]   Anticipated DC Date:  08/11/2014   Anticipated DC Plan:  [name]  In-house referral  Clinical Social Worker      DC Planning Services  CM consult      Choice offered to / List presented to:             Status of service:  In process, will continue to follow Medicare Important Message given?  YES (If response is "NO", the following Medicare IM given date fields will be blank) Date Medicare IM given:  08/08/2014 Medicare IM given by:  Karl Bales Date Additional Medicare IM given:   Additional Medicare IM given by:    Discharge Disposition:  [place]  Per UR Regulation:  Reviewed for med. necessity/level of care/duration of stay  If discussed at [place] of Stay Meetings, dates discussed:    Comments:  08/08/14 MMcGibboney, RN, BSN Spoke with pt and wife concerning discharge plans. Both Pt and wife request [facility]. CSW will follow.

## 2014-08-08 NOTE — ED Provider Notes (Signed)
CSN: [id number]     Arrival date & time 08/08/14  0401 History   First MD Initiated Contact with Patient 08/08/14 [id number]     Chief Complaint  Patient presents with  . Headache     (Consider location/radiation/quality/duration/timing/severity/associated sxs/prior Treatment) HPI 80 year old male presents emergency department via EMS from home with complaint of left-sided headache.  Patient was discharged yesterday after 7 day admission for pneumonia.  Patient and wife report starting yesterday.  Patient began having left-sided headache.  Therefore, it would get better briefly with Tylenol and then return.  Patient was set up with home health, oxygen.  Wife reports that she did not expect him to be quite so weak when he returned home and she is having some difficulties taking care of him.  Patient appears confused and is unsure why exactly he is here.  He complains of headache and then denies it soon after.  No fevers or chills, no neck stiffness, no weakness or numbness.  Patient has had persistent cough and is still requiring oxygen.  Wife reports she has noticed confusion over the last week intermittently Past Medical History  Diagnosis Date  . Hypertension   . Hyperlipidemia   . Tobacco abuse   . Lung nodule     Left lung, seen 2012, no change in 2012, no follow up needed   . PNA (pneumonia) 2010    Bilateral Pneumonia, COPD 59mm LLL Nodule   . History of ETT 09/1991     POS   . History of MRI 04-22-06    L/S- right hnp  l5/si   . B12 deficiency   . TIA (transient ischemic attack)   . GERD (gastroesophageal reflux disease)   . Esophageal stricture   . Hemorrhoids   . Colon polyps     hyperplastic   Past Surgical History  Procedure Laterality Date  . Cystectomy  1995    Lumbar area    Family History  Problem Relation Age of Onset  . Hypertension Mother   . Heart attack Father   . Colon cancer Neg Hx   . Esophageal cancer Neg Hx    History  Substance Use Topics  . Smoking  status: Former Smoker    Types: Cigarettes    Quit date: 08/29/1985  . Smokeless tobacco: Former Systems developer    Types: Chew  . Alcohol Use: No    Review of Systems  Constitutional: Positive for fatigue.  Respiratory: Positive for cough and shortness of breath.   Neurological: Positive for weakness and headaches.  All other systems reviewed and are negative.     Allergies  Review of patient's allergies indicates no known allergies.  Home Medications   Prior to Admission medications   Medication Sig Start Date End Date Taking? Authorizing Provider  albuterol (PROVENTIL HFA;VENTOLIN HFA) 108 (90 BASE) MCG/ACT inhaler Inhale 2 puffs into the lungs every 6 (six) hours as needed for wheezing or shortness of breath. 12/20/13   Costin Karlyne Greenspan, MD  amLODipine (NORVASC) 10 MG tablet Take 1 tablet (10 mg total) by mouth daily. 07/22/14   Tonia Ghent, MD  aspirin EC 81 MG tablet Take 81 mg by mouth daily.    Historical Provider, MD  benzonatate (TESSALON PERLES) 100 MG capsule Take 1 capsule (100 mg total) by mouth 3 (three) times daily as needed for cough. 08/07/14   Ripudeep Krystal Eaton, MD  fluorouracil (EFUDEX) 5 % cream Apply 1 application topically daily.  08/04/11   Historical Provider, MD  guaiFENesin (MUCINEX) 600 MG 12 hr tablet Take 1 tablet (600 mg total) by mouth 2 (two) times daily. 08/07/14   Ripudeep Krystal Eaton, MD  levofloxacin (LEVAQUIN) 750 MG tablet Take 1 tablet (750 mg total) by mouth daily. X 5 more days 08/07/14   Ripudeep Krystal Eaton, MD  lisinopril (PRINIVIL,ZESTRIL) 20 MG tablet Take 1 tablet (20 mg total) by mouth daily. 07/22/14   Tonia Ghent, MD  Multiple Vitamins-Minerals (PRESERVISION AREDS 2) CAPS Take as directed. 03/16/14   Tonia Ghent, MD  pantoprazole (PROTONIX) 40 MG tablet Take 1 tablet (40 mg total) by mouth daily. 08/07/14   Ripudeep Krystal Eaton, MD  simvastatin (ZOCOR) 20 MG tablet Take 1 tab by mouth at night 03/04/14   Tonia Ghent, MD   BP 163/78 mmHg  Pulse 56   Temp(Src) 97.9 F (36.6 C) (Rectal)  Resp 22  SpO2 88% Physical Exam  Constitutional: He appears well-developed and well-nourished. No distress.  HENT:  Head: Normocephalic and atraumatic.  Right Ear: External ear normal.  Left Ear: External ear normal.  Nose: Nose normal.  Mouth/Throat: Oropharynx is clear and moist.  Eyes: Conjunctivae and EOM are normal. Pupils are equal, round, and reactive to light.  Neck: Normal range of motion. Neck supple. No JVD present. No tracheal deviation present. No thyromegaly present.  Cardiovascular: Normal rate, regular rhythm, normal heart sounds and intact distal pulses.  Exam reveals no gallop and no friction rub.   No murmur heard. Pulmonary/Chest: Effort normal and breath sounds normal. No stridor. No respiratory distress. He has no wheezes. He has no rales. He exhibits no tenderness.  Abdominal: Soft. Bowel sounds are normal. He exhibits no distension and no mass. There is no tenderness. There is no rebound and no guarding.  Musculoskeletal: Normal range of motion. He exhibits no edema or tenderness.  Lymphadenopathy:    He has no cervical adenopathy.  Neurological: He is alert. He displays normal reflexes. No cranial nerve deficit. He exhibits normal muscle tone. Coordination normal.  Oriented to self and that he is at [facility], is unsure of the month and year.  Skin: Skin is warm and dry. No rash noted. No erythema. No pallor.  Psychiatric: He has a normal mood and affect. His behavior is normal. Judgment and thought content normal.  Nursing note and vitals reviewed.   ED Course  Procedures (including critical care time) Labs Review Labs Reviewed - No data to display  Imaging Review Ct Head Wo Contrast  08/08/2014   CLINICAL DATA:  Initial evaluation for acute left-sided headache.  EXAM: CT HEAD WITHOUT CONTRAST  TECHNIQUE: Contiguous axial images were obtained from the base of the skull through the vertex without intravenous  contrast.  COMPARISON:  Prior CT from 03/16/2011 and MRI from 03/16/2011.  FINDINGS: Diffuse prominence of the CSF containing spaces is compatible with generalized age-related cerebral atrophy. Patchy and confluent hypodensity within the periventricular and deep white matter both cerebral hemispheres most consistent with chronic small vessel ischemic disease. These changes are progressed relative to 2012. Probable small remote lacunar infarct present within the basal ganglia bilaterally.  There is no acute intracranial hemorrhage or infarct. No mass lesion or midline shift. Gray-white matter differentiation is well maintained. Ventricles are normal in size without evidence of hydrocephalus. CSF containing spaces are within normal limits. No extra-axial fluid collection.  The calvarium is intact.  Orbital soft tissues are within normal limits.  The paranasal sinuses and mastoid air cells are well  pneumatized and free of fluid.  Scalp soft tissues are unremarkable.  IMPRESSION: 1. No acute intracranial process. 2. Atrophy with moderate chronic microvascular ischemic disease, advanced relative to 2012.   Electronically Signed   By: Jeannine Boga M.D.   On: 08/08/2014 05:02     EKG Interpretation None      MDM   Final diagnoses:  Weakness  Bad headache   80 year old male with intermittent headache for the last day and a half.  Given age and severity of headache, we'll get head CT.  Will give Tylenol as that is helped in the past.  5:31 AM Patient reports feeling much better after the Tylenol.  In discussion with the wife, she feels that patient is much too weak for her to care for by herself at home.  Home health is set up oxygen, and is planning to come back home, but she feels like patient needs round-the-clock care that she is unable to offer him.  He is too weak to support his own weight and ambulate to the bathroom.  I will have nurse and tach attempt to ambulate the patient, if he is having  difficulties getting around, will discuss with hospitalist for readmission back to the hospital and rehabilitation stay.  Kalman Drape, MD 08/08/14 [phone]

## 2014-08-08 NOTE — H&P (Addendum)
[facility] History and Physical  [name] IRC:[id number] DOB: 1925/01/13 DOA: 08/08/2014  Referring physician:  PCP: [name], MD   Chief Complaint: Weakness HPI:  80 year old male with hypertension, esophageal stricture, dysphagia, diastolic dysfunction presented with cough, shortness of breath, not feeling better in the last few weeks prior to admission on 12/3 . Patient found to have sepsis secondary to community-acquired pneumonia treated with broad-spectrum antibiotics and subsequently discharged home on levofloxacin. Patient had physical therapy evaluation and was deemed appropriate for discharge with home health. The patient came back after discharge because of weakness and headache.. Headache is described as left-sided patient has had this on and off during the last 7 days that he was hospitalized. Headache with this one well to Tylenol. No fevers or chills, no neck stiffness, no weakness or numbness. Patient has had persistent cough and is still requiring oxygen. Wife reports she has noticed confusion over the last week intermittently. CT of the head found to be negative. In discussion with the wife, she feels that patient is much too weak for her to care for by herself at home. Home health is set up oxygen, and is planning to come back home, but she feels like patient needs round-the-clock care that she is unable to offer him. He is too weak to support his own weight and ambulate to the bathroom.Patient readmitted for further evaluation possible SNF placement.   Review of Systems  Constitutional: Positive for fatigue.  Respiratory: Positive for cough and shortness of breath.  Neurological: Positive for weakness and headaches.  All other systems reviewed and are negative.   Past Medical History  Diagnosis Date  . Hypertension   . Hyperlipidemia   . Tobacco abuse   . Lung nodule     Left lung, seen 2012, no change in 2012, no follow up needed   . PNA  (pneumonia) 2010    Bilateral Pneumonia, COPD 51mm LLL Nodule   . History of ETT 09/1991     POS   . History of MRI 04-22-06    L/S- right hnp  l5/si   . B12 deficiency   . TIA (transient ischemic attack)   . GERD (gastroesophageal reflux disease)   . Esophageal stricture   . Hemorrhoids   . Colon polyps     hyperplastic     Past Surgical History  Procedure Laterality Date  . Cystectomy  1995    Lumbar area       Social History:  reports that he quit smoking about 28 years ago. His smoking use included Cigarettes. He smoked 0.00 packs per day. He has quit using smokeless tobacco. His smokeless tobacco use included Chew. He reports that he does not drink alcohol or use illicit drugs.    No Known Allergies  Family History  Problem Relation Age of Onset  . Hypertension Mother   . Heart attack Father   . Colon cancer Neg Hx   . Esophageal cancer Neg Hx      Prior to Admission medications   Medication Sig Start Date End Date Taking? Authorizing Provider  albuterol (PROVENTIL HFA;VENTOLIN HFA) 108 (90 BASE) MCG/ACT inhaler Inhale 2 puffs into the lungs every 6 (six) hours as needed for wheezing or shortness of breath. 12/20/13  Yes Costin Karlyne Greenspan, MD  amLODipine (NORVASC) 10 MG tablet Take 1 tablet (10 mg total) by mouth daily. 07/22/14  Yes Tonia Ghent, MD  aspirin EC 81 MG tablet Take 81 mg by mouth daily.  Yes Historical Provider, MD  benzonatate (TESSALON PERLES) 100 MG capsule Take 1 capsule (100 mg total) by mouth 3 (three) times daily as needed for cough. 08/07/14  Yes Ripudeep Krystal Eaton, MD  fluorouracil (EFUDEX) 5 % cream Apply 1 application topically daily.  08/04/11  Yes Historical Provider, MD  guaiFENesin (MUCINEX) 600 MG 12 hr tablet Take 1 tablet (600 mg total) by mouth 2 (two) times daily. 08/07/14  Yes Ripudeep Krystal Eaton, MD  hydrochlorothiazide (HYDRODIURIL) 25 MG tablet Take 25 mg by mouth daily.  08/07/14  Yes Historical Provider, MD  levofloxacin (LEVAQUIN) 750  MG tablet Take 1 tablet (750 mg total) by mouth daily. X 5 more days 08/07/14  Yes Ripudeep K Rai, MD  lisinopril (PRINIVIL,ZESTRIL) 20 MG tablet Take 1 tablet (20 mg total) by mouth daily. 07/22/14  Yes Tonia Ghent, MD  Multiple Vitamins-Minerals (PRESERVISION AREDS 2) CAPS Take as directed. Patient taking differently: Take 1 tablet by mouth daily. Take as directed. 03/16/14  Yes Tonia Ghent, MD  pantoprazole (PROTONIX) 40 MG tablet Take 1 tablet (40 mg total) by mouth daily. 08/07/14  Yes Ripudeep Krystal Eaton, MD  simvastatin (ZOCOR) 20 MG tablet Take 1 tab by mouth at night Patient taking differently: Take 20 mg by mouth daily at 6 PM.  03/04/14  Yes Tonia Ghent, MD     Physical Exam: Filed Vitals:   08/08/14 0600 08/08/14 0615 08/08/14 0709 08/08/14 1045  BP: 167/64 167/64 156/67 146/67  Pulse: 48 48 78 87  Temp:   97.7 F (36.5 C) 98.2 F (36.8 C)  TempSrc:   Oral Oral  Resp:  18 16 16   Height:   5\' 5"  (1.651 m)   Weight:   66.6 kg (146 lb 13.2 oz)   SpO2: 99% 99% 98% 99%     Constitutional: Vital signs reviewed. Patient is a well-developed and well-nourished in no acute distress and cooperative with exam. Alert and oriented x3.  Head: Normocephalic and atraumatic  Ear: TM normal bilaterally  Mouth: no erythema or exudates, MMM  Eyes: PERRL, EOMI, conjunctivae normal, No scleral icterus.  Neck: Supple, Trachea midline normal ROM, No JVD, mass, thyromegaly, or carotid bruit present.  Cardiovascular: RRR, S1 normal, S2 normal, no MRG, pulses symmetric and intact bilaterally  Pulmonary/Chest: CTAB, no wheezes, rales, or rhonchi  Abdominal: Soft. Non-tender, non-distended, bowel sounds are normal, no masses, organomegaly, or guarding present.  GU: no CVA tenderness Musculoskeletal: No joint deformities, erythema, or stiffness, ROM full and no nontender Ext: no edema and no cyanosis, pulses palpable bilaterally (DP and PT)  Hematology: no cervical, inginal, or axillary  adenopathy.  Neurological: A&O xOriented to self and that he is at [facility], is unsure of the month and year.  Skin: Warm, dry and intact. No rash, cyanosis, or clubbing.  Psychiatric: Normal mood and affect. speech and behavior is normal. Judgment and thought content normal. Cognition and memory are normal.       Labs on Admission:    Basic Metabolic Panel:  Recent Labs Lab 08/04/14 0416 08/05/14 0433 08/06/14 0444 08/07/14 0457 08/08/14 0552  NA 136* 132* 129* 129* 129*  K 3.9 4.3 3.4* 3.6* 4.0  CL 93* 91* 86* 86* 87*  CO2 30 32 30 32 29  GLUCOSE 122* 99 86 92 106*  BUN 13 11 13 16 16   CREATININE 1.09 1.16 1.01 1.24 1.01  CALCIUM 9.0 9.2 9.0 9.1 9.2  MG  --   --   --   --  1.7   Liver Function Tests:  Recent Labs Lab 08/08/14 0552  AST 25  ALT 32  ALKPHOS 70  BILITOT 0.3  PROT 7.1  ALBUMIN 2.7*   No results for input(s): LIPASE, AMYLASE in the last 168 hours. No results for input(s): AMMONIA in the last 168 hours. CBC:  Recent Labs Lab 08/03/14 0400 08/04/14 0416 08/05/14 0433 08/06/14 0444 08/08/14 0552  WBC 16.6* 11.4* 8.5 10.2 10.1  HGB 11.0* 10.2* 10.2* 10.5* 11.1*  HCT 35.4* 33.0* 32.1* 33.1* 33.8*  MCV 86.3 86.8 83.6 83.8 83.3  PLT 240 226 273 278 362   Cardiac Enzymes:  Recent Labs Lab 08/08/14 0552  CKTOTAL 78    BNP (last 3 results)  Recent Labs  12/17/13 0422 01/09/14 1250 07/31/14 2154  PROBNP 1657.0* 131.0* 657.5*      CBG: No results for input(s): GLUCAP in the last 168 hours.  Radiological Exams on Admission: Ct Head Wo Contrast  08/08/2014   CLINICAL DATA:  Initial evaluation for acute left-sided headache.  EXAM: CT HEAD WITHOUT CONTRAST  TECHNIQUE: Contiguous axial images were obtained from the base of the skull through the vertex without intravenous contrast.  COMPARISON:  Prior CT from 03/16/2011 and MRI from 03/16/2011.  FINDINGS: Diffuse prominence of the CSF containing spaces is compatible with  generalized age-related cerebral atrophy. Patchy and confluent hypodensity within the periventricular and deep white matter both cerebral hemispheres most consistent with chronic small vessel ischemic disease. These changes are progressed relative to 2012. Probable small remote lacunar infarct present within the basal ganglia bilaterally.  There is no acute intracranial hemorrhage or infarct. No mass lesion or midline shift. Gray-white matter differentiation is well maintained. Ventricles are normal in size without evidence of hydrocephalus. CSF containing spaces are within normal limits. No extra-axial fluid collection.  The calvarium is intact.  Orbital soft tissues are within normal limits.  The paranasal sinuses and mastoid air cells are well pneumatized and free of fluid.  Scalp soft tissues are unremarkable.  IMPRESSION: 1. No acute intracranial process. 2. Atrophy with moderate chronic microvascular ischemic disease, advanced relative to 2012.   Electronically Signed   By: Jeannine Boga M.D.   On: 08/08/2014 05:02    EKG: Independently reviewed.  Assessment/Plan Active Problems:   Weakness  Sepsis secondary to community acquired pneumonia versus aspiration pneumonia Patient was admitted, chest x-ray showed possible left-sided infiltrate. Treated with Zithromax and Zosyn. Vancomycin was discontinued on 12/5 given clinical improvement. Subsequently discharged home on levofloxacin. Blood cultures has remained negative, sputum culture negative, rapid flu, urine for strep antigen, respiratory panel and legionella antigens have been negative. Home O2 eval was done and recommended 2L upon ambulation. Home oxygen was set up No pulmonary complaints  Dysphagia: Patient has a history of esophageal stricture, was planning to have repeat endoscopy earlier this year due to recurrent dysphagia. Esophagogram done this admission showed stricture of a diameter of 1.1 cm. Gastroenterology was consulted.  Patient was seen by Dr. Henrene Pastor who felt that good portion of his problem is dysmotility given the lack of response to relatively recent large caliber esophageal dilation. He did not feel that patient is an appropriate candidate for sedation and repeat therapeutic dilation. Patient was recommended to continue with current soft diet.  Atrial fibrillation with RVR: Paroxysmal Possibly was triggered by pneumonia, currently normal sinus rhythm, patient did require Cardizem drip on during last admission.Marland Kitchen He was placed on aspirin. Thyroid function tests normal.  Chronic diastolic dysfunction, grade 1 without  exacerbation Once oral intake was adequate with good urine output, hydration was stopped, no signs of pulmonary edema  Essential hypertension BP is currently stable, hydrochlorothiazide was discontinued due to hyponatremia during last admission  Hyponatremia attributed to dehydration Placed the patient on fluid restriction Discontinued HCTZ, Follow BMP  Acute kidney injury-resolved Creatinine stable at 1  Weakness Could be secondary to hyponatremia PT/OT eval No gross focal neurologic deficits Doubt further neurologic workup is needed    Code Status:   full Family Communication: bedside Disposition Plan: admit   Time spent: 70 mins   [facility] Pager [phone]  If 7PM-7AM, please contact night-coverage [email or web address] Password [email or web address] 08/08/2014, 11:14 AM

## 2014-08-09 DIAGNOSIS — I10 Essential (primary) hypertension: Secondary | ICD-10-CM

## 2014-08-09 DIAGNOSIS — K209 Esophagitis, unspecified: Secondary | ICD-10-CM

## 2014-08-09 DIAGNOSIS — E780 Pure hypercholesterolemia: Secondary | ICD-10-CM

## 2014-08-09 LAB — COMPREHENSIVE METABOLIC PANEL
ALT: 29 U/L (ref 0–53)
AST: 25 U/L (ref 0–37)
Albumin: 2.7 g/dL — ABNORMAL LOW (ref 3.5–5.2)
Alkaline Phosphatase: 69 U/L (ref 39–117)
Anion gap: 12 (ref 5–15)
BILIRUBIN TOTAL: 0.2 mg/dL — AB (ref 0.3–1.2)
BUN: 15 mg/dL (ref 6–23)
CHLORIDE: 90 meq/L — AB (ref 96–112)
CO2: 29 meq/L (ref 19–32)
Calcium: 9.4 mg/dL (ref 8.4–10.5)
Creatinine, Ser: 1.07 mg/dL (ref 0.50–1.35)
GFR calc Af Amer: 70 mL/min — ABNORMAL LOW (ref >90)
GFR, EST NON AFRICAN AMERICAN: 60 mL/min — AB (ref >90)
Glucose, Bld: 95 mg/dL (ref 70–99)
Potassium: 4 mEq/L (ref 3.7–5.3)
SODIUM: 131 meq/L — AB (ref 137–147)
Total Protein: 6.7 g/dL (ref 6.0–8.3)

## 2014-08-09 LAB — SEDIMENTATION RATE: Sed Rate: 63 mm/hr — ABNORMAL HIGH (ref 0–16)

## 2014-08-09 LAB — CBC
HCT: 34.9 % — ABNORMAL LOW (ref 39.0–52.0)
Hemoglobin: 11.3 g/dL — ABNORMAL LOW (ref 13.0–17.0)
MCH: 27.2 pg (ref 26.0–34.0)
MCHC: 32.4 g/dL (ref 30.0–36.0)
MCV: 83.9 fL (ref 78.0–100.0)
PLATELETS: 383 10*3/uL (ref 150–400)
RBC: 4.16 MIL/uL — AB (ref 4.22–5.81)
RDW: 14 % (ref 11.5–15.5)
WBC: 10 10*3/uL (ref 4.0–10.5)

## 2014-08-09 MED ORDER — OXYCODONE HCL 5 MG PO TABS: 5.0000 mg | ORAL_TABLET | Freq: Four times a day (QID) | ORAL | Status: DC | PRN

## 2014-08-09 MED ORDER — ENSURE COMPLETE PO LIQD: 237.0000 mL | Freq: Two times a day (BID) | ORAL | Status: DC

## 2014-08-09 MED ORDER — LEVOFLOXACIN 750 MG PO TABS: 750.0000 mg | ORAL_TABLET | Freq: Every day | ORAL | Status: AC

## 2014-08-09 MED ORDER — VERAPAMIL HCL 40 MG PO TABS: 80.0000 mg | ORAL_TABLET | Freq: Three times a day (TID) | ORAL | Status: DC

## 2014-08-09 MED ORDER — LEVOFLOXACIN 750 MG PO TABS: 750.0000 mg | ORAL_TABLET | Freq: Every day | ORAL | Status: DC

## 2014-08-09 NOTE — Clinical Social Work Placement (Signed)
Clinical Social Work Department CLINICAL SOCIAL WORK PLACEMENT NOTE 08/09/2014  Patient:  [name], [name]  Account Number:  [id number] Admit date:  08/08/2014  Clinical Social Worker:  Maryln Manuel  Date/time:  08/08/2014 02:39 PM  Clinical Social Work is seeking post-discharge placement for this patient at the following level of care:   SKILLED NURSING   (*CSW will update this form in Epic as items are completed)   08/08/2014  Patient/family provided with [place] Department of Clinical Social Work's list of facilities offering this level of care within the geographic area requested by the patient (or if unable, by the patient's family).  08/08/2014  Patient/family informed of their freedom to choose among providers that offer the needed level of care, that participate in Medicare, Medicaid or managed care program needed by the patient, have an available bed and are willing to accept the patient.  08/08/2014  Patient/family informed of [facility]' ownership interest in [facility], as well as of the fact that they are under no obligation to receive care at this facility.  PASARR submitted to EDS on 08/08/2014 PASARR number received on 08/08/2014  FL2 transmitted to all facilities in geographic area requested by pt/family on  08/08/2014 FL2 transmitted to all facilities within larger geographic area on   Patient informed that his/her managed care company has contracts with or will negotiate with  certain facilities, including the following:     Patient/family informed of bed offers received:  08/08/2014 Patient chooses bed at [place] Physician recommends and patient chooses bed at    Patient to be transferred to [place] on  08/09/2014 Patient to be transferred to facility by ambulance Patient and family notified of transfer on 08/09/2014 Name of family member notified:  Dub Mikes wife  The following physician request were entered in  Epic:   Additional Comments:  .Dede Query, [place] Worker - Weekend Coverage cell #: [phone]

## 2014-08-09 NOTE — Discharge Summary (Addendum)
Physician Discharge Summary  [name] MRN: [id number] DOB/AGE: 01-Jul-1925 80 y.o.  PCP: [name], MD   Admit date: 08/08/2014 Discharge date: 08/09/2014  Discharge Diagnoses:   . CAP (community acquired pneumonia) . Acute respiratory failure  . Atrial fibrillation with RVR . Diastolic dysfunction . Essential hypertension . Lactic acidosis . Esophageal dysmotility  Hyponatremia   Consults: Gastroenterology   Recommendations for Outpatient Follow-up:  Recommend repeating chest x-ray in 2-3 weeks to ensure complete resolution of pneumonia  Please note HCTZ has been discontinued due to hyponatremia. Recommend checking BMET at the time of follow-up appointment.       Medication List    STOP taking these medications        hydrochlorothiazide 25 MG tablet  Commonly known as:  HYDRODIURIL      TAKE these medications        albuterol 108 (90 BASE) MCG/ACT inhaler  Commonly known as:  PROVENTIL HFA;VENTOLIN HFA  Inhale 2 puffs into the lungs every 6 (six) hours as needed for wheezing or shortness of breath.     amLODipine 10 MG tablet  Commonly known as:  NORVASC  Take 1 tablet (10 mg total) by mouth daily.     aspirin EC 81 MG tablet  Take 81 mg by mouth daily.     benzonatate 100 MG capsule  Commonly known as:  TESSALON PERLES  Take 1 capsule (100 mg total) by mouth 3 (three) times daily as needed for cough.     feeding supplement (ENSURE COMPLETE) Liqd  Take 237 mLs by mouth 2 (two) times daily between meals.     fluorouracil 5 % cream  Commonly known as:  EFUDEX  Apply 1 application topically daily.     guaiFENesin 600 MG 12 hr tablet  Commonly known as:  MUCINEX  Take 1 tablet (600 mg total) by mouth 2 (two) times daily.     levofloxacin 750 MG tablet  Commonly known as:  LEVAQUIN  Take 1 tablet (750 mg total) by mouth daily. X 5 more days     lisinopril 20 MG tablet  Commonly known as:  PRINIVIL,ZESTRIL  Take 1 tablet (20 mg  total) by mouth daily.     oxyCODONE 5 MG immediate release tablet  Commonly known as:  Oxy IR/ROXICODONE  Take 1 tablet (5 mg total) by mouth every 6 (six) hours as needed for moderate pain.     pantoprazole 40 MG tablet  Commonly known as:  PROTONIX  Take 1 tablet (40 mg total) by mouth daily.     PRESERVISION AREDS 2 Caps  Take as directed.     simvastatin 20 MG tablet  Commonly known as:  ZOCOR  Take 1 tab by mouth at night       verapamil 40 mg 3 times a day    Discharge Condition:stable   Disposition: 06-Home-Health Care Svc   Consults: none   Significant Diagnostic Studies: Ct Head Wo Contrast  08/08/2014   CLINICAL DATA:  Initial evaluation for acute left-sided headache.  EXAM: CT HEAD WITHOUT CONTRAST  TECHNIQUE: Contiguous axial images were obtained from the base of the skull through the vertex without intravenous contrast.  COMPARISON:  Prior CT from 03/16/2011 and MRI from 03/16/2011.  FINDINGS: Diffuse prominence of the CSF containing spaces is compatible with generalized age-related cerebral atrophy. Patchy and confluent hypodensity within the periventricular and deep white matter both cerebral hemispheres most consistent with chronic small vessel ischemic disease. These changes are  progressed relative to 2012. Probable small remote lacunar infarct present within the basal ganglia bilaterally.  There is no acute intracranial hemorrhage or infarct. No mass lesion or midline shift. Gray-white matter differentiation is well maintained. Ventricles are normal in size without evidence of hydrocephalus. CSF containing spaces are within normal limits. No extra-axial fluid collection.  The calvarium is intact.  Orbital soft tissues are within normal limits.  The paranasal sinuses and mastoid air cells are well pneumatized and free of fluid.  Scalp soft tissues are unremarkable.  IMPRESSION: 1. No acute intracranial process. 2. Atrophy with moderate chronic microvascular ischemic  disease, advanced relative to 2012.   Electronically Signed   By: Jeannine Boga M.D.   On: 08/08/2014 05:02   Dg Esophagus  08/04/2014   CLINICAL DATA:  Dysphagia.  Regurgitation.  Esophagitis.  EXAM: ESOPHOGRAM/BARIUM SWALLOW  TECHNIQUE: Single contrast examination was performed using thin barium. A barium pill was also administered orally.  FLUOROSCOPY TIME:  1 min, 23 seconds  COMPARISON:  None.  FINDINGS: The patient has limited mobility in the exam was performed with patient lying supine on the table, sipping from a straw.  Pharyngeal phase of swallowing was not assessed due to difficulty with patient positioning.  Primary peristaltic waves in the esophagus were disrupted on 3 out of 4 swallows, with secondary contractions and periodic tertiary contractions observed.  The maximum distention of the distal esophagus was 1.1 cm due to smooth distal narrowing. I am uncertain if this represents a true smooth stricture or is simply due to the underlying dysmotility, but a 13 mm barium tablet did not pass through this part of the esophagus, and accordingly I am inclined to believe that this is a true smooth stricturing.  IMPRESSION: 1. Suspected smooth stricturing of the distal esophagus, narrowed down to 1.1 cm. A 13 mm barium tablet did not pass this region. 2. Nonspecific esophageal dysmotility disorder. 3. There were limitations for today's exam, as the patient had very limited mobility. The normal esophagram involves standing, turning, and a variety of maneuvers requiring good mobility. Today's exam was performed with the patient lying down the prone position. This lowers diagnostic sensitivity and specificity.   Electronically Signed   By: Sherryl Barters M.D.   On: 08/04/2014 15:22   Dg Chest Port 1 View  08/02/2014   CLINICAL DATA:  Hypoxia.  Initial encounter.  EXAM: PORTABLE CHEST - 1 VIEW  COMPARISON:  Radiographs 07/31/2014 and 01/17/2014. Chest CT 02/09/2009.  FINDINGS: 1112 hr. The heart  size and mediastinal contours are stable. There are increased left-greater-than-right basilar airspace opacities superimposed on chronic emphysema. No significant pleural effusion is demonstrated. Sclerotic density overlapping the anterior aspect of the left third rib corresponds with a bone island on prior CT. No acute osseous findings identified.  IMPRESSION: Increased left greater than right basilar airspace opacities suspicious for pneumonia superimposed on chronic lung disease. No consolidation or significant pleural effusion.   Electronically Signed   By: Camie Patience M.D.   On: 08/02/2014 12:21   Dg Chest Port 1 View  08/01/2014   CLINICAL DATA:  This exam identified as missing a report at 1303 hr on 08/01/2014.  80 year old male with shortness of breath, respiratory distress, nausea vomiting, chills. Initial encounter.  EXAM: PORTABLE CHEST - 1 VIEW  COMPARISON:  01/17/2014 and earlier.  FINDINGS: Portable AP semi upright view at 2205 hr on 07/31/2014. Stable cardiac size and mediastinal contours. Stable lung volumes. No pneumothorax or pulmonary edema. No pleural  effusion. Chronic increased reticular opacity at both lung bases. Two more nodular areas of opacity off the left hilum appear to be new. Visualized tracheal air column is within normal limits.  IMPRESSION: Chronic lung disease with 2 small new nodular opacities about the left hilum, could reflect developing infection.   Electronically Signed   By: Lars Pinks M.D.   On: 08/01/2014 13:07      Microbiology: Recent Results (from the past 240 hour(s))  Culture, blood (routine x 2) Call MD if unable to obtain prior to antibiotics being given     Status: None   Collection Time: 08/01/14 12:43 AM  Result Value Ref Range Status   Specimen Description BLOOD RIGHT HAND  Final   Special Requests BOTTLES DRAWN AEROBIC ONLY 3CC  Final   Culture  Setup Time   Final    08/01/2014 03:23 Performed at [facility]    Culture   Final    NO  GROWTH 5 DAYS Performed at [facility]    Report Status 08/07/2014 FINAL  Final  Culture, blood (routine x 2) Call MD if unable to obtain prior to antibiotics being given     Status: None   Collection Time: 08/01/14 12:43 AM  Result Value Ref Range Status   Specimen Description BLOOD LEFT FOREARM  Final   Special Requests BOTTLES DRAWN AEROBIC AND ANAEROBIC 5CC  Final   Culture  Setup Time   Final    08/01/2014 03:22 Performed at [facility]    Culture   Final    NO GROWTH 5 DAYS Performed at [facility]    Report Status 08/07/2014 FINAL  Final  MRSA PCR Screening     Status: None   Collection Time: 08/01/14  6:20 AM  Result Value Ref Range Status   MRSA by PCR NEGATIVE NEGATIVE Final    Comment:        The GeneXpert MRSA Assay (FDA approved for NASAL specimens only), is one component of a comprehensive MRSA colonization surveillance program. It is not intended to diagnose MRSA infection nor to guide or monitor treatment for MRSA infections.   Respiratory virus panel (routine influenza)     Status: None   Collection Time: 08/01/14  8:40 PM  Result Value Ref Range Status   Source - RVPAN NOSE  Final   Respiratory Syncytial Virus A NOT DETECTED  Final   Respiratory Syncytial Virus B NOT DETECTED  Final   Influenza A NOT DETECTED  Final   Influenza B NOT DETECTED  Final   Parainfluenza 1 NOT DETECTED  Final   Parainfluenza 2 NOT DETECTED  Final   Parainfluenza 3 NOT DETECTED  Final   Metapneumovirus NOT DETECTED  Final   Rhinovirus NOT DETECTED  Final   Adenovirus NOT DETECTED  Final   Influenza A H1 NOT DETECTED  Final   Influenza A H3 NOT DETECTED  Final    Comment: (NOTE)       Normal Reference Range for each Analyte: NOT DETECTED Testing performed using the Luminex xTAG Respiratory Viral Panel test kit. The analytical performance characteristics of this assay have been determined by [facility].  The modifications have not  been cleared or approved by the FDA. This assay has been validated pursuant to the CLIA regulations and is used for clinical purposes. Performed at [facility], sputum-assessment     Status: None   Collection Time: 08/02/14  7:30 PM  Result Value  Ref Range Status   Specimen Description SPUTUM  Final   Special Requests NONE  Final   Sputum evaluation   Final    THIS SPECIMEN IS ACCEPTABLE. RESPIRATORY CULTURE REPORT TO FOLLOW.   Report Status 08/02/2014 FINAL  Final  Culture, respiratory (NON-Expectorated)     Status: None   Collection Time: 08/02/14  7:36 PM  Result Value Ref Range Status   Specimen Description SPUTUM  Final   Special Requests NONE  Final   Gram Stain   Final    MODERATE WBC PRESENT, PREDOMINANTLY PMN FEW SQUAMOUS EPITHELIAL CELLS PRESENT RARE YEAST Performed at [facility]    Culture   Final    FEW CANDIDA ALBICANS Performed at [facility]    Report Status 08/05/2014 FINAL  Final     Labs: Results for orders placed or performed during the hospital encounter of 08/08/14 (from the past 48 hour(s))  CBC     Status: Abnormal   Collection Time: 08/08/14  5:52 AM  Result Value Ref Range   WBC 10.1 4.0 - 10.5 K/uL   RBC 4.06 (L) 4.22 - 5.81 MIL/uL   Hemoglobin 11.1 (L) 13.0 - 17.0 g/dL   HCT 33.8 (L) 39.0 - 52.0 %   MCV 83.3 78.0 - 100.0 fL   MCH 27.3 26.0 - 34.0 pg   MCHC 32.8 30.0 - 36.0 g/dL   RDW 13.8 11.5 - 15.5 %   Platelets 362 150 - 400 K/uL  Comprehensive metabolic panel     Status: Abnormal   Collection Time: 08/08/14  5:52 AM  Result Value Ref Range   Sodium 129 (L) 137 - 147 mEq/L   Potassium 4.0 3.7 - 5.3 mEq/L   Chloride 87 (L) 96 - 112 mEq/L   CO2 29 19 - 32 mEq/L   Glucose, Bld 106 (H) 70 - 99 mg/dL   BUN 16 6 - 23 mg/dL   Creatinine, Ser 1.01 0.50 - 1.35 mg/dL   Calcium 9.2 8.4 - 10.5 mg/dL   Total Protein 7.1 6.0 - 8.3 g/dL   Albumin 2.7 (L) 3.5 - 5.2 g/dL   AST 25 0 - 37 U/L   ALT 32 0 - 53  U/L   Alkaline Phosphatase 70 39 - 117 U/L   Total Bilirubin 0.3 0.3 - 1.2 mg/dL   GFR calc non Af Amer 65 (L) >90 mL/min   GFR calc Af Amer 75 (L) >90 mL/min    Comment: (NOTE) The eGFR has been calculated using the CKD EPI equation. This calculation has not been validated in all clinical situations. eGFR's persistently <90 mL/min signify possible Chronic Kidney Disease.    Anion gap 13 5 - 15  CK     Status: None   Collection Time: 08/08/14  5:52 AM  Result Value Ref Range   Total CK 78 7 - 232 U/L  Magnesium     Status: None   Collection Time: 08/08/14  5:52 AM  Result Value Ref Range   Magnesium 1.7 1.5 - 2.5 mg/dL  Comprehensive metabolic panel     Status: Abnormal   Collection Time: 08/09/14  5:20 AM  Result Value Ref Range   Sodium 131 (L) 137 - 147 mEq/L   Potassium 4.0 3.7 - 5.3 mEq/L   Chloride 90 (L) 96 - 112 mEq/L   CO2 29 19 - 32 mEq/L   Glucose, Bld 95 70 - 99 mg/dL   BUN 15 6 - 23 mg/dL   Creatinine, Ser  1.07 0.50 - 1.35 mg/dL   Calcium 9.4 8.4 - 10.5 mg/dL   Total Protein 6.7 6.0 - 8.3 g/dL   Albumin 2.7 (L) 3.5 - 5.2 g/dL   AST 25 0 - 37 U/L   ALT 29 0 - 53 U/L   Alkaline Phosphatase 69 39 - 117 U/L   Total Bilirubin 0.2 (L) 0.3 - 1.2 mg/dL   GFR calc non Af Amer 60 (L) >90 mL/min   GFR calc Af Amer 70 (L) >90 mL/min    Comment: (NOTE) The eGFR has been calculated using the CKD EPI equation. This calculation has not been validated in all clinical situations. eGFR's persistently <90 mL/min signify possible Chronic Kidney Disease.    Anion gap 12 5 - 15  CBC     Status: Abnormal   Collection Time: 08/09/14  5:20 AM  Result Value Ref Range   WBC 10.0 4.0 - 10.5 K/uL   RBC 4.16 (L) 4.22 - 5.81 MIL/uL   Hemoglobin 11.3 (L) 13.0 - 17.0 g/dL   HCT 34.9 (L) 39.0 - 52.0 %   MCV 83.9 78.0 - 100.0 fL   MCH 27.2 26.0 - 34.0 pg   MCHC 32.4 30.0 - 36.0 g/dL   RDW 14.0 11.5 - 15.5 %   Platelets 383 150 - 400 K/uL     HPI :Chief Complaint: Weakness HPI:   80 year old male with hypertension, esophageal stricture, dysphagia, diastolic dysfunction presented with cough, shortness of breath, not feeling better in the last few weeks prior to admission on 12/3 . Patient found to have sepsis secondary to community-acquired pneumonia treated with broad-spectrum antibiotics and subsequently discharged home on levofloxacin. Patient had physical therapy evaluation and was deemed appropriate for discharge with home health. The patient came back after discharge because of weakness and headache.. Headache is described as left-sided patient has had this on and off during the last 7 days that he was hospitalized. Headache with this one well to Tylenol. No fevers or chills, no neck stiffness, no weakness or numbness. Patient has had persistent cough and is still requiring oxygen. Wife reports she has noticed confusion over the last week intermittently. CT of the head found to be negative. In discussion with the wife, she feels that patient is much too weak for her to care for by herself at home. Home health is set up oxygen, and is planning to come back home, but she feels like patient needs round-the-clock care that she is unable to offer him. He is too weak to support his own weight and ambulate to the bathroom.Patient readmitted for further evaluation possible SNF placement.   HOSPITAL COURSE:   Sepsis secondary to community acquired pneumonia versus aspiration pneumonia Patient was admitted 12/3-12/10 , chest x-ray showed possible left-sided infiltrate. Treated with Zithromax and Zosyn. Vancomycin was discontinued on 12/5 given clinical improvement. Subsequently discharged home on levofloxacin. Blood cultures has remained negative, sputum culture negative, rapid flu, urine for strep antigen, respiratory panel and legionella antigens have been negative. Home O2 eval was done and recommended 2L upon ambulation. Home oxygen was set up No pulmonary  complaints  Dysphagia: Patient has a history of esophageal stricture, was planning to have repeat endoscopy earlier this year due to recurrent dysphagia. Esophagogram done this admission showed stricture of a diameter of 1.1 cm. Gastroenterology was consulted. Patient was seen by Dr. Henrene Pastor who felt that good portion of his problem is dysmotility given the lack of response to relatively recent large caliber esophageal dilation. He  did not feel that patient is an appropriate candidate for sedation and repeat therapeutic dilation. Patient was recommended to continue with current soft diet.  Atrial fibrillation with RVR: Paroxysmal Possibly was triggered by pneumonia, currently normal sinus rhythm, patient did require Cardizem drip on during last admission.Marland Kitchen He was placed on aspirin. Thyroid function tests normal.  Chronic diastolic dysfunction, grade 1 without exacerbation Once oral intake was adequate with good urine output, hydration was stopped, no signs of pulmonary edema  Essential hypertension BP is currently stable, hydrochlorothiazide was discontinued due to hyponatremia during last admission  Hyponatremia attributed to dehydration Placed the patient on fluid restriction 1200 cc Discontinued HCTZ, Follow BMP weekly  Acute kidney injury-resolved Creatinine stable at 1  Weakness Could be secondary to hyponatremia PT/OT eval, being DC to SNF today  No gross focal neurologic deficits Doubt further neurologic workup is needed  Headache CT of the head negative Most likely secondary to uncontrolled hypertension vs insomnia Patient started on verapamil 40 mg 3 times a day for migraine prophylaxis ESR pending   Discharge Exam:  Blood pressure 145/53, pulse 47, temperature 98.3 F (36.8 C), temperature source Oral, resp. rate 20, height 5' 5"  (1.651 m), weight 66.6 kg (146 lb 13.2 oz), SpO2 92 %.  Cardiovascular: RRR, S1 normal, S2 normal, no MRG, pulses symmetric and intact  bilaterally  Pulmonary/Chest: CTAB, no wheezes, rales, or rhonchi  Abdominal: Soft. Non-tender, non-distended, bowel sounds are normal, no masses, organomegaly, or guarding present.  GU: no CVA tenderness Musculoskeletal: No joint deformities, erythema, or stiffness, ROM full and no nontender Ext: no edema and no cyanosis, pulses palpable bilaterally (DP and PT)  Hematology: no cervical, inginal, or axillary adenopathy.  Neurological: A&O xOriented to self and that he is at [facility], is unsure of the month and year.        Discharge Instructions    Diet - low sodium heart healthy    Complete by:  As directed      Increase activity slowly    Complete by:  As directed            Follow-up Information    Follow up with [name], MD. Schedule an appointment as soon as possible for a visit in 1 week.   Specialty:  Family Medicine   Contact information:   [place] [address] [phone]       Signed: Reyne Dumas 08/09/2014, 11:16 AM

## 2014-08-09 NOTE — Progress Notes (Signed)
Utilization Review completed.  

## 2014-08-09 NOTE — Clinical Social Work Note (Signed)
   CSW called [facility] to arrange for discharge  CSW uploaded discharge summary into care finders for snf review  CSW prepared discharge packet  CSW called for transport  CSW spoke with pt's wife Dub Mikes and let her know that pt would be transported to [facility]  No further CSW needs  CSW signing off  .Dede Query, LCSW [facility] Clinical Social Worker - Weekend Coverage cell #: [phone]

## 2014-08-11 ENCOUNTER — Other Ambulatory Visit: Payer: Self-pay | Admitting: *Deleted

## 2014-08-11 ENCOUNTER — Non-Acute Institutional Stay (SKILLED_NURSING_FACILITY): Payer: Medicare Other | Admitting: Registered Nurse

## 2014-08-11 DIAGNOSIS — M6281 Muscle weakness (generalized): Secondary | ICD-10-CM

## 2014-08-11 DIAGNOSIS — I5033 Acute on chronic diastolic (congestive) heart failure: Secondary | ICD-10-CM

## 2014-08-11 DIAGNOSIS — I4891 Unspecified atrial fibrillation: Secondary | ICD-10-CM

## 2014-08-11 DIAGNOSIS — E785 Hyperlipidemia, unspecified: Secondary | ICD-10-CM

## 2014-08-11 DIAGNOSIS — E871 Hypo-osmolality and hyponatremia: Secondary | ICD-10-CM

## 2014-08-11 DIAGNOSIS — J189 Pneumonia, unspecified organism: Secondary | ICD-10-CM

## 2014-08-11 DIAGNOSIS — I10 Essential (primary) hypertension: Secondary | ICD-10-CM

## 2014-08-11 DIAGNOSIS — K222 Esophageal obstruction: Secondary | ICD-10-CM

## 2014-08-11 MED ORDER — OXYCODONE HCL 5 MG PO TABS: ORAL_TABLET | ORAL | Status: DC

## 2014-08-11 NOTE — Telephone Encounter (Signed)
[facility] 

## 2014-08-12 LAB — BASIC METABOLIC PANEL
BUN: 27 mg/dL — AB (ref 4–21)
Creatinine: 1.3 mg/dL (ref 0.6–1.3)
GLUCOSE: 93 mg/dL
POTASSIUM: 4.4 mmol/L (ref 3.4–5.3)
SODIUM: 132 mmol/L — AB (ref 137–147)

## 2014-08-12 NOTE — Progress Notes (Signed)
Patient ID: QUE MENEELY, male   DOB: Jan 28, 1925, 80 y.o.   MRN: [id number]   Place of Service: [facility]  No Known Allergies  Code Status: Full Code  Goals of Care: Longevity/STR  Chief Complaint  Patient presents with  . Hospitalization Follow-up    HPI 80 y.o. male with PMH of HTN, left lung nodule, HLD, esophageal stricture, TIA among others is being seen for a follow-up visit post hospital admission from 08/10/14 to 08/07/14 for community acquired pneumonia and  atrial fib with RVR. He was placed on zithromax and zosyn in the hospital and was discharged with levoquin 750mg  daily x 5 days. Converted to NSR after cardizem gtt. Seen in room today. Family at beside. No complaints verbalized by patient.   Review of Systems Constitutional: Negative for fever, chills, and fatigue. HENT: Negative for ear pain, congestion, and sore throat Eyes: Negative for eye pain, eye discharge, and visual disturbance  Cardiovascular: Negative for chest pain, palpitations, and leg swelling Respiratory: Negative for shortness of breath and wheezing. Positive for productive cough Gastrointestinal: Negative for nausea and vomiting. Negative for abdominal pain, diarrhea and constipation.  Genitourinary: Negative for  dysuria Musculoskeletal: Negative for back pain, joint pain, and joint swelling  Neurological: Negative for dizziness and headache Skin: Negative for rash and wound.   Psychiatric: Negative for depression  Past Medical History  Diagnosis Date  . Hypertension   . Hyperlipidemia   . Tobacco abuse   . Lung nodule     Left lung, seen 2012, no change in 2012, no follow up needed   . PNA (pneumonia) 2010    Bilateral Pneumonia, COPD 26mm LLL Nodule   . History of ETT 09/1991     POS   . History of MRI 04-22-06    L/S- right hnp  l5/si   . B12 deficiency   . TIA (transient ischemic attack)   . GERD (gastroesophageal reflux disease)   . Esophageal stricture   . Hemorrhoids    . Colon polyps     hyperplastic    Past Surgical History  Procedure Laterality Date  . Cystectomy  1995    Lumbar area     History  Substance Use Topics  . Smoking status: Former Smoker    Types: Cigarettes    Quit date: 08/29/1985  . Smokeless tobacco: Former Systems developer    Types: Chew  . Alcohol Use: No    Family History  Problem Relation Age of Onset  . Hypertension Mother   . Heart attack Father   . Colon cancer Neg Hx   . Esophageal cancer Neg Hx    Family History  Problem Relation Age of Onset  . Hypertension Mother   . Heart attack Father   . Colon cancer Neg Hx   . Esophageal cancer Neg Hx       Medication List       This list is accurate as of: 08/11/14 11:59 PM.  Always use your most recent med list.               albuterol 108 (90 BASE) MCG/ACT inhaler  Commonly known as:  PROVENTIL HFA;VENTOLIN HFA  Inhale 2 puffs into the lungs every 6 (six) hours as needed for wheezing or shortness of breath.     amLODipine 10 MG tablet  Commonly known as:  NORVASC  Take 1 tablet (10 mg total) by mouth daily.     aspirin EC 81 MG tablet  Take 81  mg by mouth daily.     benzonatate 100 MG capsule  Commonly known as:  TESSALON PERLES  Take 1 capsule (100 mg total) by mouth 3 (three) times daily as needed for cough.     feeding supplement (ENSURE COMPLETE) Liqd  Take 237 mLs by mouth 2 (two) times daily between meals.     fluorouracil 5 % cream  Commonly known as:  EFUDEX  Apply 1 application topically daily.     guaiFENesin 600 MG 12 hr tablet  Commonly known as:  MUCINEX  Take 1 tablet (600 mg total) by mouth 2 (two) times daily.     levofloxacin 750 MG tablet  Commonly known as:  LEVAQUIN  Take 750 mg by mouth daily.     levofloxacin 750 MG tablet  Commonly known as:  LEVAQUIN  Take 1 tablet (750 mg total) by mouth daily. X 5 more days     lisinopril 20 MG tablet  Commonly known as:  PRINIVIL,ZESTRIL  Take 1 tablet (20 mg total) by mouth daily.      oxyCODONE 5 MG immediate release tablet  Commonly known as:  Oxy IR/ROXICODONE  Take one tablet by mouth every 6 hours as needed for moderate pain     pantoprazole 40 MG tablet  Commonly known as:  PROTONIX  Take 1 tablet (40 mg total) by mouth daily.     PRESERVISION AREDS 2 Caps  Take as directed.     simvastatin 20 MG tablet  Commonly known as:  ZOCOR  Take 1 tab by mouth at night        Physical Exam  BP 118/66 mmHg  Pulse 72  Temp(Src) 98.2 F (36.8 C)  Resp 19  Ht 5\' 5"  (1.651 m)  Wt 144 lb (65.318 kg)  BMI 23.96 kg/m2  SpO2 93%  Constitutional: WDWN elderly male in no acute distress. Conversant and pleasant HEENT: Normocephalic and atraumatic. PERRL. EOM intact. No icterus. Hard of hearing bilaterally. No nasal discharge or sinus tenderness. Oral mucosa moist. Posterior pharynx clear of any exudate or lesions.  Neck: Supple and nontender. No lymphadenopathy, masses, or thyromegaly. No JVD or carotid bruits. Cardiac: Normal S1, S2. RRR without appreciable murmurs, rubs, or gallops. Distal pulses intact. No dependent edema.  Lungs: No respiratory distress. Breath sounds clear diminished with fine crackles at bases. Oxygen via [place] in place.  Abdomen: Audible bowel sounds in all quadrants. Soft, nontender, nondistended. No palpable mass.  Musculoskeletal: able to move all extremities. Generalized muscle weakness present. No joint erythema or tenderness. Skin: Warm and dry. No rash noted. No erythema.  Neurological: Alert and oriented to person, place, and time. No focal deficits.  Psychiatric: Judgment and insight adequate. Appropriate mood and affect.   Labs Reviewed  CBC Latest Ref Rng 08/09/2014 08/08/2014 08/06/2014  WBC 4.0 - 10.5 K/uL 10.0 10.1 10.2  Hemoglobin 13.0 - 17.0 g/dL 11.3(L) 11.1(L) 10.5(L)  Hematocrit 39.0 - 52.0 % 34.9(L) 33.8(L) 33.1(L)  Platelets 150 - 400 K/uL 383 362 278    CMP Latest Ref Rng 08/09/2014 08/08/2014 08/07/2014  Glucose 70  - 99 mg/dL 95 106(H) 92  BUN 6 - 23 mg/dL 15 16 16   Creatinine 0.50 - 1.35 mg/dL 1.07 1.01 1.24  Sodium 137 - 147 mEq/L 131(L) 129(L) 129(L)  Potassium 3.7 - 5.3 mEq/L 4.0 4.0 3.6(L)  Chloride 96 - 112 mEq/L 90(L) 87(L) 86(L)  CO2 19 - 32 mEq/L 29 29 32  Calcium 8.4 - 10.5 mg/dL 9.4 9.2 9.1  Total Protein 6.0 - 8.3 g/dL 6.7 7.1 -  Total Bilirubin 0.3 - 1.2 mg/dL 0.2(L) 0.3 -  Alkaline Phos 39 - 117 U/L 69 70 -  AST 0 - 37 U/L 25 25 -  ALT 0 - 53 U/L 29 32 -     Lab Results  Component Value Date   TSH 1.690 08/01/2014    Lipid Panel     Component Value Date/Time   CHOL 160 11/08/2013 0840   TRIG 74.0 11/08/2013 0840   HDL 49.30 11/08/2013 0840   CHOLHDL 3 11/08/2013 0840   VLDL 14.8 11/08/2013 0840   LDLCALC 96 11/08/2013 0840    Diagnostic Studies Reviewed 08/01/14: CXR-chronic lung disease with 2 small new nodular opacities about the left hilium, could reflect developing infection.   Assessment & Plan 1. CAP (community acquired pneumonia) Continue levoquin 750mg  daily until 08/12/14. Continue mucinex 600mg  twice daily for congestion, tessalon perles 100mg  three times daily as needed for cough, and albuterol hfa 2 puffs every six hours as needed for shortness of breath and wheezing. Continue oxygen therapy 2L via [place] upon ambulation. Will repeat CXR in 2 weeks. Continue to monitor for now.   2. Acute on chronic diastolic heart failure Stable. No signs and symptoms of fluid overload and respiratory distress. HCTZ was discontinue in hospital for hyponatremia. Monitor weight daily and continue to monitor his status.   3. Atrial fibrillation with RVR (paroxysmal) Most likely secondary to pneumonia. NSR. Not a candidate for oral anticoagulation due to age. Continue asa 81mg  daily and monitor  4. Essential hypertension HCTZ was discontinued in hospital. Monitor BP Qshift x 5 days. Nursing staff to notify if BP consistently >140/90. Continue to monitor  5. Esophageal  stricture Soft diet was recommended by GI. Continue mechanical soft diet and aspiration precautions.   6. Muscle weakness (generalized) Continue to work with PT/OT for gait/balance/strength training. Fall risk precautions.   7. Hyponatremia HCTZ was d/c. Recheck BMP next lab draw. Continue to monitor for now.   8. HLD LDL 96. Continue zocor 20mg  daily and monitor   Diagnostic Studies/Labs Ordered: bmp next lab draw, cxr in 2 weeks.   Time spent: 40 minutes on care coordination  Family/Staff Communication Plan of care discussed with patient, family, and nursing staff. Patient, family, and nursing staff verbalized understanding and agree with plan of care. No additional questions or concerns reported.    Arthur Holms, MSN, AGNP-C [facility] [address] [place], [place] [address] [phone] [8am-5pm] After hours: [phone]

## 2014-08-14 ENCOUNTER — Non-Acute Institutional Stay (SKILLED_NURSING_FACILITY): Payer: Medicare Other | Admitting: Internal Medicine

## 2014-08-14 DIAGNOSIS — I10 Essential (primary) hypertension: Secondary | ICD-10-CM

## 2014-08-14 DIAGNOSIS — I4891 Unspecified atrial fibrillation: Secondary | ICD-10-CM

## 2014-08-14 DIAGNOSIS — I5189 Other ill-defined heart diseases: Secondary | ICD-10-CM

## 2014-08-14 DIAGNOSIS — J189 Pneumonia, unspecified organism: Secondary | ICD-10-CM

## 2014-08-14 DIAGNOSIS — K222 Esophageal obstruction: Secondary | ICD-10-CM

## 2014-08-14 DIAGNOSIS — E871 Hypo-osmolality and hyponatremia: Secondary | ICD-10-CM

## 2014-08-14 DIAGNOSIS — R5381 Other malaise: Secondary | ICD-10-CM

## 2014-08-14 DIAGNOSIS — I519 Heart disease, unspecified: Secondary | ICD-10-CM

## 2014-08-14 NOTE — Progress Notes (Signed)
Patient ID: [name], male   DOB: March 20, 1925, 80 y.o.   MRN: [id number]     Facility: [facility]    PCP: [name], MD  Code Status: full code  No Known Allergies  Chief Complaint  Patient presents with  . New Admit To SNF     HPI:  80 y/o male pt is here for STR after hospital admission from 08/10/14 to 08/07/14 for community acquired pneumonia and Afib with RVR. He was placed on zithromax and zosyn in the hospital and was discharged with levoquin 750mg  daily x 5 days. He has completed his antibiotics. He has cough with white phlegm and sounds congested as per wife. He is on o2 by nasal canula. He denies any concerns this visit. No new concern from staff He has PMH of HTN, left lung nodule, HLD, esophageal stricture, TIA   Review of Systems:  Constitutional: Negative for fever, chills,diaphoresis. gets tired easily. Has been working with therapy team. HENT: Negative for sore throat.   Eyes: Negative for eye pain, blurred vision, double vision and discharge.  Cardiovascular: Negative for chest pain, palpitations, leg swelling.  Gastrointestinal: Negative for heartburn, nausea, vomiting, abdominal pain. Had a bowel movement this am Genitourinary: Negative for dysuria Musculoskeletal: Negative for back pain, falls Skin: Negative for itching, rash.  Neurological: Negative for weakness,dizziness and headaches.  Psychiatric/Behavioral: Negative for depression and insomnia  Past Medical History  Diagnosis Date  . Hypertension   . Hyperlipidemia   . Tobacco abuse   . Lung nodule     Left lung, seen 2012, no change in 2012, no follow up needed   . PNA (pneumonia) 2010    Bilateral Pneumonia, COPD 5mm LLL Nodule   . History of ETT 09/1991     POS   . History of MRI 04-22-06    L/S- right hnp  l5/si   . B12 deficiency   . TIA (transient ischemic attack)   . GERD (gastroesophageal reflux disease)   . Esophageal stricture   . Hemorrhoids     . Colon polyps     hyperplastic   Past Surgical History  Procedure Laterality Date  . Cystectomy  1995    Lumbar area    Social History:   reports that he quit smoking about 28 years ago. His smoking use included Cigarettes. He smoked 0.00 packs per day. He has quit using smokeless tobacco. His smokeless tobacco use included Chew. He reports that he does not drink alcohol or use illicit drugs.  Family History  Problem Relation Age of Onset  . Hypertension Mother   . Heart attack Father   . Colon cancer Neg Hx   . Esophageal cancer Neg Hx     Medications: Patient's Medications  New Prescriptions   No medications on file  Previous Medications   ALBUTEROL (PROVENTIL HFA;VENTOLIN HFA) 108 (90 BASE) MCG/ACT INHALER    Inhale 2 puffs into the lungs every 6 (six) hours as needed for wheezing or shortness of breath.   AMLODIPINE (NORVASC) 10 MG TABLET    Take 1 tablet (10 mg total) by mouth daily.   ASPIRIN EC 81 MG TABLET    Take 81 mg by mouth daily.   BENZONATATE (TESSALON PERLES) 100 MG CAPSULE    Take 1 capsule (100 mg total) by mouth 3 (three) times daily as needed for cough.   FEEDING SUPPLEMENT, ENSURE COMPLETE, (ENSURE COMPLETE) LIQD    Take 237 mLs by mouth 2 (two) times  daily between meals.   FLUOROURACIL (EFUDEX) 5 % CREAM    Apply 1 application topically daily.    GUAIFENESIN (MUCINEX) 600 MG 12 HR TABLET    Take 1 tablet (600 mg total) by mouth 2 (two) times daily.   LISINOPRIL (PRINIVIL,ZESTRIL) 20 MG TABLET    Take 1 tablet (20 mg total) by mouth daily.   MULTIPLE VITAMINS-MINERALS (PRESERVISION AREDS 2) CAPS    Take as directed.   OXYCODONE (OXY IR/ROXICODONE) 5 MG IMMEDIATE RELEASE TABLET    Take one tablet by mouth every 6 hours as needed for moderate pain   PANTOPRAZOLE (PROTONIX) 40 MG TABLET    Take 1 tablet (40 mg total) by mouth daily.   SIMVASTATIN (ZOCOR) 20 MG TABLET    Take 1 tab by mouth at night  Modified Medications   No medications on file  Discontinued  Medications   No medications on file     Physical Exam: Filed Vitals:   08/14/14 1443  BP: 140/77  Pulse: 75  Temp: 98.8 F (37.1 C)  Resp: 18  SpO2: 95%    General- elderly male in no acute distress Head- atraumatic, normocephalic Eyes- PERRLA, EOMI, no pallor, no icterus, no discharge Neck- no cervical lymphadenopathy Throat- moist mucus membrane Cardiovascular- normal s1,s2, no murmurs, palpable dorsalis pedis Respiratory- bilateral decreased air entry, on o2, wheezing present, no rhonchi, no crackles, no use of accessory muscles Abdomen- bowel sounds present, soft, non tender Musculoskeletal- able to move all 4 extremities, no leg edema Neurological- no focal deficit Skin- warm and dry, skin tear to left leg Psychiatry- alert and oriented to person, place and time, normal mood and affect   Labs reviewed: Basic Metabolic Panel:  Recent Labs  08/07/14 0457 08/08/14 0552 08/09/14 0520  NA 129* 129* 131*  K 3.6* 4.0 4.0  CL 86* 87* 90*  CO2 32 29 29  GLUCOSE 92 106* 95  BUN 16 16 15   CREATININE 1.24 1.01 1.07  CALCIUM 9.1 9.2 9.4  MG  --  1.7  --    Liver Function Tests:  Recent Labs  08/01/14 0700 08/08/14 0552 08/09/14 0520  AST 22 25 25   ALT 16 32 29  ALKPHOS 76 70 69  BILITOT 0.5 0.3 0.2*  PROT 6.3 7.1 6.7  ALBUMIN 2.8* 2.7* 2.7*   No results for input(s): LIPASE, AMYLASE in the last 8760 hours. No results for input(s): AMMONIA in the last 8760 hours. CBC:  Recent Labs  01/09/14 1250 07/31/14 2154 08/01/14 0700  08/06/14 0444 08/08/14 0552 08/09/14 0520  WBC 8.1 21.9* 20.5*  < > 10.2 10.1 10.0  NEUTROABS 4.3 18.9* 18.0*  --   --   --   --   HGB 12.5* 13.5 11.5*  < > 10.5* 11.1* 11.3*  HCT 38.0* 42.1 35.9*  < > 33.1* 33.8* 34.9*  MCV 82.7 85.1 84.7  < > 83.8 83.3 83.9  PLT 320.0 333 PLATELET CLUMPS NOTED ON SMEAR, COUNT APPEARS ADEQUATE  < > 278 362 383  < > = values in this interval not displayed. Cardiac Enzymes:  Recent Labs   12/12/13 1344 07/31/14 2154 08/08/14 0552  CKTOTAL  --   --  38  TROPONINI <0.30 <0.30  --    Radiological Exams: 08/01/14: CXR-chronic lung disease with 2 small new nodular opacities about the left hilium, could reflect developing infection.    Assessment/Plan  Physical deconditioning In setting of recent pneumonia and afib episodes. Will have him work with physical therapy  and occupational therapy team to help with gait training and muscle strengthening exercises.fall precautions. Skin care. Encourage to be out of bed.   CAP  Completed his levaquin course. Will have him on duoneb q6h for 5 days, then prn, d/c albuterol for now. Continue mucinex. Will have him use incentive spirometer. If no improvement over the weekend, will get repeat chest xray. Continue oxygen therapy 2L via [place] and wean it off as tolerated  afib Rate currently controlled. Continue aspirin  CHF No signs of fluid overload on exam today. Monitor daily weight. Continue lisinopril  Essential hypertension bp stable. Was on HCTZ before but given his low sodium, continue amlodipine 10 mg daily and lisinopril 20 mg daily  Esophageal stricture Continue mechanical soft diet and aspiration precautions. Continue protonix 40 mg daily  Hyponatremia Off hctz for now, pending bmp  Family/ staff Communication: reviewed care plan with patient and nursing supervisor   Goals of care: short term rehabilitation   Blanchie Serve, MD  [place] [phone] (Monday-Friday 8 am - 5 pm) [phone] (afterhours)

## 2014-08-18 ENCOUNTER — Encounter: Payer: Self-pay | Admitting: Registered Nurse

## 2014-08-18 ENCOUNTER — Non-Acute Institutional Stay (SKILLED_NURSING_FACILITY): Payer: Medicare Other | Admitting: Registered Nurse

## 2014-08-18 DIAGNOSIS — R600 Localized edema: Secondary | ICD-10-CM

## 2014-08-18 DIAGNOSIS — I5189 Other ill-defined heart diseases: Secondary | ICD-10-CM

## 2014-08-18 DIAGNOSIS — I4891 Unspecified atrial fibrillation: Secondary | ICD-10-CM

## 2014-08-18 DIAGNOSIS — K222 Esophageal obstruction: Secondary | ICD-10-CM

## 2014-08-18 DIAGNOSIS — E871 Hypo-osmolality and hyponatremia: Secondary | ICD-10-CM

## 2014-08-18 DIAGNOSIS — I10 Essential (primary) hypertension: Secondary | ICD-10-CM

## 2014-08-18 DIAGNOSIS — I519 Heart disease, unspecified: Secondary | ICD-10-CM

## 2014-08-18 DIAGNOSIS — E785 Hyperlipidemia, unspecified: Secondary | ICD-10-CM

## 2014-08-18 DIAGNOSIS — K219 Gastro-esophageal reflux disease without esophagitis: Secondary | ICD-10-CM

## 2014-08-18 DIAGNOSIS — J189 Pneumonia, unspecified organism: Secondary | ICD-10-CM

## 2014-08-18 DIAGNOSIS — R5381 Other malaise: Secondary | ICD-10-CM

## 2014-08-18 NOTE — Progress Notes (Signed)
Patient ID: NYMIR RINGLER, male   DOB: [birth year], 80 y.o.   MRN: [id number]   Place of Service: [facility]  No Known Allergies  Code Status: Full Code  Goals of Care: Longevity/STR  Chief Complaint  Patient presents with  . Discharge Note    HPI 80 y.o. male with PMH of HTN, left lung nodule, HLD, esophageal stricture, TIA among others is being seen for a discharge visit. Patient was here for short term rehabilitation post hospital admission from 08/10/14 to 08/07/14 for community acquired pneumonia, atrial fib with RVR, and physical deconditioning. Patient has worked with therapy team and is ready to be discharged home with [facility] PT/OT. Seen in room today. No complaints verbalized by patient. Spouse is at bedside.   Review of Systems Constitutional: Negative for fever, chills, and fatigue. HENT: Negative for ear pain, congestion, and sore throat Eyes: Negative for eye pain, eye discharge, and visual disturbance  Cardiovascular: Negative for chest pain, palpitations. Positive for leg swelling Respiratory: Negative for shortness of breath and wheezing. Positive for productive cough Gastrointestinal: Negative for nausea and vomiting. Negative for abdominal pain, diarrhea and constipation.  Genitourinary: Negative for  Dysuria and hematuria Musculoskeletal: Negative for back pain, joint pain, and joint swelling  Neurological: Negative for dizziness and headache Skin: Negative for rash and wound.   Psychiatric: Negative for depression  Past Medical History  Diagnosis Date  . Hypertension   . Hyperlipidemia   . Tobacco abuse   . Lung nodule     Left lung, seen 2012, no change in 2012, no follow up needed   . PNA (pneumonia) 2010    Bilateral Pneumonia, COPD 3mm LLL Nodule   . History of ETT 09/1991     POS   . History of MRI 04-22-06    L/S- right hnp  l5/si   . B12 deficiency   . TIA (transient ischemic attack)   . GERD (gastroesophageal reflux disease)   .  Esophageal stricture   . Hemorrhoids   . Colon polyps     hyperplastic    Past Surgical History  Procedure Laterality Date  . Cystectomy  1995    Lumbar area     History  Substance Use Topics  . Smoking status: Former Smoker    Types: Cigarettes    Quit date: 08/29/1985  . Smokeless tobacco: Former Systems developer    Types: Chew  . Alcohol Use: No    Family History  Problem Relation Age of Onset  . Hypertension Mother   . Heart attack Father   . Colon cancer Neg Hx   . Esophageal cancer Neg Hx    Family History  Problem Relation Age of Onset  . Hypertension Mother   . Heart attack Father   . Colon cancer Neg Hx   . Esophageal cancer Neg Hx       Medication List       This list is accurate as of: 08/18/14 11:59 PM.  Always use your most recent med list.               albuterol 108 (90 BASE) MCG/ACT inhaler  Commonly known as:  PROVENTIL HFA;VENTOLIN HFA  Inhale 2 puffs into the lungs every 6 (six) hours as needed for wheezing or shortness of breath.     amLODipine 10 MG tablet  Commonly known as:  NORVASC  Take 1 tablet (10 mg total) by mouth daily.     aspirin EC 81 MG tablet  Take 81 mg by mouth daily.     benzonatate 100 MG capsule  Commonly known as:  TESSALON PERLES  Take 1 capsule (100 mg total) by mouth 3 (three) times daily as needed for cough.     feeding supplement (ENSURE COMPLETE) Liqd  Take 237 mLs by mouth 2 (two) times daily between meals.     fluorouracil 5 % cream  Commonly known as:  EFUDEX  Apply 1 application topically daily.     guaiFENesin 600 MG 12 hr tablet  Commonly known as:  MUCINEX  Take 1 tablet (600 mg total) by mouth 2 (two) times daily.     lisinopril 20 MG tablet  Commonly known as:  PRINIVIL,ZESTRIL  Take 1 tablet (20 mg total) by mouth daily.     oxyCODONE 5 MG immediate release tablet  Commonly known as:  Oxy IR/ROXICODONE  Take one tablet by mouth every 6 hours as needed for moderate pain     pantoprazole 40 MG  tablet  Commonly known as:  PROTONIX  Take 1 tablet (40 mg total) by mouth daily.     PRESERVISION AREDS 2 Caps  Take as directed.     simvastatin 20 MG tablet  Commonly known as:  ZOCOR  Take 1 tab by mouth at night        Physical Exam  BP 111/68 mmHg  Pulse 73  Temp(Src) 98.5 F (36.9 C)  Resp 18  Ht 5\' 5"  (1.651 m)  Wt 144 lb (65.318 kg)  BMI 23.96 kg/m2  SpO2 93%  Constitutional: WDWN elderly male in no acute distress. Conversant and pleasant HEENT: Normocephalic and atraumatic. PERRL. EOM intact. No icterus. Hard of hearing bilaterally. No nasal discharge or sinus tenderness. Oral mucosa moist. Posterior pharynx clear of any exudate or lesions.  Neck: Supple and nontender. No lymphadenopathy, masses, or thyromegaly. No JVD or carotid bruits. Cardiac: Normal S1, S2. RRR without appreciable murmurs, rubs, or gallops. Distal pulses intact. 1+ edema of BLE Lungs: No respiratory distress. Breath sounds diminished bilaterally. No rhonchi, wheezes, or crackles heard on exam. Abdomen: Audible bowel sounds in all quadrants. Soft, nontender, nondistended. No palpable mass.  Musculoskeletal: able to move all extremities. Generalized muscle weakness present. No joint erythema or tenderness. Skin: Warm and dry. No rash noted. No erythema.  Neurological: Alert and oriented to person, place, and time. No focal deficits.  Psychiatric: Judgment and insight adequate. Appropriate mood and affect.   Labs Reviewed  CBC Latest Ref Rng 08/09/2014 08/08/2014 08/06/2014  WBC 4.0 - 10.5 K/uL 10.0 10.1 10.2  Hemoglobin 13.0 - 17.0 g/dL 11.3(L) 11.1(L) 10.5(L)  Hematocrit 39.0 - 52.0 % 34.9(L) 33.8(L) 33.1(L)  Platelets 150 - 400 K/uL 383 362 278    CMP Latest Ref Rng 08/12/2014 08/09/2014 08/08/2014  Glucose 70 - 99 mg/dL - 95 106(H)  BUN 4 - 21 mg/dL 27(A) 15 16  Creatinine 0.6 - 1.3 mg/dL 1.3 1.07 1.01  Sodium 137 - 147 mmol/L 132(A) 131(L) 129(L)  Potassium 3.4 - 5.3 mmol/L 4.4 4.0  4.0  Chloride 96 - 112 mEq/L - 90(L) 87(L)  CO2 19 - 32 mEq/L - 29 29  Calcium 8.4 - 10.5 mg/dL - 9.4 9.2  Total Protein 6.0 - 8.3 g/dL - 6.7 7.1  Total Bilirubin 0.3 - 1.2 mg/dL - 0.2(L) 0.3  Alkaline Phos 39 - 117 U/L - 69 70  AST 0 - 37 U/L - 25 25  ALT 0 - 53 U/L - 29 32  Lab Results  Component Value Date   TSH 1.690 08/01/2014    Lipid Panel     Component Value Date/Time   CHOL 160 11/08/2013 0840   TRIG 74.0 11/08/2013 0840   HDL 49.30 11/08/2013 0840   CHOLHDL 3 11/08/2013 0840   VLDL 14.8 11/08/2013 0840   LDLCALC 96 11/08/2013 0840    Assessment & Plan 1. CAP (community acquired pneumonia) Completed abx. Continue mucinex 600mg  twice daily for congestion, tessalon perles 100mg  three times daily as needed for cough, and albuterol hfa 2 puffs every six hours as needed for shortness of breath and wheezing. Follow-up CXR at discretion of PCP.   2. CHF Stable. No signs and symptoms of fluid overload and respiratory distress. Continue to monitor weight daily.   3. Atrial fibrillation with RVR (paroxysmal) NSR on exam. Not a candidate for anticoagulation. Continue asa 81mg  daily.   4. Essential hypertension HCTZ was discontinued in hospital. BP stable. Continue norvasc 10mg  daily and lisinopril 20mg  daily  5. Esophageal stricture Continue mechanical soft diet and aspiration precautions.   6. Muscle weakness (generalized)/physical deconditioning  Continue to work with [facility] PT/OT for gait/balance/strength training to restore/maximize functional capacity.   7. Hyponatremia HCTZ was d/c. PCP to monitor   8. HLD LDL 96. Continue zocor 20mg  daily   9. GERD Stable. Continue protonix 40mg  daily   10. BLE swelling Most likely r/t multiple causes: discontinuation of hctz, side effect of norvasc, and venous insufficiency. Encourage elevation of BLE. Avoid prolonged standing and sitting. Recommended TED hose but patient reported intolerance due to itching.   Home  health services: PT/OT DME required:None PCP follow-up: Dr. [name] on 09/02/14 at 11:30am 30-day supply of prescription medications provided (#30 oxycodone 5mg )  Time spent: 35 minutes on care coordination and counseling   Family/Staff Communication Plan of care discussed with patient, family, and nursing staff. Patient, family, and nursing staff verbalized understanding and agree with plan of care. No additional questions or concerns reported.    Arthur Holms, MSN, AGNP-C [facility] [address] [place], [place] [address] [phone] [8am-5pm] After hours: [phone]

## 2014-08-23 ENCOUNTER — Emergency Department (HOSPITAL_COMMUNITY)
Admission: EM | Admit: 2014-08-23 | Discharge: 2014-08-23 | Disposition: A | Discharge status: Home / Self Care | Payer: Medicare Other | Source: Home / Self Care | Attending: Family Medicine | Admitting: Family Medicine

## 2014-08-23 ENCOUNTER — Encounter (HOSPITAL_COMMUNITY): Payer: Self-pay | Admitting: *Deleted

## 2014-08-23 ENCOUNTER — Telehealth (HOSPITAL_COMMUNITY): Payer: Self-pay | Admitting: Family Medicine

## 2014-08-23 DIAGNOSIS — R197 Diarrhea, unspecified: Secondary | ICD-10-CM

## 2014-08-23 HISTORY — DX: Pneumonia, unspecified organism: J18.9

## 2014-08-23 LAB — CLOSTRIDIUM DIFFICILE BY PCR: Toxigenic C. Difficile by PCR: POSITIVE — AB

## 2014-08-23 LAB — OCCULT BLOOD, POC DEVICE: FECAL OCCULT BLD: NEGATIVE

## 2014-08-23 MED ORDER — METRONIDAZOLE 500 MG PO TABS: 500.0000 mg | ORAL_TABLET | Freq: Three times a day (TID) | ORAL | Status: DC

## 2014-08-23 NOTE — ED Provider Notes (Signed)
[name] is a 80 y.o. male who presents to Urgent Care today for chest congestion and black stools. Patient was recently discharged from the hospital to a nursing home for short-term rehabilitation. He was discharged from the nursing home on December 21. He was in the hospital for community acquired pneumonia. Since discharge she's had continued congestion for which he uses his albuterol inhaler. He notes that he stopped using albuterol inhaler as much  since he's been home. Additionally he notes over the past 2 days black stool he denies any tarry stool abdominal pain fevers or chills. His wife is concerned that he may have C. difficile.   Past Medical History  Diagnosis Date  . Hypertension   . Hyperlipidemia   . Tobacco abuse   . Lung nodule     Left lung, seen 2012, no change in 2012, no follow up needed   . PNA (pneumonia) 2010    Bilateral Pneumonia, COPD 93mm LLL Nodule   . History of ETT 09/1991     POS   . History of MRI 04-22-06    L/S- right hnp  l5/si   . B12 deficiency   . TIA (transient ischemic attack)   . GERD (gastroesophageal reflux disease)   . Esophageal stricture   . Hemorrhoids   . Colon polyps     hyperplastic  . Pneumonia    Past Surgical History  Procedure Laterality Date  . Cystectomy  1995    Lumbar area    History  Substance Use Topics  . Smoking status: Former Smoker    Types: Cigarettes    Quit date: 08/29/1985  . Smokeless tobacco: Former Systems developer    Types: Chew  . Alcohol Use: No   ROS as above Medications: No current facility-administered medications for this encounter.   Current Outpatient Prescriptions  Medication Sig Dispense Refill  . amLODipine (NORVASC) 10 MG tablet Take 1 tablet (10 mg total) by mouth daily. 90 tablet 1  . aspirin EC 81 MG tablet Take 81 mg by mouth daily.    Marland Kitchen lisinopril (PRINIVIL,ZESTRIL) 20 MG tablet Take 1 tablet (20 mg total) by mouth daily. 90 tablet 1  . simvastatin (ZOCOR) 20 MG tablet Take 1 tab by  mouth at night (Patient taking differently: Take 20 mg by mouth daily at 6 PM. ) 15 tablet 0  . albuterol (PROVENTIL HFA;VENTOLIN HFA) 108 (90 BASE) MCG/ACT inhaler Inhale 2 puffs into the lungs every 6 (six) hours as needed for wheezing or shortness of breath. 1 Inhaler 2  . benzonatate (TESSALON PERLES) 100 MG capsule Take 1 capsule (100 mg total) by mouth 3 (three) times daily as needed for cough. 30 capsule 0  . feeding supplement, ENSURE COMPLETE, (ENSURE COMPLETE) LIQD Take 237 mLs by mouth 2 (two) times daily between meals. 30 Bottle 2  . fluorouracil (EFUDEX) 5 % cream Apply 1 application topically daily.     Marland Kitchen guaiFENesin (MUCINEX) 600 MG 12 hr tablet Take 1 tablet (600 mg total) by mouth 2 (two) times daily. 30 tablet 0  . Multiple Vitamins-Minerals (PRESERVISION AREDS 2) CAPS Take as directed. (Patient taking differently: Take 1 tablet by mouth daily. Take as directed.)    . oxyCODONE (OXY IR/ROXICODONE) 5 MG immediate release tablet Take one tablet by mouth every 6 hours as needed for moderate pain 120 tablet 0  . pantoprazole (PROTONIX) 40 MG tablet Take 1 tablet (40 mg total) by mouth daily. 30 tablet 3   No Known Allergies  Exam:  BP 154/71 mmHg  Pulse 78  Temp(Src) 98.3 F (36.8 C) (Oral)  Resp 18  SpO2 94% Gen: Well NAD HEENT: EOMI,  MMM Lungs: Normal work of breathing. Coarse breath sounds Heart: RRR no MRG Abd: NABS, Soft. Nondistended, Nontender Exts: Brisk capillary refill, warm and well perfused.   Results for orders placed or performed during the hospital encounter of 08/23/14 (from the past 24 hour(s))  Occult blood, poc device     Status: None   Collection Time: 08/23/14 12:22 PM  Result Value Ref Range   Fecal Occult Bld NEGATIVE NEGATIVE   No results found.  Assessment and Plan: 80 y.o. male with  1) congestion following pneumonia, located with COPD-like symptoms. Continue inhalers. No change in lung exam oxygen saturation today. 2) black stool. Not  melanotic. Very doubtful for C. difficile as patient does not have significant diarrhea or abdominal pain or fever. Stool culture and C. difficile pending. Will call patient with results. Recommend over-the-counter probiotics.  Discussed warning signs or symptoms. Please see discharge instructions. Patient expresses understanding.     Gregor Hams, MD 08/23/14 [phone]

## 2014-08-23 NOTE — Discharge Instructions (Signed)
Thank you for coming in today. Over-the-counter probiotics. Ask the pharmacist for help. Continue inhalers regularly for breathing and congestion.  We will call if lab tests come back positive Come back as needed If your belly pain worsens, or you have high fever, bad vomiting, blood in your stool or black tarry stool go to the Emergency Room.  Call or go to the emergency room if you get worse, have trouble breathing, have chest pains, or palpitations.    Clostridium Difficile Toxin This is a test which may be done when a patient has diarrhea that lasts for several days, or has abdominal pain, fever, and nausea after antibiotic therapy.  This test looks for the presence of Clostridium difficile (C.diff.) toxin in a stool sample. C.diff. is a germ (bacterium) that is one of the groups of bacteria that are usually in the colon, called "normal flora." If something upsets the growth of the other normal flora, C.diff. may overgrow and disrupt the balance of bacteria in the colon. C. diff. may produce two toxins, A and B. The combination of overgrowth and toxins may cause prolonged diarrhea. The toxins may damage the lining of the colon and lead to colitis.  While some cases of C. diff. diarrhea and colitis do not require treatment, others require specific oral antibiotic therapy. Most patients improve as the normal flora re-establishes itself, but about some may have one or more relapses, with symptoms and detectible toxin levels coming back. PREPARATION FOR TEST There is no special preparation for the test. A fresh stool sample is collected in a sterile container. The sample should not be mixed with urine or water. The stool should be taken to the lab within an hour. It may be refrigerated or frozen and taken to the lab as soon as possible. The container should be labeled with your name and the date and time of the stool collection.  NORMAL FINDINGS Negative Tissue Culture (no toxin identified) Ranges  for normal findings may vary among different laboratories and hospitals. You should always check with your doctor after having lab work or other tests done to discuss the meaning of your test results and whether your values are considered within normal limits. MEANING OF TEST  Your caregiver will go over the test results with you and discuss the importance and meaning of your results, as well as treatment options and the need for additional tests if necessary. OBTAINING THE TEST RESULTS It is your responsibility to obtain your test results. Ask the lab or department performing the test when and how you will get your results. Document Released: 09/07/2004 Document Revised: 11/07/2011 Document Reviewed: 07/24/2008 [facility] Patient Information 2015 [place], [place]. This information is not intended to replace advice given to you by your health care provider. Make sure you discuss any questions you have with your health care provider.

## 2014-08-23 NOTE — ED Notes (Signed)
Pt unable to verify med list.

## 2014-08-23 NOTE — ED Notes (Signed)
Patient C. difficile came back positive. Flagyl called into [facility] pharmacy.  Gregor Hams, MD 08/23/14 [phone]

## 2014-08-23 NOTE — ED Notes (Signed)
Pt reports starting with "black" diarrhea episodes and a few incontinent episodes (of stool) since getting discharged from nursing home 12/23.  Had been previously hospitalized for pneumonia.  Wife concerned for C-diff.  Denies any fevers, n/v, or abd pain.  C/O continued SOB (states not any worse than when diagnosed with pneumonia).  Continues with some coughing and chest congestion.

## 2014-08-23 NOTE — ED Notes (Signed)
Pt assisted to restroom for provision of stool sample.

## 2014-08-25 ENCOUNTER — Telehealth: Payer: Self-pay | Admitting: Family Medicine

## 2014-08-25 NOTE — Telephone Encounter (Signed)
Marlowe Kays, OT with [facility] left vm requesting VO for home health OT 2 x week for 4 weeks.  [phone].

## 2014-08-26 LAB — STOOL CULTURE: Special Requests: NORMAL

## 2014-08-26 NOTE — Telephone Encounter (Signed)
Please give the order.  Thanks.   

## 2014-08-26 NOTE — Telephone Encounter (Signed)
Left detailed message on voicemail of Marlowe Kays, [place] with [place].

## 2014-09-02 ENCOUNTER — Encounter: Payer: Self-pay | Admitting: *Deleted

## 2014-09-02 ENCOUNTER — Ambulatory Visit: Payer: Medicare Other | Admitting: Family Medicine

## 2014-09-02 ENCOUNTER — Encounter: Payer: Self-pay | Admitting: Family Medicine

## 2014-09-02 ENCOUNTER — Ambulatory Visit (INDEPENDENT_AMBULATORY_CARE_PROVIDER_SITE_OTHER)
Admission: RE | Admit: 2014-09-02 | Discharge: 2014-09-02 | Disposition: A | Discharge status: Home / Self Care | Payer: Self-pay | Source: Ambulatory Visit | Attending: Family Medicine | Admitting: Family Medicine

## 2014-09-02 ENCOUNTER — Ambulatory Visit (INDEPENDENT_AMBULATORY_CARE_PROVIDER_SITE_OTHER): Payer: Medicare Other | Admitting: Family Medicine

## 2014-09-02 VITALS — BP 142/60 | HR 85 | Temp 97.7°F | Wt 144.0 lb

## 2014-09-02 DIAGNOSIS — A047 Enterocolitis due to Clostridium difficile: Secondary | ICD-10-CM

## 2014-09-02 DIAGNOSIS — J189 Pneumonia, unspecified organism: Secondary | ICD-10-CM | POA: Diagnosis not present

## 2014-09-02 DIAGNOSIS — A0472 Enterocolitis due to Clostridium difficile, not specified as recurrent: Secondary | ICD-10-CM

## 2014-09-02 DIAGNOSIS — I10 Essential (primary) hypertension: Secondary | ICD-10-CM | POA: Diagnosis not present

## 2014-09-02 LAB — CBC WITH DIFFERENTIAL/PLATELET
Basophils Absolute: 0.1 10*3/uL (ref 0.0–0.1)
Basophils Relative: 0.4 % (ref 0.0–3.0)
Eosinophils Absolute: 0.1 10*3/uL (ref 0.0–0.7)
Eosinophils Relative: 0.8 % (ref 0.0–5.0)
HEMATOCRIT: 40.6 % (ref 39.0–52.0)
Hemoglobin: 12.8 g/dL — ABNORMAL LOW (ref 13.0–17.0)
LYMPHS PCT: 16.8 % (ref 12.0–46.0)
Lymphs Abs: 2.2 10*3/uL (ref 0.7–4.0)
MCHC: 31.6 g/dL (ref 30.0–36.0)
MCV: 84.3 fl (ref 78.0–100.0)
MONOS PCT: 6.1 % (ref 3.0–12.0)
Monocytes Absolute: 0.8 10*3/uL (ref 0.1–1.0)
Neutro Abs: 10.1 10*3/uL — ABNORMAL HIGH (ref 1.4–7.7)
Neutrophils Relative %: 75.9 % (ref 43.0–77.0)
PLATELETS: 383 10*3/uL (ref 150.0–400.0)
RBC: 4.81 Mil/uL (ref 4.22–5.81)
RDW: 14.9 % (ref 11.5–15.5)
WBC: 13.3 10*3/uL — ABNORMAL HIGH (ref 4.0–10.5)

## 2014-09-02 LAB — BASIC METABOLIC PANEL
BUN: 23 mg/dL (ref 6–23)
CHLORIDE: 98 meq/L (ref 96–112)
CO2: 30 mEq/L (ref 19–32)
Calcium: 8.8 mg/dL (ref 8.4–10.5)
Creatinine, Ser: 1.2 mg/dL (ref 0.4–1.5)
GFR: 62.58 mL/min (ref >60.00)
GLUCOSE: 143 mg/dL — AB (ref 70–99)
POTASSIUM: 3.8 meq/L (ref 3.5–5.1)
Sodium: 136 mEq/L (ref 135–145)

## 2014-09-02 MED ORDER — METRONIDAZOLE 500 MG PO TABS: 500.0000 mg | ORAL_TABLET | Freq: Three times a day (TID) | ORAL | Status: DC

## 2014-09-02 NOTE — Patient Instructions (Signed)
Go to the lab on the way out.  We'll contact you with your lab and xray report. Keep using your inhaler as needed in the meantime. Finish the metronidazole.  If you have any black stools after that, then restart the medicine and notify me by phone.  Take care.  Glad to see you.

## 2014-09-02 NOTE — Progress Notes (Signed)
Pre visit review using our clinic review tool, if applicable. No additional management support is needed unless otherwise documented below in the visit note.  To recap, CAP, admitted, treated, then to SNF for rehab.  Was discharged but had new onset black stools.  Rechecked, C Diff positive, started on flagyl with resolution of stool changes.  He hasn't used O2 since coming home and wanted to cancel it.  He was getting supplied through [facility].  His breathing is improved, but still needing his SABA.  Med list updated.   Prev with low NA and HCTZ stopped.  D/w pt.  Due for repeat labs and CXR.   He has had episodic L frontal HA since his illness started, quick onset and resolution w/o neuro sx.  CT head w/o acute changes to cause the HA noted.  All d/w pt and wife.   Meds, vitals, and allergies reviewed.   ROS: See HPI.  Otherwise, noncontributory.  nad elderly male in chair, walks with a walker ncat L frontal area not ttp, no rash, no bruising OP wnl Neck supple, no LA rrr Diffuse scattered rhonchi but no focal dec in BS, no inc in wob, speaking in complete sentences. abd soft, not ttp Ext w/o edema Not lightheaded on standing.

## 2014-09-03 DIAGNOSIS — A0472 Enterocolitis due to Clostridium difficile, not specified as recurrent: Secondary | ICD-10-CM | POA: Insufficient documentation

## 2014-09-03 NOTE — Assessment & Plan Note (Signed)
Controlled off HCTZ, see notes on labs re: NA.

## 2014-09-03 NOTE — Assessment & Plan Note (Signed)
Prev pos, no sx now.  Finish flagyl.  rx printed, if any return of sx then restart med and notify me.  He agrees.

## 2014-09-03 NOTE — Assessment & Plan Note (Signed)
Clinically improved, still with rhonchi, continue SABA for now and see notes on CXR and labs.  >25 minutes spent in face to face time with patient, >50% spent in counselling or coordination of care.

## 2014-09-12 ENCOUNTER — Encounter: Payer: Self-pay | Admitting: Family Medicine

## 2014-09-12 ENCOUNTER — Ambulatory Visit (INDEPENDENT_AMBULATORY_CARE_PROVIDER_SITE_OTHER): Payer: Medicare Other | Admitting: Family Medicine

## 2014-09-12 VITALS — BP 142/54 | HR 78 | Temp 98.5°F | Wt 144.0 lb

## 2014-09-12 DIAGNOSIS — A0472 Enterocolitis due to Clostridium difficile, not specified as recurrent: Secondary | ICD-10-CM

## 2014-09-12 DIAGNOSIS — A047 Enterocolitis due to Clostridium difficile: Secondary | ICD-10-CM

## 2014-09-12 DIAGNOSIS — J189 Pneumonia, unspecified organism: Secondary | ICD-10-CM

## 2014-09-12 NOTE — Patient Instructions (Addendum)
Finish the flagyl and notify me if you have more stool troubles when you are done.  Take care.  Glad to see you.  Your lungs sound better today.

## 2014-09-12 NOTE — Progress Notes (Signed)
Pre visit review using our clinic review tool, if applicable. No additional management support is needed unless otherwise documented below in the visit note.  Prev with Cdiff pos, started on flagyl and improved.  Once he stopped flagyl, then he had mucous in stool. Restarted flagyl and gas/stools improved.    Prev with CAP.  "Rattle" in chest yesterday, per RN at home.  Prev with CXR resolution.  He feels much better today.  No fevers, vomiting.  Some sputum, at baseline, white. Not SOB, "not unless I start running."  Clearly, not more SOB than prev and improved from yesterday.   Meds, vitals, and allergies reviewed.   ROS: See HPI.  Otherwise, noncontributory.  nad Elderly male in NAD ncat Mmm Neck supple, no LA rrr Scant faint rhonchi but improved from prev, CTAB o/w, no inc in wob.  Speaking in complete sentences.  abd benign, soft, not ttp, normal BS Ext w/o edema.

## 2014-09-14 NOTE — Assessment & Plan Note (Signed)
Finish the second round of flagyl.  If he has any suggestive sx after that, then he'll notify me.  We may have to change to PO vanc with long intermittent taper.  D/w pt briefly about possible med change.  He is improved now and is still okay for outpatient fu.

## 2014-09-14 NOTE — Assessment & Plan Note (Signed)
He is clearly improved from the last time I saw him and from the recent reports.  Wouldn't change meds at this point. CXR with prev clearing, d/w pt.   Lung exam improved today.

## 2014-09-15 ENCOUNTER — Telehealth: Payer: Self-pay | Admitting: Family Medicine

## 2014-09-15 ENCOUNTER — Ambulatory Visit: Payer: Medicare Other | Admitting: Family Medicine

## 2014-09-15 NOTE — Telephone Encounter (Signed)
If still on flagyl, I wouldn't likely change anything at this point.  I am glad to see him in the meantime.  It he feels well o/w, it may be reasonable to wait 1 day and see if this improves.  Thanks.

## 2014-09-15 NOTE — Telephone Encounter (Signed)
Pt's wife is calling to say she feels that the patient has c-diff again.  She said they were supposed to let you know as soon as they notice the first sign.  She said this started yesterday and his stools are black. Appt made for patient at 11:45am in case you need to see him today.  Please advise

## 2014-09-15 NOTE — Telephone Encounter (Signed)
Wife advised.  Appointment canceled for today and rescheduled for tomorrow.  Patient will call to cancel if doing much better in the morning.

## 2014-09-16 ENCOUNTER — Encounter: Payer: Self-pay | Admitting: Family Medicine

## 2014-09-16 ENCOUNTER — Ambulatory Visit (INDEPENDENT_AMBULATORY_CARE_PROVIDER_SITE_OTHER): Payer: Medicare Other | Admitting: Family Medicine

## 2014-09-16 VITALS — BP 136/60 | HR 99 | Temp 98.3°F | Wt 144.2 lb

## 2014-09-16 DIAGNOSIS — A047 Enterocolitis due to Clostridium difficile: Secondary | ICD-10-CM

## 2014-09-16 DIAGNOSIS — R0789 Other chest pain: Secondary | ICD-10-CM

## 2014-09-16 DIAGNOSIS — A0472 Enterocolitis due to Clostridium difficile, not specified as recurrent: Secondary | ICD-10-CM

## 2014-09-16 NOTE — Patient Instructions (Addendum)
Try taking 2 regular tylenol twice a day.  See if that helps with the pain.  Finish the flagyl. If you have more dark/abnormal stools or any mucous at the end of the antibiotics, then notify me.  If you have dark/abnormal stools or any mucous after finishing the antibiotics, then let me know about that too.  Take care.

## 2014-09-16 NOTE — Progress Notes (Signed)
Pre visit review using our clinic review tool, if applicable. No additional management support is needed unless otherwise documented below in the visit note.  H/o c diff. Restarted on flagyl just before last OV.  Black stool yesterday, normal today.  Prev with mucous in stools, not today.  Still with enough abx for about 6 more days of treatment.  This is the second round of flagyl. No FCNAV.  No abd pain.   Still with occ HA.  Still over the L eye.  Not taking tylenol prev.    His breathing is some better then prev, certainly not worse.  Still with occ sputum. Some L sided chest wall pain with a cough.    Meds, vitals, and allergies reviewed.   ROS: See HPI.  Otherwise, noncontributory.  nad Ncat, L frontal area not ttp and no rash Mmm Neck supple, no LA rrr ctab except for rare soft exp wheeze, likely at baseline w/o inc wob or focal dec in BS abd soft, not ttp, normal BS Ext w/o edema.

## 2014-09-17 NOTE — Assessment & Plan Note (Signed)
Improved lung exam, likely chest wall pain that is intermittent.  Can use tylenol prn. He agrees.  Still with no clear trigger for the L frontal HA/pain, but tylenol may be useful for that also.  D/w pt. He agrees.

## 2014-09-17 NOTE — Assessment & Plan Note (Signed)
Continue flagyl for now.  If more sx at the end of abx or after abx, then he'll notify me and we'll change to vanc, likely with a prolonged/pulsed taper.  D/w pt about c diff and tx. He agrees.  He is better today. abd benign.

## 2014-11-06 ENCOUNTER — Other Ambulatory Visit: Payer: Self-pay | Admitting: Internal Medicine

## 2014-11-11 ENCOUNTER — Other Ambulatory Visit: Payer: Self-pay | Admitting: *Deleted

## 2014-11-11 ENCOUNTER — Telehealth: Payer: Self-pay

## 2014-11-11 MED ORDER — BENZONATATE 100 MG PO CAPS: 100.0000 mg | ORAL_CAPSULE | Freq: Three times a day (TID) | ORAL | Status: DC | PRN

## 2014-11-11 NOTE — Telephone Encounter (Signed)
Received refill request from [facility] with note that states previously prescribed from different MD but patient request refill from Dr. Damita Dunnings.  Ok to refill?

## 2014-11-11 NOTE — Telephone Encounter (Signed)
Pt left note requesting refill simvastatin,lisinopri9l and amlodipine to [facility]; [facility] should have available refill; pt will ck with pharmacy.

## 2014-11-11 NOTE — Telephone Encounter (Signed)
Sent. Thanks.   

## 2014-11-12 ENCOUNTER — Telehealth: Payer: Self-pay

## 2014-11-12 NOTE — Telephone Encounter (Signed)
I thought he was still on all 4.  If so, then continue and send rxs.  If not, then adjust med list and let me know.  Thanks.

## 2014-11-12 NOTE — Telephone Encounter (Signed)
Left message on answering machine at home number to call back. 

## 2014-11-12 NOTE — Telephone Encounter (Signed)
Pt wants to verify that he is supposed to be taking amlodipine, lisinopril, simvastatin and pantoprazole. If pt is supposed to be taking amlodipine and pantoprazole pt request refills to [facility].(pt said does not have any refills available on those 2 meds). Pt request cb after verifying with Dr Damita Dunnings.

## 2014-11-13 ENCOUNTER — Other Ambulatory Visit: Payer: Self-pay | Admitting: *Deleted

## 2014-11-13 MED ORDER — SIMVASTATIN 20 MG PO TABS
ORAL_TABLET | ORAL | Status: DC
Start: 2014-11-13 — End: 2015-04-23

## 2014-11-13 MED ORDER — AMLODIPINE BESYLATE 10 MG PO TABS: 10.0000 mg | ORAL_TABLET | Freq: Every day | ORAL | Status: DC

## 2014-11-13 MED ORDER — LISINOPRIL 20 MG PO TABS: 20.0000 mg | ORAL_TABLET | Freq: Every day | ORAL | Status: DC

## 2014-11-13 NOTE — Telephone Encounter (Signed)
Spoke with patient.  Patient is currently out of his Amlodipine so a small quantity will be sent to [facility] to await mail order supply.  Amlodipine, Simvastatin, Lisinopril will be sent to mail order pharmacy.  Patient says he thinks that the Pantoprazole was discontinued at the last hospital admission.  Patient states he is doing fine without it and will stay off of it unless he starts to get symptoms.  Pantoprazole was removed from the meds list.

## 2014-11-14 NOTE — Telephone Encounter (Signed)
Noted, thanks!

## 2014-12-01 ENCOUNTER — Encounter: Payer: Self-pay | Admitting: Family Medicine

## 2014-12-01 ENCOUNTER — Ambulatory Visit (INDEPENDENT_AMBULATORY_CARE_PROVIDER_SITE_OTHER): Payer: Medicare Other | Admitting: Family Medicine

## 2014-12-01 VITALS — BP 130/66 | HR 95 | Temp 97.7°F | Ht 65.0 in | Wt 137.5 lb

## 2014-12-01 DIAGNOSIS — I10 Essential (primary) hypertension: Secondary | ICD-10-CM

## 2014-12-01 DIAGNOSIS — Z0000 Encounter for general adult medical examination without abnormal findings: Secondary | ICD-10-CM | POA: Diagnosis not present

## 2014-12-01 DIAGNOSIS — Z23 Encounter for immunization: Secondary | ICD-10-CM | POA: Diagnosis not present

## 2014-12-01 DIAGNOSIS — R413 Other amnesia: Secondary | ICD-10-CM | POA: Insufficient documentation

## 2014-12-01 NOTE — Patient Instructions (Signed)
Get a fasting lab appointment when convenient.   Recheck memory at a 53min appointment in about 3 months.  Take care.  Glad to see you.

## 2014-12-01 NOTE — Assessment & Plan Note (Signed)
Controlled, return for labs . 

## 2014-12-01 NOTE — Progress Notes (Signed)
Pre visit review using our clinic review tool, if applicable. No additional management support is needed unless otherwise documented below in the visit note.  CPE- See plan.  Routine anticipatory guidance given to patient.  See health maintenance. Tetanus 2013 Flu prev done PNA up to date Shingles 2012 Colon and prostate cancer screening not indicated.  He is working on diet.   Exercise is limited.   See memory testing.   Hypertension:    Using medication without problems or lightheadedness: yes Chest pain with exertion:no Edema:no Short of breath:at baseline, likely age appropriate.   Occ memory lapses.  Wife had noted some.  D/w pt.  No red flag events.  occ word searching, forgetting names.  Likely a gradual change.    PMH and SH reviewed  Meds, vitals, and allergies reviewed.   ROS: See HPI.  Otherwise negative.    GEN: nad, alert and oriented HEENT: mucous membranes moist NECK: supple w/o LA CV: rrr. PULM: ctab, no inc wob ABD: soft, +bs EXT: no edema SKIN: no acute rash  MMSE 27/30 (-1 orientation, -1 spelling DLROW, -1 figure drawing)

## 2014-12-01 NOTE — Assessment & Plan Note (Addendum)
Tetanus 2013 Flu prev done PNA up to date Shingles 2012 Colon and prostate cancer screening not indicated.  He is working on diet.   Exercise is limited.   See memory testing. MMSE 27/30 (-1 orientation, -1 spelling DLROW, -1 figure drawing)

## 2014-12-01 NOTE — Assessment & Plan Note (Signed)
MMSE 27/30 (-1 orientation, -1 spelling DLROW, -1 figure drawing) We'll check basic labs, pt and wife to monitor sx, and recheck in the summer of 2016.  He agrees.

## 2015-01-29 ENCOUNTER — Telehealth: Payer: Self-pay | Admitting: Internal Medicine

## 2015-01-29 NOTE — Telephone Encounter (Signed)
Pt states he is having problems swallowing again. States having quite a bit of phlegm also. Pt scheduled to see Lori Hvozdovic, PA-C 02/03/15@10 :15am. Pt aware of appt.

## 2015-02-03 ENCOUNTER — Ambulatory Visit: Payer: Medicare Other | Admitting: Physician Assistant

## 2015-02-26 ENCOUNTER — Ambulatory Visit: Payer: Medicare Other | Admitting: Physician Assistant

## 2015-02-26 ENCOUNTER — Telehealth: Payer: Self-pay | Admitting: Physician Assistant

## 2015-02-26 NOTE — Telephone Encounter (Signed)
No, he's 89 and sick.

## 2015-03-20 ENCOUNTER — Encounter: Payer: Self-pay | Admitting: Physician Assistant

## 2015-03-20 ENCOUNTER — Ambulatory Visit (INDEPENDENT_AMBULATORY_CARE_PROVIDER_SITE_OTHER): Payer: Medicare Other | Admitting: Physician Assistant

## 2015-03-20 VITALS — BP 122/62 | HR 68 | Ht 65.0 in | Wt 139.0 lb

## 2015-03-20 DIAGNOSIS — K219 Gastro-esophageal reflux disease without esophagitis: Secondary | ICD-10-CM | POA: Diagnosis not present

## 2015-03-20 DIAGNOSIS — K5900 Constipation, unspecified: Secondary | ICD-10-CM | POA: Diagnosis not present

## 2015-03-20 DIAGNOSIS — R1310 Dysphagia, unspecified: Secondary | ICD-10-CM

## 2015-03-20 MED ORDER — PANTOPRAZOLE SODIUM 40 MG PO TBEC: DELAYED_RELEASE_TABLET | ORAL | Status: DC

## 2015-03-20 NOTE — Progress Notes (Signed)
Patient ID: [name], male   DOB: Nov 14, 1924, 81 y.o.   MRN: [id number]     History of Present Illness: [name] is a pleasant 81 year old gentleman who was known to Dr. Henrene Pastor. He presents today with a one-year history of progressive dysphagia. Her dysphagia is primarily to solids. Over the past several weeks he has had 2 episodes where he has had to leave a restaurant because of food becoming lodged in his esophagus and he has had to vomit. As no dysphagia to liquids. He denies heartburn but has been having some intermittent regurgitation. He had an upper endoscopy in February 2013 and was found to have esophagitis and a ringlike stricture in the distal esophagus measuring 14 mm. He was dilated to [place]. He was seen in April 2015 at which time he was scheduled for repeat endoscopy however he developed pneumonia and had several hospitalizations and was unable to keep his appointment. He has a history of hyperlipidemia hypertension, TIA, acute on chronic diastolic heart failure, atrial fibrillation with RVR, esophageal dysmotility, vitamin B-12 deficiency, C. difficile colitis, and hypercholesterolemia. He also has a history of macular degeneration. He also reports that over the past several months he is skipping a day between bowel movements and then has either a nugget-like bowel movement or rub mushy bowel movement. He has had no bright red blood per rectum or melena. His last colonoscopy was in March 2013. 3 small polyps were removed and found to be benign and no routine follow-up was recommended.   Past Medical History  Diagnosis Date  . Hypertension   . Hyperlipidemia   . Tobacco abuse   . Lung nodule     Left lung, seen 2012, no change in 2012, no follow up needed   . PNA (pneumonia) 2010    Bilateral Pneumonia, COPD 79mm LLL Nodule   . History of ETT 09/1991     POS   . History of MRI 04-22-06    L/S- right hnp  l5/si   . B12 deficiency   . TIA (transient ischemic  attack)   . GERD (gastroesophageal reflux disease)   . Esophageal stricture   . Hemorrhoids   . Colon polyps     hyperplastic  . Pneumonia   . Macular degeneration     Past Surgical History  Procedure Laterality Date  . Cystectomy  1995    Lumbar area    Family History  Problem Relation Age of Onset  . Hypertension Mother   . Heart attack Father   . Colon cancer Neg Hx   . Esophageal cancer Neg Hx    History  Substance Use Topics  . Smoking status: Former Smoker    Types: Cigarettes    Quit date: 08/29/1985  . Smokeless tobacco: Former Systems developer    Types: Chew  . Alcohol Use: No   Current Outpatient Prescriptions  Medication Sig Dispense Refill  . albuterol (PROVENTIL HFA;VENTOLIN HFA) 108 (90 BASE) MCG/ACT inhaler Inhale 2 puffs into the lungs every 6 (six) hours as needed for wheezing or shortness of breath. 1 Inhaler 2  . amLODipine (NORVASC) 10 MG tablet Take 1 tablet (10 mg total) by mouth daily. 10 tablet 0  . aspirin EC 81 MG tablet Take 81 mg by mouth daily.    Marland Kitchen lisinopril (PRINIVIL,ZESTRIL) 20 MG tablet Take 1 tablet (20 mg total) by mouth daily. 90 tablet 3  . Multiple Vitamins-Minerals (PRESERVISION AREDS 2) CAPS Take as directed. (Patient taking differently: Take  1 tablet by mouth daily. Take as directed.)    . simvastatin (ZOCOR) 20 MG tablet Take 1 tab by mouth at night 90 tablet 3  . pantoprazole (PROTONIX) 40 MG tablet Take 1 tablet every morning 30 minutes prior to breakfast. 30 tablet 6   No current facility-administered medications for this visit.   No Known Allergies    Review of Systems: Gen: Denies any fever, chills, sweats, anorexia, fatigue, weakness, malaise, weight loss, and sleep disorder CV: Denies chest pain, angina, palpitations, syncope, orthopnea, PND, peripheral edema, and claudication. Resp: Denies dyspnea at rest, dyspnea with exercise, cough, sputum, wheezing, coughing up blood, and pleurisy. GI: Denies vomiting blood, jaundice, and  fecal incontinence. Has dysphagia to solids. GU : Denies urinary burning, blood in urine, urinary frequency, urinary hesitancy, nocturnal urination, and urinary incontinence. MS: Denies joint pain, limitation of movement, and swelling, stiffness, low back pain, extremity pain. Denies muscle weakness, cramps, atrophy.  Derm: Denies rash, itching, dry skin, hives, moles, warts, or unhealing ulcers.  Psych: Denies depression, anxiety, memory loss, suicidal ideation, hallucinations, paranoia, and confusion. Heme: Denies bruising, bleeding, and enlarged lymph nodes. Neuro:  Denies any headaches, dizziness, paresthesia Endo:  Denies any problems with DM, thyroid, adrenal    Physical Exam: General: Pleasant, elderly Caucasian male in no acute distress Head: Normocephalic and atraumatic Eyes:  sclerae anicteric, conjunctiva pink  Ears: Normal auditory acuity Lungs: Clear throughout to auscultation Heart: Regular rate and rhythm Abdomen: Soft, non distended, non-tender. No masses, no hepatomegaly. Normal bowel sounds Musculoskeletal: Symmetrical with no gross deformities  Extremities: No edema  Neurological: Alert oriented x 4, grossly nonfocal Psychological:  Alert and cooperative. Normal mood and affect  Assessment and Recommendations: #1 dysphagia. Patient has a known history of esophagitis and esophageal stricture. An antireflux regimen has been reviewed. He's been advised to cut his food up into very small pieces and chew thoroughly. He will be given a trial of pantoprazole 40 mg 1 by mouth every morning 30 minutes prior to breakfast. He will be scheduled for an EGD with possible dilation.The risks, benefits, and alternatives to endoscopy with possible biopsy and possible dilation were discussed with the patient and they consent to proceed. The procedure will be scheduled with Dr. Henrene Pastor.  #2. Constipation. Patient has been instructed to increase his water intake he will also try Benefiber 1  heaping tablespoon in 6-8 ounces of water before breakfast.  Further recommendations will be made pending the findings of the above.        Mahrosh Donnell, Deloris Ping 03/20/2015,

## 2015-03-20 NOTE — Patient Instructions (Signed)
You have been scheduled for an endoscopy. Please follow written instructions given to you at your visit today. If you use inhalers (even only as needed), please bring them with you on the day of your procedure.   We have sent medications to your pharmacy for you to pick up at your convenience.  Star Benefiber 1 tablespoon in [place] of water before breakfast.

## 2015-03-20 NOTE — Progress Notes (Signed)
Agree with initial assessment and plans 

## 2015-03-25 ENCOUNTER — Ambulatory Visit (AMBULATORY_SURGERY_CENTER): Payer: Medicare Other | Admitting: Internal Medicine

## 2015-03-25 ENCOUNTER — Encounter: Payer: Self-pay | Admitting: Internal Medicine

## 2015-03-25 VITALS — BP 133/73 | HR 64 | Temp 96.7°F | Resp 14 | Ht 65.0 in | Wt 139.0 lb

## 2015-03-25 DIAGNOSIS — R1310 Dysphagia, unspecified: Secondary | ICD-10-CM

## 2015-03-25 DIAGNOSIS — K219 Gastro-esophageal reflux disease without esophagitis: Secondary | ICD-10-CM

## 2015-03-25 DIAGNOSIS — K2100 Gastro-esophageal reflux disease with esophagitis, without bleeding: Secondary | ICD-10-CM

## 2015-03-25 DIAGNOSIS — K222 Esophageal obstruction: Secondary | ICD-10-CM

## 2015-03-25 DIAGNOSIS — K2980 Duodenitis without bleeding: Secondary | ICD-10-CM | POA: Diagnosis not present

## 2015-03-25 MED ORDER — PANTOPRAZOLE SODIUM 40 MG PO TBEC: DELAYED_RELEASE_TABLET | ORAL | Status: DC

## 2015-03-25 MED ORDER — SODIUM CHLORIDE 0.9 % IV SOLN: 500.0000 mL | INTRAVENOUS | Status: DC

## 2015-03-25 NOTE — Progress Notes (Signed)
Called to room to assist during endoscopic procedure.  Patient ID and intended procedure confirmed with present staff. Received instructions for my participation in the procedure from the performing physician.  

## 2015-03-25 NOTE — Op Note (Signed)
[facility]  [address] [place], [address]   ENDOSCOPY PROCEDURE REPORT  PATIENT: [name], [name]  MR#: [id number] BIRTHDATE: 1925/05/17 , 89  yrs. old GENDER: male ENDOSCOPIST: Eustace Quail, MD REFERRED BY:  .  Self / Office PROCEDURE DATE:  03/25/2015 PROCEDURE:  EGD w/ biopsy and Maloney dilation of esophagus   - 16f ASA CLASS:     Class II INDICATIONS:  dysphagia and history of esophageal reflux. MEDICATIONS: Monitored anesthesia care and Propofol 100 mg IV TOPICAL ANESTHETIC: none  DESCRIPTION OF PROCEDURE: After the risks benefits and alternatives of the procedure were thoroughly explained, informed consent was obtained.  The LB [id number] endoscope was introduced through the mouth and advanced to the second portion of the duodenum , Without limitations.  The instrument was slowly withdrawn as the mucosa was fully examined.  EXAM:Esophagus revealed a peptic stricture at the gastroesophageal junction (39 cm) measuring approximately 15 mm.  There was active esophagitis as manifested by friability and edema.  Stomach was normal.  The duodenal bulb revealed several superficial erosions. Post bulbar duodenum was normal.  CLO biopsy taken.  Retroflexed views revealed a hiatal hernia.     The scope was then withdrawn from the patient and the procedure completed. THERAPY: 54 French Maloney dilator was passed into the esophagus without significant resistance or heme. Tolerated well  COMPLICATIONS: There were no immediate complications.  ENDOSCOPIC IMPRESSION: 1. GERD complicated by esophagitis and peptic stricture status post dilation?"45 French 2. Duodenitis.  CLO biopsy taken  RECOMMENDATIONS: 1.  Take pantoprazole 40 mg daily. Looks like it was prescribed recently. 2. Treat for Helicobacter pylori if CLO biopsy positive 3.  Clear liquids until 12 noon , then soft foods rest of day. Resume prior diet tomorrow. 4. GI follow-up as needed  for recurrent swallowing issues  REPEAT EXAM:  eSigned:  Eustace Quail, MD 03/25/2015 10:26 AM    CC:The Patient and [name], MD

## 2015-03-25 NOTE — Progress Notes (Signed)
Report to PACU, RN, vss, BBS= Clear.  

## 2015-03-25 NOTE — Patient Instructions (Signed)
YOU HAD AN ENDOSCOPIC PROCEDURE TODAY AT [facility]:   Refer to the procedure report that was given to you for any specific questions about what was found during the examination.  If the procedure report does not answer your questions, please call your gastroenterologist to clarify.  If you requested that your care partner not be given the details of your procedure findings, then the procedure report has been included in a sealed envelope for you to review at your convenience later.  YOU SHOULD EXPECT: Some feelings of bloating in the abdomen. Passage of more gas than usual.  Walking can help get rid of the air that was put into your GI tract during the procedure and reduce the bloating. If you had a lower endoscopy (such as a colonoscopy or flexible sigmoidoscopy) you may notice spotting of blood in your stool or on the toilet paper. If you underwent a bowel prep for your procedure, you may not have a normal bowel movement for a few days.  Please Note:  You might notice some irritation and congestion in your nose or some drainage.  This is from the oxygen used during your procedure.  There is no need for concern and it should clear up in a day or so.  SYMPTOMS TO REPORT IMMEDIATELY:   Following upper endoscopy (EGD)  Vomiting of blood or coffee ground material  New chest pain or pain under the shoulder blades  Painful or persistently difficult swallowing  New shortness of breath  Fever of 100F or higher  Black, tarry-looking stools  For urgent or emergent issues, a gastroenterologist can be reached at any hour by calling [phone].   DIET: See dilation diet- Drink plenty of fluids but you should avoid alcoholic beverages for 24 hours.  ACTIVITY:  You should plan to take it easy for the rest of today and you should NOT DRIVE or use heavy machinery until tomorrow (because of the sedation medicines used during the test).    FOLLOW UP: Our staff will call the number  listed on your records the next business day following your procedure to check on you and address any questions or concerns that you may have regarding the information given to you following your procedure. If we do not reach you, we will leave a message.  However, if you are feeling well and you are not experiencing any problems, there is no need to return our call.  We will assume that you have returned to your regular daily activities without incident.  If any biopsies were taken you will be contacted by phone or by letter within the next 1-3 weeks.  Please call us at [phone] if you have not heard about the biopsies in 3 weeks.    SIGNATURES/CONFIDENTIALITY: You and/or your care partner have signed paperwork which will be entered into your electronic medical record.  These signatures attest to the fact that that the information above on your After Visit Summary has been reviewed and is understood.  Full responsibility of the confidentiality of this discharge information lies with you and/or your care-partner.  GERD, dilation diet-handouts given  Start pantoprazole 40 mg daily.  Follow-up with GI as needed for swallowing issues.

## 2015-03-26 ENCOUNTER — Telehealth: Payer: Self-pay | Admitting: *Deleted

## 2015-03-26 LAB — HELICOBACTER PYLORI SCREEN-BIOPSY: UREASE: NEGATIVE

## 2015-03-26 NOTE — Telephone Encounter (Signed)
  Follow up Call-  Call back number 03/25/2015  Post procedure Call Back phone  # [phone]  Permission to leave phone message Yes     Patient questions:  Do you have a fever, pain , or abdominal swelling? No. Pain Score  0 *  Have you tolerated food without any problems? Yes.    Have you been able to return to your normal activities? Yes.    Do you have any questions about your discharge instructions: Diet   No. Medications  No. Follow up visit  No.  Do you have questions or concerns about your Care? No.  Actions: * If pain score is 4 or above: No action needed, pain <4.

## 2015-03-27 ENCOUNTER — Inpatient Hospital Stay (HOSPITAL_COMMUNITY)
Admission: EM | Admit: 2015-03-27 | Discharge: 2015-04-02 | DRG: 480 | Disposition: A | Discharge status: Skilled Nursing Facility | Payer: Medicare Other | Attending: Internal Medicine | Admitting: Internal Medicine

## 2015-03-27 ENCOUNTER — Encounter (HOSPITAL_COMMUNITY): Payer: Self-pay

## 2015-03-27 ENCOUNTER — Emergency Department (HOSPITAL_COMMUNITY): Payer: Medicare Other

## 2015-03-27 DIAGNOSIS — Z86010 Personal history of colonic polyps: Secondary | ICD-10-CM | POA: Diagnosis not present

## 2015-03-27 DIAGNOSIS — K222 Esophageal obstruction: Secondary | ICD-10-CM | POA: Diagnosis present

## 2015-03-27 DIAGNOSIS — R0902 Hypoxemia: Secondary | ICD-10-CM

## 2015-03-27 DIAGNOSIS — Z79899 Other long term (current) drug therapy: Secondary | ICD-10-CM | POA: Diagnosis not present

## 2015-03-27 DIAGNOSIS — F17220 Nicotine dependence, chewing tobacco, uncomplicated: Secondary | ICD-10-CM | POA: Diagnosis present

## 2015-03-27 DIAGNOSIS — E538 Deficiency of other specified B group vitamins: Secondary | ICD-10-CM | POA: Diagnosis present

## 2015-03-27 DIAGNOSIS — K2980 Duodenitis without bleeding: Secondary | ICD-10-CM | POA: Diagnosis present

## 2015-03-27 DIAGNOSIS — R270 Ataxia, unspecified: Secondary | ICD-10-CM | POA: Diagnosis present

## 2015-03-27 DIAGNOSIS — Z8673 Personal history of transient ischemic attack (TIA), and cerebral infarction without residual deficits: Secondary | ICD-10-CM | POA: Diagnosis not present

## 2015-03-27 DIAGNOSIS — Z8249 Family history of ischemic heart disease and other diseases of the circulatory system: Secondary | ICD-10-CM | POA: Diagnosis not present

## 2015-03-27 DIAGNOSIS — I5033 Acute on chronic diastolic (congestive) heart failure: Secondary | ICD-10-CM | POA: Diagnosis present

## 2015-03-27 DIAGNOSIS — S72019A Unspecified intracapsular fracture of unspecified femur, initial encounter for closed fracture: Secondary | ICD-10-CM

## 2015-03-27 DIAGNOSIS — K210 Gastro-esophageal reflux disease with esophagitis: Secondary | ICD-10-CM | POA: Diagnosis present

## 2015-03-27 DIAGNOSIS — I519 Heart disease, unspecified: Secondary | ICD-10-CM | POA: Diagnosis not present

## 2015-03-27 DIAGNOSIS — R296 Repeated falls: Secondary | ICD-10-CM | POA: Diagnosis present

## 2015-03-27 DIAGNOSIS — E785 Hyperlipidemia, unspecified: Secondary | ICD-10-CM | POA: Diagnosis present

## 2015-03-27 DIAGNOSIS — K224 Dyskinesia of esophagus: Secondary | ICD-10-CM | POA: Diagnosis not present

## 2015-03-27 DIAGNOSIS — H3530 Unspecified macular degeneration: Secondary | ICD-10-CM | POA: Diagnosis present

## 2015-03-27 DIAGNOSIS — Z66 Do not resuscitate: Secondary | ICD-10-CM | POA: Diagnosis present

## 2015-03-27 DIAGNOSIS — W19XXXA Unspecified fall, initial encounter: Secondary | ICD-10-CM | POA: Diagnosis present

## 2015-03-27 DIAGNOSIS — I10 Essential (primary) hypertension: Secondary | ICD-10-CM | POA: Diagnosis present

## 2015-03-27 DIAGNOSIS — J9601 Acute respiratory failure with hypoxia: Secondary | ICD-10-CM | POA: Diagnosis not present

## 2015-03-27 DIAGNOSIS — I272 Other secondary pulmonary hypertension: Secondary | ICD-10-CM | POA: Diagnosis present

## 2015-03-27 DIAGNOSIS — D62 Acute posthemorrhagic anemia: Secondary | ICD-10-CM | POA: Diagnosis not present

## 2015-03-27 DIAGNOSIS — S72012A Unspecified intracapsular fracture of left femur, initial encounter for closed fracture: Secondary | ICD-10-CM | POA: Diagnosis present

## 2015-03-27 DIAGNOSIS — I4891 Unspecified atrial fibrillation: Secondary | ICD-10-CM | POA: Diagnosis present

## 2015-03-27 DIAGNOSIS — I5189 Other ill-defined heart diseases: Secondary | ICD-10-CM | POA: Diagnosis present

## 2015-03-27 DIAGNOSIS — S72009S Fracture of unspecified part of neck of unspecified femur, sequela: Secondary | ICD-10-CM | POA: Diagnosis not present

## 2015-03-27 DIAGNOSIS — Z7982 Long term (current) use of aspirin: Secondary | ICD-10-CM | POA: Diagnosis not present

## 2015-03-27 DIAGNOSIS — Y9289 Other specified places as the place of occurrence of the external cause: Secondary | ICD-10-CM | POA: Diagnosis not present

## 2015-03-27 DIAGNOSIS — Z8701 Personal history of pneumonia (recurrent): Secondary | ICD-10-CM | POA: Diagnosis not present

## 2015-03-27 DIAGNOSIS — S72009A Fracture of unspecified part of neck of unspecified femur, initial encounter for closed fracture: Secondary | ICD-10-CM | POA: Diagnosis present

## 2015-03-27 DIAGNOSIS — R059 Cough, unspecified: Secondary | ICD-10-CM

## 2015-03-27 DIAGNOSIS — R05 Cough: Secondary | ICD-10-CM

## 2015-03-27 DIAGNOSIS — J449 Chronic obstructive pulmonary disease, unspecified: Secondary | ICD-10-CM | POA: Diagnosis present

## 2015-03-27 DIAGNOSIS — M25552 Pain in left hip: Secondary | ICD-10-CM | POA: Diagnosis present

## 2015-03-27 LAB — TYPE AND SCREEN
ABO/RH(D): O POS
Antibody Screen: NEGATIVE

## 2015-03-27 LAB — CBC WITH DIFFERENTIAL/PLATELET
BASOS PCT: 0 % (ref 0–1)
Basophils Absolute: 0 10*3/uL (ref 0.0–0.1)
EOS PCT: 1 % (ref 0–5)
Eosinophils Absolute: 0.1 10*3/uL (ref 0.0–0.7)
HCT: 42.4 % (ref 39.0–52.0)
Hemoglobin: 13.6 g/dL (ref 13.0–17.0)
Lymphocytes Relative: 18 % (ref 12–46)
Lymphs Abs: 1.9 10*3/uL (ref 0.7–4.0)
MCH: 27 pg (ref 26.0–34.0)
MCHC: 32.1 g/dL (ref 30.0–36.0)
MCV: 84.1 fL (ref 78.0–100.0)
Monocytes Absolute: 0.6 10*3/uL (ref 0.1–1.0)
Monocytes Relative: 6 % (ref 3–12)
Neutro Abs: 7.9 10*3/uL — ABNORMAL HIGH (ref 1.7–7.7)
Neutrophils Relative %: 75 % (ref 43–77)
Platelets: 249 10*3/uL (ref 150–400)
RBC: 5.04 MIL/uL (ref 4.22–5.81)
RDW: 14.6 % (ref 11.5–15.5)
WBC: 10.5 10*3/uL (ref 4.0–10.5)

## 2015-03-27 LAB — BASIC METABOLIC PANEL
Anion gap: 7 (ref 5–15)
BUN: 20 mg/dL (ref 6–20)
CO2: 29 mmol/L (ref 22–32)
CREATININE: 1.08 mg/dL (ref 0.61–1.24)
Calcium: 9.1 mg/dL (ref 8.9–10.3)
Chloride: 99 mmol/L — ABNORMAL LOW (ref 101–111)
GFR calc Af Amer: >60 mL/min (ref >60)
GFR calc non Af Amer: 59 mL/min — ABNORMAL LOW (ref >60)
Glucose, Bld: 121 mg/dL — ABNORMAL HIGH (ref 65–99)
Potassium: 4.5 mmol/L (ref 3.5–5.1)
Sodium: 135 mmol/L (ref 135–145)

## 2015-03-27 LAB — ABO/RH: ABO/RH(D): O POS

## 2015-03-27 MED ORDER — DOCUSATE SODIUM 100 MG PO CAPS
100.0000 mg | ORAL_CAPSULE | Freq: Two times a day (BID) | ORAL | Status: DC
  Administered 2015-03-28: 100 mg via ORAL
  Filled 2015-03-27 (×2): qty 1

## 2015-03-27 MED ORDER — FLEET ENEMA 7-19 GM/118ML RE ENEM: 1.0000 | ENEMA | Freq: Once | RECTAL | Status: AC | PRN

## 2015-03-27 MED ORDER — MORPHINE SULFATE 2 MG/ML IJ SOLN
1.0000 mg | INTRAMUSCULAR | Status: DC | PRN
  Administered 2015-03-28: 1 mg via INTRAVENOUS
  Filled 2015-03-27: qty 1

## 2015-03-27 MED ORDER — SIMVASTATIN 20 MG PO TABS
20.0000 mg | ORAL_TABLET | Freq: Every day | ORAL | Status: DC
  Administered 2015-03-28 – 2015-04-01 (×5): 20 mg via ORAL
  Filled 2015-03-27 (×6): qty 1

## 2015-03-27 MED ORDER — CHLORHEXIDINE GLUCONATE 4 % EX LIQD
Freq: Once | CUTANEOUS | Status: DC
  Filled 2015-03-27: qty 15

## 2015-03-27 MED ORDER — TOBRAMYCIN 0.3 % OP SOLN
1.0000 [drp] | Freq: Two times a day (BID) | OPHTHALMIC | Status: DC
  Administered 2015-03-28 – 2015-04-02 (×12): 1 [drp] via OPHTHALMIC
  Filled 2015-03-27: qty 5

## 2015-03-27 MED ORDER — SODIUM CHLORIDE 0.9 % IV SOLN: INTRAVENOUS | Status: DC

## 2015-03-27 MED ORDER — SODIUM CHLORIDE 0.9 % IV SOLN
INTRAVENOUS | Status: DC
  Administered 2015-03-28: 01:00:00 via INTRAVENOUS

## 2015-03-27 MED ORDER — METHOCARBAMOL 500 MG PO TABS
500.0000 mg | ORAL_TABLET | Freq: Four times a day (QID) | ORAL | Status: DC | PRN
  Administered 2015-03-29 – 2015-03-31 (×5): 500 mg via ORAL
  Filled 2015-03-27 (×5): qty 1

## 2015-03-27 MED ORDER — AMLODIPINE BESYLATE 10 MG PO TABS
10.0000 mg | ORAL_TABLET | Freq: Every day | ORAL | Status: DC
  Administered 2015-03-28 – 2015-04-02 (×6): 10 mg via ORAL
  Filled 2015-03-27 (×6): qty 1

## 2015-03-27 MED ORDER — GUAIFENESIN ER 600 MG PO TB12
600.0000 mg | ORAL_TABLET | Freq: Two times a day (BID) | ORAL | Status: DC
  Administered 2015-03-29 – 2015-04-02 (×9): 600 mg via ORAL
  Filled 2015-03-27 (×10): qty 1

## 2015-03-27 MED ORDER — CEFAZOLIN SODIUM-DEXTROSE 2-3 GM-% IV SOLR
2.0000 g | INTRAVENOUS | Status: AC
  Administered 2015-03-28: 2 g via INTRAVENOUS
  Filled 2015-03-27: qty 50

## 2015-03-27 MED ORDER — ALBUTEROL SULFATE (2.5 MG/3ML) 0.083% IN NEBU
2.5000 mg | INHALATION_SOLUTION | RESPIRATORY_TRACT | Status: DC | PRN
  Filled 2015-03-27: qty 3

## 2015-03-27 MED ORDER — CHLORHEXIDINE GLUCONATE 4 % EX LIQD
60.0000 mL | Freq: Once | CUTANEOUS | Status: DC
  Filled 2015-03-27: qty 60

## 2015-03-27 MED ORDER — BISACODYL 10 MG RE SUPP
10.0000 mg | Freq: Every day | RECTAL | Status: DC | PRN
  Administered 2015-04-01: 10 mg via RECTAL
  Filled 2015-03-27: qty 1

## 2015-03-27 MED ORDER — IPRATROPIUM BROMIDE 0.02 % IN SOLN: 0.5000 mg | Freq: Four times a day (QID) | RESPIRATORY_TRACT | Status: DC

## 2015-03-27 MED ORDER — PANTOPRAZOLE SODIUM 40 MG PO TBEC
40.0000 mg | DELAYED_RELEASE_TABLET | Freq: Every day | ORAL | Status: DC
  Administered 2015-03-28 – 2015-04-02 (×6): 40 mg via ORAL
  Filled 2015-03-27 (×6): qty 1

## 2015-03-27 MED ORDER — METHOCARBAMOL 1000 MG/10ML IJ SOLN
500.0000 mg | Freq: Four times a day (QID) | INTRAVENOUS | Status: DC | PRN
  Filled 2015-03-27: qty 5

## 2015-03-27 MED ORDER — FENTANYL CITRATE (PF) 100 MCG/2ML IJ SOLN
50.0000 ug | Freq: Once | INTRAMUSCULAR | Status: AC
  Administered 2015-03-27: 50 ug via INTRAVENOUS
  Filled 2015-03-27: qty 2

## 2015-03-27 MED ORDER — HYDROCODONE-ACETAMINOPHEN 5-325 MG PO TABS
1.0000 | ORAL_TABLET | Freq: Four times a day (QID) | ORAL | Status: DC | PRN
  Administered 2015-03-29: 2 via ORAL
  Administered 2015-03-29 – 2015-04-01 (×5): 1 via ORAL
  Filled 2015-03-27 (×2): qty 1
  Filled 2015-03-27: qty 2
  Filled 2015-03-27 (×3): qty 1
  Filled 2015-03-27: qty 2

## 2015-03-27 MED ORDER — POLYETHYLENE GLYCOL 3350 17 G PO PACK
17.0000 g | PACK | Freq: Every day | ORAL | Status: DC | PRN
  Administered 2015-03-31: 17 g via ORAL
  Filled 2015-03-27: qty 1

## 2015-03-27 NOTE — ED Notes (Signed)
MD at bedside. 

## 2015-03-27 NOTE — ED Notes (Signed)
Family at bedside. 

## 2015-03-27 NOTE — Clinical Social Work Note (Signed)
Clinical Social Work Assessment  Patient Details  Name: [name] MRN: [id number] Date of Birth: [birth year]  Date of referral:  03/27/15               Reason for consult:   (Fall.)                Permission sought to share information with:   (None.) Permission granted to share information::  No  Name::        Agency::     Relationship::     Contact Information:     Housing/Transportation Living arrangements for the past 2 months:  Single Family Home Source of Information:  Patient, Spouse Patient Interpreter Needed:  None Criminal Activity/Legal Involvement Pertinent to Current Situation/Hospitalization:  No - Comment as needed Significant Relationships:    Lives with:  Spouse Do you feel safe going back to the place where you live?  Yes Need for family participation in patient care:  Yes (Comment) (Wife is priamry support and lives with patient.)  Care giving concerns:  Patient informed CSW that he lives at home with his wife. He states that he feels safe to return home. However, the patient states if rehab is needed he would be interested in a facility. According to wife, the patient has been to [facility] in the past.    Facilities manager / plan:  CSW met with patient at bedside. Wife and sister in law were present. Patient confirms that he presents to [facility] due to fall. Patient states that he is not sure what caused him to fall. He states that he has fallen x3 or x4 within the past month. Patient states that prior to falling today he was able to complete his ADL's independently. Patient states that he feels safe to return home. However, he states that if rehab is needed he would prefer a facility.  Wife states that she and the patient live together in [place]. Wife states that patient has broken his hip tonight.   CSW provided patient and wife a SNF list.    Employment status:  Retired Forensic scientist:   Nurse, mental health) PT Recommendations:  Not assessed at  this time Information / Referral to community resources:   (CSW provided patient and family with SNF list.)  Patient/Family's Response to care:  Patient and wife are aware that he will be admitted. Their response to care is appropriate at this time. Wife informed CSW that the patient is in pain. CSW made nurse aware.  Patient/Family's Understanding of and Emotional Response to Diagnosis, Current Treatment, and Prognosis:  Patient and wife are understanding at this time. They have no questions for CSW.  Wife/ Joaquim Lai [phone] Sister in law [phone]  Emotional Assessment Appearance:  Appears older than stated age Attitude/Demeanor/Rapport:   (Well Mannered. Appropriate.) Affect (typically observed):  Accepting, Appropriate Orientation:  Oriented to Self, Oriented to Place, Oriented to  Time, Oriented to Situation Alcohol / Substance use:  Not Applicable Psych involvement (Current and /or in the community):  No (Comment)  Discharge Needs  Concerns to be addressed:  Adjustment to Illness Readmission within the last 30 days:  No Current discharge risk:  None Barriers to Discharge:  No Barriers Identified   Bernita Buffy, LCSW 03/27/2015, 8:50 PM

## 2015-03-27 NOTE — H&P (Signed)
PCP: [name], MD  Kathrine Cords  Referring provider [place]   Chief Complaint: Left hip pain after fall  HPI: [name] is a 81 y.o. male   has a past medical history of Hypertension; Hyperlipidemia; Tobacco abuse; Lung nodule; PNA (pneumonia) (2010); History of ETT (09/1991 ); History of MRI (04-22-06); B12 deficiency; TIA (transient ischemic attack); GERD (gastroesophageal reflux disease); Esophageal stricture; Hemorrhoids; Colon polyps; Pneumonia; and Macular degeneration.   Presented with  Patient was at the pharmacy today when he had suffered a fall on his left side while walking out of the car towards the pharmacy door. He was having trouble trying to get up due to pain. Denies any head injury. He was brought in to [facility] by EMS. Plain films found left hip subcapital fracture. ER provider has consulted Dr. Doran Durand with orthopedics. We'll arrange for OR time but at this point unsure when. Patient to be admitted by hospitalist.  Of note yesterday patient has undergone EGD to treat esophagitis and has undergone dilatation. There was evidence of duadenitis he was continued on protonix with the plan to initiate H.pylori treatment.   Patient has hx of TIA on Aspirin 81mg  only. Last echogram was in December 2015 showing preserved EF but show evidence of mild-to-moderate pulmonary hypertension and grade 1 diastolic dysfunction.  A shin had a history of atrial fibrillation with RVR in December 2015 in the setting of community-acquired pneumonia and acute respiratory failure since then patient return to normal sinus rhythm and was started on aspirin.   Patient denis any chest pain occasional shortness of breath due to hx of pulmonary disease wife states asthma. He has a bit of a chronic cough. He has occasional leg swelling.     Hospitalist was called for admission for left subcapital hip fracture  Review of Systems:    Pertinent positives include:   Constitutional:  No  weight loss, night sweats, Fevers, chills, fatigue, weight loss  HEENT:  No headaches, Difficulty swallowing,Tooth/dental problems,Sore throat,  No sneezing, itching, ear ache, nasal congestion, post nasal drip,  Cardio-vascular:  No chest pain, Orthopnea, PND, anasarca, dizziness, palpitations.no Bilateral lower extremity swelling  GI:  No heartburn, indigestion, abdominal pain, nausea, vomiting, diarrhea, change in bowel habits, loss of appetite, melena, blood in stool, hematemesis Resp:  no shortness of breath at rest. No dyspnea on exertion, No excess mucus, no productive cough, No non-productive cough, No coughing up of blood.No change in color of mucus.No wheezing. Skin:  no rash or lesions. No jaundice GU:  no dysuria, change in color of urine, no urgency or frequency. No straining to urinate.  No flank pain.  Musculoskeletal:  No joint pain or no joint swelling. No decreased range of motion. No back pain.  Psych:  No change in mood or affect. No depression or anxiety. No memory loss.  Neuro: no localizing neurological complaints, no tingling, no weakness, no double vision, no gait abnormality, no slurred speech, no confusion  Otherwise ROS are negative except for above, 10 systems were reviewed  Past Medical History: Past Medical History  Diagnosis Date  . Hypertension   . Hyperlipidemia   . Tobacco abuse   . Lung nodule     Left lung, seen 2012, no change in 2012, no follow up needed   . PNA (pneumonia) 2010    Bilateral Pneumonia, COPD 61mm LLL Nodule   . History of ETT 09/1991     POS   . History of MRI 04-22-06  L/S- right hnp  l5/si   . B12 deficiency   . TIA (transient ischemic attack)   . GERD (gastroesophageal reflux disease)   . Esophageal stricture   . Hemorrhoids   . Colon polyps     hyperplastic  . Pneumonia   . Macular degeneration    Past Surgical History  Procedure Laterality Date  . Cystectomy  1995    Lumbar area      Medications: Prior  to Admission medications   Medication Sig Start Date End Date Taking? Authorizing Provider  albuterol (PROVENTIL HFA;VENTOLIN HFA) 108 (90 BASE) MCG/ACT inhaler Inhale 2 puffs into the lungs every 6 (six) hours as needed for wheezing or shortness of breath. 12/20/13  Yes Costin Karlyne Greenspan, MD  amLODipine (NORVASC) 10 MG tablet Take 1 tablet (10 mg total) by mouth daily. 11/13/14  Yes Tonia Ghent, MD  aspirin EC 81 MG tablet Take 81 mg by mouth daily.   Yes Historical Provider, MD  lisinopril (PRINIVIL,ZESTRIL) 20 MG tablet Take 1 tablet (20 mg total) by mouth daily. 11/13/14  Yes Tonia Ghent, MD  Multiple Vitamins-Minerals (PRESERVISION AREDS 2) CAPS Take as directed. Patient taking differently: Take 1 tablet by mouth 2 (two) times daily. Take as directed. 03/16/14  Yes Tonia Ghent, MD  simvastatin (ZOCOR) 20 MG tablet Take 1 tab by mouth at night 11/13/14  Yes Tonia Ghent, MD  tobramycin (TOBREX) 0.3 % ophthalmic solution Place 1 drop into both eyes 2 (two) times daily.  02/27/15  Yes Historical Provider, MD  pantoprazole (PROTONIX) 40 MG tablet Take 1 tablet every morning 30 minutes prior to breakfast. Patient not taking: Reported on 03/27/2015 03/25/15   Irene Shipper, MD    Allergies:  No Known Allergies  Social History:  Ambulatory independently with cane  Lives at home  With family     reports that he quit smoking about 29 years ago. His smoking use included Cigarettes. He has quit using smokeless tobacco. His smokeless tobacco use included Chew. He reports that he does not drink alcohol or use illicit drugs.    Family History: family history includes Heart attack in his father; Hypertension in his mother. There is no history of Colon cancer or Esophageal cancer.    Physical Exam: Patient Vitals for the past 24 hrs:  BP Temp Temp src Pulse Resp SpO2 Height Weight  03/27/15 1923 154/64 mmHg 98.5 F (36.9 C) Oral 98 16 90 % - -  03/27/15 1642 - - - - - - 5\' 5"  (1.651 m)  63.05 kg (139 lb)  03/27/15 1631 164/82 mmHg 98.3 F (36.8 C) Oral 77 20 93 % - -    1. General:  in No Acute distress 2. Psychological: Alert and   Oriented 3. Head/ENT:   Moist  Mucous Membranes                          Head Non traumatic, neck supple                          Normal   Dentition 4. SKIN:  decreased Skin turgor,  Skin clean Dry and intact no rash 5. Heart: Regular rate and rhythm no Murmur, Rub or gallop 6. Lungs: occasional mild wheezes no crackles   7. Abdomen: Soft, non-tender, Non distended 8. Lower extremities: no clubbing, cyanosis, or edema 9. Neurologically Grossly intact, moving all 4 extremities equally  10. MSK: Normal range of motion except for left extremity limited due to pain  body mass index is 23.13 kg/(m^2).   Labs on Admission:   Results for orders placed or performed during the hospital encounter of 03/27/15 (from the past 24 hour(s))  Basic metabolic panel     Status: Abnormal   Collection Time: 03/27/15  5:27 PM  Result Value Ref Range   Sodium 135 135 - 145 mmol/L   Potassium 4.5 3.5 - 5.1 mmol/L   Chloride 99 (L) 101 - 111 mmol/L   CO2 29 22 - 32 mmol/L   Glucose, Bld 121 (H) 65 - 99 mg/dL   BUN 20 6 - 20 mg/dL   Creatinine, Ser 1.08 0.61 - 1.24 mg/dL   Calcium 9.1 8.9 - 10.3 mg/dL   GFR calc non Af Amer 59 (L) >60 mL/min   GFR calc Af Amer >60 >60 mL/min   Anion gap 7 5 - 15  CBC with Differential/Platelet     Status: Abnormal   Collection Time: 03/27/15  5:27 PM  Result Value Ref Range   WBC 10.5 4.0 - 10.5 K/uL   RBC 5.04 4.22 - 5.81 MIL/uL   Hemoglobin 13.6 13.0 - 17.0 g/dL   HCT 42.4 39.0 - 52.0 %   MCV 84.1 78.0 - 100.0 fL   MCH 27.0 26.0 - 34.0 pg   MCHC 32.1 30.0 - 36.0 g/dL   RDW 14.6 11.5 - 15.5 %   Platelets 249 150 - 400 K/uL   Neutrophils Relative % 75 43 - 77 %   Neutro Abs 7.9 (H) 1.7 - 7.7 K/uL   Lymphocytes Relative 18 12 - 46 %   Lymphs Abs 1.9 0.7 - 4.0 K/uL   Monocytes Relative 6 3 - 12 %   Monocytes  Absolute 0.6 0.1 - 1.0 K/uL   Eosinophils Relative 1 0 - 5 %   Eosinophils Absolute 0.1 0.0 - 0.7 K/uL   Basophils Relative 0 0 - 1 %   Basophils Absolute 0.0 0.0 - 0.1 K/uL  Type and screen     Status: None   Collection Time: 03/27/15  6:33 PM  Result Value Ref Range   ABO/RH(D) O POS    Antibody Screen NEG    Sample Expiration 03/30/2015     UA ordered  Lab Results  Component Value Date   HGBA1C 6.2* 03/16/2011    Estimated Creatinine Clearance: 41.1 mL/min (by C-G formula based on Cr of 1.08).  BNP (last 3 results)  Recent Labs  07/31/14 2154  PROBNP 657.5*    Other results:  I have pearsonaly reviewed this: ECG REPORT  Rate: 81  Rhythm: SR ST&T Change: no ischemic changes   Filed Weights   03/27/15 1642  Weight: 63.05 kg (139 lb)     Cultures:    Component Value Date/Time   SDES STOOL 08/23/2014 1221   SPECREQUEST Normal 08/23/2014 1221   CULT  08/23/2014 1221    NO SALMONELLA, SHIGELLA, CAMPYLOBACTER, YERSINIA, OR E.COLI 0157:H7 ISOLATED Note: REDUCED NORMAL FLORA PRESENT Performed at [facility] 08/26/2014 FINAL 08/23/2014 1221     Radiological Exams on Admission: Dg Chest 1 View  03/27/2015   CLINICAL DATA:  Left hip fracture after fall today.  Preoperative.  EXAM: CHEST  1 VIEW  COMPARISON:  09/02/2014  FINDINGS: Mild emphysematous changes are present as well as chronic appearing basilar interstitial coarsening. There is a benign 5 x 7 mm left upper lobe  nodule which is unchanged since at least 10/28/2010. No acute infiltrate or congestive failure is evident.  IMPRESSION: No acute cardiopulmonary findings.   Electronically Signed   By: Andreas Newport M.D.   On: 03/27/2015 18:12   Dg Hip Unilat With Pelvis 2-3 Views Left  03/27/2015   CLINICAL DATA:  Left hip pain after fall today  EXAM: DG HIP (WITH OR WITHOUT PELVIS) 2-3V LEFT  COMPARISON:  None.  FINDINGS: There is a subcapital left hip fracture with mild valgus  angulation. There is no dislocation. There is no bone lesion or bony destruction to suggest a pathologic basis for the fracture.  IMPRESSION: Subcapital left hip fracture   Electronically Signed   By: Andreas Newport M.D.   On: 03/27/2015 18:07    Chart has been reviewed  Family   at  Bedside  plan of care was discussed with  Wife,  Joaquim Lai [phone]  Assessment/Plan  81 yo with hx of GERD, pulmonary hypertension, Diastolic dysfunction presents with mechanical fall resulting in left subcapital fracture.   Present on Admission:  . Hip fracture - operative intervention as per orthopedics. Given patient's advanced age and history of diastolic heart failure as well as pulmonary hypertension he is at least at moderate risk for cardiac complications. Given patient has been doing well able to ambulate his chest pain or shortness of breath no father cardiac workup is indicated prior to proceeding to a long at this time. Given history of A. fib with RVR in the past would benefit from perioperative beta blockade  . Esophageal dysmotility - she has undergone recent dilatation , GI has recommended treatment for H. pylori. Once patient able to tolerate by mouth this can be started continue Protonix IV  . Diastolic dysfunction with fluid overload  . Essential hypertension. Operative hold lisinopril will restart postoperatively    Prophylaxis: SCD   CODE STATUS:   DNR/DNI as per  family  Disposition:   likely will need placement for rehabilitation                           Other plan as per orders.  I have spent a total of 55 min on this admission  Aftin Lye 03/27/2015, 8:04 PM  [facility]  Pager [phone]   after 2 AM please page floor coverage PA If 7AM-7PM, please contact the day team taking care of the patient  [email or web address]  Password [email or web address]

## 2015-03-27 NOTE — Consult Note (Signed)
Reason for Consult:  Left femoral neck fracture Referring Physician: Dr. Sherron Monday is an 81 y.o. male.  HPI: 81 y/o male with PMH of heart failure fell earlier today at home.  He came [facility] ED via EMS c/o left hip pain.  He says the pain is dull and aching and hurts worse with motion.  It has been painful since his fall.  It is moderate at rest and severe with motion.  He denies any left hip surgery.  He denies any LOC.  No h/o diabetes.  No smoking.  He walks at home with a cane.  Past Medical History  Diagnosis Date  . Hypertension   . Hyperlipidemia   . Tobacco abuse   . Lung nodule     Left lung, seen 2012, no change in 2012, no follow up needed   . PNA (pneumonia) 2010    Bilateral Pneumonia, COPD 83m LLL Nodule   . History of ETT 09/1991     POS   . History of MRI 04-22-06    L/S- right hnp  l5/si   . B12 deficiency   . TIA (transient ischemic attack)   . GERD (gastroesophageal reflux disease)   . Esophageal stricture   . Hemorrhoids   . Colon polyps     hyperplastic  . Pneumonia   . Macular degeneration     Past Surgical History  Procedure Laterality Date  . Cystectomy  1995    Lumbar area     Family History  Problem Relation Age of Onset  . Hypertension Mother   . Heart attack Father   . Colon cancer Neg Hx   . Esophageal cancer Neg Hx     Social History:  reports that he quit smoking about 29 years ago. His smoking use included Cigarettes. He has quit using smokeless tobacco. His smokeless tobacco use included Chew. He reports that he does not drink alcohol or use illicit drugs.  Allergies: No Known Allergies  Medications: I have reviewed the patient's current medications.  Results for orders placed or performed during the hospital encounter of 03/27/15 (from the past 48 hour(s))  ABO/Rh     Status: None   Collection Time: 03/27/15  5:17 PM  Result Value Ref Range   ABO/RH(D) O POS   Basic metabolic panel     Status: Abnormal   Collection Time: 03/27/15  5:27 PM  Result Value Ref Range   Sodium 135 135 - 145 mmol/L   Potassium 4.5 3.5 - 5.1 mmol/L   Chloride 99 (L) 101 - 111 mmol/L   CO2 29 22 - 32 mmol/L   Glucose, Bld 121 (H) 65 - 99 mg/dL   BUN 20 6 - 20 mg/dL   Creatinine, Ser 1.08 0.61 - 1.24 mg/dL   Calcium 9.1 8.9 - 10.3 mg/dL   GFR calc non Af Amer 59 (L) >60 mL/min   GFR calc Af Amer >60 >60 mL/min    Comment: (NOTE) The eGFR has been calculated using the CKD EPI equation. This calculation has not been validated in all clinical situations. eGFR's persistently <60 mL/min signify possible Chronic Kidney Disease.    Anion gap 7 5 - 15  CBC with Differential/Platelet     Status: Abnormal   Collection Time: 03/27/15  5:27 PM  Result Value Ref Range   WBC 10.5 4.0 - 10.5 K/uL   RBC 5.04 4.22 - 5.81 MIL/uL   Hemoglobin 13.6 13.0 - 17.0 g/dL  HCT 42.4 39.0 - 52.0 %   MCV 84.1 78.0 - 100.0 fL   MCH 27.0 26.0 - 34.0 pg   MCHC 32.1 30.0 - 36.0 g/dL   RDW 14.6 11.5 - 15.5 %   Platelets 249 150 - 400 K/uL   Neutrophils Relative % 75 43 - 77 %   Neutro Abs 7.9 (H) 1.7 - 7.7 K/uL   Lymphocytes Relative 18 12 - 46 %   Lymphs Abs 1.9 0.7 - 4.0 K/uL   Monocytes Relative 6 3 - 12 %   Monocytes Absolute 0.6 0.1 - 1.0 K/uL   Eosinophils Relative 1 0 - 5 %   Eosinophils Absolute 0.1 0.0 - 0.7 K/uL   Basophils Relative 0 0 - 1 %   Basophils Absolute 0.0 0.0 - 0.1 K/uL  Type and screen     Status: None   Collection Time: 03/27/15  6:33 PM  Result Value Ref Range   ABO/RH(D) O POS    Antibody Screen NEG    Sample Expiration 03/30/2015     Dg Chest 1 View  03/27/2015   CLINICAL DATA:  Left hip fracture after fall today.  Preoperative.  EXAM: CHEST  1 VIEW  COMPARISON:  09/02/2014  FINDINGS: Mild emphysematous changes are present as well as chronic appearing basilar interstitial coarsening. There is a benign 5 x 7 mm left upper lobe nodule which is unchanged since at least 10/28/2010. No acute infiltrate or  congestive failure is evident.  IMPRESSION: No acute cardiopulmonary findings.   Electronically Signed   By: Andreas Newport M.D.   On: 03/27/2015 18:12   Dg Hip Unilat With Pelvis 2-3 Views Left  03/27/2015   CLINICAL DATA:  Left hip pain after fall today  EXAM: DG HIP (WITH OR WITHOUT PELVIS) 2-3V LEFT  COMPARISON:  None.  FINDINGS: There is a subcapital left hip fracture with mild valgus angulation. There is no dislocation. There is no bone lesion or bony destruction to suggest a pathologic basis for the fracture.  IMPRESSION: Subcapital left hip fracture   Electronically Signed   By: Andreas Newport M.D.   On: 03/27/2015 18:07    ROS:  No recent f/c/n/v/wt loss PE:  Blood pressure 154/64, pulse 98, temperature 98.5 F (36.9 C), temperature source Oral, resp. rate 16, height 5' 5" (1.651 m), weight 63.05 kg (139 lb), SpO2 90 %. wn elderly male in nad.  A and o x 4.  Mood and affect normal.  EOMI.  resp unlabored.  L hip with pain with motion.  Skin healthy and intact.  No l.ymphadenopathy.  5/5 strength in pF and DF of the ankle and toes.  Sens to LT intact at the foot.  Assessment/Plan: L femoral neck fracture - I explained the nature of this injury to the pt and his wife in detail.  He is being admitted by Dr. Roel Cluck.  He will require left hip hemiarthroplasty for this displaced femoral neck fracture.    Wylene Simmer 03/27/2015, 9:50 PM

## 2015-03-27 NOTE — ED Provider Notes (Signed)
CSN: [id number]     Arrival date & time 03/27/15  1604 History   First MD Initiated Contact with Patient 03/27/15 1633     Chief Complaint  Patient presents with  . Hip Pain     Patient is a 81 y.o. male presenting with hip pain. The history is provided by the patient.  Hip Pain This is a new problem. The current episode started less than 1 hour ago. The problem occurs constantly. The problem has been gradually worsening. Pertinent negatives include no chest pain, no abdominal pain and no headaches. The symptoms are aggravated by walking. The symptoms are relieved by rest.  pt presents after fall He reports he was at pharmacy and fell onto left hip No head injury No LOC No neck or back pain No cp/sob No abd pain   He had EGD done yesterday, no complications reported  Past Medical History  Diagnosis Date  . Hypertension   . Hyperlipidemia   . Tobacco abuse   . Lung nodule     Left lung, seen 2012, no change in 2012, no follow up needed   . PNA (pneumonia) 2010    Bilateral Pneumonia, COPD 27mm LLL Nodule   . History of ETT 09/1991     POS   . History of MRI 04-22-06    L/S- right hnp  l5/si   . B12 deficiency   . TIA (transient ischemic attack)   . GERD (gastroesophageal reflux disease)   . Esophageal stricture   . Hemorrhoids   . Colon polyps     hyperplastic  . Pneumonia   . Macular degeneration    Past Surgical History  Procedure Laterality Date  . Cystectomy  1995    Lumbar area    Family History  Problem Relation Age of Onset  . Hypertension Mother   . Heart attack Father   . Colon cancer Neg Hx   . Esophageal cancer Neg Hx    History  Substance Use Topics  . Smoking status: Former Smoker    Types: Cigarettes    Quit date: 08/29/1985  . Smokeless tobacco: Former Systems developer    Types: Chew  . Alcohol Use: No    Review of Systems  Constitutional: Negative for fever.  Cardiovascular: Negative for chest pain.  Gastrointestinal: Negative for abdominal pain.   Musculoskeletal: Positive for arthralgias. Negative for back pain and neck pain.  Neurological: Negative for headaches.  All other systems reviewed and are negative.     Allergies  Review of patient's allergies indicates no known allergies.  Home Medications   Prior to Admission medications   Medication Sig Start Date End Date Taking? Authorizing Provider  albuterol (PROVENTIL HFA;VENTOLIN HFA) 108 (90 BASE) MCG/ACT inhaler Inhale 2 puffs into the lungs every 6 (six) hours as needed for wheezing or shortness of breath. 12/20/13  Yes Costin Karlyne Greenspan, MD  amLODipine (NORVASC) 10 MG tablet Take 1 tablet (10 mg total) by mouth daily. 11/13/14  Yes Tonia Ghent, MD  aspirin EC 81 MG tablet Take 81 mg by mouth daily.   Yes Historical Provider, MD  lisinopril (PRINIVIL,ZESTRIL) 20 MG tablet Take 1 tablet (20 mg total) by mouth daily. 11/13/14  Yes Tonia Ghent, MD  Multiple Vitamins-Minerals (PRESERVISION AREDS 2) CAPS Take as directed. Patient taking differently: Take 1 tablet by mouth 2 (two) times daily. Take as directed. 03/16/14  Yes Tonia Ghent, MD  simvastatin (ZOCOR) 20 MG tablet Take 1 tab by mouth at night  11/13/14  Yes Tonia Ghent, MD  tobramycin (TOBREX) 0.3 % ophthalmic solution Place 1 drop into both eyes 2 (two) times daily.  02/27/15  Yes Historical Provider, MD  pantoprazole (PROTONIX) 40 MG tablet Take 1 tablet every morning 30 minutes prior to breakfast. Patient not taking: Reported on 03/27/2015 03/25/15   Irene Shipper, MD   BP 164/82 mmHg  Pulse 77  Temp(Src) 98.3 F (36.8 C) (Oral)  Resp 20  Ht 5\' 5"  (1.651 m)  Wt 139 lb (63.05 kg)  BMI 23.13 kg/m2  SpO2 93% Physical Exam CONSTITUTIONAL: Well developed/well nourished HEAD: Normocephalic/atraumatic EYES: EOMI/PERRL ENMT: Mucous membranes moist NECK: supple no meningeal signs SPINE/BACK:entire spine nontender CV: S1/S2 noted, no murmurs/rubs/gallops noted LUNGS: Lungs are clear to auscultation  bilaterally, no apparent distress ABDOMEN: soft, nontender, no rebound or guarding, bowel sounds noted throughout abdomen NEURO: Pt is awake/alert/appropriate, moves all extremitiesx4.   EXTREMITIES: pulses normal/equal, tenderness with ROM of left hip.  No other lower extremity tenderness noted.  Distal pulses intact on left LE SKIN: warm, color normal PSYCH: no abnormalities of mood noted, alert and oriented to situation  ED Course  Procedures  7:15 PM D/w dr hewitt Admit to medicine No operative repair tonight 8:37 PM D/w dr Roel Cluck Admit to medical service  Medications  fentaNYL (SUBLIMAZE) injection 50 mcg (50 mcg Intravenous Given 03/27/15 1825)  fentaNYL (SUBLIMAZE) injection 50 mcg (50 mcg Intravenous Given 03/27/15 1910)    Labs Review Labs Reviewed  BASIC METABOLIC PANEL - Abnormal; Notable for the following:    Chloride 99 (*)    Glucose, Bld 121 (*)    GFR calc non Af Amer 59 (*)    All other components within normal limits  CBC WITH DIFFERENTIAL/PLATELET - Abnormal; Notable for the following:    Neutro Abs 7.9 (*)    All other components within normal limits  URINALYSIS, ROUTINE W REFLEX MICROSCOPIC (NOT AT [facility])  TYPE AND SCREEN  ABO/RH    Imaging Review Dg Chest 1 View  03/27/2015   CLINICAL DATA:  Left hip fracture after fall today.  Preoperative.  EXAM: CHEST  1 VIEW  COMPARISON:  09/02/2014  FINDINGS: Mild emphysematous changes are present as well as chronic appearing basilar interstitial coarsening. There is a benign 5 x 7 mm left upper lobe nodule which is unchanged since at least 10/28/2010. No acute infiltrate or congestive failure is evident.  IMPRESSION: No acute cardiopulmonary findings.   Electronically Signed   By: Andreas Newport M.D.   On: 03/27/2015 18:12   Dg Hip Unilat With Pelvis 2-3 Views Left  03/27/2015   CLINICAL DATA:  Left hip pain after fall today  EXAM: DG HIP (WITH OR WITHOUT PELVIS) 2-3V LEFT  COMPARISON:  None.  FINDINGS: There is  a subcapital left hip fracture with mild valgus angulation. There is no dislocation. There is no bone lesion or bony destruction to suggest a pathologic basis for the fracture.  IMPRESSION: Subcapital left hip fracture   Electronically Signed   By: Andreas Newport M.D.   On: 03/27/2015 18:07     EKG Interpretation   Date/Time:  Friday March 27 2015 16:56:02 EDT Ventricular Rate:  81 PR Interval:  47 QRS Duration: 88 QT Interval:  361 QTC Calculation: 419 R Axis:   -3 Text Interpretation:  Sinus rhythm Atrial premature complexes Short PR  interval Abnormal R-wave progression, early transition artifact noted  Confirmed by Christy Gentles  MD, Treson Laura ([id number]) on 03/27/2015 5:35:50 PM  MDM   Final diagnoses:  Subcapital fracture of hip, left, closed, initial encounter    Nursing notes including past medical history and social history reviewed and considered in documentation xrays/imaging reviewed by myself and considered during evaluation Labs/vital reviewed myself and considered during evaluation     Ripley Fraise, MD 03/27/15 2038

## 2015-03-27 NOTE — ED Notes (Signed)
Patient transported to X-ray 

## 2015-03-27 NOTE — ED Notes (Signed)
Pt fell at pharmacy.  Landed on left hip.  Pain noted in hip.  No LOC or head injury.  Pt brought in by car.

## 2015-03-28 ENCOUNTER — Inpatient Hospital Stay (HOSPITAL_COMMUNITY): Payer: Medicare Other

## 2015-03-28 ENCOUNTER — Encounter (HOSPITAL_COMMUNITY): Payer: Self-pay | Admitting: Certified Registered Nurse Anesthetist

## 2015-03-28 ENCOUNTER — Encounter (HOSPITAL_COMMUNITY)
Admission: EM | Disposition: A | Discharge status: Skilled Nursing Facility | Payer: Self-pay | Source: Home / Self Care | Attending: Internal Medicine

## 2015-03-28 ENCOUNTER — Inpatient Hospital Stay (HOSPITAL_COMMUNITY): Payer: Medicare Other | Admitting: Anesthesiology

## 2015-03-28 DIAGNOSIS — K210 Gastro-esophageal reflux disease with esophagitis: Secondary | ICD-10-CM

## 2015-03-28 HISTORY — PX: HIP PINNING,CANNULATED: SHX1758

## 2015-03-28 LAB — BASIC METABOLIC PANEL
Anion gap: 9 (ref 5–15)
BUN: 19 mg/dL (ref 6–20)
CHLORIDE: 98 mmol/L — AB (ref 101–111)
CO2: 30 mmol/L (ref 22–32)
CREATININE: 1.01 mg/dL (ref 0.61–1.24)
Calcium: 9.1 mg/dL (ref 8.9–10.3)
GFR calc Af Amer: >60 mL/min (ref >60)
GFR calc non Af Amer: >60 mL/min (ref >60)
Glucose, Bld: 109 mg/dL — ABNORMAL HIGH (ref 65–99)
Potassium: 4.1 mmol/L (ref 3.5–5.1)
Sodium: 137 mmol/L (ref 135–145)

## 2015-03-28 LAB — URINALYSIS, ROUTINE W REFLEX MICROSCOPIC
Bilirubin Urine: NEGATIVE
GLUCOSE, UA: NEGATIVE mg/dL
Ketones, ur: 40 mg/dL — AB
LEUKOCYTES UA: NEGATIVE
Nitrite: NEGATIVE
Protein, ur: 100 mg/dL — AB
Specific Gravity, Urine: 1.018 (ref 1.005–1.030)
Urobilinogen, UA: 1 mg/dL (ref 0.0–1.0)
pH: 6 (ref 5.0–8.0)

## 2015-03-28 LAB — CBC
HEMATOCRIT: 40.5 % (ref 39.0–52.0)
HEMOGLOBIN: 12.9 g/dL — AB (ref 13.0–17.0)
MCH: 26.9 pg (ref 26.0–34.0)
MCHC: 31.9 g/dL (ref 30.0–36.0)
MCV: 84.4 fL (ref 78.0–100.0)
Platelets: 236 10*3/uL (ref 150–400)
RBC: 4.8 MIL/uL (ref 4.22–5.81)
RDW: 14.6 % (ref 11.5–15.5)
WBC: 12.6 10*3/uL — ABNORMAL HIGH (ref 4.0–10.5)

## 2015-03-28 LAB — SURGICAL PCR SCREEN
MRSA, PCR: NEGATIVE
Staphylococcus aureus: POSITIVE — AB

## 2015-03-28 LAB — URINE MICROSCOPIC-ADD ON

## 2015-03-28 LAB — ALBUMIN: Albumin: 3.5 g/dL (ref 3.5–5.0)

## 2015-03-28 SURGERY — FIXATION, FEMUR, NECK, PERCUTANEOUS, USING SCREW
Anesthesia: General | Site: Hip | Laterality: Left

## 2015-03-28 MED ORDER — ALUM & MAG HYDROXIDE-SIMETH 200-200-20 MG/5ML PO SUSP: 30.0000 mL | ORAL | Status: DC | PRN

## 2015-03-28 MED ORDER — CHLORHEXIDINE GLUCONATE CLOTH 2 % EX PADS
6.0000 | MEDICATED_PAD | Freq: Every day | CUTANEOUS | Status: AC
  Administered 2015-03-29 – 2015-04-01 (×4): 6 via TOPICAL

## 2015-03-28 MED ORDER — DOCUSATE SODIUM 100 MG PO CAPS
100.0000 mg | ORAL_CAPSULE | Freq: Two times a day (BID) | ORAL | Status: DC
  Administered 2015-03-28 – 2015-04-02 (×10): 100 mg via ORAL
  Filled 2015-03-28 (×10): qty 1

## 2015-03-28 MED ORDER — ONDANSETRON HCL 4 MG PO TABS: 4.0000 mg | ORAL_TABLET | Freq: Four times a day (QID) | ORAL | Status: DC | PRN

## 2015-03-28 MED ORDER — SODIUM CHLORIDE 0.9 % IV SOLN
10.0000 mg | INTRAVENOUS | Status: DC | PRN
  Administered 2015-03-28: 20 ug/min via INTRAVENOUS

## 2015-03-28 MED ORDER — ONDANSETRON HCL 4 MG/2ML IJ SOLN
INTRAMUSCULAR | Status: DC | PRN
  Administered 2015-03-28 (×2): 2 mg via INTRAVENOUS

## 2015-03-28 MED ORDER — LIDOCAINE HCL (CARDIAC) 20 MG/ML IV SOLN
INTRAVENOUS | Status: DC | PRN
  Administered 2015-03-28: 50 mg via INTRAVENOUS

## 2015-03-28 MED ORDER — METOCLOPRAMIDE HCL 10 MG PO TABS: 5.0000 mg | ORAL_TABLET | Freq: Three times a day (TID) | ORAL | Status: DC | PRN

## 2015-03-28 MED ORDER — FENTANYL CITRATE (PF) 100 MCG/2ML IJ SOLN
INTRAMUSCULAR | Status: DC | PRN
  Administered 2015-03-28 (×2): 25 ug via INTRAVENOUS
  Administered 2015-03-28: 50 ug via INTRAVENOUS

## 2015-03-28 MED ORDER — ACETAMINOPHEN 325 MG PO TABS: 650.0000 mg | ORAL_TABLET | Freq: Four times a day (QID) | ORAL | Status: DC | PRN

## 2015-03-28 MED ORDER — MUPIROCIN 2 % EX OINT
1.0000 "application " | TOPICAL_OINTMENT | Freq: Two times a day (BID) | CUTANEOUS | Status: AC
  Administered 2015-03-28 – 2015-04-01 (×10): 1 via NASAL
  Filled 2015-03-28: qty 22

## 2015-03-28 MED ORDER — FENTANYL CITRATE (PF) 100 MCG/2ML IJ SOLN
INTRAMUSCULAR | Status: AC
  Filled 2015-03-28: qty 2

## 2015-03-28 MED ORDER — ASPIRIN EC 325 MG PO TBEC
325.0000 mg | DELAYED_RELEASE_TABLET | Freq: Two times a day (BID) | ORAL | Status: DC
  Administered 2015-03-28 – 2015-04-02 (×10): 325 mg via ORAL
  Filled 2015-03-28 (×11): qty 1

## 2015-03-28 MED ORDER — PROPOFOL 10 MG/ML IV BOLUS
INTRAVENOUS | Status: AC
  Filled 2015-03-28: qty 20

## 2015-03-28 MED ORDER — LIDOCAINE HCL (CARDIAC) 20 MG/ML IV SOLN: INTRAVENOUS | Status: DC | PRN

## 2015-03-28 MED ORDER — PHENOL 1.4 % MT LIQD: 1.0000 | OROMUCOSAL | Status: DC | PRN

## 2015-03-28 MED ORDER — ACETAMINOPHEN 650 MG RE SUPP: 650.0000 mg | Freq: Four times a day (QID) | RECTAL | Status: DC | PRN

## 2015-03-28 MED ORDER — PROPOFOL 10 MG/ML IV BOLUS
INTRAVENOUS | Status: DC | PRN
  Administered 2015-03-28: 75 mg via INTRAVENOUS

## 2015-03-28 MED ORDER — MENTHOL 3 MG MT LOZG
1.0000 | LOZENGE | OROMUCOSAL | Status: DC | PRN
  Filled 2015-03-28: qty 9

## 2015-03-28 MED ORDER — CEFAZOLIN SODIUM-DEXTROSE 2-3 GM-% IV SOLR
INTRAVENOUS | Status: AC
  Filled 2015-03-28: qty 50

## 2015-03-28 MED ORDER — CEFAZOLIN SODIUM 1-5 GM-% IV SOLN
1.0000 g | Freq: Four times a day (QID) | INTRAVENOUS | Status: AC
  Administered 2015-03-28 – 2015-03-29 (×2): 1 g via INTRAVENOUS
  Filled 2015-03-28 (×2): qty 50

## 2015-03-28 MED ORDER — SUCCINYLCHOLINE CHLORIDE 20 MG/ML IJ SOLN
INTRAMUSCULAR | Status: DC | PRN
  Administered 2015-03-28: 80 mg via INTRAVENOUS

## 2015-03-28 MED ORDER — EPHEDRINE SULFATE 50 MG/ML IJ SOLN
INTRAMUSCULAR | Status: DC | PRN
  Administered 2015-03-28: 10 mg via INTRAVENOUS

## 2015-03-28 MED ORDER — FENTANYL CITRATE (PF) 100 MCG/2ML IJ SOLN: 25.0000 ug | INTRAMUSCULAR | Status: DC | PRN

## 2015-03-28 MED ORDER — LACTATED RINGERS IV SOLN
INTRAVENOUS | Status: DC | PRN
  Administered 2015-03-28 (×2): via INTRAVENOUS

## 2015-03-28 MED ORDER — METOCLOPRAMIDE HCL 5 MG/ML IJ SOLN: 5.0000 mg | Freq: Three times a day (TID) | INTRAMUSCULAR | Status: DC | PRN

## 2015-03-28 MED ORDER — MEPERIDINE HCL 50 MG/ML IJ SOLN: 6.2500 mg | INTRAMUSCULAR | Status: DC | PRN

## 2015-03-28 MED ORDER — METOPROLOL TARTRATE 1 MG/ML IV SOLN
2.5000 mg | Freq: Two times a day (BID) | INTRAVENOUS | Status: DC
  Administered 2015-03-28 – 2015-03-29 (×4): 2.5 mg via INTRAVENOUS
  Filled 2015-03-28 (×5): qty 5

## 2015-03-28 MED ORDER — MORPHINE SULFATE 2 MG/ML IJ SOLN: 0.5000 mg | INTRAMUSCULAR | Status: DC | PRN

## 2015-03-28 MED ORDER — ONDANSETRON HCL 4 MG/2ML IJ SOLN: 4.0000 mg | Freq: Four times a day (QID) | INTRAMUSCULAR | Status: DC | PRN

## 2015-03-28 MED ORDER — PROMETHAZINE HCL 25 MG/ML IJ SOLN: 6.2500 mg | INTRAMUSCULAR | Status: DC | PRN

## 2015-03-28 MED ORDER — 0.9 % SODIUM CHLORIDE (POUR BTL) OPTIME
TOPICAL | Status: DC | PRN
  Administered 2015-03-28: 1000 mL

## 2015-03-28 MED ORDER — HYDROCODONE-ACETAMINOPHEN 5-325 MG PO TABS: 1.0000 | ORAL_TABLET | Freq: Four times a day (QID) | ORAL | Status: DC | PRN

## 2015-03-28 SURGICAL SUPPLY — 58 items
BAG SPEC THK2 15X12 ZIP CLS (MISCELLANEOUS) ×2
BAG ZIPLOCK 12X15 (MISCELLANEOUS) ×4 IMPLANT
BLADE SAW SGTL 18X1.27X75 (BLADE) IMPLANT
BLADE SAW SGTL 18X1.27X75MM (BLADE)
BNDG GAUZE ELAST 4 BULKY (GAUZE/BANDAGES/DRESSINGS) ×4 IMPLANT
CLOSURE WOUND 1/2 X4 (GAUZE/BANDAGES/DRESSINGS)
COVER PERINEAL POST (MISCELLANEOUS) ×4 IMPLANT
DRAPE INCISE IOBAN 85X60 (DRAPES) ×4 IMPLANT
DRAPE ORTHO SPLIT 77X108 STRL (DRAPES)
DRAPE POUCH INSTRU U-SHP 10X18 (DRAPES) ×1 IMPLANT
DRAPE STERI IOBAN 125X83 (DRAPES) ×4 IMPLANT
DRAPE SURG 17X11 SM STRL (DRAPES) ×4 IMPLANT
DRAPE SURG ORHT 6 SPLT 77X108 (DRAPES) ×2 IMPLANT
DRAPE U-SHAPE 47X51 STRL (DRAPES) ×6 IMPLANT
DRSG AQUACEL AG ADV 3.5X 6 (GAUZE/BANDAGES/DRESSINGS) ×4 IMPLANT
DRSG AQUACEL AG ADV 3.5X10 (GAUZE/BANDAGES/DRESSINGS) ×1 IMPLANT
DRSG TEGADERM 4X4.75 (GAUZE/BANDAGES/DRESSINGS) ×1 IMPLANT
DURAPREP 26ML APPLICATOR (WOUND CARE) ×5 IMPLANT
ELECT BLADE TIP CTD 4 INCH (ELECTRODE) ×4 IMPLANT
ELECT REM PT RETURN 9FT ADLT (ELECTROSURGICAL) ×8
ELECTRODE REM PT RTRN 9FT ADLT (ELECTROSURGICAL) ×4 IMPLANT
EVACUATOR 1/8 PVC DRAIN (DRAIN) ×1 IMPLANT
FACESHIELD WRAPAROUND (MASK) ×8 IMPLANT
FACESHIELD WRAPAROUND OR TEAM (MASK) ×4 IMPLANT
GAUZE SPONGE 2X2 8PLY STRL LF (GAUZE/BANDAGES/DRESSINGS) IMPLANT
GLOVE BIOGEL PI IND STRL 7.5 (GLOVE) ×4 IMPLANT
GLOVE BIOGEL PI IND STRL 8.5 (GLOVE) IMPLANT
GLOVE BIOGEL PI INDICATOR 7.5 (GLOVE) ×4
GLOVE BIOGEL PI INDICATOR 8.5 (GLOVE)
GLOVE ECLIPSE 8.0 STRL XLNG CF (GLOVE) IMPLANT
GLOVE ORTHO TXT STRL SZ7.5 (GLOVE) ×9 IMPLANT
GLOVE SURG ORTHO 8.0 STRL STRW (GLOVE) ×1 IMPLANT
GOWN SPEC L3 XXLG W/TWL (GOWN DISPOSABLE) ×2 IMPLANT
GOWN STRL REUS W/TWL LRG LVL3 (GOWN DISPOSABLE) ×5 IMPLANT
HANDPIECE INTERPULSE COAX TIP (DISPOSABLE)
IMMOBILIZER KNEE 20 (SOFTGOODS)
IMMOBILIZER KNEE 20 THIGH 36 (SOFTGOODS) IMPLANT
KIT BASIN OR (CUSTOM PROCEDURE TRAY) ×5 IMPLANT
LIQUID BAND (GAUZE/BANDAGES/DRESSINGS) ×4 IMPLANT
MANIFOLD NEPTUNE II (INSTRUMENTS) ×5 IMPLANT
NS IRRIG 1000ML POUR BTL (IV SOLUTION) ×4 IMPLANT
PACK GENERAL/GYN (CUSTOM PROCEDURE TRAY) ×1 IMPLANT
PACK TOTAL JOINT (CUSTOM PROCEDURE TRAY) ×4 IMPLANT
PIN THREADED GUIDE ACE (PIN) ×12 IMPLANT
POSITIONER SURGICAL ARM (MISCELLANEOUS) ×8 IMPLANT
SCREW CANN 8.0 75MM (Screw) ×3 IMPLANT
SCREW CANN 8.0 80MM (Screw) ×3 IMPLANT
SCREW CANN 8.0 90MM (Screw) ×8 IMPLANT
SET HNDPC FAN SPRY TIP SCT (DISPOSABLE) IMPLANT
SPONGE GAUZE 2X2 STER 10/PKG (GAUZE/BANDAGES/DRESSINGS)
STRIP CLOSURE SKIN 1/2X4 (GAUZE/BANDAGES/DRESSINGS) ×2 IMPLANT
SUT ETHIBOND NAB CT1 #1 30IN (SUTURE) IMPLANT
SUT MNCRL AB 4-0 PS2 18 (SUTURE) ×5 IMPLANT
SUT VIC AB 1 CT1 36 (SUTURE) ×8 IMPLANT
SUT VIC AB 2-0 CT1 27 (SUTURE) ×4
SUT VIC AB 2-0 CT1 TAPERPNT 27 (SUTURE) ×4 IMPLANT
TOWEL OR 17X26 10 PK STRL BLUE (TOWEL DISPOSABLE) ×6 IMPLANT
TRAY FOLEY W/METER SILVER 14FR (SET/KITS/TRAYS/PACK) ×1 IMPLANT

## 2015-03-28 NOTE — Anesthesia Procedure Notes (Signed)
Procedure Name: Intubation Date/Time: 03/28/2015 2:11 PM Performed by: Ofilia Neas Pre-anesthesia Checklist: Patient identified, Timeout performed, Emergency Drugs available, Suction available and Patient being monitored Patient Re-evaluated:Patient Re-evaluated prior to inductionOxygen Delivery Method: Circle system utilized Preoxygenation: Pre-oxygenation with 100% oxygen Intubation Type: IV induction Ventilation: Mask ventilation without difficulty Laryngoscope Size: Mac and 3 Grade View: Grade II Tube type: Oral Tube size: 7.5 mm Number of attempts: 1 Airway Equipment and Method: Stylet Placement Confirmation: ETT inserted through vocal cords under direct vision,  positive ETCO2 and breath sounds checked- equal and bilateral Secured at: 21 cm Tube secured with: Tape Dental Injury: Teeth and Oropharynx as per pre-operative assessment

## 2015-03-28 NOTE — Anesthesia Preprocedure Evaluation (Addendum)
Anesthesia Evaluation  Patient identified by MRN, date of birth, ID band Patient awake    Reviewed: Allergy & Precautions, NPO status , Patient's Chart, lab work & pertinent test results, reviewed documented beta blocker date and time   Airway Mallampati: II   Neck ROM: Full    Dental  (+) Edentulous Lower, Partial Upper, Dental Advisory Given   Pulmonary pneumonia - (in past), resolved, former smoker,  breath sounds clear to auscultation        Cardiovascular hypertension, Pt. on medications Rhythm:Regular  ECHO 07/2014 EF 46%, mild diastolic dysfunction, mild to moderate Pulmonary HTN   Neuro/Psych TIA   GI/Hepatic Neg liver ROS, GERD-  Medicated,  Endo/Other    Renal/GU negative Renal ROS     Musculoskeletal   Abdominal (+)  Abdomen: soft.    Peds  Hematology 13/40  plts 236k   Anesthesia Other Findings   Reproductive/Obstetrics                        Anesthesia Physical Anesthesia Plan  ASA: III and emergent  Anesthesia Plan: General   Post-op Pain Management:    Induction:   Airway Management Planned: Oral ETT  Additional Equipment:   Intra-op Plan:   Post-operative Plan:   Informed Consent: I have reviewed the patients History and Physical, chart, labs and discussed the procedure including the risks, benefits and alternatives for the proposed anesthesia with the patient or authorized representative who has indicated his/her understanding and acceptance.     Plan Discussed with:   Anesthesia Plan Comments: (Will offer spinal, if not GA, patient desires GA)       Anesthesia Quick Evaluation

## 2015-03-28 NOTE — Transfer of Care (Signed)
Immediate Anesthesia Transfer of Care Note  Patient: [name]  Procedure(s) Performed: Procedure(s): CANNULATED HIP PINNING (Left)  Patient Location: PACU  Anesthesia Type:General  Level of Consciousness: awake, oriented, patient cooperative, lethargic and responds to stimulation  Airway & Oxygen Therapy: Patient Spontanous Breathing and Patient connected to T-piece oxygen  Post-op Assessment: Report given to RN, Post -op Vital signs reviewed and stable and Patient moving all extremities  Post vital signs: Reviewed and stable  Last Vitals:  Filed Vitals:   03/28/15 1500  BP:   Pulse:   Temp: 36.7 C  Resp:     Complications: No apparent anesthesia complications

## 2015-03-28 NOTE — Brief Op Note (Signed)
03/27/2015 - 03/28/2015  2:13 PM  PATIENT:  [name]  81 y.o. male  PRE-OPERATIVE DIAGNOSIS:  left non displaced femoral neck fracture  POST-OPERATIVE DIAGNOSIS:   left non displaced femoral neck fracture  PROCEDURE:  Procedure(s): CANNULATED HIP PINNING (Left)  Closed reduction percutaneous cannulated screw fixation  SURGEON:  Surgeon(s) and Role:    * [name], MD - Primary  PHYSICIAN ASSISTANT: None  ANESTHESIA:   general  EBL:   Minimal  BLOOD ADMINISTERED:none  DRAINS: none   LOCAL MEDICATIONS USED:  NONE  SPECIMEN:  No Specimen  DISPOSITION OF SPECIMEN:  N/A  COUNTS:  YES  TOURNIQUET:  * No tourniquets in log *  DICTATION: .Other Dictation: Dictation Number [id number]  PLAN OF CARE: Admit to inpatient   PATIENT DISPOSITION:  PACU - hemodynamically stable.   Delay start of Pharmacological VTE agent (>24hrs) due to surgical blood loss or risk of bleeding: no

## 2015-03-28 NOTE — Op Note (Signed)
NAMEHART, HAAS NO.:  [id number]  MEDICAL RECORD NO.:  [id number]  LOCATION:  1517                         FACILITY:  [facility]  PHYSICIAN:  Pietro Cassis. [name], M.D.  DATE OF BIRTH:  [birth year]  DATE OF PROCEDURE:  03/28/2015 DATE OF DISCHARGE:                              OPERATIVE REPORT   PREOPERATIVE DIAGNOSIS:  Non-displaced valgus impacted left femoral neck fracture.  POSTOPERATIVE DIAGNOSIS:  Non-displaced valgus impacted left femoral neck fracture.  PROCEDURE:  Closed reduction and percutaneous cannulated screw fixation of the left femoral neck fracture, utilizing Biomet screws x4.  SURGEON:  Pietro Cassis. [name], M.D.  ASSISTANT:  Surgical team.  ANESTHESIA:  General.  SPECIMEN:  None.  COMPLICATION:  None.  BLOOD LOSS:  Minimal.  INDICATION FOR PROCEDURE:  Mr. Bailey is an 81 year old male with medical comorbidities.  He was at the drug store and he unfortunately had a fall, landing on his left hip.  He had immediate onset of pain. He is brought to emergency room for evaluation, where radiographs revealed a nondisplaced valgus impacted left femoral neck fracture.  The patient was admitted to the Medical Service for fracture protocol.  He was seen and evaluated and discussed treatment options.  Based on the fracture pattern, has functional activity and help.  I felt that percutaneous screws would provide the best option for joint preservation.  Analysis seem to have a very shallow acetabulum, in an effort to reduce dislocation.  I felt this was a good option as well. Risks and benefits were discussed including nonunion, need for future surgery, risks of infection, DVT, also touched upon.  Consent was obtained for benefit of fracture management.  PROCEDURE IN DETAIL:  The patient was brought to operative theater. Once adequate anesthesia, preoperative antibiotics and Ancef administered, he was positioned supine on the fracture table.  His  right unaffected extremity was flexed and abducted out of the way with bony prominences padded.  The left foot was placed in the traction boot.  Gentle traction applied just for the positioning purposes alone.  Internal rotation was applied. Fluoroscopy was used to confirm maintenance of the valgus impacted fracture without displacement.  This was confirmed, we thus proceeded. The left hip was then prepped and draped in sterile fashion with a shower curtain technique.  A time-out was performed, identifying the patient, planned procedure, and extremity.  At this point, fluoroscopy was brought back to the field, identifying landmarks and orientation, lesser trochanter orientation, femoral neck in AP and lateral planes.  An incision was made laterally just distal to the lesser trochanter.  First guidewire was inserted into the center of the femur laterally and entered into the center of the femoral head on the inferior aspect of the femoral neck.  Once this was in place, I did place this more centralized tend to orient for the anterior and posterior proximal pins.  These were placed under fluoroscopic imaging and more parallel on the lateral view.  Once I confirmed the orientation of the screws, in AP and lateral planes I measured the depth.  The screws were then placed in the order of inferior first compressed laterally along the shafts of the femur. Then,  posterior and then anterior, and then I did place a fourth screw centrally to provide further fixation for this 82 year old male to allow for increased weightbearing earlier.  Final radiographs were obtained in AP and lateral planes, once I was satisfied with the orientation of the screws.  The hip wound was then irrigated with normal saline solution.  The iliotibial band reapproximated using #1 Vicryl.  The remainder was closed with 2-0 Vicryl and running 4-0 Monocryl.  The hip was then cleaned, dried, and dressed sterilely using  surgical glue and Aquacel dressing.  The patient was extubated and brought to the recovery room in stable condition tolerating the procedure well.  Probably have to be partial weightbearing for 4-6 weeks to make sure that were stable or more progressing.  Findings were reviewed with family.     Pietro Cassis [name], M.D.     MDO/MEDQ  D:  03/28/2015  T:  03/28/2015  Job:  [id number]

## 2015-03-28 NOTE — Progress Notes (Signed)
SLP Cancellation Note  Patient Details Name: [name] MRN: [id number] DOB: [birth year]   Cancelled treatment:       Reason Eval/Treat Not Completed: Patient at procedure or test/unavailable: ST to follow up tomorrow for PO readiness  Arvil Chaco MA, [place] Pathologist    Levi Aland 03/28/2015, 3:20 PM

## 2015-03-28 NOTE — Progress Notes (Addendum)
PROGRESS NOTE    [name] TDH:[id number] DOB: 01-May-1925 DOA: 03/27/2015 PCP: [name], MD  HPI/Brief narrative 81 year old male patient with history of HTN, HLD, former smoker, TIA, GERD complicated by esophagitis and peptic stricture status post dilatation by Dr. [name] on 03/25/15, admitted to [facility] on 03/27/15 following a mechanical fall at a drugstore leading to left hip fracture. Orthopedics was consulted and patient underwent surgical fixation on 03/28/15.   Assessment/Plan:  Left nondisplaced femoral neck fracture - Sustained status post mechanical fall. Patient at baseline ambulates at home with the help of a cane. He does not go out much. On 7/29, he was assisting his wife in getting some medications from the pharmacy and fell. Patient cannot remember details but denies LOC or head injury. - Status post closed reduction percutaneous cannulated screw fixation by orthopedics on 7/30 - Activity/weightbearing status, DVT prophylaxis, pain management and wound care per orthopedic service  Essential hypertension - Mildly uncontrolled. Continue amlodipine and resume home dose of lisinopril when allowed to take PO. Also started on IV Metoprolol perioperatively.  History of A. fib with RVR - Clinically in sinus rhythm. Patient on aspirin PTA. Probably not an anticoagulation candidate secondary to gait ataxia and frequent falls.  History of chronic diastolic CHF - Compensated  TIA - Continue aspirin  GERD complicated by esophagitis and peptic stricture status post dilatation, duodenitis - Continue Protonix - H pylori screening test: Negative - Continue regular diet.  Frequent falls - As per patient and spouse, has fallen about 4 times this year.    DVT prophylaxis: As per orthopedics.  Code Status: DO NOT RESUSCITATE  Family Communication: Discussed with spouse at bedside  Disposition Plan: DC possibly to SNF when medically  stable.  Consultants:  Orthopedics   Procedures:  closed reduction percutaneous cannulated screw fixation by orthopedics on 7/30  Antibiotics:  None   Subjective: Patient was seen prior to surgery this morning. Complained of left hip pain on movement. No chest pain, dyspnea or palpitations reported.   Objective: Filed Vitals:   03/28/15 0530 03/28/15 1500 03/28/15 1515 03/28/15 1530  BP: 167/84  163/81 140/73  Pulse: 82  92 89  Temp: 98.4 F (36.9 C) 98.1 F (36.7 C) 98.1 F (36.7 C)   TempSrc: Oral     Resp: 14  15 14   Height:      Weight:      SpO2: 93%  96% 96%    Intake/Output Summary (Last 24 hours) at 03/28/15 1540 Last data filed at 03/28/15 1518  Gross per 24 hour  Intake   1640 ml  Output    250 ml  Net   1390 ml   Filed Weights   03/27/15 1642  Weight: 63.05 kg (139 lb)     Exam:  General exam: Pleasant elderly male lying comfortably in bed.  Respiratory system: Clear. No increased work of breathing. Cardiovascular system: S1 & S2 heard, RRR. No JVD, murmurs, gallops, clicks or pedal edema. Gastrointestinal system: Abdomen is nondistended, soft and nontender. Normal bowel sounds heard. Central nervous system: Alert and oriented. No focal neurological deficits. Extremities: Symmetric 5 x 5 power except left lower extremity movements restricted by pain.   Data Reviewed: Basic Metabolic Panel:  Recent Labs Lab 03/27/15 1727 03/28/15 0528  NA 135 137  K 4.5 4.1  CL 99* 98*  CO2 29 30  GLUCOSE 121* 109*  BUN 20 19  CREATININE 1.08 1.01  CALCIUM 9.1 9.1  Liver Function Tests:  Recent Labs Lab 03/28/15 0528  ALBUMIN 3.5   No results for input(s): LIPASE, AMYLASE in the last 168 hours. No results for input(s): AMMONIA in the last 168 hours. CBC:  Recent Labs Lab 03/27/15 1727 03/28/15 0528  WBC 10.5 12.6*  NEUTROABS 7.9*  --   HGB 13.6 12.9*  HCT 42.4 40.5  MCV 84.1 84.4  PLT 249 236   Cardiac Enzymes: No results for  input(s): CKTOTAL, CKMB, CKMBINDEX, TROPONINI in the last 168 hours. BNP (last 3 results)  Recent Labs  07/31/14 2154  PROBNP 657.5*   CBG: No results for input(s): GLUCAP in the last 168 hours.  Recent Results (from the past 937 hour(s))  Helicobacter pylori screen-biopsy     Status: None   Collection Time: 03/25/15 10:15 AM  Result Value Ref Range Status   UREASE Negative Negative Final  Surgical pcr screen     Status: Abnormal   Collection Time: 03/27/15 10:43 PM  Result Value Ref Range Status   MRSA, PCR NEGATIVE NEGATIVE Final   Staphylococcus aureus POSITIVE (A) NEGATIVE Final    Comment:        The Xpert SA Assay (FDA approved for NASAL specimens in patients over 69 years of age), is one component of a comprehensive surveillance program.  Test performance has been validated by [facility] for patients greater than or equal to 4 year old. It is not intended to diagnose infection nor to guide or monitor treatment.         Studies: Dg Chest 1 View  03/27/2015   CLINICAL DATA:  Left hip fracture after fall today.  Preoperative.  EXAM: CHEST  1 VIEW  COMPARISON:  09/02/2014  FINDINGS: Mild emphysematous changes are present as well as chronic appearing basilar interstitial coarsening. There is a benign 5 x 7 mm left upper lobe nodule which is unchanged since at least 10/28/2010. No acute infiltrate or congestive failure is evident.  IMPRESSION: No acute cardiopulmonary findings.   Electronically Signed   By: Andreas Newport M.D.   On: 03/27/2015 18:12   Dg Hip Operative Unilat With Pelvis Left  03/28/2015   CLINICAL DATA:  Internal fixation left hip fracture.  EXAM: OPERATIVE left HIP (WITH PELVIS IF PERFORMED) 3 VIEWS  Fluoroscopy time 0 minutes 7.4 seconds.  TECHNIQUE: Fluoroscopic spot image(s) were submitted for interpretation post-operatively.  COMPARISON:  03/27/2015  FINDINGS: Examination demonstrates placement of 4 orthopedic screws from inferior to the  superior bridging patient's femoral neck fracture extending into the femoral head as hardware is intact. There is anatomic alignment about the fracture site. Recommend correlation with findings at the time of the procedure.  IMPRESSION: Internal fixation of left femoral neck fracture with hardware intact.   Electronically Signed   By: Marin Olp M.D.   On: 03/28/2015 15:10   Dg Hip Unilat With Pelvis 2-3 Views Left  03/27/2015   CLINICAL DATA:  Left hip pain after fall today  EXAM: DG HIP (WITH OR WITHOUT PELVIS) 2-3V LEFT  COMPARISON:  None.  FINDINGS: There is a subcapital left hip fracture with mild valgus angulation. There is no dislocation. There is no bone lesion or bony destruction to suggest a pathologic basis for the fracture.  IMPRESSION: Subcapital left hip fracture   Electronically Signed   By: Andreas Newport M.D.   On: 03/27/2015 18:07        Scheduled Meds: . amLODipine  10 mg Oral Daily  . chlorhexidine  60 mL  Topical Once  . Chlorhexidine Gluconate Cloth  6 each Topical Daily  . docusate sodium  100 mg Oral BID  . [START ON 03/29/2015] guaiFENesin  600 mg Oral BID  . metoprolol  2.5 mg Intravenous Q12H  . mupirocin ointment  1 application Nasal BID  . pantoprazole  40 mg Oral Daily  . simvastatin  20 mg Oral q1800  . tobramycin  1 drop Both Eyes BID   Continuous Infusions:   Active Problems:   Esophageal dysmotility   Diastolic dysfunction   Essential hypertension   Subcapital fracture of hip   Hip fracture    Time spent: 25 minutes.    Vernell Leep, MD, FACP, FHM. [facility] Pager [phone]  If 7PM-7AM, please contact night-coverage [email or web address] Password [email or web address] 03/28/2015, 3:40 PM    LOS: 1 day

## 2015-03-29 LAB — BASIC METABOLIC PANEL
Anion gap: 9 (ref 5–15)
BUN: 20 mg/dL (ref 6–20)
CO2: 29 mmol/L (ref 22–32)
Calcium: 8.5 mg/dL — ABNORMAL LOW (ref 8.9–10.3)
Chloride: 94 mmol/L — ABNORMAL LOW (ref 101–111)
Creatinine, Ser: 0.98 mg/dL (ref 0.61–1.24)
Glucose, Bld: 123 mg/dL — ABNORMAL HIGH (ref 65–99)
Potassium: 4.2 mmol/L (ref 3.5–5.1)
SODIUM: 132 mmol/L — AB (ref 135–145)

## 2015-03-29 LAB — CBC
HCT: 38 % — ABNORMAL LOW (ref 39.0–52.0)
Hemoglobin: 12.3 g/dL — ABNORMAL LOW (ref 13.0–17.0)
MCH: 27.5 pg (ref 26.0–34.0)
MCHC: 32.4 g/dL (ref 30.0–36.0)
MCV: 85 fL (ref 78.0–100.0)
PLATELETS: 189 10*3/uL (ref 150–400)
RBC: 4.47 MIL/uL (ref 4.22–5.81)
RDW: 14.8 % (ref 11.5–15.5)
WBC: 11.3 10*3/uL — ABNORMAL HIGH (ref 4.0–10.5)

## 2015-03-29 MED ORDER — LISINOPRIL 20 MG PO TABS
20.0000 mg | ORAL_TABLET | Freq: Every day | ORAL | Status: DC
  Administered 2015-03-30 – 2015-04-01 (×3): 20 mg via ORAL
  Filled 2015-03-29 (×4): qty 1

## 2015-03-29 MED ORDER — CHLORHEXIDINE GLUCONATE 0.12 % MT SOLN
15.0000 mL | Freq: Two times a day (BID) | OROMUCOSAL | Status: DC
  Administered 2015-03-29 – 2015-04-02 (×8): 15 mL via OROMUCOSAL
  Filled 2015-03-29 (×10): qty 15

## 2015-03-29 MED ORDER — CETYLPYRIDINIUM CHLORIDE 0.05 % MT LIQD
7.0000 mL | Freq: Two times a day (BID) | OROMUCOSAL | Status: DC
  Administered 2015-03-30 – 2015-04-01 (×6): 7 mL via OROMUCOSAL

## 2015-03-29 NOTE — Care Management Note (Signed)
Case Management Note  Patient Details  Name: [name] MRN: [id number] Date of Birth: 11/23/1924  Subjective/Objective:                   Hip surgery  Action/Plan:  Discharge planning  Expected Discharge Date:   (unknown)               Expected Discharge Plan:     In-House Referral:  Clinical Social Work  Discharge planning Services  CM Consult  Post Acute Care Choice:    Choice offered to:  NA  DME Arranged:  N/A DME Agency:     HH Arranged:  NA HH Agency:     Status of Service:  Completed, signed off  Medicare Important Message Given:    Date Medicare IM Given:    Medicare IM give by:    Date Additional Medicare IM Given:    Additional Medicare Important Message give by:     If discussed at [place] of Stay Meetings, dates discussed:    Additional Comments: CM spoke with patient and spouse at the bedside. Wife states they have decided the patient will be discharged to a SNF. She is unable to care for him alone at home. Concerned she may hurt him while attempting to assist him to the bathroom. She has selected a facility, [facility], and has called the facility and spoken with someone she knows there about placement. CSW notified of the patient/family's plan. Wife states she nor the patient have requested information about obtaining a power wheelchair. She states she would be unable to handle a powered wheelchair due to its weight.  Apolonio Schneiders, RN 03/29/2015, 11:49 AM

## 2015-03-29 NOTE — Evaluation (Signed)
Clinical/Bedside Swallow Evaluation Patient Details  Name: [name] MRN: [id number] Date of Birth: [birth year]  Today's Date: 03/29/2015 Time: SLP Start Time (ACUTE ONLY): 1446 SLP Stop Time (ACUTE ONLY): 1500 SLP Time Calculation (min) (ACUTE ONLY): 14 min  Past Medical History:  Past Medical History  Diagnosis Date  . Hypertension   . Hyperlipidemia   . Tobacco abuse   . Lung nodule     Left lung, seen 2012, no change in 2012, no follow up needed   . PNA (pneumonia) 2010    Bilateral Pneumonia, COPD 68mm LLL Nodule   . History of ETT 09/1991     POS   . History of MRI 04-22-06    L/S- right hnp  l5/si   . B12 deficiency   . TIA (transient ischemic attack)   . GERD (gastroesophageal reflux disease)   . Esophageal stricture   . Hemorrhoids   . Colon polyps     hyperplastic  . Pneumonia   . Macular degeneration    Past Surgical History:  Past Surgical History  Procedure Laterality Date  . Cystectomy  1995    Lumbar area    HPI:  81 yo male adm 03/27/15 with hip fx after fall--> s/p L hip pinning on 03/28/15 per [name], MD; PMhx:pna, dysphagia, diastolic dysfunction, HTN. Pt previously evaluated by SLP at bedside with concerns for primary esophageal component. Per pt/wife, pt had his esophagus stretched one day PTA.   Assessment / Plan / Recommendation Clinical Impression  Pt has mild, audible congestion at baseline that remains present after cued, productive cough. Throughout PO intake it does not appear to worsen, and he does not have any reflexive coughing. Swallow appears to occue swiftly. Recommend to continue with soft diet and thin liquids for now with brief SLP f/u to assess for tolerance versus need for additional testing.    Aspiration Risk  Moderate    Diet Recommendation Dysphagia 3 (Mech soft);Thin   Medication Administration: Whole meds with puree Compensations: Slow rate;Small sips/bites    Other  Recommendations Oral Care Recommendations:  Oral care BID   Follow Up Recommendations       Frequency and Duration min 2x/week  1 week   Pertinent Vitals/Pain n/a    SLP Swallow Goals     Swallow Study Prior Functional Status       General Other Pertinent Information: 81 yo male adm 03/27/15 with hip fx after fall--> s/p L hip pinning on 03/28/15 per [name], MD; PMhx:pna, dysphagia, diastolic dysfunction, HTN. Pt previously evaluated by SLP at bedside with concerns for primary esophageal component. Per pt/wife, pt had his esophagus stretched one day PTA. Type of Study: Bedside swallow evaluation Previous Swallow Assessment: see HPI Diet Prior to this Study: Dysphagia 3 (soft);Thin liquids Temperature Spikes Noted: No Respiratory Status: Supplemental O2 delivered via (comment) ([place]) History of Recent Intubation: Yes Length of Intubations (days):  (for procedure) Date extubated: 03/28/15 Behavior/Cognition: Alert;Cooperative;Pleasant mood Oral Cavity - Dentition: Adequate natural dentition/normal for age Self-Feeding Abilities: Able to feed self Patient Positioning: Upright in bed Baseline Vocal Quality: Normal Volitional Cough: Strong    Oral/Motor/Sensory Function Overall Oral Motor/Sensory Function: Appears within functional limits for tasks assessed   Ice Chips Ice chips: Not tested   Thin Liquid Thin Liquid: Impaired Presentation: Cup;Self Fed Pharyngeal  Phase Impairments: Suspected delayed Swallow    Nectar Thick Nectar Thick Liquid: Not tested   Honey Thick Honey Thick Liquid: Not tested   Puree Puree: Within  functional limits Presentation: Self Fed;Spoon   Solid    Solid: Within functional limits Presentation: Self Fed      Germain Osgood, M.A. CCC-SLP [phone]  Germain Osgood 03/29/2015,3:11 PM

## 2015-03-29 NOTE — Progress Notes (Signed)
Patient ID: OAKLEE SUNGA, male   DOB: 02-11-1925, 81 y.o.   MRN: [id number]    Subjective: 1 Day Post-Op Procedure(s) (LRB): CANNULATED HIP PINNING (Left) Patient reports pain as 3 on 0-10 scale.   Denies CP or SOB.  Voiding without difficulty. Positive flatus. Objective: Vital signs in last 24 hours: Temp:  [97.7 F (36.5 C)-98.9 F (37.2 C)] 98.7 F (37.1 C) (07/31 0542) Pulse Rate:  [62-92] 69 (07/31 0542) Resp:  [14-17] 16 (07/31 0542) BP: (130-166)/(60-84) 132/60 mmHg (07/31 0542) SpO2:  [91 %-98 %] 98 % (07/31 0542)  Intake/Output from previous day: 07/30 0701 - 07/31 0700 In: 2210 [P.O.:720; I.V.:1390; IV Piggyback:100] Out: 650 [Urine:650] Intake/Output this shift:    Labs:  Recent Labs  03/27/15 1727 03/28/15 0528 03/29/15 0503  HGB 13.6 12.9* 12.3*    Recent Labs  03/28/15 0528 03/29/15 0503  WBC 12.6* 11.3*  RBC 4.80 4.47  HCT 40.5 38.0*  PLT 236 189    Recent Labs  03/28/15 0528 03/29/15 0503  NA 137 132*  K 4.1 4.2  CL 98* 94*  CO2 30 29  BUN 19 20  CREATININE 1.01 0.98  GLUCOSE 109* 123*  CALCIUM 9.1 8.5*   No results for input(s): LABPT, INR in the last 72 hours.  Physical Exam: Neurologically intact ABD soft Neurovascular intact Sensation intact distally Intact pulses distally Dorsiflexion/Plantar flexion intact Incision: dressing C/D/I and no drainage No cellulitis present Compartment soft  Assessment/Plan: 1 Day Post-Op Procedure(s) (LRB): CANNULATED HIP PINNING (Left) Advance diet Up with therapy - continue mobilization  Nijel Flink, Darla Lesches for Dr. Melina Schools [facility] [phone] 03/29/2015, 8:24 AM

## 2015-03-29 NOTE — Progress Notes (Signed)
PROGRESS NOTE    [name] TIR:[id number] DOB: 06/17/1925 DOA: 03/27/2015 PCP: [name], MD  HPI/Brief narrative 81 year old male patient with history of HTN, HLD, former smoker, TIA, GERD complicated by esophagitis and peptic stricture status post dilatation by Dr. [name] on 03/25/15, admitted to [facility] on 03/27/15 following a mechanical fall at a drugstore leading to left hip fracture. Orthopedics was consulted and patient underwent surgical fixation on 03/28/15.   Assessment/Plan:  Left nondisplaced femoral neck fracture - Sustained status post mechanical fall. Patient at baseline ambulates at home with the help of a cane. He does not go out much. On 7/29, he was assisting his wife in getting some medications from the pharmacy and fell. Patient cannot remember details but denies LOC or head injury. - Status post closed reduction percutaneous cannulated screw fixation by orthopedics on 7/30 - Activity/weightbearing status, DVT prophylaxis, pain management and wound care per orthopedic service  Essential hypertension - Mildly uncontrolled. Continue amlodipine and resume home dose of lisinopril when allowed to take PO. Also started on IV Metoprolol perioperatively- DC.  History of A. fib with RVR - Clinically in sinus rhythm. Patient on aspirin PTA. Probably not an anticoagulation candidate secondary to gait ataxia and frequent falls.  History of chronic diastolic CHF - Compensated  TIA - Continue aspirin  GERD complicated by esophagitis and peptic stricture status post dilatation, duodenitis - Continue Protonix - H pylori screening test: Negative - Continue regular diet.  Frequent falls - As per patient and spouse, has fallen about 4 times this year.    DVT prophylaxis: As per orthopedics.  Code Status: DO NOT RESUSCITATE  Family Communication:  None at bedside  Disposition Plan: DC possibly to SNF when medically  stable.  Consultants:  Orthopedics   Procedures:  closed reduction percutaneous cannulated screw fixation by orthopedics on 7/30  Antibiotics:  None   Subjective: Mild appropriate pain on operated left hip. Denies any other complaints. No dyspnea or chest pain. As per nursing, no acute events.  Objective: Filed Vitals:   03/29/15 0203 03/29/15 0542 03/29/15 1011 03/29/15 1055  BP: 146/65 132/60 148/69 112/64  Pulse: 72 69 68 64  Temp: 98.9 F (37.2 C) 98.7 F (37.1 C)    TempSrc: Oral Oral    Resp: 16 16    Height:      Weight:      SpO2: 97% 98%      Intake/Output Summary (Last 24 hours) at 03/29/15 1101 Last data filed at 03/29/15 0500  Gross per 24 hour  Intake   2210 ml  Output    650 ml  Net   1560 ml   Filed Weights   03/27/15 1642  Weight: 63.05 kg (139 lb)     Exam:  General exam: Pleasant elderly male sitting up comfortably in bed eating breakfast this morning. Respiratory system: Clear. No increased work of breathing. Cardiovascular system: S1 & S2 heard, RRR. No JVD, murmurs, gallops, clicks or pedal edema. Gastrointestinal system: Abdomen is nondistended, soft and nontender. Normal bowel sounds heard. Central nervous system: Alert and oriented. No focal neurological deficits. Extremities: Symmetric 5 x 5 power except left lower extremity movements restricted by pain. Left hip surgical site dressing clean and dry.   Data Reviewed: Basic Metabolic Panel:  Recent Labs Lab 03/27/15 1727 03/28/15 0528 03/29/15 0503  NA 135 137 132*  K 4.5 4.1 4.2  CL 99* 98* 94*  CO2 29 30 29   GLUCOSE 121* 109*  123*  BUN 20 19 20   CREATININE 1.08 1.01 0.98  CALCIUM 9.1 9.1 8.5*   Liver Function Tests:  Recent Labs Lab 03/28/15 0528  ALBUMIN 3.5   No results for input(s): LIPASE, AMYLASE in the last 168 hours. No results for input(s): AMMONIA in the last 168 hours. CBC:  Recent Labs Lab 03/27/15 1727 03/28/15 0528 03/29/15 0503  WBC 10.5  12.6* 11.3*  NEUTROABS 7.9*  --   --   HGB 13.6 12.9* 12.3*  HCT 42.4 40.5 38.0*  MCV 84.1 84.4 85.0  PLT 249 236 189   Cardiac Enzymes: No results for input(s): CKTOTAL, CKMB, CKMBINDEX, TROPONINI in the last 168 hours. BNP (last 3 results)  Recent Labs  07/31/14 2154  PROBNP 657.5*   CBG: No results for input(s): GLUCAP in the last 168 hours.  Recent Results (from the past 947 hour(s))  Helicobacter pylori screen-biopsy     Status: None   Collection Time: 03/25/15 10:15 AM  Result Value Ref Range Status   UREASE Negative Negative Final  Surgical pcr screen     Status: Abnormal   Collection Time: 03/27/15 10:43 PM  Result Value Ref Range Status   MRSA, PCR NEGATIVE NEGATIVE Final   Staphylococcus aureus POSITIVE (A) NEGATIVE Final    Comment:        The Xpert SA Assay (FDA approved for NASAL specimens in patients over 59 years of age), is one component of a comprehensive surveillance program.  Test performance has been validated by [facility] for patients greater than or equal to 9 year old. It is not intended to diagnose infection nor to guide or monitor treatment.         Studies: Dg Chest 1 View  03/27/2015   CLINICAL DATA:  Left hip fracture after fall today.  Preoperative.  EXAM: CHEST  1 VIEW  COMPARISON:  09/02/2014  FINDINGS: Mild emphysematous changes are present as well as chronic appearing basilar interstitial coarsening. There is a benign 5 x 7 mm left upper lobe nodule which is unchanged since at least 10/28/2010. No acute infiltrate or congestive failure is evident.  IMPRESSION: No acute cardiopulmonary findings.   Electronically Signed   By: Andreas Newport M.D.   On: 03/27/2015 18:12   Dg Hip Operative Unilat With Pelvis Left  03/28/2015   CLINICAL DATA:  Internal fixation left hip fracture.  EXAM: OPERATIVE left HIP (WITH PELVIS IF PERFORMED) 3 VIEWS  Fluoroscopy time 0 minutes 7.4 seconds.  TECHNIQUE: Fluoroscopic spot image(s) were submitted  for interpretation post-operatively.  COMPARISON:  03/27/2015  FINDINGS: Examination demonstrates placement of 4 orthopedic screws from inferior to the superior bridging patient's femoral neck fracture extending into the femoral head as hardware is intact. There is anatomic alignment about the fracture site. Recommend correlation with findings at the time of the procedure.  IMPRESSION: Internal fixation of left femoral neck fracture with hardware intact.   Electronically Signed   By: Marin Olp M.D.   On: 03/28/2015 15:10   Dg Hip Unilat With Pelvis 2-3 Views Left  03/27/2015   CLINICAL DATA:  Left hip pain after fall today  EXAM: DG HIP (WITH OR WITHOUT PELVIS) 2-3V LEFT  COMPARISON:  None.  FINDINGS: There is a subcapital left hip fracture with mild valgus angulation. There is no dislocation. There is no bone lesion or bony destruction to suggest a pathologic basis for the fracture.  IMPRESSION: Subcapital left hip fracture   Electronically Signed   By: Andreas Newport  M.D.   On: 03/27/2015 18:07        Scheduled Meds: . amLODipine  10 mg Oral Daily  . aspirin EC  325 mg Oral BID  . Chlorhexidine Gluconate Cloth  6 each Topical Daily  . docusate sodium  100 mg Oral BID  . guaiFENesin  600 mg Oral BID  . metoprolol  2.5 mg Intravenous Q12H  . mupirocin ointment  1 application Nasal BID  . pantoprazole  40 mg Oral Daily  . simvastatin  20 mg Oral q1800  . tobramycin  1 drop Both Eyes BID   Continuous Infusions:   Active Problems:   Esophageal dysmotility   Diastolic dysfunction   Essential hypertension   Subcapital fracture of hip   Hip fracture    Time spent: 25 minutes.    Vernell Leep, MD, FACP, FHM. [facility] Pager [phone]  If 7PM-7AM, please contact night-coverage [email or web address] Password [email or web address] 03/29/2015, 11:01 AM    LOS: 2 days

## 2015-03-29 NOTE — Evaluation (Signed)
Physical Therapy Evaluation Patient Details Name: [name] MRN: [id number] DOB: 01-07-1925 Today's Date: 03/29/2015   History of Present Illness  81 yo male adm 03/27/15 with hip fx after fall--> s/p L hip pinning on 03/28/15 per [name], MD; PMhx:pna, dysphagia, diastolic dysfunction, HTN  Clinical Impression  Pt admitted with above diagnosis. Pt currently with functional limitations due to the deficits listed below (see PT Problem List). Pt will benefit from skilled PT to increase their independence and safety with mobility to allow discharge to the venue listed below. Will benefit from SNF, prefers [facility]      Follow Up Recommendations SNF    Equipment Recommendations  None recommended by PT    Recommendations for Other Services       Precautions / Restrictions Precautions Precautions: Fall Restrictions Weight Bearing Restrictions: No LLE Weight Bearing: Partial weight bearing LLE Partial Weight Bearing Percentage or Pounds: 50%      Mobility  Bed Mobility Overal bed mobility: Needs Assistance Bed Mobility: Supine to Sit     Supine to sit: Mod assist;HOB elevated;Max assist     General bed mobility comments: assist for LLE, bed pad used to assist lateral scoot, assist to bring trunk forward  Transfers Overall transfer level: Needs assistance Equipment used: Rolling walker (2 wheeled) Transfers: Sit to/from [facility] Sit to Stand: Mod assist;From elevated surface Stand pivot transfers: +2 physical assistance;+2 safety/equipment;Mod assist       General transfer comment: assist for balance, RW position, multi-modal cues to correct excessive trunk flexion, hand placement on walker, sequence; (RN assisting for +2)  Ambulation/Gait             General Gait Details: unable at this time  Stairs            Wheelchair Mobility    Modified Rankin (Stroke Patients Only)       Balance Overall balance assessment:  Needs assistance;History of Falls   Sitting balance-Leahy Scale: Fair       Standing balance-Leahy Scale: Poor                               Pertinent Vitals/Pain Pain Assessment: Faces Pain Score: 5  Pain Location: L hip Pain Descriptors / Indicators: Aching Pain Intervention(s): Limited activity within patient's tolerance;Monitored during session;Premedicated before session;Repositioned;Ice applied    Home Living Family/patient expects to be discharged to:: Skilled nursing facility Living Arrangements: Spouse/significant other                    Prior Function Level of Independence: Independent with assistive device(s)         Comments: amb with cane prior to adm     Hand Dominance        Extremity/Trunk Assessment   Upper Extremity Assessment: Defer to OT evaluation           Lower Extremity Assessment: Generalized weakness;LLE deficits/detail   LLE Deficits / Details: ankle grossly WFL,  knee and hip 2 to 2+/5, limited by pain     Communication   Communication: No difficulties  Cognition Arousal/Alertness: Awake/alert Behavior During Therapy: WFL for tasks assessed/performed Overall Cognitive Status: Within Functional Limits for tasks assessed                      General Comments      Exercises General Exercises - Lower Extremity Ankle Circles/Pumps: AROM;Both;10 reps Heel Slides:  AAROM;Left;10 reps      Assessment/Plan    PT Assessment Patient needs continued PT services  PT Diagnosis Difficulty walking;Generalized weakness;Acute pain   PT Problem List Decreased strength;Decreased range of motion;Decreased activity tolerance;Decreased balance;Decreased mobility  PT Treatment Interventions DME instruction;Gait training;Functional mobility training;Therapeutic activities;Patient/family education;Therapeutic exercise   PT Goals (Current goals can be found in the Care Plan section) Acute Rehab PT Goals Patient  Stated Goal: go to rehab, get better PT Goal Formulation: With patient Time For Goal Achievement: 04/04/15 Potential to Achieve Goals: Good    Frequency Min 3X/week   Barriers to discharge        Co-evaluation               End of Session   Activity Tolerance: Patient limited by fatigue;Patient limited by pain Patient left: in chair;with call bell/phone within reach;with nursing/sitter in room Nurse Communication: Mobility status         Time: 2820-6015 PT Time Calculation (min) (ACUTE ONLY): 32 min   Charges:   PT Evaluation $Initial PT Evaluation Tier I: 1 Procedure PT Treatments $Therapeutic Activity: 8-22 mins   PT G Codes:        Tamiya Colello 06-Apr-2015, 10:26 AM

## 2015-03-30 ENCOUNTER — Encounter (HOSPITAL_COMMUNITY): Payer: Self-pay | Admitting: Orthopedic Surgery

## 2015-03-30 ENCOUNTER — Inpatient Hospital Stay (HOSPITAL_COMMUNITY): Payer: Medicare Other

## 2015-03-30 DIAGNOSIS — D62 Acute posthemorrhagic anemia: Secondary | ICD-10-CM

## 2015-03-30 LAB — CBC
HEMATOCRIT: 35.7 % — AB (ref 39.0–52.0)
Hemoglobin: 11.6 g/dL — ABNORMAL LOW (ref 13.0–17.0)
MCH: 27.3 pg (ref 26.0–34.0)
MCHC: 32.5 g/dL (ref 30.0–36.0)
MCV: 84 fL (ref 78.0–100.0)
PLATELETS: 176 10*3/uL (ref 150–400)
RBC: 4.25 MIL/uL (ref 4.22–5.81)
RDW: 14.7 % (ref 11.5–15.5)
WBC: 11 10*3/uL — AB (ref 4.0–10.5)

## 2015-03-30 LAB — BASIC METABOLIC PANEL
ANION GAP: 9 (ref 5–15)
BUN: 23 mg/dL — ABNORMAL HIGH (ref 6–20)
CALCIUM: 8.2 mg/dL — AB (ref 8.9–10.3)
CO2: 26 mmol/L (ref 22–32)
Chloride: 95 mmol/L — ABNORMAL LOW (ref 101–111)
Creatinine, Ser: 0.99 mg/dL (ref 0.61–1.24)
GFR calc Af Amer: >60 mL/min (ref >60)
GLUCOSE: 104 mg/dL — AB (ref 65–99)
Potassium: 4.3 mmol/L (ref 3.5–5.1)
Sodium: 130 mmol/L — ABNORMAL LOW (ref 135–145)

## 2015-03-30 LAB — VITAMIN D 25 HYDROXY (VIT D DEFICIENCY, FRACTURES): Vit D, 25-Hydroxy: 33.3 ng/mL (ref 30.0–100.0)

## 2015-03-30 MED ORDER — POLYETHYLENE GLYCOL 3350 17 G PO PACK: 17.0000 g | PACK | Freq: Every day | ORAL | Status: DC | PRN

## 2015-03-30 MED ORDER — ASPIRIN EC 325 MG PO TBEC: 325.0000 mg | DELAYED_RELEASE_TABLET | Freq: Two times a day (BID) | ORAL | Status: DC

## 2015-03-30 MED ORDER — HYDROCODONE-ACETAMINOPHEN 5-325 MG PO TABS: 1.0000 | ORAL_TABLET | Freq: Four times a day (QID) | ORAL | Status: DC | PRN

## 2015-03-30 MED ORDER — PRESERVISION AREDS 2 PO CAPS: 1.0000 | ORAL_CAPSULE | Freq: Two times a day (BID) | ORAL | Status: DC

## 2015-03-30 MED ORDER — BISACODYL 10 MG RE SUPP: 10.0000 mg | Freq: Every day | RECTAL | Status: DC | PRN

## 2015-03-30 MED ORDER — FUROSEMIDE 10 MG/ML IJ SOLN
40.0000 mg | Freq: Once | INTRAMUSCULAR | Status: AC
  Administered 2015-03-30: 40 mg via INTRAVENOUS
  Filled 2015-03-30 (×2): qty 4

## 2015-03-30 MED ORDER — DOCUSATE SODIUM 100 MG PO CAPS: 100.0000 mg | ORAL_CAPSULE | Freq: Two times a day (BID) | ORAL | Status: DC

## 2015-03-30 NOTE — Evaluation (Signed)
Occupational Therapy Evaluation Patient Details Name: [name] MRN: [id number] DOB: April 27, 1925 Today's Date: 03/30/2015    History of Present Illness 81 yo male adm 03/27/15 with hip fx after fall--> s/p L hip pinning on 03/28/15 per [name], MD; PMhx:pna, dysphagia, diastolic dysfunction, HTN   Clinical Impression   Pt admitted with above.  He presents to OT with generalized weakness an acute pain.  He requires max - total A for LB ADL.  He will benefit from continued OT at SNF to address the below listed deficits and allow him to regain max independence with ADLs.  All further OT needs can be addressed at SNF, acute OT will sign off at this time.     Follow Up Recommendations  SNF    Equipment Recommendations  None recommended by OT    Recommendations for Other Services       Precautions / Restrictions Precautions Precautions: Fall Restrictions Weight Bearing Restrictions: Yes LLE Weight Bearing: Partial weight bearing LLE Partial Weight Bearing Percentage or Pounds: 50%      Mobility Bed Mobility               General bed mobility comments: sitting up in recliner   Transfers                      Balance Overall balance assessment: Needs assistance Sitting-balance support: Feet supported Sitting balance-Leahy Scale: Fair                                      ADL Overall ADL's : Needs assistance/impaired Eating/Feeding: Independent;Sitting   Grooming: Wash/dry hands;Wash/dry face;Oral care;Brushing hair;Set up;Sitting   Upper Body Bathing: Minimal assitance;Sitting   Lower Body Bathing: Maximal assistance;Sitting/lateral leans   Upper Body Dressing : Minimal assistance;Sitting   Lower Body Dressing: Total assistance;Sitting/lateral leans   Toilet Transfer: +2 for physical assistance;Stand-pivot;BSC;Moderate assistance   Toileting- Clothing Manipulation and Hygiene: Total assistance;Sitting/lateral lean        Functional mobility during ADLs: +2 for physical assistance;Moderate assistance General ADL Comments: Pt is very motivated, but does fatigue      Vision     Perception     Praxis      Pertinent Vitals/Pain Pain Assessment: Faces Faces Pain Scale: Hurts little more Pain Location: Lt hip  Pain Descriptors / Indicators: Aching Pain Intervention(s): Monitored during session     Hand Dominance     Extremity/Trunk Assessment Upper Extremity Assessment Upper Extremity Assessment: Generalized weakness   Lower Extremity Assessment Lower Extremity Assessment: Defer to PT evaluation       Communication Communication Communication: No difficulties   Cognition Arousal/Alertness: Awake/alert Behavior During Therapy: WFL for tasks assessed/performed Overall Cognitive Status: Within Functional Limits for tasks assessed                     General Comments       Exercises Exercises: Other exercises Other Exercises Other Exercises: performed 5 reps chair push ups    Shoulder Instructions      Home Living Family/patient expects to be discharged to:: Skilled nursing facility                                        Prior Functioning/Environment Level of Independence: Independent with assistive device(s)  Comments: amb with cane prior to adm    OT Diagnosis: Generalized weakness;Acute pain   OT Problem List: Decreased activity tolerance   OT Treatment/Interventions:      OT Goals(Current goals can be found in the care plan section) Acute Rehab OT Goals Patient Stated Goal: to go to rehab and regain strength  OT Goal Formulation: All assessment and education complete, DC therapy  OT Frequency:     Barriers to D/C:            Co-evaluation              End of Session Equipment Utilized During Treatment: Oxygen Nurse Communication: Mobility status  Activity Tolerance: Patient limited by fatigue;Patient limited by  pain Patient left: in chair;with call bell/phone within reach;with family/visitor present   Time: 7741-4239 OT Time Calculation (min): 27 min Charges:  OT General Charges $OT Visit: 1 Procedure OT Evaluation $Initial OT Evaluation Tier I: 1 Procedure OT Treatments $Therapeutic Activity: 8-22 mins G-Codes:    Josselyne Onofrio M 04/04/15, 11:41 AM

## 2015-03-30 NOTE — Progress Notes (Signed)
Addendum  Chest x-ray suggestive of CHF. We will give dose of Lasix IV 40 mg 1 and monitor strict I and O's  Assessment: Acute hypoxic respiratory failure secondary to acute diastolic CHF. Echo from December 2015 appreciated.  Cancel discharge for today. Hopefully can DC to SNF 8/2. Discussed with patient's nursing.  Vernell Leep, MD, FACP, FHM. [facility] Pager [phone]  If 7PM-7AM, please contact night-coverage [email or web address] Password [facility] 03/30/2015, 3:19 PM

## 2015-03-30 NOTE — Anesthesia Postprocedure Evaluation (Signed)
  Anesthesia Post-op Note  Patient: [name]  Procedure(s) Performed: Procedure(s): CANNULATED HIP PINNING (Left)  Patient Location: PACU  Anesthesia Type:General  Level of Consciousness: awake  Airway and Oxygen Therapy: Patient Spontanous Breathing  Post-op Pain: mild  Post-op Assessment: Post-op Vital signs reviewed, Patient's Cardiovascular Status Stable, Respiratory Function Stable, Patent Airway and No signs of Nausea or vomiting              Post-op Vital Signs: Reviewed  Last Vitals:  Filed Vitals:   03/30/15 0606  BP: 118/64  Pulse: 87  Temp: 37.2 C  Resp: 13    Complications: No apparent anesthesia complications

## 2015-03-30 NOTE — Clinical Social Work Placement (Signed)
   CLINICAL SOCIAL WORK PLACEMENT  NOTE  Date:  03/30/2015  Patient Details  Name: [name] MRN: [id number] Date of Birth: [birth year]  Clinical Social Work is seeking post-discharge placement for this patient at the [place] level of care (*CSW will initial, date and re-position this form in  chart as items are completed):  Yes   Patient/family provided with [place] Work Department's list of facilities offering this level of care within the geographic area requested by the patient (or if unable, by the patient's family).  Yes   Patient/family informed of their freedom to choose among providers that offer the needed level of care, that participate in Medicare, Medicaid or managed care program needed by the patient, have an available bed and are willing to accept the patient.  Yes   Patient/family informed of [facility]'s ownership interest in [facility] and [facility], as well as of the fact that they are under no obligation to receive care at these facilities.  PASRR submitted to EDS on       PASRR number received on       Existing PASRR number confirmed on 03/29/15     FL2 transmitted to all facilities in geographic area requested by pt/family on 03/30/15     FL2 transmitted to all facilities within larger geographic area on       Patient informed that his/her managed care company has contracts with or will negotiate with certain facilities, including the following:        Yes   Patient/family informed of bed offers received.  Patient chooses bed at [facility]     Physician recommends and patient chooses bed at      Patient to be transferred to [facility] on 03/30/15.  Patient to be transferred to facility by [facility]     Patient family notified on   of transfer.  Name of family member notified:        PHYSICIAN       Additional Comment:    _______________________________________________ Boone Master,  [place] 03/30/2015, 10:09 AM

## 2015-03-30 NOTE — Care Management Important Message (Signed)
Important Message  Patient Details  Name: [name] MRN: [id number] Date of Birth: [birth year]   Medicare Important Message Given:  Yes-second notification given    Shelda Altes 03/30/2015, 3:42 [place] Message  Patient Details  Name: [name] MRN: [id number] Date of Birth: July 07, 1925   Medicare Important Message Given:  Yes-second notification given    Shelda Altes 03/30/2015, 3:42 PM

## 2015-03-30 NOTE — Discharge Instructions (Signed)
Keep left thigh wounds dry Maintain surgical dressing until follow up in our office in 2 weeks  PWB, LLE

## 2015-03-30 NOTE — Progress Notes (Signed)
Patient ID: [name], male   DOB: 06-03-1925, 81 y.o.   MRN: [id number] Subjective: 2 Days Post-Op Procedure(s) (LRB): CANNULATED HIP PINNING (Left)    Patient reports pain as mild.  Still recognizes some soreness with activity.  Only been in chair no significant walking yet  Objective:   VITALS:   Filed Vitals:   03/30/15 0606  BP: 118/64  Pulse: 87  Temp: 99 F (37.2 C)  Resp: 13    Neurovascular intact Incision: dressing C/D/I  LABS  Recent Labs  03/28/15 0528 03/29/15 0503 03/30/15 0444  HGB 12.9* 12.3* 11.6*  HCT 40.5 38.0* 35.7*  WBC 12.6* 11.3* 11.0*  PLT 236 189 176     Recent Labs  03/28/15 0528 03/29/15 0503 03/30/15 0444  NA 137 132* 130*  K 4.1 4.2 4.3  BUN 19 20 23*  CREATININE 1.01 0.98 0.99  GLUCOSE 109* 123* 104*    No results for input(s): LABPT, INR in the last 72 hours.   Assessment/Plan: 2 Days Post-Op Procedure(s) (LRB): CANNULATED HIP PINNING (Left)   Advance diet Up with therapy Discharge to SNF when medically cleared  RTC in 2 weeks PWB LLE

## 2015-03-30 NOTE — Progress Notes (Signed)
Attempt to wean patient off of oxygen.  Patients oxygen decreased from 2L Nasal cannula to 1L.  Patient maintaining saturation at 91% on 1L nasal cannula.  Patients uses IS with encouragement able to reach 1250.  Will continue to monitor.

## 2015-03-30 NOTE — Progress Notes (Addendum)
Clinical Social Work  CSW met with patient and son Inocente Salles) at bedside to discuss DC plans. Patient and family agreeable to SNF and prefer placement at [facility]. SNF agreeable to accept patient whenever medically stable. CSW faxed clinicals to [facility] for authorization. CSW will continue to follow.  [place], [place] [phone]  ADDENDUM [facility] Medicare authorization for SNF is [phone]. Patient can DC to SNF whenever medically stable.

## 2015-03-30 NOTE — Progress Notes (Signed)
Physical Therapy Treatment Patient Details Name: [name] MRN: [id number] DOB: 02-Nov-1924 Today's Date: 03/30/2015    History of Present Illness 81 yo male adm 03/27/15 with hip fx after fall--> s/p L hip pinning on 03/28/15 per [name], MD; PMhx:pna, dysphagia, diastolic dysfunction, HTN    PT Comments    POD # 2 assisted pt OOB to amb a limited distance + 2 assist and use of EVA walker.  Very weak and unsteady gait.  Poor forward flex posture and shuffled gait.  HIGH FALL RISK. Pt will need ST Rehab at SNF prior to returning home.  Follow Up Recommendations  SNF     Equipment Recommendations       Recommendations for Other Services       Precautions / Restrictions Precautions Precautions: Fall Precaution Comments: son states, pt tends to walk "hunched over" with his cane Restrictions Weight Bearing Restrictions: Yes LLE Weight Bearing: Partial weight bearing LLE Partial Weight Bearing Percentage or Pounds: 50%    Mobility  Bed Mobility Overal bed mobility: Needs Assistance Bed Mobility: Supine to Sit     Supine to sit: Mod assist;HOB elevated;Max assist     General bed mobility comments: utilized bed pad to scoot hips to transistion pt to EOB  Transfers Overall transfer level: Needs assistance Equipment used: Bilateral platform walker Transfers: Sit to/from Stand Sit to Stand: +2 physical assistance;Mod assist         General transfer comment: Used B platform EVA walker for increased support and assist with PWB.  50% VC's on proper tech and hand placement.  Assist with controlled decend.   Ambulation/Gait Ambulation/Gait assistance: Mod assist;+2 physical assistance Ambulation Distance (Feet): 18 Feet Assistive device: Bilateral platform walker Gait Pattern/deviations: Step-to pattern;Decreased step length - right;Decreased step length - left;Trunk flexed;Narrow base of support Gait velocity: decreased.   General Gait Details: Used B platform  EVA walker for increased support and to assist with PWB.  Required + 2 assist and son assisted by following with recliner.  Poor standing posture.  Flex forward. Shuffled gait.  Very unsteady.    Stairs            Wheelchair Mobility    Modified Rankin (Stroke Patients Only)       Balance                                    Cognition Arousal/Alertness: Awake/alert Behavior During Therapy: WFL for tasks assessed/performed Overall Cognitive Status: Within Functional Limits for tasks assessed                      Exercises      General Comments        Pertinent Vitals/Pain Pain Assessment: Faces Faces Pain Scale: Hurts little more Pain Location: L hip Pain Descriptors / Indicators: Aching;Sore Pain Intervention(s): Monitored during session;Repositioned;Ice applied    Home Living                      Prior Function            PT Goals (current goals can now be found in the care plan section) Progress towards PT goals: Progressing toward goals    Frequency  Min 3X/week    PT Plan      Co-evaluation             End of Session Equipment Utilized  During Treatment: Gait belt Activity Tolerance: Patient limited by fatigue Patient left: in chair;with call bell/phone within reach     Time: 1035-1100 PT Time Calculation (min) (ACUTE ONLY): 25 min  Charges:  $Gait Training: 8-22 mins $Therapeutic Activity: 8-22 mins                    G Codes:      Rica Koyanagi  PTA [facility]  Acute  Rehab Pager      [phone]

## 2015-03-30 NOTE — Progress Notes (Signed)
Clinical Social Work  Per MD, patient's DC has been canceled for today. CSW updated [employer] and [facility] [place] [facility] ([place] [phone]) and will continue to follow.  [place], [place] [phone]

## 2015-03-30 NOTE — Discharge Summary (Signed)
Physician Discharge Summary  [name] LKG:[id number] DOB: August 06, 1925 DOA: 03/27/2015  PCP: [name], MD  Admit date: 03/27/2015 Discharge date: 03/30/2015  Time spent: Greater than 30 minutes  Recommendations for Outpatient Follow-up:  1. M.D. at SNF in 3 days with repeat labs (CBC & BMP). 2. Dr. [name], PCP upon discharge from SNF. 3. Dr. [name], orthopedics in 2 weeks for postoperative follow-up including wound check and x-rays. 4. Partial weight bearing left lower extremity. Maintain surgical dressing until follow-up with orthopedics in 2 weeks. Keep left thigh wounds dry.  Discharge Diagnoses:  Active Problems:   Esophageal dysmotility   Diastolic dysfunction   Essential hypertension   Subcapital fracture of hip   Hip fracture   Discharge Condition: Improved & Stable  Diet recommendation: Heart healthy diet.  Filed Weights   03/27/15 1642  Weight: 63.05 kg (139 lb)    History of present illness:  81 year old male patient with history of HTN, HLD, former smoker, TIA, GERD complicated by esophagitis and peptic stricture status post dilatation by Dr. [name] on 03/25/15, admitted to [facility] on 03/27/15 following a mechanical fall at a drugstore leading to left hip fracture. Orthopedics was consulted and patient underwent surgical fixation on 03/28/15.  Hospital Course:   Left nondisplaced femoral neck fracture - Sustained status post mechanical fall. Patient at baseline ambulates at home with the help of a cane. He does not go out much. On 7/29, he was assisting his wife in getting some medications from the pharmacy and fell. Patient cannot remember details but denies LOC or head injury. - Status post closed reduction percutaneous cannulated screw fixation by orthopedics on 7/30 - Activity/weightbearing status (partial weight bearing on left lower extremity), DVT prophylaxis (aspirin 325 MG twice daily for 4 weeks postop), pain management  and wound care (keep current dressing until follow-up with orthopedics in 2 weeks) per orthopedic service - Discussed with Dr. [name] and patient cleared for discharge to SNF.  Essential hypertension - Controlled on amlodipine and lisinopril.  History of A. fib with RVR - Clinically in sinus rhythm. Patient on aspirin PTA. Probably not an anticoagulation candidate secondary to gait ataxia and frequent falls. - After patient completes DVT prophylactic dose aspirin after 4 weeks as recommended by orthopedics, patient is to resume aspirin 81 mg daily.   History of chronic diastolic CHF - Compensated  TIA - Continue aspirin  GERD complicated by esophagitis and peptic stricture status post dilatation, duodenitis - Continue Protonix - H pylori screening test: Negative - Continue regular diet.  Frequent falls - As per patient and spouse, has fallen about 4 times this year.  Mild acute hypoxic respiratory failure  - As per nursing, patient desaturated to 89% on room air with activity with physical therapy. Subsequently he was noted to have oxygen saturation in the low 80s without distress. He had a good cough with some production following which is oxygen saturations came up to the low 90s and patient is currently on 1 L/m oxygen via nasal cannula.  - Titrate off oxygen as tolerated. Incentive spirometry. - Mild hypoxemia is likely secondary to underlying COPD. Low index of suspicion for pneumonia or congestive heart failure. Will obtain chest x-ray prior to DC.  Mild acute blood loss anemia - Related to recent surgery. Stable. Follow CBC at SNF in a couple of days.  Mild hyponatremia - Stable.? SIADH related to pain. Follow BMP in a couple of days.    Consultants:  Orthopedics  Procedures:  Closed reduction percutaneous cannulated screw fixation by orthopedics on 7/30   Discharge Exam:  Complaints: Patient denied dyspnea, cough or chest pain when seen this morning. Had mild  appropriate pain at surgical site.  Filed Vitals:   03/29/15 1204 03/29/15 1451 03/29/15 2200 03/30/15 0606  BP: 123/51 149/67 117/56 118/64  Pulse:  74 62 87  Temp:  98 F (36.7 C) 98.1 F (36.7 C) 99 F (37.2 C)  TempSrc:  Oral Oral Oral  Resp:  16 12 13   Height:      Weight:      SpO2:   93% 93%    General exam: Pleasant elderly male sitting up comfortably in bed eating breakfast this morning. Respiratory system: Clear. No increased work of breathing. Cardiovascular system: S1 & S2 heard, RRR. No JVD, murmurs, gallops, clicks or pedal edema. Gastrointestinal system: Abdomen is nondistended, soft and nontender. Normal bowel sounds heard. Central nervous system: Alert and oriented. No focal neurological deficits. Extremities: Symmetric 5 x 5 power except left lower extremity movements restricted by pain. Left hip surgical site dressing clean and dry.  Discharge Instructions      Discharge Instructions    Call MD for:  difficulty breathing, headache or visual disturbances    Complete by:  As directed      Call MD for:  extreme fatigue    Complete by:  As directed      Call MD for:  persistant dizziness or light-headedness    Complete by:  As directed      Call MD for:  persistant nausea and vomiting    Complete by:  As directed      Call MD for:  redness, tenderness, or signs of infection (pain, swelling, redness, odor or green/yellow discharge around incision site)    Complete by:  As directed      Call MD for:  severe uncontrolled pain    Complete by:  As directed      Call MD for:  temperature >100.4    Complete by:  As directed      Diet - low sodium heart healthy    Complete by:  As directed      Discharge instructions    Complete by:  As directed   Oxygen via nasal cannula at 1-2 L/m. Titrate down to discontinue as long as his saturations stay above 90%. Incentive spirometry every 2 hourly while awake.     Increase activity slowly    Complete by:  As directed       Partial weight bearing    Complete by:  As directed   50%            Medication List    TAKE these medications        albuterol 108 (90 BASE) MCG/ACT inhaler  Commonly known as:  PROVENTIL HFA;VENTOLIN HFA  Inhale 2 puffs into the lungs every 6 (six) hours as needed for wheezing or shortness of breath.     amLODipine 10 MG tablet  Commonly known as:  NORVASC  Take 1 tablet (10 mg total) by mouth daily.     aspirin EC 325 MG tablet  Take 1 tablet (325 mg total) by mouth 2 (two) times daily. Take for 4 weeks     bisacodyl 10 MG suppository  Commonly known as:  DULCOLAX  Place 1 suppository (10 mg total) rectally daily as needed for moderate constipation.     docusate sodium 100 MG capsule  Commonly  known as:  COLACE  Take 1 capsule (100 mg total) by mouth 2 (two) times daily.     HYDROcodone-acetaminophen 5-325 MG per tablet  Commonly known as:  NORCO  Take 1-2 tablets by mouth every 6 (six) hours as needed.     lisinopril 20 MG tablet  Commonly known as:  PRINIVIL,ZESTRIL  Take 1 tablet (20 mg total) by mouth daily.     pantoprazole 40 MG tablet  Commonly known as:  PROTONIX  Take 1 tablet every morning 30 minutes prior to breakfast.     polyethylene glycol packet  Commonly known as:  MIRALAX / GLYCOLAX  Take 17 g by mouth daily as needed for mild constipation.     PRESERVISION AREDS 2 Caps  Take 1 tablet by mouth 2 (two) times daily. Take as directed.     simvastatin 20 MG tablet  Commonly known as:  ZOCOR  Take 1 tab by mouth at night     tobramycin 0.3 % ophthalmic solution  Commonly known as:  TOBREX  Place 1 drop into both eyes 2 (two) times daily.       Follow-up Information    Follow up with Mauri Pole, MD In 2 weeks.   Specialty:  Orthopedic Surgery   Why:  For wound check and X-rays   Contact information:   [address] [place] [address] [phone]       Follow up with M.D. at SNF. Schedule an appointment as  soon as possible for a visit in 3 days.   Why:  To be seen with repeat labs (CBC & BMP).      Follow up with [name], MD.   Specialty:  Family Medicine   Why:  Upon discharge from SNF.   Contact information:   [employer] [place] [address] [phone]        The results of significant diagnostics from this hospitalization (including imaging, microbiology, ancillary and laboratory) are listed below for reference.    Significant Diagnostic Studies: Dg Chest 1 View  03/27/2015   CLINICAL DATA:  Left hip fracture after fall today.  Preoperative.  EXAM: CHEST  1 VIEW  COMPARISON:  09/02/2014  FINDINGS: Mild emphysematous changes are present as well as chronic appearing basilar interstitial coarsening. There is a benign 5 x 7 mm left upper lobe nodule which is unchanged since at least 10/28/2010. No acute infiltrate or congestive failure is evident.  IMPRESSION: No acute cardiopulmonary findings.   Electronically Signed   By: Andreas Newport M.D.   On: 03/27/2015 18:12   Dg Hip Operative Unilat With Pelvis Left  03/28/2015   CLINICAL DATA:  Internal fixation left hip fracture.  EXAM: OPERATIVE left HIP (WITH PELVIS IF PERFORMED) 3 VIEWS  Fluoroscopy time 0 minutes 7.4 seconds.  TECHNIQUE: Fluoroscopic spot image(s) were submitted for interpretation post-operatively.  COMPARISON:  03/27/2015  FINDINGS: Examination demonstrates placement of 4 orthopedic screws from inferior to the superior bridging patient's femoral neck fracture extending into the femoral head as hardware is intact. There is anatomic alignment about the fracture site. Recommend correlation with findings at the time of the procedure.  IMPRESSION: Internal fixation of left femoral neck fracture with hardware intact.   Electronically Signed   By: Marin Olp M.D.   On: 03/28/2015 15:10   Dg Hip Unilat With Pelvis 2-3 Views Left  03/27/2015   CLINICAL DATA:  Left hip pain after fall today  EXAM: DG HIP (WITH OR  WITHOUT PELVIS) 2-3V LEFT  COMPARISON:  None.  FINDINGS: There is a subcapital left hip fracture with mild valgus angulation. There is no dislocation. There is no bone lesion or bony destruction to suggest a pathologic basis for the fracture.  IMPRESSION: Subcapital left hip fracture   Electronically Signed   By: Andreas Newport M.D.   On: 03/27/2015 18:07    Microbiology: Recent Results (from the past 811 hour(s))  Helicobacter pylori screen-biopsy     Status: None   Collection Time: 03/25/15 10:15 AM  Result Value Ref Range Status   UREASE Negative Negative Final  Surgical pcr screen     Status: Abnormal   Collection Time: 03/27/15 10:43 PM  Result Value Ref Range Status   MRSA, PCR NEGATIVE NEGATIVE Final   Staphylococcus aureus POSITIVE (A) NEGATIVE Final    Comment:        The Xpert SA Assay (FDA approved for NASAL specimens in patients over 79 years of age), is one component of a comprehensive surveillance program.  Test performance has been validated by [facility] for patients greater than or equal to 66 year old. It is not intended to diagnose infection nor to guide or monitor treatment.      Labs: Basic Metabolic Panel:  Recent Labs Lab 03/27/15 1727 03/28/15 0528 03/29/15 0503 03/30/15 0444  NA 135 137 132* 130*  K 4.5 4.1 4.2 4.3  CL 99* 98* 94* 95*  CO2 29 30 29 26   GLUCOSE 121* 109* 123* 104*  BUN 20 19 20  23*  CREATININE 1.08 1.01 0.98 0.99  CALCIUM 9.1 9.1 8.5* 8.2*   Liver Function Tests:  Recent Labs Lab 03/28/15 0528  ALBUMIN 3.5   No results for input(s): LIPASE, AMYLASE in the last 168 hours. No results for input(s): AMMONIA in the last 168 hours. CBC:  Recent Labs Lab 03/27/15 1727 03/28/15 0528 03/29/15 0503 03/30/15 0444  WBC 10.5 12.6* 11.3* 11.0*  NEUTROABS 7.9*  --   --   --   HGB 13.6 12.9* 12.3* 11.6*  HCT 42.4 40.5 38.0* 35.7*  MCV 84.1 84.4 85.0 84.0  PLT 249 236 189 176   Cardiac Enzymes: No results for  input(s): CKTOTAL, CKMB, CKMBINDEX, TROPONINI in the last 168 hours. BNP: BNP (last 3 results) No results for input(s): BNP in the last 8760 hours.  ProBNP (last 3 results)  Recent Labs  07/31/14 2154  PROBNP 657.5*    CBG: No results for input(s): GLUCAP in the last 168 hours.    Signed:  Vernell Leep, MD, FACP, FHM. [facility] Pager [phone]  If 7PM-7AM, please contact night-coverage [email or web address] Password [facility] 03/30/2015, 2:34 PM

## 2015-03-31 ENCOUNTER — Inpatient Hospital Stay (HOSPITAL_COMMUNITY): Payer: Medicare Other

## 2015-03-31 DIAGNOSIS — J9601 Acute respiratory failure with hypoxia: Secondary | ICD-10-CM

## 2015-03-31 DIAGNOSIS — I5033 Acute on chronic diastolic (congestive) heart failure: Secondary | ICD-10-CM

## 2015-03-31 LAB — CBC
HEMATOCRIT: 35.7 % — AB (ref 39.0–52.0)
Hemoglobin: 11.7 g/dL — ABNORMAL LOW (ref 13.0–17.0)
MCH: 27 pg (ref 26.0–34.0)
MCHC: 32.8 g/dL (ref 30.0–36.0)
MCV: 82.3 fL (ref 78.0–100.0)
PLATELETS: 188 10*3/uL (ref 150–400)
RBC: 4.34 MIL/uL (ref 4.22–5.81)
RDW: 14.3 % (ref 11.5–15.5)
WBC: 8.4 10*3/uL (ref 4.0–10.5)

## 2015-03-31 LAB — BASIC METABOLIC PANEL
Anion gap: 8 (ref 5–15)
BUN: 26 mg/dL — ABNORMAL HIGH (ref 6–20)
CO2: 31 mmol/L (ref 22–32)
CREATININE: 1.08 mg/dL (ref 0.61–1.24)
Calcium: 8.3 mg/dL — ABNORMAL LOW (ref 8.9–10.3)
Chloride: 90 mmol/L — ABNORMAL LOW (ref 101–111)
GFR calc non Af Amer: 59 mL/min — ABNORMAL LOW (ref >60)
Glucose, Bld: 105 mg/dL — ABNORMAL HIGH (ref 65–99)
Potassium: 3.8 mmol/L (ref 3.5–5.1)
SODIUM: 129 mmol/L — AB (ref 135–145)

## 2015-03-31 LAB — BRAIN NATRIURETIC PEPTIDE: B Natriuretic Peptide: 167.4 pg/mL — ABNORMAL HIGH (ref 0.0–100.0)

## 2015-03-31 MED ORDER — FUROSEMIDE 10 MG/ML IJ SOLN
40.0000 mg | Freq: Once | INTRAMUSCULAR | Status: AC
  Administered 2015-03-31: 40 mg via INTRAVENOUS
  Filled 2015-03-31: qty 4

## 2015-03-31 NOTE — Progress Notes (Signed)
Speech Language Pathology Treatment: Dysphagia  Patient Details Name: [name] MRN: [id number] DOB: [birth year] Today's Date: 03/31/2015 Time: 3254-9826 SLP Time Calculation (min) (ACUTE ONLY): 16 min  Assessment / Plan / Recommendation Clinical Impression  F/u after 7/31 clinical swallow evaluation - pt's D/C has been postponed until tomorrow.  He has audible congestion upon cough and exhalation.  Pt has known history of esophagitis and esophageal stricture; underwent endoscopy with dilatation 7/27 prior to fall and admission on the 29th for hip surgery.   Wife reports continued coughing intermittently with meals since surgery.  Today, thin liquids elicited immediate cough, wet phonation after consumption, concerning for aspiration.  Postural adjustments - HOB elevated higher and chin in neutral to lowered position - reduced symptoms.  Recommend SLP f/u next date to determine improved toleration - if not, may benefit from [facility] prior to D/C to SNF.     HPI Other Pertinent Information: 81 yo male adm 03/27/15 with hip fx after fall--> s/p L hip pinning on 03/28/15 per Chase Cancel, MD; PMhx:pna, dysphagia, diastolic dysfunction, HTN. Pt previously evaluated by SLP at bedside with concerns for primary esophageal component. Per pt/wife, pt had his esophagus stretched one day PTA.   Pertinent Vitals Pain Assessment: 0-10 Pain Score: 5  Pain Location: L hip Pain Descriptors / Indicators: Aching;Sore Pain Intervention(s): Monitored during session  SLP Plan  Continue with current plan of care    Recommendations Diet recommendations: Dysphagia 3 (mechanical soft);Thin liquid Liquids provided via: Cup Medication Administration: Whole meds with puree Supervision: Patient able to self feed Compensations: Slow rate;Small sips/bites;Chin tuck Postural Changes and/or Swallow Maneuvers: Seated upright 90 degrees              Oral Care Recommendations: Oral care BID Follow up Recommendations:  Skilled Nursing facility Plan: Continue with current plan of care    [name] L. [name], [place] CCC/SLP Pager [phone]      [name] 03/31/2015, 2:52 PM

## 2015-03-31 NOTE — Progress Notes (Signed)
Physical Therapy Treatment Patient Details Name: JADAN HINOJOS MRN: [id number] DOB: 04/05/1925 Today's Date: 03/31/2015    History of Present Illness 81 yo male adm 03/27/15 with hip fx after fall--> s/p L hip pinning on 03/28/15 per [name], MD; PMhx:pna, dysphagia, diastolic dysfunction, HTN    PT Comments    POD # 4 assisted OOB to amb a limited distance.  Used a RW this session and pt required increased assist and demonstrated decrease amb distance.  Difficulty assessing PWB.  Poor standing posture with severe forward flex trunk (nearly 90 degrees).  Recliner following for safety and needed.  Pt will need ST Rehab at SNF prior to returning home.   Follow Up Recommendations  SNF     Equipment Recommendations       Recommendations for Other Services       Precautions / Restrictions Precautions Precautions: Fall Precaution Comments: son states, pt tends to walk "hunched over" with his cane Restrictions Weight Bearing Restrictions: Yes LLE Weight Bearing: Partial weight bearing LLE Partial Weight Bearing Percentage or Pounds: 50%    Mobility  Bed Mobility Overal bed mobility: Needs Assistance Bed Mobility: Supine to Sit     Supine to sit: Mod assist;HOB elevated;Max assist     General bed mobility comments: utilized bed pad to scoot hips to transistion pt to EOB  Transfers Overall transfer level: Needs assistance Equipment used: Rolling walker (2 wheeled) Transfers: Sit to/from Stand   Stand pivot transfers: +2 physical assistance;+2 safety/equipment;Max assist;Mod assist       General transfer comment: used RW this time and pt required increased assit and VC's on upright posture.  Ambulation/Gait Ambulation/Gait assistance: Max assist;+2 safety/equipment;+2 physical assistance Ambulation Distance (Feet): 6 Feet Assistive device: Rolling walker (2 wheeled) Gait Pattern/deviations: Step-to pattern;Trunk flexed;Decreased stance time - left     General  Gait Details: used RW this time and pt required increased support and demonstarted decreased amb distance.  Pt had difficulty with sequencing and demon poor/severe forward flex posture (nearly 90 degrees).  Also great difficulty assessing PWB.  Recliner brought to pt from behind and assisted with stand to sit.     Stairs            Wheelchair Mobility    Modified Rankin (Stroke Patients Only)       Balance                                    Cognition Arousal/Alertness: Awake/alert Behavior During Therapy: WFL for tasks assessed/performed Overall Cognitive Status: Within Functional Limits for tasks assessed                      Exercises      General Comments        Pertinent Vitals/Pain Pain Assessment: 0-10 Pain Score: 5  Pain Location: L hip Pain Descriptors / Indicators: Aching;Sore Pain Intervention(s): Monitored during session    Home Living                      Prior Function            PT Goals (current goals can now be found in the care plan section) Progress towards PT goals: Progressing toward goals    Frequency  Min 3X/week    PT Plan      Co-evaluation  End of Session Equipment Utilized During Treatment: Gait belt Activity Tolerance: Patient limited by fatigue Patient left: in chair;with call bell/phone within reach     Time: 1125-1149 PT Time Calculation (min) (ACUTE ONLY): 24 min  Charges:  $Gait Training: 8-22 mins $Therapeutic Activity: 8-22 mins                    G Codes:      Rica Koyanagi  PTA [facility]  Acute  Rehab Pager      [phone]

## 2015-03-31 NOTE — Progress Notes (Signed)
Clinical Social Work  Per MD, patient is not medically stable to DC today. CSW informed [facility] and [place] and will continue to follow.  [place], [place] [phone]

## 2015-03-31 NOTE — Progress Notes (Addendum)
PROGRESS NOTE    [name] NLG:[id number] DOB: 10-16-1924 DOA: 03/27/2015 PCP: [name], MD  HPI/Brief narrative 81 year old male patient with history of HTN, HLD, former smoker, TIA, GERD complicated by esophagitis and peptic stricture status post dilatation by Dr. [name] on 03/25/15, admitted to [facility] on 03/27/15 following a mechanical fall at a drugstore leading to left hip fracture. Orthopedics was consulted and patient underwent surgical fixation on 03/28/15. Patient developed acute hypoxic respiratory failure secondary to acute diastolic CHF and when this has improved and stabilized, can DC to SNF. Will need to make addendum to dictated discharge summary of 03/30/15.   Assessment/Plan:  Left nondisplaced femoral neck fracture - Sustained status post mechanical fall. Patient at baseline ambulates at home with the help of a cane. He does not go out much. On 7/29, he was assisting his wife in getting some medications from the pharmacy and fell. Patient cannot remember details but denies LOC or head injury. - Status post closed reduction percutaneous cannulated screw fixation by orthopedics on 7/30 - Activity/weightbearing status (partial weight bearing on left lower extremity), DVT prophylaxis (aspirin 325 MG twice daily for 4 weeks postop), pain management and wound care (keep current dressing until follow-up with orthopedics in 2 weeks) per orthopedic service - Discussed with Dr. [name] 8/1 and patient cleared for discharge to SNF. Respiratory issue currently limiting patient's discharge.  Essential hypertension - Controlled on amlodipine and lisinopril.  History of A. fib with RVR - Clinically in sinus rhythm. Patient on aspirin PTA. Probably not an anticoagulation candidate secondary to gait ataxia and frequent falls. - After patient completes DVT prophylactic dose aspirin after 4 weeks as recommended by orthopedics, patient is to resume aspirin 81 mg daily.    Acute on chronic diastolic CHF - On 8/1, patient complained of cough and was hypoxic in the low 80s. Chest x-ray suggested pulmonary edema. Started IV Lasix and diuresed well yesterday. Continue additional day of IV Lasix and reassess in a.m. Improving. - Echo 08/02/14: LVEF 60-65 percent, grade 1 diastolic dysfunction. No ASP. Mild-to-moderate pulmonary hypertension. - EKG 7/29: Baseline tremor artifact but sinus rhythm and no acute changes appreciated.  TIA - Continue aspirin  GERD complicated by esophagitis and peptic stricture status post dilatation, duodenitis - Continue Protonix - H pylori screening test: Negative - Continue regular diet.  Frequent falls - As per patient and spouse, has fallen about 4 times this year.  Mild acute hypoxic respiratory failure  - Likely secondary to decompensated CHF. Titrate oxygen.  Mild acute blood loss anemia - Related to recent surgery. Stable. Follow CBC at SNF in a couple of days.  Mild hyponatremia - Stable.? SIADH related to pain. Follow BMP.    DVT prophylaxis: As per orthopedics.  Code Status: DO NOT RESUSCITATE  Family Communication:  None at bedside  Disposition Plan: DC possibly to SNF when medically stable.  Consultants:  Orthopedics   Procedures:  closed reduction percutaneous cannulated screw fixation by orthopedics on 7/30  Antibiotics:  None   Subjective: Dyspnea and cough have improved.  Objective: Filed Vitals:   03/30/15 2109 03/31/15 0528 03/31/15 0900 03/31/15 1400  BP: 123/61 130/61  158/58  Pulse: 78 74  82  Temp: 99.4 F (37.4 C) 98.4 F (36.9 C)  98.9 F (37.2 C)  TempSrc: Oral Oral  Oral  Resp: 14 16  18   Height:      Weight:      SpO2: 91% 90% 90% 97%  Intake/Output Summary (Last 24 hours) at 03/31/15 1555 Last data filed at 03/31/15 1400  Gross per 24 hour  Intake    120 ml  Output   2000 ml  Net  -1880 ml   Filed Weights   03/27/15 1642  Weight: 63.05 kg (139 lb)      Exam:  General exam: Pleasant elderly male sitting up comfortably in bed. Respiratory system: Occasional bibasal crackles. Otherwise clear to auscultation. No increased work of breathing. Cardiovascular system: S1 & S2 heard, RRR. No JVD, murmurs, gallops, clicks or pedal edema. Gastrointestinal system: Abdomen is nondistended, soft and nontender. Normal bowel sounds heard. Central nervous system: Alert and oriented. No focal neurological deficits. Extremities: Symmetric 5 x 5 power except left lower extremity movements restricted by pain. Left hip surgical site dressing clean and dry.   Data Reviewed: Basic Metabolic Panel:  Recent Labs Lab 03/27/15 1727 03/28/15 0528 03/29/15 0503 03/30/15 0444 03/31/15 0750  NA 135 137 132* 130* 129*  K 4.5 4.1 4.2 4.3 3.8  CL 99* 98* 94* 95* 90*  CO2 29 30 29 26 31   GLUCOSE 121* 109* 123* 104* 105*  BUN 20 19 20  23* 26*  CREATININE 1.08 1.01 0.98 0.99 1.08  CALCIUM 9.1 9.1 8.5* 8.2* 8.3*   Liver Function Tests:  Recent Labs Lab 03/28/15 0528  ALBUMIN 3.5   No results for input(s): LIPASE, AMYLASE in the last 168 hours. No results for input(s): AMMONIA in the last 168 hours. CBC:  Recent Labs Lab 03/27/15 1727 03/28/15 0528 03/29/15 0503 03/30/15 0444 03/31/15 0750  WBC 10.5 12.6* 11.3* 11.0* 8.4  NEUTROABS 7.9*  --   --   --   --   HGB 13.6 12.9* 12.3* 11.6* 11.7*  HCT 42.4 40.5 38.0* 35.7* 35.7*  MCV 84.1 84.4 85.0 84.0 82.3  PLT 249 236 189 176 188   Cardiac Enzymes: No results for input(s): CKTOTAL, CKMB, CKMBINDEX, TROPONINI in the last 168 hours. BNP (last 3 results)  Recent Labs  07/31/14 2154  PROBNP 657.5*   CBG: No results for input(s): GLUCAP in the last 168 hours.  Recent Results (from the past 810 hour(s))  Helicobacter pylori screen-biopsy     Status: None   Collection Time: 03/25/15 10:15 AM  Result Value Ref Range Status   UREASE Negative Negative Final  Surgical pcr screen     Status:  Abnormal   Collection Time: 03/27/15 10:43 PM  Result Value Ref Range Status   MRSA, PCR NEGATIVE NEGATIVE Final   Staphylococcus aureus POSITIVE (A) NEGATIVE Final    Comment:        The Xpert SA Assay (FDA approved for NASAL specimens in patients over 48 years of age), is one component of a comprehensive surveillance program.  Test performance has been validated by [facility] for patients greater than or equal to 28 year old. It is not intended to diagnose infection nor to guide or monitor treatment.         Studies: Dg Chest 2 View  03/31/2015   CLINICAL DATA:  Shortness breath and weakness.  Cough.  EXAM: CHEST - 2 VIEW  COMPARISON:  One-view chest x-ray 03/30/2015  FINDINGS: The heart is mildly enlarged. Interstitial coarsening is again noted, somewhat improved since the prior exam. Slight elevation of left hemidiaphragm is stable.  Degenerative changes are again seen in thoracic spine. Atherosclerotic calcifications are present in the aorta. No focal airspace consolidation or effusion is present.  IMPRESSION: 1. Improving aeration with  some persistence of interstitial prominence suggesting mild edema or pulmonary vascular congestion. 2. Mild cardiomegaly. 3. Atherosclerosis.   Electronically Signed   By: San Morelle M.D.   On: 03/31/2015 11:24   Dg Chest Port 1 View  03/30/2015   CLINICAL DATA:  81 year old male with cough and shortness breath. Subsequent encounter.  EXAM: PORTABLE CHEST - 1 VIEW  COMPARISON:  03/27/2015 and 09/02/2014.  FINDINGS: Pulmonary vascular congestion/ pulmonary edema superimposed upon chronic lung changes.  Heart size top-normal.  Calcified aorta.  No gross pneumothorax.  Sclerotic focus left upper lung zone not appreciated and may be associated with the rib as noted on remote chest CT.  IMPRESSION: Pulmonary vascular congestion/ pulmonary edema superimposed upon chronic lung changes.   Electronically Signed   By: Genia Del M.D.   On:  03/30/2015 15:04        Scheduled Meds: . amLODipine  10 mg Oral Daily  . antiseptic oral rinse  7 mL Mouth Rinse q12n4p  . aspirin EC  325 mg Oral BID  . chlorhexidine  15 mL Mouth Rinse BID  . Chlorhexidine Gluconate Cloth  6 each Topical Daily  . docusate sodium  100 mg Oral BID  . guaiFENesin  600 mg Oral BID  . lisinopril  20 mg Oral Daily  . mupirocin ointment  1 application Nasal BID  . pantoprazole  40 mg Oral Daily  . simvastatin  20 mg Oral q1800  . tobramycin  1 drop Both Eyes BID   Continuous Infusions:   Active Problems:   Esophageal dysmotility   Diastolic dysfunction   Essential hypertension   Subcapital fracture of hip   Hip fracture    Time spent: 25 minutes.    Vernell Leep, MD, FACP, FHM. [facility] Pager [phone]  If 7PM-7AM, please contact night-coverage [email or web address] Password [email or web address] 03/31/2015, 3:55 PM    LOS: 4 days

## 2015-04-01 ENCOUNTER — Inpatient Hospital Stay (HOSPITAL_COMMUNITY): Payer: Medicare Other

## 2015-04-01 DIAGNOSIS — I519 Heart disease, unspecified: Secondary | ICD-10-CM

## 2015-04-01 DIAGNOSIS — S72009S Fracture of unspecified part of neck of unspecified femur, sequela: Secondary | ICD-10-CM

## 2015-04-01 DIAGNOSIS — I10 Essential (primary) hypertension: Secondary | ICD-10-CM

## 2015-04-01 LAB — BASIC METABOLIC PANEL
Anion gap: 6 (ref 5–15)
BUN: 23 mg/dL — ABNORMAL HIGH (ref 6–20)
CO2: 31 mmol/L (ref 22–32)
CREATININE: 0.93 mg/dL (ref 0.61–1.24)
Calcium: 8 mg/dL — ABNORMAL LOW (ref 8.9–10.3)
Chloride: 88 mmol/L — ABNORMAL LOW (ref 101–111)
GFR calc Af Amer: >60 mL/min (ref >60)
GFR calc non Af Amer: >60 mL/min (ref >60)
Glucose, Bld: 102 mg/dL — ABNORMAL HIGH (ref 65–99)
POTASSIUM: 3.9 mmol/L (ref 3.5–5.1)
Sodium: 125 mmol/L — ABNORMAL LOW (ref 135–145)

## 2015-04-01 LAB — OSMOLALITY, URINE: Osmolality, Ur: 546 mOsm/kg (ref 390–1090)

## 2015-04-01 LAB — OSMOLALITY: Osmolality: 273 mOsm/kg — ABNORMAL LOW (ref 275–300)

## 2015-04-01 LAB — SODIUM, URINE, RANDOM: SODIUM UR: 64 mmol/L

## 2015-04-01 MED ORDER — FUROSEMIDE 10 MG/ML IJ SOLN
40.0000 mg | Freq: Once | INTRAMUSCULAR | Status: DC
  Filled 2015-04-01: qty 4

## 2015-04-01 MED ORDER — SENNOSIDES-DOCUSATE SODIUM 8.6-50 MG PO TABS
1.0000 | ORAL_TABLET | Freq: Two times a day (BID) | ORAL | Status: DC
  Administered 2015-04-02: 1 via ORAL
  Filled 2015-04-01 (×2): qty 1

## 2015-04-01 MED ORDER — MAGNESIUM CITRATE PO SOLN
1.0000 | Freq: Once | ORAL | Status: AC
  Administered 2015-04-01: 1 via ORAL

## 2015-04-01 MED ORDER — BISACODYL 5 MG PO TBEC
5.0000 mg | DELAYED_RELEASE_TABLET | Freq: Once | ORAL | Status: AC
  Administered 2015-04-01: 5 mg via ORAL
  Filled 2015-04-01: qty 1

## 2015-04-01 NOTE — Progress Notes (Signed)
Speech Language Pathology Treatment: Dysphagia  Patient Details Name: [name] MRN: [id number] DOB: Apr 05, 1925 Today's Date: 04/01/2015 Time: 8338-2505 SLP Time Calculation (min) (ACUTE ONLY): 28 min  Assessment / Plan / Recommendation Clinical Impression  Note plan for possible dc today however wife reports this has been again postponed.   Pt reports nausea during breakfast meal and thus poor intake.  SLP offered gingerale/saltine cracker to ease nausea which pt was agreeable to consume.  Wife reports pt has not had a bowel movement in several days.    Cough at baseline noted and productive cough to large amount of viscous white tinged secretions x1 after intake.  Occasional belching noted.  No overt indication of aspiration during consumption of gingerale/cracker.   Swallow ability appears improved today overall.  Suspect secretions impacting patient swallowing and encouraged use of incentive spirometer.   Pt reports improved swallowing s/p EGD with symptoms prior including sensing food not clearing throat well and some of it being regurgitated.  Reviewed aspiration precautions/compensation strategies to mitigate dysphagia.   Recommend to continue strict aspiration/esophageal precautions.  Follow up at short term SLP at SNF for assistance in dysphagia management recommended.    HPI Other Pertinent Information: 81 yo male adm 03/27/15 with hip fx after fall--> s/p L hip pinning on 03/28/15 per [name], MD; PMhx:pna, dysphagia, diastolic dysfunction, HTN. Pt previously evaluated by SLP at bedside with concerns for primary esophageal component. Per pt/wife, pt had his esophagus stretched one day PTA.   Pertinent Vitals Pain Assessment: No/denies pain  SLP Plan  Continue with current plan of care    Recommendations Diet recommendations: Dysphagia 3 (mechanical soft);Thin liquid Liquids provided via: Cup;Straw Medication Administration: Whole meds with puree (or as  tolerated) Supervision: Patient able to self feed Compensations: Slow rate;Small sips/bites;Chin tuck Postural Changes and/or Swallow Maneuvers: Seated upright 90 degrees              Oral Care Recommendations: Oral care BID Follow up Recommendations: Skilled Nursing facility Plan: Continue with current plan of care    [place], [place] [facility] SLP [phone]

## 2015-04-01 NOTE — Progress Notes (Signed)
     Subjective: 4 Days Post-Op Procedure(s) (LRB): CANNULATED HIP PINNING (Left)   Patient reports pain as mild, when he is weight bearing. Almost no pain while sitting in bed.   Objective:   VITALS:   Filed Vitals:   04/01/15 0545  BP: 148/73  Pulse: 82  Temp: 99.3 F (37.4 C)  Resp: 16    Dorsiflexion/Plantar flexion intact Incision: dressing C/D/I No cellulitis present Compartment soft  LABS  Recent Labs  03/30/15 0444 03/31/15 0750  HGB 11.6* 11.7*  HCT 35.7* 35.7*  WBC 11.0* 8.4  PLT 176 188     Recent Labs  03/30/15 0444 03/31/15 0750 04/01/15 0421  NA 130* 129* 125*  K 4.3 3.8 3.9  BUN 23* 26* 23*  CREATININE 0.99 1.08 0.93  GLUCOSE 104* 105* 102*     Assessment/Plan: 4 Days Post-Op Procedure(s) (LRB): CANNULATED HIP PINNING (Left) Up with therapy  Ortho recommendations:  ASA 325 mg bid for 4 weeks for anticoagulation, unless other medically indicated.  Norco for pain management (Rx written).  MiraLax and Colace for constipation  Iron 325 mg tid for 2-3 weeks   WB 50% on the left leg.  Dressing to remain in place until follow in clinic in 2 weeks.  Dressing is waterproof and may shower with it in place.  Follow up in 2 weeks at [facility]. Follow up with OLIN,Kapena Hamme D in 2 weeks.  Contact information:  [facility] [address], [address] [place] Kynlei Piontek   PAC  04/01/2015, 1:24 PM

## 2015-04-01 NOTE — Progress Notes (Signed)
Physical Therapy Treatment Patient Details Name: [name] MRN: [id number] DOB: Jun 10, 1925 Today's Date: 04/01/2015    History of Present Illness 81 yo male adm 03/27/15 with hip fx after fall--> s/p L hip pinning on 03/28/15 per [name], MD; PMhx:pna, dysphagia, diastolic dysfunction, HTN    PT Comments    Assisted pt OOB and amb a limited distance.  Went back to using B platform EVA walker to increase amb distance, increase support and ensure PWB status.  Second assist needed to follow with recliner.   SATURATION QUALIFICATIONS: (This note is used to comply with regulatory documentation for home oxygen)  Patient Saturations on Room Air at Rest = 94%  Patient Saturations on Room Air while Ambulating = 91%  Patient Saturations on         Liters of oxygen while Ambulating =    Pt did not need supplemental O2  Please briefly explain why patient needs home oxygen:  Follow Up Recommendations  SNF     Equipment Recommendations       Recommendations for Other Services       Precautions / Restrictions Precautions Precautions: Fall Precaution Comments: son states, pt tends to walk "hunched over" with his cane Restrictions Weight Bearing Restrictions: Yes LLE Weight Bearing: Partial weight bearing LLE Partial Weight Bearing Percentage or Pounds: 50%    Mobility  Bed Mobility Overal bed mobility: Needs Assistance Bed Mobility: Supine to Sit     Supine to sit: Mod assist;HOB elevated;Max assist     General bed mobility comments: utilized bed pad to scoot hips to transistion pt to EOB  Transfers Overall transfer level: Needs assistance Equipment used: Rolling walker (2 wheeled) Transfers: Sit to/from Stand Sit to Stand: +2 physical assistance;Mod assist Stand pivot transfers: +2 physical assistance;+2 safety/equipment;Max assist;Mod assist       General transfer comment: went back to using B plaform EVA walker.  25% VC's on proper tech and attempt to  increase upright posture.  Ambulation/Gait Ambulation/Gait assistance: +2 physical assistance;+2 safety/equipment;Max assist Ambulation Distance (Feet): 12 Feet Assistive device: Bilateral platform walker (EVA walker) Gait Pattern/deviations: Step-to pattern;Decreased step length - left;Decreased step length - right;Trunk flexed Gait velocity: decreased.   General Gait Details: went back to using B platform EVA walker.  Severe forward flex posture and limited amb distance.  Progressing slowly.   Stairs            Wheelchair Mobility    Modified Rankin (Stroke Patients Only)       Balance                                    Cognition Arousal/Alertness: Awake/alert Behavior During Therapy: WFL for tasks assessed/performed Overall Cognitive Status: Within Functional Limits for tasks assessed                      Exercises      General Comments        Pertinent Vitals/Pain Pain Assessment: No/denies pain Faces Pain Scale: Hurts a little bit Pain Location: L hip Pain Descriptors / Indicators: Aching;Sore Pain Intervention(s): Monitored during session;Repositioned    Home Living                      Prior Function            PT Goals (current goals can now be found in the care plan  section) Progress towards PT goals: Progressing toward goals    Frequency  Min 3X/week    PT Plan      Co-evaluation             End of Session Equipment Utilized During Treatment: Gait belt Activity Tolerance: Patient limited by fatigue Patient left: in chair;with call bell/phone within reach     Time: 1020-1045 PT Time Calculation (min) (ACUTE ONLY): 25 min  Charges:  $Gait Training: 8-22 mins $Therapeutic Activity: 8-22 mins                    G Codes:      Rica Koyanagi  PTA [facility]  Acute  Rehab Pager      [phone]

## 2015-04-01 NOTE — Progress Notes (Signed)
Clinical Social Work  Per MD, patient is not medically stable to DC today. CSW updated [employer] and [employer] of no DC plans today. [employer] reports authorization will have to be canceled and resubmitted once patient is more stable. CSW will continue to follow.  [place], [place] [phone]

## 2015-04-01 NOTE — Progress Notes (Signed)
PROGRESS NOTE    [name] YTK:[id number] DOB: [birth year] DOA: 03/27/2015 PCP: Chase Stain, MD  HPI/Brief narrative 81 year old male patient with history of HTN, HLD, former smoker, TIA, GERD complicated by esophagitis and peptic stricture status post dilatation by Dr. [name] on 03/25/15, admitted to [facility] on 03/27/15 following a mechanical fall at a drugstore leading to left hip fracture. Orthopedics was consulted and patient underwent surgical fixation on 03/28/15. Patient developed acute hypoxic respiratory failure secondary to acute diastolic CHF and when this has improved and stabilized, currently barrier to discharge is still hypoxic requiring oxygen, and hyponatremia.  Assessment/Plan:  Left nondisplaced femoral neck fracture - Sustained status post mechanical fall. Patient at baseline ambulates at home with the help of a cane. He does not go out much. On 7/29, he was assisting his wife in getting some medications from the pharmacy and fell. Patient cannot remember details but denies LOC or head injury. - Status post closed reduction percutaneous cannulated screw fixation by orthopedics on 7/30 - Activity/weightbearing status (partial weight bearing on left lower extremity), DVT prophylaxis (aspirin 325 MG twice daily for 4 weeks postop), pain management and wound care (keep current dressing until follow-up with orthopedics in 2 weeks) per orthopedic service - Discussed with Dr. [name] 8/1 and patient cleared for discharge to SNF.  Essential hypertension - Controlled on amlodipine and lisinopril.  History of A. fib with RVR - Clinically in sinus rhythm. Patient on aspirin PTA. Probably not an anticoagulation candidate secondary to gait ataxia and frequent falls. - After patient completes DVT prophylactic dose aspirin after 4 weeks as recommended by orthopedics, patient is to resume aspirin 81 mg daily.   Acute on chronic diastolic CHF - On 8/1, patient  complained of cough and was hypoxic in the low 80s. Chest x-ray suggested pulmonary edema. Started IV Lasix and diuresed over last 24 hours, continue with IV Lasix dose daily depending on clinical status  - Echo 08/02/14: LVEF 60-65 percent, grade 1 diastolic dysfunction. No ASP. Mild-to-moderate pulmonary hypertension. - EKG 7/29: Baseline tremor artifact but sinus rhythm and no acute changes appreciated.  TIA - Continue aspirin  GERD complicated by esophagitis and peptic stricture status post dilatation, duodenitis - Continue Protonix - H pylori screening test: Negative - Continue regular diet.  Frequent falls - As per patient and spouse, has fallen about 4 times this year.  Mild acute hypoxic respiratory failure  - Likely secondary to decompensated CHF. Titrate oxygen.  Mild acute blood loss anemia - Related to recent surgery. Stable. Follow CBC at SNF in a couple of days.   hyponatremia -  continue to worsen , sodium is 125 today , check urine sodium, osmolality, serum osmolality , will restrict fluids 1200 mL for possible SIADH , does not appear to be hypovolemic .   cough - patient was noticed to have significant cough with white productive sputum, will start flutter valve, chest PT, will check 2 view chest x-ray  DVT prophylaxis: As per orthopedics.  Code Status: DO NOT RESUSCITATE  Family Communication:  None at bedside  Disposition Plan: DC possibly to SNF when medically stable (currently his hyponatremia is holding his discharge)  Consultants:  Orthopedics   Procedures:  closed reduction percutaneous cannulated screw fixation by orthopedics on 7/30  Antibiotics:  None   Subjective:  still complaining of cough, productive white sputum  Filed Vitals:   03/31/15 1400 03/31/15 1710 03/31/15 2147 04/01/15 0545  BP: 158/58  129/59 148/73  Pulse: 82  86 82  Temp: 98.9 F (37.2 C)  99.5 F (37.5 C) 99.3 F (37.4 C)  TempSrc: Oral  Oral Oral  Resp: 18  16 16     Height:      Weight:      SpO2: 97% 91% 96% 95%    Intake/Output Summary (Last 24 hours) at 04/01/15 1244 Last data filed at 04/01/15 0811  Gross per 24 hour  Intake    960 ml  Output   1850 ml  Net   -890 ml   Filed Weights   03/27/15 1642  Weight: 63.05 kg (139 lb)     Exam:  General exam: Pleasant elderly male sitting up comfortably in bed. Respiratory system: Occasional bibasal crackles. Otherwise clear to auscultation. No increased work of breathing. Cardiovascular system: S1 & S2 heard, RRR. No JVD, murmurs, gallops, clicks or pedal edema. Gastrointestinal system: Abdomen is nondistended, soft and nontender. Normal bowel sounds heard. Central nervous system: Alert and oriented. No focal neurological deficits. Extremities: Symmetric 5 x 5 power except left lower extremity movements restricted by pain. Left hip surgical site dressing clean and dry.   Data Reviewed: Basic Metabolic Panel:  Recent Labs Lab 03/28/15 0528 03/29/15 0503 03/30/15 0444 03/31/15 0750 04/01/15 0421  NA 137 132* 130* 129* 125*  K 4.1 4.2 4.3 3.8 3.9  CL 98* 94* 95* 90* 88*  CO2 30 29 26 31 31   GLUCOSE 109* 123* 104* 105* 102*  BUN 19 20 23* 26* 23*  CREATININE 1.01 0.98 0.99 1.08 0.93  CALCIUM 9.1 8.5* 8.2* 8.3* 8.0*   Liver Function Tests:  Recent Labs Lab 03/28/15 0528  ALBUMIN 3.5   No results for input(s): LIPASE, AMYLASE in the last 168 hours. No results for input(s): AMMONIA in the last 168 hours. CBC:  Recent Labs Lab 03/27/15 1727 03/28/15 0528 03/29/15 0503 03/30/15 0444 03/31/15 0750  WBC 10.5 12.6* 11.3* 11.0* 8.4  NEUTROABS 7.9*  --   --   --   --   HGB 13.6 12.9* 12.3* 11.6* 11.7*  HCT 42.4 40.5 38.0* 35.7* 35.7*  MCV 84.1 84.4 85.0 84.0 82.3  PLT 249 236 189 176 188   Cardiac Enzymes: No results for input(s): CKTOTAL, CKMB, CKMBINDEX, TROPONINI in the last 168 hours. BNP (last 3 results)  Recent Labs  07/31/14 2154  PROBNP 657.5*   CBG: No  results for input(s): GLUCAP in the last 168 hours.  Recent Results (from the past 502 hour(s))  Helicobacter pylori screen-biopsy     Status: None   Collection Time: 03/25/15 10:15 AM  Result Value Ref Range Status   UREASE Negative Negative Final  Surgical pcr screen     Status: Abnormal   Collection Time: 03/27/15 10:43 PM  Result Value Ref Range Status   MRSA, PCR NEGATIVE NEGATIVE Final   Staphylococcus aureus POSITIVE (A) NEGATIVE Final    Comment:        The Xpert SA Assay (FDA approved for NASAL specimens in patients over 55 years of age), is one component of a comprehensive surveillance program.  Test performance has been validated by [facility] for patients greater than or equal to 44 year old. It is not intended to diagnose infection nor to guide or monitor treatment.         Studies: Dg Chest 2 View  03/31/2015   CLINICAL DATA:  Shortness breath and weakness.  Cough.  EXAM: CHEST - 2 VIEW  COMPARISON:  One-view chest x-ray 03/30/2015  FINDINGS: The heart is mildly enlarged. Interstitial coarsening is again noted, somewhat improved since the prior exam. Slight elevation of left hemidiaphragm is stable.  Degenerative changes are again seen in thoracic spine. Atherosclerotic calcifications are present in the aorta. No focal airspace consolidation or effusion is present.  IMPRESSION: 1. Improving aeration with some persistence of interstitial prominence suggesting mild edema or pulmonary vascular congestion. 2. Mild cardiomegaly. 3. Atherosclerosis.   Electronically Signed   By: San Morelle M.D.   On: 03/31/2015 11:24   Dg Chest Port 1 View  03/30/2015   CLINICAL DATA:  81 year old male with cough and shortness breath. Subsequent encounter.  EXAM: PORTABLE CHEST - 1 VIEW  COMPARISON:  03/27/2015 and 09/02/2014.  FINDINGS: Pulmonary vascular congestion/ pulmonary edema superimposed upon chronic lung changes.  Heart size top-normal.  Calcified aorta.  No gross  pneumothorax.  Sclerotic focus left upper lung zone not appreciated and may be associated with the rib as noted on remote chest CT.  IMPRESSION: Pulmonary vascular congestion/ pulmonary edema superimposed upon chronic lung changes.   Electronically Signed   By: Genia Del M.D.   On: 03/30/2015 15:04        Scheduled Meds: . amLODipine  10 mg Oral Daily  . antiseptic oral rinse  7 mL Mouth Rinse q12n4p  . aspirin EC  325 mg Oral BID  . chlorhexidine  15 mL Mouth Rinse BID  . Chlorhexidine Gluconate Cloth  6 each Topical Daily  . docusate sodium  100 mg Oral BID  . guaiFENesin  600 mg Oral BID  . lisinopril  20 mg Oral Daily  . pantoprazole  40 mg Oral Daily  . simvastatin  20 mg Oral q1800  . tobramycin  1 drop Both Eyes BID   Continuous Infusions:   Active Problems:   Esophageal dysmotility   Diastolic dysfunction   Essential hypertension   Subcapital fracture of hip   Hip fracture    Time spent: 30 minutes.    Phillips Climes, MD [facility] Pager [phone]  If 7PM-7AM, please contact night-coverage [email or web address] Password [email or web address] 04/01/2015, 12:44 PM    LOS: 5 days

## 2015-04-02 LAB — BASIC METABOLIC PANEL
ANION GAP: 8 (ref 5–15)
BUN: 24 mg/dL — ABNORMAL HIGH (ref 6–20)
CO2: 33 mmol/L — ABNORMAL HIGH (ref 22–32)
CREATININE: 0.92 mg/dL (ref 0.61–1.24)
Calcium: 8.2 mg/dL — ABNORMAL LOW (ref 8.9–10.3)
Chloride: 87 mmol/L — ABNORMAL LOW (ref 101–111)
GFR calc Af Amer: >60 mL/min (ref >60)
GFR calc non Af Amer: >60 mL/min (ref >60)
Glucose, Bld: 109 mg/dL — ABNORMAL HIGH (ref 65–99)
Potassium: 4.2 mmol/L (ref 3.5–5.1)
Sodium: 128 mmol/L — ABNORMAL LOW (ref 135–145)

## 2015-04-02 MED ORDER — FUROSEMIDE 10 MG/ML IJ SOLN
20.0000 mg | Freq: Once | INTRAMUSCULAR | Status: AC
  Administered 2015-04-02: 20 mg via INTRAVENOUS
  Filled 2015-04-02: qty 2

## 2015-04-02 MED ORDER — SENNOSIDES-DOCUSATE SODIUM 8.6-50 MG PO TABS: 1.0000 | ORAL_TABLET | Freq: Two times a day (BID) | ORAL | Status: DC

## 2015-04-02 MED ORDER — FUROSEMIDE 20 MG PO TABS: 20.0000 mg | ORAL_TABLET | ORAL | Status: DC

## 2015-04-02 NOTE — Clinical Social Work Placement (Signed)
   CLINICAL SOCIAL WORK PLACEMENT  NOTE  Date:  04/02/2015  Patient Details  Name: [name] MRN: [id number] Date of Birth: 09/05/1924  Clinical Social Work is seeking post-discharge placement for this patient at the [place] level of care (*CSW will initial, date and re-position this form in  chart as items are completed):  Yes   Patient/family provided with [place] Work Department's list of facilities offering this level of care within the geographic area requested by the patient (or if unable, by the patient's family).  Yes   Patient/family informed of their freedom to choose among providers that offer the needed level of care, that participate in Medicare, Medicaid or managed care program needed by the patient, have an available bed and are willing to accept the patient.  Yes   Patient/family informed of [facility]'s ownership interest in [facility] and [facility], as well as of the fact that they are under no obligation to receive care at these facilities.  PASRR submitted to EDS on       PASRR number received on       Existing PASRR number confirmed on 03/29/15     FL2 transmitted to all facilities in geographic area requested by pt/family on 03/30/15     FL2 transmitted to all facilities within larger geographic area on       Patient informed that his/her managed care company has contracts with or will negotiate with certain facilities, including the following:        Yes   Patient/family informed of bed offers received.  Patient chooses bed at [facility]     Physician recommends and patient chooses bed at      Patient to be transferred to [facility] on 04/02/15.  Patient to be transferred to facility by [facility]     Patient family notified on 04/02/15 of transfer.  Name of family member notified:  Family at bedside     PHYSICIAN       Additional Comment:     _______________________________________________ Boone Master, [place] 04/02/2015, 1:56 PM

## 2015-04-02 NOTE — Progress Notes (Signed)
Pt is asleep, resting comfortably.  Cpt not done at this time.

## 2015-04-02 NOTE — Plan of Care (Signed)
Problem: Phase III Progression Outcomes Goal: Activity at appropriate level-compared to baseline (UP IN CHAIR FOR HEMODIALYSIS)  Outcome: Not Met (add Reason) Pt still working with PT due to hip fracture sustained.

## 2015-04-02 NOTE — Discharge Summary (Signed)
[name], is a 81 y.o. male  DOB 04-Jun-1925  MRN [id number].  Admission date:  03/27/2015  Admitting Physician  Toy Baker, MD  Discharge Date:  04/02/2015   Primary MD  [name], MD  Recommendations for primary care physician for things to follow:    M.D. at SNF in 3 days with repeat labs (CBC & BMP).  Dr. [name], PCP upon discharge from SNF.  Dr. [name], orthopedics in 2 weeks for postoperative follow-up including wound check and x-rays.  Partial weight bearing left lower extremity. Maintain surgical dressing until follow-up with orthopedics in 2 weeks. Keep left thigh wounds dry.  Admission Diagnosis  Subcapital fracture of hip, left, closed, initial encounter [S72.012A]   Discharge Diagnosis  Subcapital fracture of hip, left, closed, initial encounter [S72.012A]    Active Problems:   Esophageal dysmotility   Diastolic dysfunction   Essential hypertension   Subcapital fracture of hip   Hip fracture      Past Medical History  Diagnosis Date  . Hypertension   . Hyperlipidemia   . Tobacco abuse   . Lung nodule     Left lung, seen 2012, no change in 2012, no follow up needed   . PNA (pneumonia) 2010    Bilateral Pneumonia, COPD 57mm LLL Nodule   . History of ETT 09/1991     POS   . History of MRI 04-22-06    L/S- right hnp  l5/si   . B12 deficiency   . TIA (transient ischemic attack)   . GERD (gastroesophageal reflux disease)   . Esophageal stricture   . Hemorrhoids   . Colon polyps     hyperplastic  . Pneumonia   . Macular degeneration     Past Surgical History  Procedure Laterality Date  . Cystectomy  1995    Lumbar area   . Hip pinning,cannulated Left 03/28/2015    Procedure: CANNULATED HIP PINNING;  Surgeon: [name], MD;  Location: [facility];  Service: Orthopedics;  Laterality: Left;       History of present illness and  Hospital  Course:     Kindly see H&P for history of present illness and admission details, please review complete Labs, Consult reports and Test reports for all details in brief  HPI  from the history and physical done on the day of admission    Hospital Course   Left nondisplaced femoral neck fracture - Sustained status post mechanical fall. Patient at baseline ambulates at home with the help of a cane. He does not go out much. On 7/29, he was assisting his wife in getting some medications from the pharmacy and fell. Patient cannot remember details but denies LOC or head injury. - Status post closed reduction percutaneous cannulated screw fixation by orthopedics on 7/30 - Activity/weightbearing status (partial weight bearing on left lower extremity), DVT prophylaxis (aspirin 325 MG twice daily for 4 weeks postop), pain management and wound care (keep current dressing until follow-up with orthopedics in 2 weeks) per orthopedic  service   Essential hypertension - Controlled on amlodipine and lisinopril.  History of A. fib with RVR - Clinically in sinus rhythm. Patient on aspirin PTA. Probably not an anticoagulation candidate secondary to gait ataxia and frequent falls. - After patient completes DVT prophylactic dose aspirin after 4 weeks as recommended by orthopedics, patient is to resume aspirin 81 mg daily.   Acute on chronic diastolic CHF - On 8/1, patient complained of cough and was hypoxic in the low 80s. Chest x-ray suggested pulmonary edema. Started IV Lasix and diuresed over last 24 hours, continue with IV Lasix dose daily depending on clinical status  - And to continue Lasix 20 mg every 48 hours for the next week then to stop, - Echo 08/02/14: LVEF 60-65 percent, grade 1 diastolic dysfunction. No ASP. Mild-to-moderate pulmonary hypertension. - EKG 7/29: Baseline tremor artifact but sinus rhythm and no acute changes appreciated.  hyponatremia -possible SIADH , as serum osmolality 273,  urine osmole 546, and urine sodium 64, improved with fluid restriction, patient to continue 1500 mL fluid restriction, sodium was 128 at day of discharge.  TIA - Continue aspirin  GERD complicated by esophagitis and peptic stricture status post dilatation, duodenitis - Continue Protonix - H pylori screening test: Negative - Continue regular diet.  Frequent falls - As per patient and spouse, has fallen about 4 times this year.  Mild acute hypoxic respiratory failure  - As per nursing, patient desaturated to 89% on room air with activity with physical therapy. Subsequently he was noted to have oxygen saturation in the low 80s without distress. He had a good cough with some production following which is oxygen saturations came up to the low 90s and patient is currently on 1 L/m oxygen via nasal cannula.  - Titrate off oxygen as tolerated. Incentive spirometry. - Mild hypoxemia is likely secondary to underlying COPD. Low index of suspicion for pneumonia or congestive heart failure. Will obtain chest x-ray prior to DC.  Mild acute blood loss anemia - Related to recent surgery. Stable. Follow CBC at SNF in a couple of days.      Consultants:  Orthopedics  Procedures:  Closed reduction percutaneous cannulated screw fixation by orthopedics on 7/30      Discharge Condition:  stable   Follow UP  Follow-up Information    Follow up with Mauri Pole, MD In 2 weeks.   Specialty:  Orthopedic Surgery   Why:  For wound check and X-rays   Contact information:   [address] [place] [address] [phone]       Follow up with M.D. at SNF. Schedule an appointment as soon as possible for a visit in 3 days.   Why:  To be seen with repeat labs (CBC & BMP).      Follow up with [name], MD.   Specialty:  Family Medicine   Why:  Upon discharge from SNF.   Contact information:   [place] [address] [phone]          Discharge Instructions  and  Discharge Medications    Discharge Instructions    Call MD for:  difficulty breathing, headache or visual disturbances    Complete by:  As directed      Call MD for:  extreme fatigue    Complete by:  As directed      Call MD for:  persistant dizziness or light-headedness    Complete by:  As directed  Call MD for:  persistant nausea and vomiting    Complete by:  As directed      Call MD for:  redness, tenderness, or signs of infection (pain, swelling, redness, odor or green/yellow discharge around incision site)    Complete by:  As directed      Call MD for:  severe uncontrolled pain    Complete by:  As directed      Call MD for:  temperature >100.4    Complete by:  As directed      Diet - low sodium heart healthy    Complete by:  As directed      Discharge instructions    Complete by:  As directed   Oxygen via nasal cannula at 1-2 L/m. Titrate down to discontinue as long as his saturations stay above 90%. Incentive spirometry every 2 hourly while awake.     Discharge instructions    Complete by:  As directed   Follow with Primary MD [name], MD in 7 days   Get CBC, CMP, 2 view Chest X ray checked  by Primary MD next visit.    Activity: As tolerated with Full fall precautions use walker/cane & assistance as needed   Disposition SNF   Diet: Heart Healthy , with 1500 mL fluid restriction, with feeding assistance and aspiration precautions.  For Heart failure patients - Check your Weight same time everyday, if you gain over 2 pounds, or you develop in leg swelling, experience more shortness of breath or chest pain, call your Primary MD immediately. Follow Cardiac Low Salt Diet and 1.5 lit/day fluid restriction.   On your next visit with your primary care physician please Get Medicines reviewed and adjusted.   Please request your Prim.MD to go over all Hospital Tests and Procedure/Radiological results at the follow up, please get all  Hospital records sent to your Prim MD by signing hospital release before you go home.   If you experience worsening of your admission symptoms, develop shortness of breath, life threatening emergency, suicidal or homicidal thoughts you must seek medical attention immediately by calling 911 or calling your MD immediately  if symptoms less severe.  You Must read complete instructions/literature along with all the possible adverse reactions/side effects for all the Medicines you take and that have been prescribed to you. Take any new Medicines after you have completely understood and accpet all the possible adverse reactions/side effects.   Do not drive, operating heavy machinery, perform activities at heights, swimming or participation in water activities or provide baby sitting services if your were admitted for syncope or siezures until you have seen by Primary MD or a Neurologist and advised to do so again.  Do not drive when taking Pain medications.    Do not take more than prescribed Pain, Sleep and Anxiety Medications  Special Instructions: If you have smoked or chewed Tobacco  in the last 2 yrs please stop smoking, stop any regular Alcohol  and or any Recreational drug use.  Wear Seat belts while driving.   Please note  You were cared for by a hospitalist during your hospital stay. If you have any questions about your discharge medications or the care you received while you were in the hospital after you are discharged, you can call the unit and asked to speak with the hospitalist on call if the hospitalist that took care of you is not available. Once you are discharged, your primary care physician will handle any further medical issues.  Please note that NO REFILLS for any discharge medications will be authorized once you are discharged, as it is imperative that you return to your primary care physician (or establish a relationship with a primary care physician if you do not have one) for  your aftercare needs so that they can reassess your need for medications and monitor your lab values.     Increase activity slowly    Complete by:  As directed      Increase activity slowly    Complete by:  As directed      Partial weight bearing    Complete by:  As directed   50%            Medication List    TAKE these medications        albuterol 108 (90 BASE) MCG/ACT inhaler  Commonly known as:  PROVENTIL HFA;VENTOLIN HFA  Inhale 2 puffs into the lungs every 6 (six) hours as needed for wheezing or shortness of breath.     amLODipine 10 MG tablet  Commonly known as:  NORVASC  Take 1 tablet (10 mg total) by mouth daily.     aspirin EC 325 MG tablet  Take 1 tablet (325 mg total) by mouth 2 (two) times daily. Take for 4 weeks     bisacodyl 10 MG suppository  Commonly known as:  DULCOLAX  Place 1 suppository (10 mg total) rectally daily as needed for moderate constipation.     docusate sodium 100 MG capsule  Commonly known as:  COLACE  Take 1 capsule (100 mg total) by mouth 2 (two) times daily.     furosemide 20 MG tablet  Commonly known as:  LASIX  Take 1 tablet (20 mg total) by mouth every other day. 4 total of 1 week then stop.     HYDROcodone-acetaminophen 5-325 MG per tablet  Commonly known as:  NORCO  Take 1-2 tablets by mouth every 6 (six) hours as needed.     lisinopril 20 MG tablet  Commonly known as:  PRINIVIL,ZESTRIL  Take 1 tablet (20 mg total) by mouth daily.     pantoprazole 40 MG tablet  Commonly known as:  PROTONIX  Take 1 tablet every morning 30 minutes prior to breakfast.     polyethylene glycol packet  Commonly known as:  MIRALAX / GLYCOLAX  Take 17 g by mouth daily as needed for mild constipation.     PRESERVISION AREDS 2 Caps  Take 1 tablet by mouth 2 (two) times daily. Take as directed.     senna-docusate 8.6-50 MG per tablet  Commonly known as:  Senokot-S  Take 1 tablet by mouth 2 (two) times daily.     simvastatin 20 MG tablet    Commonly known as:  ZOCOR  Take 1 tab by mouth at night     tobramycin 0.3 % ophthalmic solution  Commonly known as:  TOBREX  Place 1 drop into both eyes 2 (two) times daily.          Diet and Activity recommendation: See Discharge Instructions above         Dg Chest 1 View  03/27/2015   CLINICAL DATA:  Left hip fracture after fall today.  Preoperative.  EXAM: CHEST  1 VIEW  COMPARISON:  09/02/2014  FINDINGS: Mild emphysematous changes are present as well as chronic appearing basilar interstitial coarsening. There is a benign 5 x 7 mm left upper lobe nodule which is unchanged since at least 10/28/2010. No acute infiltrate  or congestive failure is evident.  IMPRESSION: No acute cardiopulmonary findings.   Electronically Signed   By: Andreas Newport M.D.   On: 03/27/2015 18:12   Dg Chest 2 View  04/01/2015   CLINICAL DATA:  Productive cough  EXAM: CHEST  2 VIEW  COMPARISON:  03/31/2015  FINDINGS: The heart size is at upper limits of normal without evidence for edema. Hyperinflation suggests emphysema. Both lungs are clear. The visualized skeletal structures are unremarkable.  IMPRESSION: No active cardiopulmonary disease.   Electronically Signed   By: Conchita Paris M.D.   On: 04/01/2015 14:33   Dg Chest 2 View  03/31/2015   CLINICAL DATA:  Shortness breath and weakness.  Cough.  EXAM: CHEST - 2 VIEW  COMPARISON:  One-view chest x-ray 03/30/2015  FINDINGS: The heart is mildly enlarged. Interstitial coarsening is again noted, somewhat improved since the prior exam. Slight elevation of left hemidiaphragm is stable.  Degenerative changes are again seen in thoracic spine. Atherosclerotic calcifications are present in the aorta. No focal airspace consolidation or effusion is present.  IMPRESSION: 1. Improving aeration with some persistence of interstitial prominence suggesting mild edema or pulmonary vascular congestion. 2. Mild cardiomegaly. 3. Atherosclerosis.   Electronically Signed   By:  San Morelle M.D.   On: 03/31/2015 11:24   Dg Chest Port 1 View  03/30/2015   CLINICAL DATA:  81 year old male with cough and shortness breath. Subsequent encounter.  EXAM: PORTABLE CHEST - 1 VIEW  COMPARISON:  03/27/2015 and 09/02/2014.  FINDINGS: Pulmonary vascular congestion/ pulmonary edema superimposed upon chronic lung changes.  Heart size top-normal.  Calcified aorta.  No gross pneumothorax.  Sclerotic focus left upper lung zone not appreciated and may be associated with the rib as noted on remote chest CT.  IMPRESSION: Pulmonary vascular congestion/ pulmonary edema superimposed upon chronic lung changes.   Electronically Signed   By: Genia Del M.D.   On: 03/30/2015 15:04   Dg Hip Operative Unilat With Pelvis Left  03/28/2015   CLINICAL DATA:  Internal fixation left hip fracture.  EXAM: OPERATIVE left HIP (WITH PELVIS IF PERFORMED) 3 VIEWS  Fluoroscopy time 0 minutes 7.4 seconds.  TECHNIQUE: Fluoroscopic spot image(s) were submitted for interpretation post-operatively.  COMPARISON:  03/27/2015  FINDINGS: Examination demonstrates placement of 4 orthopedic screws from inferior to the superior bridging patient's femoral neck fracture extending into the femoral head as hardware is intact. There is anatomic alignment about the fracture site. Recommend correlation with findings at the time of the procedure.  IMPRESSION: Internal fixation of left femoral neck fracture with hardware intact.   Electronically Signed   By: Marin Olp M.D.   On: 03/28/2015 15:10   Dg Hip Unilat With Pelvis 2-3 Views Left  03/27/2015   CLINICAL DATA:  Left hip pain after fall today  EXAM: DG HIP (WITH OR WITHOUT PELVIS) 2-3V LEFT  COMPARISON:  None.  FINDINGS: There is a subcapital left hip fracture with mild valgus angulation. There is no dislocation. There is no bone lesion or bony destruction to suggest a pathologic basis for the fracture.  IMPRESSION: Subcapital left hip fracture   Electronically Signed   By:  Andreas Newport M.D.   On: 03/27/2015 18:07    Micro Results    Recent Results (from the past 846 hour(s))  Helicobacter pylori screen-biopsy     Status: None   Collection Time: 03/25/15 10:15 AM  Result Value Ref Range Status   UREASE Negative Negative Final  Surgical pcr screen  Status: Abnormal   Collection Time: 03/27/15 10:43 PM  Result Value Ref Range Status   MRSA, PCR NEGATIVE NEGATIVE Final   Staphylococcus aureus POSITIVE (A) NEGATIVE Final    Comment:        The Xpert SA Assay (FDA approved for NASAL specimens in patients over 73 years of age), is one component of a comprehensive surveillance program.  Test performance has been validated by [facility] for patients greater than or equal to 45 year old. It is not intended to diagnose infection nor to guide or monitor treatment.        Today   Subjective:   [name] today has no headache,no chest abdominal pain,no new weakness tingling or numbness, feels much better  today.   Objective:   Blood pressure 159/64, pulse 80, temperature 98.9 F (37.2 C), temperature source Oral, resp. rate 18, height 5\' 5"  (1.651 m), weight 63.05 kg (139 lb), SpO2 98 %.   Intake/Output Summary (Last 24 hours) at 04/02/15 1304 Last data filed at 04/02/15 7425  Gross per 24 hour  Intake      0 ml  Output    800 ml  Net   -800 ml    Exam  General exam: Pleasant elderly male sitting up comfortably in bed. Respiratory system: Occasional bibasal crackles. Otherwise clear to auscultation. No increased work of breathing. Cardiovascular system: S1 & S2 heard, RRR. No JVD, murmurs, gallops, clicks or pedal edema. Gastrointestinal system: Abdomen is nondistended, soft and nontender. Normal bowel sounds heard. Central nervous system: Alert and oriented. No focal neurological deficits. Extremities: Symmetric 5 x 5 power except left lower extremity movements restricted by pain. Left hip surgical site dressing clean and  dry. Data Review   CBC w Diff: Lab Results  Component Value Date   WBC 8.4 03/31/2015   HGB 11.7* 03/31/2015   HCT 35.7* 03/31/2015   PLT 188 03/31/2015   LYMPHOPCT 18 03/27/2015   MONOPCT 6 03/27/2015   EOSPCT 1 03/27/2015   BASOPCT 0 03/27/2015    CMP: Lab Results  Component Value Date   NA 128* 04/02/2015   NA 132* 08/12/2014   K 4.2 04/02/2015   CL 87* 04/02/2015   CO2 33* 04/02/2015   BUN 24* 04/02/2015   BUN 27* 08/12/2014   CREATININE 0.92 04/02/2015   CREATININE 1.3 08/12/2014   GLU 93 08/12/2014   PROT 6.7 08/09/2014   ALBUMIN 3.5 03/28/2015   BILITOT 0.2* 08/09/2014   ALKPHOS 69 08/09/2014   AST 25 08/09/2014   ALT 29 08/09/2014  .   Total Time in preparing paper work, data evaluation and todays exam - 35 minutes  Cloyce Paterson M.D on 04/02/2015 at 1:04 PM  [facility]   Office  [phone]

## 2015-04-02 NOTE — Progress Notes (Signed)
Pt. Picked up by [facility] for transport to SNF. All discharge paperwork sent with driver.

## 2015-04-02 NOTE — Progress Notes (Signed)
Clinical Social Work  CSW faxed DC summary to [facility] who is agreeable to accept patient today. CSW received authorization from [facility] # [id number]. CSW prepared DC packet with FL2, DNR and hard scripts included. CSW informed patient and family of DC plans. RN to call report. CSW arranged [facility] for 2 pm pick up. [facility] #: [id number].  CSW is signing off but available if needed.  [place], [place] [phone]

## 2015-04-02 NOTE — Progress Notes (Signed)
Report called in to SNF to receiving nurse. Waiting [facility].

## 2015-04-06 ENCOUNTER — Non-Acute Institutional Stay (SKILLED_NURSING_FACILITY): Payer: Medicare Other | Admitting: Nurse Practitioner

## 2015-04-06 DIAGNOSIS — K224 Dyskinesia of esophagus: Secondary | ICD-10-CM | POA: Diagnosis not present

## 2015-04-06 DIAGNOSIS — I519 Heart disease, unspecified: Secondary | ICD-10-CM

## 2015-04-06 DIAGNOSIS — S72002S Fracture of unspecified part of neck of left femur, sequela: Secondary | ICD-10-CM

## 2015-04-06 DIAGNOSIS — I5189 Other ill-defined heart diseases: Secondary | ICD-10-CM

## 2015-04-06 DIAGNOSIS — R05 Cough: Secondary | ICD-10-CM | POA: Diagnosis not present

## 2015-04-06 DIAGNOSIS — I4891 Unspecified atrial fibrillation: Secondary | ICD-10-CM | POA: Diagnosis not present

## 2015-04-06 DIAGNOSIS — R059 Cough, unspecified: Secondary | ICD-10-CM

## 2015-04-06 DIAGNOSIS — I10 Essential (primary) hypertension: Secondary | ICD-10-CM

## 2015-04-06 NOTE — Progress Notes (Signed)
Patient ID: [name], male   DOB: Apr 29, 1925, 81 y.o.   MRN: [id number]    Nursing Home Location:  [place] of Service: SNF ([phone])  PCP: [name], MD  No Known Allergies  Chief Complaint  Patient presents with  . Hospitalization Follow-up    HPI:  Patient is a 81 y.o. male seen today at [facility] for hospital follow up, low grade temp over the weekend per nursing. Pt with a pmh of  Pt was hospitalized from 7/29-8/4 due to mech fall.  On 7/29, he was assisting his wife in getting some medications from the pharmacy and fell and suffered from left hip fracture. Pt now status post closed reduction percutaneous cannulated screw fixation by orthopedics on 7/30. Pt has acute CHF exacerbation in hospital needing treatment with IV lasix. Also noted to have hyponatremia requiring fluid restriction which improved sodium. Pt reports he was very out of it over the weekend but today it much clearer and improving. Low grade temp comes and goes.  No worsening shortness of breath, has cough and congestion but this has been the same since hospitalization No burning with urination.  No increase pain, heat or drainage to surgery site    Review of Systems:  Review of Systems  Constitutional: Negative for activity change, appetite change, fatigue and unexpected weight change.  HENT: Negative for congestion and hearing loss.   Eyes: Negative.   Respiratory: Positive for cough. Negative for shortness of breath.   Cardiovascular: Negative for chest pain, palpitations and leg swelling.  Gastrointestinal: Negative for abdominal pain, diarrhea and constipation.  Genitourinary: Negative for dysuria and difficulty urinating.  Musculoskeletal: Negative for myalgias and arthralgias.       Pain controlled on current regimen  Skin: Negative for color change and wound.  Neurological: Positive for weakness (generalized). Negative for dizziness.    Psychiatric/Behavioral: Positive for confusion (has improved). Negative for behavioral problems and agitation.    Past Medical History  Diagnosis Date  . Hypertension   . Hyperlipidemia   . Tobacco abuse   . Lung nodule     Left lung, seen 2012, no change in 2012, no follow up needed   . PNA (pneumonia) 2010    Bilateral Pneumonia, COPD 33mm LLL Nodule   . History of ETT 09/1991     POS   . History of MRI 04-22-06    L/S- right hnp  l5/si   . B12 deficiency   . TIA (transient ischemic attack)   . GERD (gastroesophageal reflux disease)   . Esophageal stricture   . Hemorrhoids   . Colon polyps     hyperplastic  . Pneumonia   . Macular degeneration    Past Surgical History  Procedure Laterality Date  . Cystectomy  1995    Lumbar area   . Hip pinning,cannulated Left 03/28/2015    Procedure: CANNULATED HIP PINNING;  Surgeon: [name], MD;  Location: [facility];  Service: Orthopedics;  Laterality: Left;   Social History:   reports that he quit smoking about 29 years ago. His smoking use included Cigarettes. He has quit using smokeless tobacco. His smokeless tobacco use included Chew. He reports that he does not drink alcohol or use illicit drugs.  Family History  Problem Relation Age of Onset  . Hypertension Mother   . Heart attack Father   . Colon cancer Neg Hx   . Esophageal cancer Neg Hx  Medications: Patient's Medications  New Prescriptions   No medications on file  Previous Medications   ALBUTEROL (PROVENTIL HFA;VENTOLIN HFA) 108 (90 BASE) MCG/ACT INHALER    Inhale 2 puffs into the lungs every 6 (six) hours as needed for wheezing or shortness of breath.   AMLODIPINE (NORVASC) 10 MG TABLET    Take 1 tablet (10 mg total) by mouth daily.   ASPIRIN EC 325 MG TABLET    Take 1 tablet (325 mg total) by mouth 2 (two) times daily. Take for 4 weeks   BISACODYL (DULCOLAX) 10 MG SUPPOSITORY    Place 1 suppository (10 mg total) rectally daily as needed for moderate  constipation.   DOCUSATE SODIUM (COLACE) 100 MG CAPSULE    Take 1 capsule (100 mg total) by mouth 2 (two) times daily.   FUROSEMIDE (LASIX) 20 MG TABLET    Take 1 tablet (20 mg total) by mouth every other day. 4 total of 1 week then stop.   HYDROCODONE-ACETAMINOPHEN (NORCO) 5-325 MG PER TABLET    Take 1-2 tablets by mouth every 6 (six) hours as needed.   LISINOPRIL (PRINIVIL,ZESTRIL) 20 MG TABLET    Take 1 tablet (20 mg total) by mouth daily.   MULTIPLE VITAMINS-MINERALS (PRESERVISION AREDS 2) CAPS    Take 1 tablet by mouth 2 (two) times daily. Take as directed.   PANTOPRAZOLE (PROTONIX) 40 MG TABLET    Take 1 tablet every morning 30 minutes prior to breakfast.   POLYETHYLENE GLYCOL (MIRALAX / GLYCOLAX) PACKET    Take 17 g by mouth daily as needed for mild constipation.   SENNA-DOCUSATE (SENOKOT-S) 8.6-50 MG PER TABLET    Take 1 tablet by mouth 2 (two) times daily.   SIMVASTATIN (ZOCOR) 20 MG TABLET    Take 1 tab by mouth at night   TOBRAMYCIN (TOBREX) 0.3 % OPHTHALMIC SOLUTION    Place 1 drop into both eyes 2 (two) times daily.   Modified Medications   No medications on file  Discontinued Medications   No medications on file     Physical Exam: Filed Vitals:   04/06/15 1259  BP: 131/73  Pulse: 80  Temp: 99.5 F (37.5 C)  Resp: 20    Physical Exam  Constitutional: He is oriented to person, place, and time. No distress.  Frail elderly male, nad  HENT:  Head: Normocephalic and atraumatic.  Mouth/Throat: Oropharynx is clear and moist. No oropharyngeal exudate.  Eyes: Conjunctivae and EOM are normal. Pupils are equal, round, and reactive to light.  Neck: Normal range of motion. Neck supple.  Cardiovascular: Normal rate, regular rhythm and normal heart sounds.   Pulmonary/Chest: Effort normal. He has decreased breath sounds. He has rhonchi (throughout).  Abdominal: Soft. Bowel sounds are normal.  Musculoskeletal: He exhibits no edema or tenderness.  Neurological: He is alert and  oriented to person, place, and time.  Skin: Skin is warm and dry. He is not diaphoretic.  Left hip with hydrocolloid dressing intact, no erythema to surrounding area  Psychiatric: He has a normal mood and affect.    Labs reviewed: Basic Metabolic Panel:  Recent Labs  08/08/14 0552  03/31/15 0750 04/01/15 0421 04/02/15 0445  NA 129*  < > 129* 125* 128*  K 4.0  < > 3.8 3.9 4.2  CL 87*  < > 90* 88* 87*  CO2 29  < > 31 31 33*  GLUCOSE 106*  < > 105* 102* 109*  BUN 16  < > 26* 23* 24*  CREATININE  1.01  < > 1.08 0.93 0.92  CALCIUM 9.2  < > 8.3* 8.0* 8.2*  MG 1.7  --   --   --   --   < > = values in this interval not displayed. Liver Function Tests:  Recent Labs  08/01/14 0700 08/08/14 0552 08/09/14 0520 03/28/15 0528  AST 22 25 25   --   ALT 16 32 29  --   ALKPHOS 76 70 69  --   BILITOT 0.5 0.3 0.2*  --   PROT 6.3 7.1 6.7  --   ALBUMIN 2.8* 2.7* 2.7* 3.5   No results for input(s): LIPASE, AMYLASE in the last 8760 hours. No results for input(s): AMMONIA in the last 8760 hours. CBC:  Recent Labs  08/01/14 0700  09/02/14 1324 03/27/15 1727  03/29/15 0503 03/30/15 0444 03/31/15 0750  WBC 20.5*  < > 13.3* 10.5  < > 11.3* 11.0* 8.4  NEUTROABS 18.0*  --  10.1* 7.9*  --   --   --   --   HGB 11.5*  < > 12.8* 13.6  < > 12.3* 11.6* 11.7*  HCT 35.9*  < > 40.6 42.4  < > 38.0* 35.7* 35.7*  MCV 84.7  < > 84.3 84.1  < > 85.0 84.0 82.3  PLT PLATELET CLUMPS NOTED ON SMEAR, COUNT APPEARS ADEQUATE  < > 383.0 249  < > 189 176 188  < > = values in this interval not displayed. TSH:  Recent Labs  08/01/14 1535  TSH 1.690   A1C: Lab Results  Component Value Date   HGBA1C 6.2* 03/16/2011   Lipid Panel: No results for input(s): CHOL, HDL, LDLCALC, TRIG, CHOLHDL, LDLDIRECT in the last 8760 hours.  Radiological Exams: Dg Chest 1 View  03/27/2015   CLINICAL DATA:  Left hip fracture after fall today.  Preoperative.  EXAM: CHEST  1 VIEW  COMPARISON:  09/02/2014  FINDINGS: Mild  emphysematous changes are present as well as chronic appearing basilar interstitial coarsening. There is a benign 5 x 7 mm left upper lobe nodule which is unchanged since at least 10/28/2010. No acute infiltrate or congestive failure is evident.  IMPRESSION: No acute cardiopulmonary findings.   Electronically Signed   By: Andreas Newport M.D.   On: 03/27/2015 18:12   Dg Hip Operative Unilat With Pelvis Left  03/28/2015   CLINICAL DATA:  Internal fixation left hip fracture.  EXAM: OPERATIVE left HIP (WITH PELVIS IF PERFORMED) 3 VIEWS  Fluoroscopy time 0 minutes 7.4 seconds.  TECHNIQUE: Fluoroscopic spot image(s) were submitted for interpretation post-operatively.  COMPARISON:  03/27/2015  FINDINGS: Examination demonstrates placement of 4 orthopedic screws from inferior to the superior bridging patient's femoral neck fracture extending into the femoral head as hardware is intact. There is anatomic alignment about the fracture site. Recommend correlation with findings at the time of the procedure.  IMPRESSION: Internal fixation of left femoral neck fracture with hardware intact.   Electronically Signed   By: Marin Olp M.D.   On: 03/28/2015 15:10   Dg Hip Unilat With Pelvis 2-3 Views Left  03/27/2015   CLINICAL DATA:  Left hip pain after fall today  EXAM: DG HIP (WITH OR WITHOUT PELVIS) 2-3V LEFT  COMPARISON:  None.  FINDINGS: There is a subcapital left hip fracture with mild valgus angulation. There is no dislocation. There is no bone lesion or bony destruction to suggest a pathologic basis for the fracture.  IMPRESSION: Subcapital left hip fracture   Electronically Signed  By: Andreas Newport M.D.   On: 03/27/2015 18:07    Assessment/Plan  1. Hip fracture, left, sequela S/p repair,  Per ortho Activity/weightbearing status (partial weight bearing on left lower extremity) -cont ASA 325 mg BID for 4 weeks for DVT prophylaxis  - pain controlled on current regimen  -pt to keep current dressing  until follow-up with orthopedics in 2 weeks  2. Essential hypertension Controlled on amlodipine and lisinopril   3. Atrial fibrillation with RVR -rate controlled, not currently on anticoagulation due to fall risk.   4. Esophageal dysmotility Stable, recently with esophageal dilatation per GI conts protonix  5. Diastolic dysfunction With recent exacerbation in hospital, was started on IV lasix with improvement. Was transitioned to PO lasix.  Echo 08/02/14: LVEF 60-65 percent, grade 1 diastolic dysfunction. No ASP. Mild-to-moderate pulmonary hypertension.  Due to cough and congestion will follow up chest xray  6. Cough With recent resp failure in hospital. O2 stable at this time Encouraged pulmonary hygiene with cough, deep breathing and IS -will follow up CBC with diff due to low grade temp with chest xray    7. Hyponatremia -possible SIADH during hospitalization,  -improved with fluid restriction, patient to continue 1500 mL fluid restriction -will follow up BMP   Jessica K. Harle Battiest  [facility] [phone] 8 am - 5 pm) [phone] (after hours)

## 2015-04-07 ENCOUNTER — Non-Acute Institutional Stay (SKILLED_NURSING_FACILITY): Payer: Medicare Other | Admitting: Internal Medicine

## 2015-04-07 DIAGNOSIS — I519 Heart disease, unspecified: Secondary | ICD-10-CM | POA: Diagnosis not present

## 2015-04-07 DIAGNOSIS — K5900 Constipation, unspecified: Secondary | ICD-10-CM

## 2015-04-07 DIAGNOSIS — J438 Other emphysema: Secondary | ICD-10-CM

## 2015-04-07 DIAGNOSIS — I10 Essential (primary) hypertension: Secondary | ICD-10-CM | POA: Diagnosis not present

## 2015-04-07 DIAGNOSIS — R5381 Other malaise: Secondary | ICD-10-CM

## 2015-04-07 DIAGNOSIS — N289 Disorder of kidney and ureter, unspecified: Secondary | ICD-10-CM

## 2015-04-07 DIAGNOSIS — I4891 Unspecified atrial fibrillation: Secondary | ICD-10-CM

## 2015-04-07 DIAGNOSIS — E871 Hypo-osmolality and hyponatremia: Secondary | ICD-10-CM | POA: Diagnosis not present

## 2015-04-07 DIAGNOSIS — S72012S Unspecified intracapsular fracture of left femur, sequela: Secondary | ICD-10-CM | POA: Diagnosis not present

## 2015-04-07 DIAGNOSIS — K2100 Gastro-esophageal reflux disease with esophagitis, without bleeding: Secondary | ICD-10-CM

## 2015-04-07 DIAGNOSIS — D62 Acute posthemorrhagic anemia: Secondary | ICD-10-CM

## 2015-04-07 DIAGNOSIS — I5189 Other ill-defined heart diseases: Secondary | ICD-10-CM

## 2015-04-07 NOTE — Progress Notes (Signed)
Patient ID: [name], male   DOB: [birth year], 81 y.o.   MRN: [id number]      Facility: [facility]    PCP: [name], MD  Code Status: full code  No Known Allergies  Chief Complaint  Patient presents with  . New Admit To SNF     HPI:  81 y.o. patient is here for short term rehabilitation post hospital admission from 03/27/15-04/02/15 with subcapital fracture of left hip post mechanical fall. He underwent closed reduction with percutaneous cannulated screw fixation by orthopedics on 03/28/15. He also had acute on chronic chf exacerbation and required diuresis. He is seen in his room today. He is partial weight bearing to his left lower extremity.he is on o2 and denies dyspnea. He is alert and oriented to person and somewhat to time. Had bowel movement this am  Review of Systems:  Constitutional: positive for easy fatigue. Negative for fever, chills, diaphoresis.  HENT: Negative for headache, congestion, nasal discharge Eyes: Negative for eye pain, blurred vision, double vision and discharge.  Respiratory: Negative for cough, shortness of breath and wheezing.   Cardiovascular: Negative for chest pain, palpitations, leg swelling.  Gastrointestinal: Negative for heartburn, nausea, vomiting, abdominal pain Genitourinary: Negative for dysuria Musculoskeletal: Negative for back pain and falls in facility Skin: Negative for itching, rash.  Neurological: Negative for dizziness, tingling, focal weakness Psychiatric/Behavioral: Negative for depression   Past Medical History  Diagnosis Date  . Hypertension   . Hyperlipidemia   . Tobacco abuse   . Lung nodule     Left lung, seen 2012, no change in 2012, no follow up needed   . PNA (pneumonia) 2010    Bilateral Pneumonia, COPD 19mm LLL Nodule   . History of ETT 09/1991     POS   . History of MRI 04-22-06    L/S- right hnp  l5/si   . B12 deficiency   . TIA (transient ischemic attack)   . GERD  (gastroesophageal reflux disease)   . Esophageal stricture   . Hemorrhoids   . Colon polyps     hyperplastic  . Pneumonia   . Macular degeneration    Past Surgical History  Procedure Laterality Date  . Cystectomy  1995    Lumbar area   . Hip pinning,cannulated Left 03/28/2015    Procedure: CANNULATED HIP PINNING;  Surgeon: [name], MD;  Location: [facility];  Service: Orthopedics;  Laterality: Left;   Social History:   reports that he quit smoking about 29 years ago. His smoking use included Cigarettes. He has quit using smokeless tobacco. His smokeless tobacco use included Chew. He reports that he does not drink alcohol or use illicit drugs.  Family History  Problem Relation Age of Onset  . Hypertension Mother   . Heart attack Father   . Colon cancer Neg Hx   . Esophageal cancer Neg Hx     Medications:   Medication List       This list is accurate as of: 04/07/15  2:25 PM.  Always use your most recent med list.               albuterol 108 (90 BASE) MCG/ACT inhaler  Commonly known as:  PROVENTIL HFA;VENTOLIN HFA  Inhale 2 puffs into the lungs every 6 (six) hours as needed for wheezing or shortness of breath.     amLODipine 10 MG tablet  Commonly known as:  NORVASC  Take 1 tablet (10 mg total) by mouth  daily.     aspirin EC 325 MG tablet  Take 1 tablet (325 mg total) by mouth 2 (two) times daily. Take for 4 weeks     bisacodyl 10 MG suppository  Commonly known as:  DULCOLAX  Place 1 suppository (10 mg total) rectally daily as needed for moderate constipation.     docusate sodium 100 MG capsule  Commonly known as:  COLACE  Take 1 capsule (100 mg total) by mouth 2 (two) times daily.     furosemide 20 MG tablet  Commonly known as:  LASIX  Take 1 tablet (20 mg total) by mouth every other day. 4 total of 1 week then stop.     HYDROcodone-acetaminophen 5-325 MG per tablet  Commonly known as:  NORCO  Take 1-2 tablets by mouth every 6 (six) hours as needed.      lisinopril 20 MG tablet  Commonly known as:  PRINIVIL,ZESTRIL  Take 1 tablet (20 mg total) by mouth daily.     pantoprazole 40 MG tablet  Commonly known as:  PROTONIX  Take 1 tablet every morning 30 minutes prior to breakfast.     polyethylene glycol packet  Commonly known as:  MIRALAX / GLYCOLAX  Take 17 g by mouth daily as needed for mild constipation.     PRESERVISION AREDS 2 Caps  Take 1 tablet by mouth 2 (two) times daily. Take as directed.     senna-docusate 8.6-50 MG per tablet  Commonly known as:  Senokot-S  Take 1 tablet by mouth 2 (two) times daily.     simvastatin 20 MG tablet  Commonly known as:  ZOCOR  Take 1 tab by mouth at night     tobramycin 0.3 % ophthalmic solution  Commonly known as:  TOBREX  Place 1 drop into both eyes 2 (two) times daily.         Physical Exam: Filed Vitals:   04/07/15 1423  BP: 136/76  Pulse: 72  Temp: 99.1 F (37.3 C)  Resp: 18  SpO2: 93%    General- elderly male, frail, in no acute distress Head- normocephalic, atraumatic Throat- moist mucus membrane  Eyes- PERRLA, EOMI, no pallor, no icterus, no discharge, normal conjunctiva, normal sclera Neck- no cervical lymphadenopathy Cardiovascular- normal s1,s2, no murmurs, palpable dorsalis pedis and radial pulses, left leg edema Respiratory- bilateral poor air entry with scattered rhonchi present, no crackles, no use of accessory muscles Abdomen- bowel sounds present, soft, non tender Musculoskeletal- able to move all 4 extremities, generalized weakness, left leg ROM limited Neurological- no focal deficit, alert and oriented to person Skin- warm and dry, left hip aquacel dressing Psychiatry- normal mood and affect    Labs reviewed: Basic Metabolic Panel:  Recent Labs  08/08/14 0552  03/31/15 0750 04/01/15 0421 04/02/15 0445  NA 129*  < > 129* 125* 128*  K 4.0  < > 3.8 3.9 4.2  CL 87*  < > 90* 88* 87*  CO2 29  < > 31 31 33*  GLUCOSE 106*  < > 105* 102* 109*  BUN  16  < > 26* 23* 24*  CREATININE 1.01  < > 1.08 0.93 0.92  CALCIUM 9.2  < > 8.3* 8.0* 8.2*  MG 1.7  --   --   --   --   < > = values in this interval not displayed. Liver Function Tests:  Recent Labs  08/01/14 0700 08/08/14 0552 08/09/14 0520 03/28/15 0528  AST 22 25 25   --   ALT  16 32 29  --   ALKPHOS 76 70 69  --   BILITOT 0.5 0.3 0.2*  --   PROT 6.3 7.1 6.7  --   ALBUMIN 2.8* 2.7* 2.7* 3.5   No results for input(s): LIPASE, AMYLASE in the last 8760 hours. No results for input(s): AMMONIA in the last 8760 hours. CBC:  Recent Labs  08/01/14 0700  09/02/14 1324 03/27/15 1727  03/29/15 0503 03/30/15 0444 03/31/15 0750  WBC 20.5*  < > 13.3* 10.5  < > 11.3* 11.0* 8.4  NEUTROABS 18.0*  --  10.1* 7.9*  --   --   --   --   HGB 11.5*  < > 12.8* 13.6  < > 12.3* 11.6* 11.7*  HCT 35.9*  < > 40.6 42.4  < > 38.0* 35.7* 35.7*  MCV 84.7  < > 84.3 84.1  < > 85.0 84.0 82.3  PLT PLATELET CLUMPS NOTED ON SMEAR, COUNT APPEARS ADEQUATE  < > 383.0 249  < > 189 176 188  < > = values in this interval not displayed. Cardiac Enzymes:  Recent Labs  07/31/14 2154 08/08/14 0552  CKTOTAL  --  19  TROPONINI <0.30  --    BNP: Invalid input(s): POCBNP CBG: No results for input(s): GLUCAP in the last 8760 hours.  Radiological Exams: Dg Chest 1 View  03/27/2015   CLINICAL DATA:  Left hip fracture after fall today.  Preoperative.  EXAM: CHEST  1 VIEW  COMPARISON:  09/02/2014  FINDINGS: Mild emphysematous changes are present as well as chronic appearing basilar interstitial coarsening. There is a benign 5 x 7 mm left upper lobe nodule which is unchanged since at least 10/28/2010. No acute infiltrate or congestive failure is evident.  IMPRESSION: No acute cardiopulmonary findings.   Electronically Signed   By: Andreas Newport M.D.   On: 03/27/2015 18:12   Dg Hip Operative Unilat With Pelvis Left  03/28/2015   CLINICAL DATA:  Internal fixation left hip fracture.  EXAM: OPERATIVE left HIP (WITH  PELVIS IF PERFORMED) 3 VIEWS  Fluoroscopy time 0 minutes 7.4 seconds.  TECHNIQUE: Fluoroscopic spot image(s) were submitted for interpretation post-operatively.  COMPARISON:  03/27/2015  FINDINGS: Examination demonstrates placement of 4 orthopedic screws from inferior to the superior bridging patient's femoral neck fracture extending into the femoral head as hardware is intact. There is anatomic alignment about the fracture site. Recommend correlation with findings at the time of the procedure.  IMPRESSION: Internal fixation of left femoral neck fracture with hardware intact.   Electronically Signed   By: Marin Olp M.D.   On: 03/28/2015 15:10   Dg Hip Unilat With Pelvis 2-3 Views Left  03/27/2015   CLINICAL DATA:  Left hip pain after fall today  EXAM: DG HIP (WITH OR WITHOUT PELVIS) 2-3V LEFT  COMPARISON:  None.  FINDINGS: There is a subcapital left hip fracture with mild valgus angulation. There is no dislocation. There is no bone lesion or bony destruction to suggest a pathologic basis for the fracture.  IMPRESSION: Subcapital left hip fracture   Electronically Signed   By: Andreas Newport M.D.   On: 03/27/2015 18:07   04/06/15 wbc 9, hb 11.6, hct 33.9, plt 362, na 126, k 4.5, bun 27, cr 1.07, ca 8.6 04/06/15 cxr- no vascular congestion, effusion or pneumonia reported  Assessment/Plan  Physical deconditioning Will have him work with physical therapy and occupational therapy team to help with gait training and muscle strengthening exercises.fall precautions. Skin care. Encourage to  be out of bed.   Left hip fracture S/p closed reduction and screw fixation. Has follow up with orthopedics. Continue aspirin 325 mg bid for dvt prophylaxis for 4 weeks. PWB to LLE. Continue norco 5-325 mg 1-2 mg q6h prn pain and bowelregimen. To work with therapy team  Emphysema Noted on prior chest xray. With his rhonchi, concern for bronchitis changes. Add duoneb qid while awake x 5 days, then q6h prn and get cxr  PA/lateral view  Blood loss anemia Post op, monitor h&h. Cbc in 1week  Impaired renal function From lasix, check bmp in 1 week  Hyponatremia From SIADH, needs fluid restriction  Constipation Stable, continue senokot s and miralax with prn suppository and monitor  HTN Stable bp reading, continue amlodipine 10 mg daily, lisinopril 20 mg daily and monitor bp  AFib with RVR Rate controlled. Continue aspirin  Chronic diastolic chf Stable, continue lasix for now with o2 and monitor clinically. Continue ACEI. Monitor bmp. Wean off o2 as tolerated  GERD Continue protonix home regimen and monitor    Goals of care: short term rehabilitation   Labs/tests ordered: cbc, bmp  Family/ staff Communication: reviewed care plan with patient and nursing supervisor    Blanchie Serve, MD  [place] [phone] (Monday-Friday 8 am - 5 pm) [phone] (afterhours)

## 2015-04-15 ENCOUNTER — Non-Acute Institutional Stay (SKILLED_NURSING_FACILITY): Payer: Medicare Other | Admitting: Nurse Practitioner

## 2015-04-15 DIAGNOSIS — J189 Pneumonia, unspecified organism: Secondary | ICD-10-CM | POA: Diagnosis not present

## 2015-04-15 DIAGNOSIS — S72002S Fracture of unspecified part of neck of left femur, sequela: Secondary | ICD-10-CM | POA: Diagnosis not present

## 2015-04-15 DIAGNOSIS — I519 Heart disease, unspecified: Secondary | ICD-10-CM | POA: Diagnosis not present

## 2015-04-15 DIAGNOSIS — I10 Essential (primary) hypertension: Secondary | ICD-10-CM | POA: Diagnosis not present

## 2015-04-15 DIAGNOSIS — K224 Dyskinesia of esophagus: Secondary | ICD-10-CM | POA: Diagnosis not present

## 2015-04-15 DIAGNOSIS — R5381 Other malaise: Secondary | ICD-10-CM

## 2015-04-15 DIAGNOSIS — I5189 Other ill-defined heart diseases: Secondary | ICD-10-CM

## 2015-04-15 NOTE — Progress Notes (Signed)
Patient ID: [name], male   DOB: 1925/08/12, 81 y.o.   MRN: [id number]    Nursing Home Location:  [place] of Service: SNF ([phone])  PCP: [name], MD  No Known Allergies  Chief Complaint  Patient presents with  . Discharge Note    HPI:  Patient is a 81 y.o. male seen today at [facility] for discharge home.  Pt was hospitalized from 7/29-8/4 due to mech fall.  On 7/29, he was assisting his wife in getting some medications from the pharmacy and fell and suffered from left hip fracture. Pt now status post closed reduction percutaneous cannulated screw fixation by orthopedics on 7/30. Pt has acute CHF exacerbation in hospital needing treatment with IV lasix. Also noted to have hyponatremia requiring fluid restriction which improved sodium. Since pt has been at [facility] place he has developed pneumonia and currently being treated with avelox, has 2 more days left to complete 7 day course. Pt feeling much better. Denies shortness of breath, worsening cough or congestion. Does require O2 to maintain sats. Patient also doing well with therapy, now stable to discharge home with wife and home health therapies.   Review of Systems:  Review of Systems  Constitutional: Negative for activity change, appetite change, fatigue and unexpected weight change.  HENT: Negative for congestion and hearing loss.   Eyes: Negative.   Respiratory: Positive for cough. Negative for shortness of breath.   Cardiovascular: Negative for chest pain, palpitations and leg swelling.  Gastrointestinal: Negative for abdominal pain, diarrhea and constipation.  Genitourinary: Negative for dysuria and difficulty urinating.  Musculoskeletal: Negative for myalgias and arthralgias.       Pain controlled on current regimen  Skin: Negative for color change and wound.  Neurological: Positive for weakness (generalized). Negative for dizziness.  Psychiatric/Behavioral:  Negative for behavioral problems, confusion and agitation.    Past Medical History  Diagnosis Date  . Hypertension   . Hyperlipidemia   . Tobacco abuse   . Lung nodule     Left lung, seen 2012, no change in 2012, no follow up needed   . PNA (pneumonia) 2010    Bilateral Pneumonia, COPD 31mm LLL Nodule   . History of ETT 09/1991     POS   . History of MRI 04-22-06    L/S- right hnp  l5/si   . B12 deficiency   . TIA (transient ischemic attack)   . GERD (gastroesophageal reflux disease)   . Esophageal stricture   . Hemorrhoids   . Colon polyps     hyperplastic  . Pneumonia   . Macular degeneration    Past Surgical History  Procedure Laterality Date  . Cystectomy  1995    Lumbar area   . Hip pinning,cannulated Left 03/28/2015    Procedure: CANNULATED HIP PINNING;  Surgeon: [name], MD;  Location: [facility];  Service: Orthopedics;  Laterality: Left;   Social History:   reports that he quit smoking about 29 years ago. His smoking use included Cigarettes. He has quit using smokeless tobacco. His smokeless tobacco use included Chew. He reports that he does not drink alcohol or use illicit drugs.  Family History  Problem Relation Age of Onset  . Hypertension Mother   . Heart attack Father   . Colon cancer Neg Hx   . Esophageal cancer Neg Hx     Medications: Patient's Medications  New Prescriptions   No medications on file  Previous Medications   ALBUTEROL (PROVENTIL HFA;VENTOLIN HFA) 108 (90 BASE) MCG/ACT INHALER    Inhale 2 puffs into the lungs every 6 (six) hours as needed for wheezing or shortness of breath.   AMLODIPINE (NORVASC) 10 MG TABLET    Take 1 tablet (10 mg total) by mouth daily.   ASPIRIN EC 325 MG TABLET    Take 1 tablet (325 mg total) by mouth 2 (two) times daily. Take for 4 weeks   BISACODYL (DULCOLAX) 10 MG SUPPOSITORY    Place 1 suppository (10 mg total) rectally daily as needed for moderate constipation.   DOCUSATE SODIUM (COLACE) 100 MG CAPSULE    Take 1  capsule (100 mg total) by mouth 2 (two) times daily.   FUROSEMIDE (LASIX) 20 MG TABLET    Take 1 tablet (20 mg total) by mouth every other day. 4 total of 1 week then stop.   HYDROCODONE-ACETAMINOPHEN (NORCO) 5-325 MG PER TABLET    Take 1-2 tablets by mouth every 6 (six) hours as needed.   IPRATROPIUM-ALBUTEROL (DUONEB) 0.5-2.5 (3) MG/3ML SOLN    Take 3 mLs by nebulization every 6 (six) hours as needed.   LISINOPRIL (PRINIVIL,ZESTRIL) 20 MG TABLET    Take 1 tablet (20 mg total) by mouth daily.   MULTIPLE VITAMINS-MINERALS (PRESERVISION AREDS 2) CAPS    Take 1 tablet by mouth 2 (two) times daily. Take as directed.   PANTOPRAZOLE (PROTONIX) 40 MG TABLET    Take 1 tablet every morning 30 minutes prior to breakfast.   POLYETHYLENE GLYCOL (MIRALAX / GLYCOLAX) PACKET    Take 17 g by mouth daily as needed for mild constipation.   SENNA-DOCUSATE (SENOKOT-S) 8.6-50 MG PER TABLET    Take 1 tablet by mouth 2 (two) times daily.   SIMVASTATIN (ZOCOR) 20 MG TABLET    Take 1 tab by mouth at night   TOBRAMYCIN (TOBREX) 0.3 % OPHTHALMIC SOLUTION    Place 1 drop into both eyes 2 (two) times daily.   Modified Medications   No medications on file  Discontinued Medications   No medications on file     Physical Exam: Filed Vitals:   04/15/15 1227  BP: 100/56  Pulse: 74  Temp: 97.6 F (36.4 C)  Resp: 20  SpO2: 95%    Physical Exam  Constitutional: He is oriented to person, place, and time. No distress.  Frail elderly male, nad  HENT:  Head: Normocephalic and atraumatic.  Mouth/Throat: Oropharynx is clear and moist. No oropharyngeal exudate.  Eyes: Conjunctivae and EOM are normal. Pupils are equal, round, and reactive to light.  Neck: Normal range of motion. Neck supple.  Cardiovascular: Normal rate, regular rhythm and normal heart sounds.   Pulmonary/Chest: Effort normal. He has decreased breath sounds. He has rhonchi (throughout).  Abdominal: Soft. Bowel sounds are normal.  Musculoskeletal: He  exhibits no edema or tenderness.  Neurological: He is alert and oriented to person, place, and time.  Skin: Skin is warm and dry. He is not diaphoretic.  Left hip with well healed surgical incision   Psychiatric: He has a normal mood and affect.    Labs reviewed: Basic Metabolic Panel:  Recent Labs  08/08/14 0552  03/31/15 0750 04/01/15 0421 04/02/15 0445  NA 129*  < > 129* 125* 128*  K 4.0  < > 3.8 3.9 4.2  CL 87*  < > 90* 88* 87*  CO2 29  < > 31 31 33*  GLUCOSE 106*  < > 105* 102* 109*  BUN  16  < > 26* 23* 24*  CREATININE 1.01  < > 1.08 0.93 0.92  CALCIUM 9.2  < > 8.3* 8.0* 8.2*  MG 1.7  --   --   --   --   < > = values in this interval not displayed. Liver Function Tests:  Recent Labs  08/01/14 0700 08/08/14 0552 08/09/14 0520 03/28/15 0528  AST 22 25 25   --   ALT 16 32 29  --   ALKPHOS 76 70 69  --   BILITOT 0.5 0.3 0.2*  --   PROT 6.3 7.1 6.7  --   ALBUMIN 2.8* 2.7* 2.7* 3.5   No results for input(s): LIPASE, AMYLASE in the last 8760 hours. No results for input(s): AMMONIA in the last 8760 hours. CBC:  Recent Labs  08/01/14 0700  09/02/14 1324 03/27/15 1727  03/29/15 0503 03/30/15 0444 03/31/15 0750  WBC 20.5*  < > 13.3* 10.5  < > 11.3* 11.0* 8.4  NEUTROABS 18.0*  --  10.1* 7.9*  --   --   --   --   HGB 11.5*  < > 12.8* 13.6  < > 12.3* 11.6* 11.7*  HCT 35.9*  < > 40.6 42.4  < > 38.0* 35.7* 35.7*  MCV 84.7  < > 84.3 84.1  < > 85.0 84.0 82.3  PLT PLATELET CLUMPS NOTED ON SMEAR, COUNT APPEARS ADEQUATE  < > 383.0 249  < > 189 176 188  < > = values in this interval not displayed. TSH:  Recent Labs  08/01/14 1535  TSH 1.690   A1C: Lab Results  Component Value Date   HGBA1C 6.2* 03/16/2011   Lipid Panel: No results for input(s): CHOL, HDL, LDLCALC, TRIG, CHOLHDL, LDLDIRECT in the last 8760 hours.  Result Date: 04/06/15 01:09 PM      Analyte   Result Value   Ref. Range    Units   Out of Range   Lab  WBC  9.0  4.0-10.5  K/uL    SLN  RBC  4.25    4.22-5.81  MIL/uL      Hemoglobin  11.6  13.0-17.0  g/dL  L    Hematocrit  33.9  39.0-52.0  %  L    MCV  79.8  78.0-100.0  fL      MCH  27.3  26.0-34.0  pg      MCHC  34.2  30.0-36.0  g/dL      RDW  14.4  11.5-15.5  %      Platelet Count  362  150-400  K/uL      MPV  8.5  8.6-12.4  fL  L    Basic Metabolic Panel  Status: Final Out of Range  Result Date: 04/06/15 01:09 PM      Analyte   Result Value   Ref. Range    Units   Out of Range   Lab  Sodium  126  135-146  mmol/L  L  SLN  Potassium  4.5  3.5-5.3  mmol/L      Chloride  86  98-110  mmol/L  L    CO2  33  20-31  mmol/L  H    Glucose  86  65-99  mg/dL      BUN  27  7-25  mg/dL  H    Creatinine  1.07  0.70-1.11  mg/dL      Calcium  8.6  8.6-10.3  mg/dL    Result Date: 04/11/15 07:50 PM  Analyte   Result Value   Ref. Range    Units   Out of Range   Lab  WBC  19.2  4.0-10.5  K/uL  H  SLN  RBC  3.79  4.22-5.81  MIL/uL  L    Hemoglobin  10.0  13.0-17.0  g/dL  L    Hematocrit  30.1  39.0-52.0  %  L    MCV  79.4  78.0-100.0  fL      MCH  26.4  26.0-34.0  pg      MCHC  33.2  30.0-36.0  g/dL      RDW  14.1  11.5-15.5  %      Platelet Count  571  150-400  K/uL  H    MPV  8.3  8.6-12.4  fL  L    Basic Metabolic Panel  Status: Final Out of Range  Result Date: 04/11/15 07:50 PM      Analyte   Result Value   Ref. Range    Units   Out of Range   Lab  Sodium  126  135-146  mmol/L  L  SLN  Potassium  5.3  3.5-5.3  mmol/L      Chloride  89  98-110  mmol/L  L    CO2  25  20-31  mmol/L      Glucose  94  65-99  mg/dL      BUN  21  7-25  mg/dL      Creatinine  1.19  0.70-1.11  mg/dL  H    Calcium  8.4  8.6-10.3  mg/dL    Result Date: 04/14/15 01:17 PM      Analyte   Result Value   Ref. Range    Units   Out of Range   Lab  WBC  11.3  4.0-10.5  K/uL  H  SLN  RBC  3.59  4.22-5.81  MIL/uL  L    Hemoglobin  9.5  13.0-17.0  g/dL  L    Hematocrit  28.6  39.0-52.0  %  L    MCV  79.7  78.0-100.0  fL      MCH  26.5  26.0-34.0  pg       MCHC  33.2  30.0-36.0  g/dL      RDW  14.8  11.5-15.5  %      Platelet Count  614  150-400  K/uL  H    MPV  8.1  8.6-12.4  fL  L    Basic Metabolic Panel  Status: Final Out of Range  Result Date: 04/14/15 01:17 PM      Analyte   Result Value   Ref. Range    Units   Out of Range   Lab  Sodium  127  135-146  mmol/L  L  SLN  Potassium  5.0  3.5-5.3  mmol/L      Chloride  92  98-110  mmol/L  L    CO2  27  20-31  mmol/L      Glucose  80  65-99  mg/dL      BUN  25  7-25  mg/dL      Creatinine  1.03  0.70-1.11  mg/dL      Calcium  8.3  8.6-10.3  mg/dL  L    Radiological Exams: Dg Chest 1 View  03/27/2015   CLINICAL DATA:  Left hip fracture after fall today.  Preoperative.  EXAM: CHEST  1 VIEW  COMPARISON:  09/02/2014  FINDINGS: Mild emphysematous changes are present as well as chronic appearing basilar interstitial coarsening. There is a benign 5 x 7 mm left upper lobe nodule which is unchanged since at least 10/28/2010. No acute infiltrate or congestive failure is evident.  IMPRESSION: No acute cardiopulmonary findings.   Electronically Signed   By: Andreas Newport M.D.   On: 03/27/2015 18:12   Dg Hip Operative Unilat With Pelvis Left  03/28/2015   CLINICAL DATA:  Internal fixation left hip fracture.  EXAM: OPERATIVE left HIP (WITH PELVIS IF PERFORMED) 3 VIEWS  Fluoroscopy time 0 minutes 7.4 seconds.  TECHNIQUE: Fluoroscopic spot image(s) were submitted for interpretation post-operatively.  COMPARISON:  03/27/2015  FINDINGS: Examination demonstrates placement of 4 orthopedic screws from inferior to the superior bridging patient's femoral neck fracture extending into the femoral head as hardware is intact. There is anatomic alignment about the fracture site. Recommend correlation with findings at the time of the procedure.  IMPRESSION: Internal fixation of left femoral neck fracture with hardware intact.   Electronically Signed   By: Marin Olp M.D.   On: 03/28/2015 15:10   Dg Hip Unilat  With Pelvis 2-3 Views Left  03/27/2015   CLINICAL DATA:  Left hip pain after fall today  EXAM: DG HIP (WITH OR WITHOUT PELVIS) 2-3V LEFT  COMPARISON:  None.  FINDINGS: There is a subcapital left hip fracture with mild valgus angulation. There is no dislocation. There is no bone lesion or bony destruction to suggest a pathologic basis for the fracture.  IMPRESSION: Subcapital left hip fracture   Electronically Signed   By: Andreas Newport M.D.   On: 03/27/2015 18:07    Assessment/Plan  1. Hip fracture, left, sequela S/p closed reduction and screw fixation.  Pain well controlled conts on norco 5-325 mg 1-2 mg q6h prn pain.  -Continue aspirin 325 mg bid for dvt prophylaxis for 4 weeks.  2. Essential hypertension Blood pressure controlled on current regimen  3. Pneumonia, unspecified laterality, unspecified part of lung -doing well with improved cough and congestion, to complete avelox prior to discharge -encouraged ongoing cough, deep breathing and IS  4. Diastolic dysfunction Euvolemic, continue lasix  with o2 and monitor clinically.   5. Esophageal dysmotility Stable, recently with esophageal dilatation per GI, conts on protonix  6. Physical deconditioning Improved with therapy. pt is stable for discharge-will need PT/OT/Nursing/HHA per home health. DME includes O2 and concentrator.  Rx written.  will need to follow up with PCP within 2 weeks.     Carlos American. Harle Battiest  [facility] [phone] 8 am - 5 pm) [phone] (after hours)

## 2015-04-21 ENCOUNTER — Telehealth: Payer: Self-pay | Admitting: Family Medicine

## 2015-04-21 NOTE — Telephone Encounter (Signed)
[name] from [employer] _  Pt recently got out of [facility], Will Dr Damita Dunnings sign the orders?  Cell phone number [phone]

## 2015-04-22 ENCOUNTER — Ambulatory Visit: Payer: Medicare Other | Admitting: Family Medicine

## 2015-04-22 NOTE — Telephone Encounter (Signed)
Spoke with Juliann Pulse from [facility].  She will complete her portion of the form then fax over for signature.

## 2015-04-22 NOTE — Telephone Encounter (Signed)
I'll sign it, patient has OV today.  Thanks.

## 2015-04-23 ENCOUNTER — Telehealth: Payer: Self-pay | Admitting: Family Medicine

## 2015-04-23 ENCOUNTER — Encounter: Payer: Self-pay | Admitting: Family Medicine

## 2015-04-23 ENCOUNTER — Ambulatory Visit (INDEPENDENT_AMBULATORY_CARE_PROVIDER_SITE_OTHER): Payer: Medicare Other | Admitting: Family Medicine

## 2015-04-23 VITALS — BP 140/60 | HR 78 | Temp 98.8°F

## 2015-04-23 DIAGNOSIS — S72002S Fracture of unspecified part of neck of left femur, sequela: Secondary | ICD-10-CM

## 2015-04-23 DIAGNOSIS — R05 Cough: Secondary | ICD-10-CM

## 2015-04-23 DIAGNOSIS — R059 Cough, unspecified: Secondary | ICD-10-CM

## 2015-04-23 DIAGNOSIS — D649 Anemia, unspecified: Secondary | ICD-10-CM

## 2015-04-23 MED ORDER — HYDROCODONE-ACETAMINOPHEN 5-325 MG PO TABS: 1.0000 | ORAL_TABLET | Freq: Four times a day (QID) | ORAL | Status: DC | PRN

## 2015-04-23 MED ORDER — DOXYCYCLINE HYCLATE 100 MG PO TABS
100.0000 mg | ORAL_TABLET | Freq: Two times a day (BID) | ORAL | Status: DC
Start: 2015-04-23 — End: 2015-07-31

## 2015-04-23 NOTE — Telephone Encounter (Signed)
Please call him about follow up, thanks.

## 2015-04-23 NOTE — Progress Notes (Signed)
Pre visit review using our clinic review tool, if applicable. No additional management support is needed unless otherwise documented below in the visit note.  To recap: L hip fx, s/p pin, to [facility] for rehab, now back home.  Can weight bear some but gait isn't back to normal.  Using a walker.  Needs OT/PT/HH set up.  I gave verbal order by phone to OT at [place].  Still with L lateral hip pain, taking prn hydrocodone w/o ADE.  No constipation.  D/w pt and wife- they'll call me if [facility] PT OT isn't set up soon.  Has a ramp at home.  In wheelchair today.    Recently, 2-3 day, with cough and chest congestion.  Better today.  No FCNAVD. No wheeze.  Using prn O2, off O2 at OV, not SOB.  No sputum.    Meds, vitals, and allergies reviewed.   ROS: See HPI.  Otherwise, noncontributory.  nad Elderly male in [facility] Mmm Neck supple, no LA rrr Ctab, occ cough noted but no wheeze and no focal dec in BS abd soft L lateral hip ttp Ext w/o edema

## 2015-04-23 NOTE — Patient Instructions (Signed)
Use the inhaler and if not better in a few days then start the antibiotics.  Use the pain medicine if needed.  Take care.  Glad to see you.  Ask home health about hardware at home.

## 2015-04-23 NOTE — Telephone Encounter (Signed)
Pt did not come in for their appt today for acute visit. Please let me know if pt needs to be contacted immediately for follow up or no follow up needed. Best phone number to contact pt is [phone].

## 2015-04-23 NOTE — Telephone Encounter (Signed)
Patient's wife called and said there's been so much going on she forgot about the appointment.  She rescheduled appointment to today at 3:30.

## 2015-04-24 ENCOUNTER — Telehealth: Payer: Self-pay | Admitting: Family Medicine

## 2015-04-24 LAB — CBC WITH DIFFERENTIAL/PLATELET
BASOS ABS: 0.1 10*3/uL (ref 0.0–0.1)
Basophils Relative: 0.9 % (ref 0.0–3.0)
Eosinophils Absolute: 0.3 10*3/uL (ref 0.0–0.7)
Eosinophils Relative: 2.6 % (ref 0.0–5.0)
HCT: 32.4 % — ABNORMAL LOW (ref 39.0–52.0)
Hemoglobin: 10.7 g/dL — ABNORMAL LOW (ref 13.0–17.0)
LYMPHS ABS: 2.3 10*3/uL (ref 0.7–4.0)
Lymphocytes Relative: 23.5 % (ref 12.0–46.0)
MCHC: 33.1 g/dL (ref 30.0–36.0)
MCV: 81.9 fl (ref 78.0–100.0)
MONO ABS: 0.6 10*3/uL (ref 0.1–1.0)
MONOS PCT: 5.9 % (ref 3.0–12.0)
NEUTROS ABS: 6.7 10*3/uL (ref 1.4–7.7)
NEUTROS PCT: 67.1 % (ref 43.0–77.0)
PLATELETS: 465 10*3/uL — AB (ref 150.0–400.0)
RBC: 3.96 Mil/uL — ABNORMAL LOW (ref 4.22–5.81)
RDW: 14.7 % (ref 11.5–15.5)
WBC: 9.9 10*3/uL (ref 4.0–10.5)

## 2015-04-24 LAB — BASIC METABOLIC PANEL
BUN: 19 mg/dL (ref 6–23)
CALCIUM: 8.7 mg/dL (ref 8.4–10.5)
CO2: 29 meq/L (ref 19–32)
Chloride: 97 mEq/L (ref 96–112)
Creatinine, Ser: 0.91 mg/dL (ref 0.40–1.50)
GFR: 83.52 mL/min (ref >60.00)
GLUCOSE: 76 mg/dL (ref 70–99)
Potassium: 4.2 mEq/L (ref 3.5–5.1)
Sodium: 134 mEq/L — ABNORMAL LOW (ref 135–145)

## 2015-04-24 NOTE — Assessment & Plan Note (Addendum)
Benign chest exam today.  Continue prn saba, hold doxy for now.  Use doxy if not improved in the next few days.  He agrees. >25 minutes spent in face to face time with patient, >50% spent in counselling or coordination of care.

## 2015-04-24 NOTE — Telephone Encounter (Signed)
Please give the order.  Thanks.   

## 2015-04-24 NOTE — Assessment & Plan Note (Signed)
To recap: L hip fx, s/p pin, to [facility] for rehab, now back home. Can weight bear some but gait isn't back to normal. Using a walker. Needs OT/PT/HH set up. I gave verbal order by phone to OT at [place]. Still with L lateral hip pain, taking prn hydrocodone w/o ADE. No constipation. D/w pt and wife- they'll call me if [facility] PT OT isn't set up soon. Has a ramp at home.  Continue meds as listed in EMR.  D/w pt.  He agrees. Hydrocodone refilled.

## 2015-04-24 NOTE — Telephone Encounter (Signed)
erin from [employer] called Request verbal order to request home health pt 2 times a week for 6 weeks  For strengthening and gait training.  cb number is [phone]

## 2015-04-27 ENCOUNTER — Other Ambulatory Visit: Payer: Self-pay | Admitting: Family Medicine

## 2015-04-27 DIAGNOSIS — D649 Anemia, unspecified: Secondary | ICD-10-CM

## 2015-04-27 NOTE — Telephone Encounter (Signed)
Erin with [facility] advised.

## 2015-04-28 ENCOUNTER — Encounter: Payer: Self-pay | Admitting: *Deleted

## 2015-05-05 ENCOUNTER — Telehealth: Payer: Self-pay

## 2015-05-05 NOTE — Telephone Encounter (Signed)
Venda Rodes [facility] nurse notified as instructed and voiced understanding.

## 2015-05-05 NOTE — Telephone Encounter (Signed)
Jamela nurse with [facility] is at pts home; pt fell earlier this morning; vital signs stable but has 2 skin tears to rt arm. One tear is 1.8 x 3.5 cm and other skin tear is 5 cm long. Jamela request order to cleanse with N/S. Apply xeroform (vaseline type dressing) and cover with tegaderm. Jamela request cb.

## 2015-05-05 NOTE — Telephone Encounter (Signed)
Please give the order as below with f/u routine wound care as needed.  Tetanus 2013, FYI.  Thanks.

## 2015-05-06 ENCOUNTER — Telehealth: Payer: Self-pay

## 2015-05-06 NOTE — Telephone Encounter (Signed)
Mrs Hodgman left v/m; pt will not use in home oxygen; Mrs Clymer wanted oxygen removed from the home but was advised Dr Damita Dunnings would have to send order to have oxygen removed to [place] # [phone]. Mrs Pettet said when home health comes out the pts oxygen level is OK. (not sure what pulse ox level is). Mrs Mysliwiec request cb.

## 2015-05-07 NOTE — Telephone Encounter (Signed)
Spoke with [facility]. They said that they would need a discontinue order faxed to [phone] in order to remove the oxygen from the home. Thanks!

## 2015-05-07 NOTE — Telephone Encounter (Signed)
He was only using intermittently.  Was 95% here off O2.   Okay to remove.   Please give the order.   Thanks.

## 2015-05-07 NOTE — Telephone Encounter (Signed)
Letter done. Thanks.

## 2015-05-07 NOTE — Telephone Encounter (Signed)
Letter faxed.

## 2015-05-11 ENCOUNTER — Telehealth: Payer: Self-pay

## 2015-05-11 NOTE — Telephone Encounter (Signed)
Noted. Ok to do. Thanks. To PCP as fyi.

## 2015-05-11 NOTE — Telephone Encounter (Signed)
Thanks Agree 

## 2015-05-11 NOTE — Telephone Encounter (Signed)
[name] with [facility] left v/m requesting verbal order for home health social worker to do evaluation for pt with long range planning and community resources; [name] is struggling caring for pt. As [name] pt's wife declined home health aide services.

## 2015-05-12 NOTE — Telephone Encounter (Signed)
Cindy notified.

## 2015-05-28 ENCOUNTER — Telehealth: Payer: Self-pay

## 2015-05-28 NOTE — Telephone Encounter (Signed)
[place] worker with [facility] left v/m requesting verbal orders for additional visits for October for additional help in the home and to review VA benefits with them.

## 2015-05-28 NOTE — Telephone Encounter (Signed)
Left voicemail giving verbal orders to [name]

## 2015-05-28 NOTE — Telephone Encounter (Signed)
Please give the order.  Thanks.   

## 2015-05-29 ENCOUNTER — Other Ambulatory Visit: Payer: Medicare Other

## 2015-06-01 ENCOUNTER — Telehealth: Payer: Self-pay

## 2015-06-01 NOTE — Telephone Encounter (Signed)
Marlowe Kays OT with [facility] left v/m requesting verbal order to extend OT home health for 2 x a week for 2 weeks.

## 2015-06-01 NOTE — Telephone Encounter (Signed)
Please give the order.  Thanks.   

## 2015-06-01 NOTE — Telephone Encounter (Signed)
Marlowe Kays with [facility] given order as instructed.

## 2015-06-03 ENCOUNTER — Telehealth: Payer: Self-pay | Admitting: Family Medicine

## 2015-06-03 NOTE — Telephone Encounter (Signed)
Please give the order.  Thanks.   

## 2015-06-03 NOTE — Telephone Encounter (Signed)
Order given as instructed by telephone.

## 2015-06-03 NOTE — Telephone Encounter (Signed)
[name] from [employer] called - wants a verbal order to d/c to clean with normal sailne , apply xerifoam and cover with She states that wound is healed  cb number [phone]

## 2015-06-08 ENCOUNTER — Telehealth: Payer: Self-pay | Admitting: Family Medicine

## 2015-06-08 NOTE — Telephone Encounter (Signed)
Wes at [facility] is calling to get verbal orders for an extension for 2 x a week for 2 weeks for physical therapy.  Please call Wes back with orders.

## 2015-06-09 NOTE — Telephone Encounter (Signed)
Left message with orders. 

## 2015-06-09 NOTE — Telephone Encounter (Signed)
Please give the order.  Thanks.   

## 2015-07-31 ENCOUNTER — Encounter: Payer: Self-pay | Admitting: Family Medicine

## 2015-07-31 ENCOUNTER — Ambulatory Visit (INDEPENDENT_AMBULATORY_CARE_PROVIDER_SITE_OTHER): Payer: Medicare Other | Admitting: Family Medicine

## 2015-07-31 VITALS — BP 144/68 | HR 75 | Temp 98.3°F | Wt 143.5 lb

## 2015-07-31 DIAGNOSIS — I509 Heart failure, unspecified: Secondary | ICD-10-CM

## 2015-07-31 DIAGNOSIS — R059 Cough, unspecified: Secondary | ICD-10-CM

## 2015-07-31 DIAGNOSIS — R05 Cough: Secondary | ICD-10-CM

## 2015-07-31 DIAGNOSIS — S72002S Fracture of unspecified part of neck of left femur, sequela: Secondary | ICD-10-CM

## 2015-07-31 DIAGNOSIS — I5033 Acute on chronic diastolic (congestive) heart failure: Secondary | ICD-10-CM | POA: Diagnosis not present

## 2015-07-31 MED ORDER — HYDROCODONE-ACETAMINOPHEN 5-325 MG PO TABS: 1.0000 | ORAL_TABLET | Freq: Four times a day (QID) | ORAL | Status: DC | PRN

## 2015-07-31 MED ORDER — AZITHROMYCIN 250 MG PO TABS: ORAL_TABLET | ORAL | Status: DC

## 2015-07-31 MED ORDER — AMLODIPINE BESYLATE 10 MG PO TABS: 10.0000 mg | ORAL_TABLET | Freq: Every day | ORAL | Status: DC

## 2015-07-31 MED ORDER — ALBUTEROL SULFATE HFA 108 (90 BASE) MCG/ACT IN AERS: 2.0000 | INHALATION_SPRAY | Freq: Four times a day (QID) | RESPIRATORY_TRACT | Status: DC | PRN

## 2015-07-31 NOTE — Progress Notes (Signed)
Pre visit review using our clinic review tool, if applicable. No additional management support is needed unless otherwise documented below in the visit note.  Still with some pain with weight bearing in the L hip from pinning on 03/28/15.  Occ hydrocodone use, no ADE on med.    Cough.  Still on baseline meds.  Worse in the last week.  Some sputum.  No fevers.  No ear pain, no rhinorrhea.  More wheeze recently, hadn't used SABA recently.  Has had a wet sounding cough.  Would walk with his walker to the car from the exam room.  No rash.  No ST.  Wife has been sick.   Has been off BP meds for >1 week.  He didn't realize that he had run out of meds.  He has had more trouble with med mgmt and was okay with [facility] referral, d/w pt.   H/o CHF.   PMH and SH reviewed  ROS: See HPI, otherwise noncontributory.  Meds, vitals, and allergies reviewed.   nad ncat Mmm Op wnl Neck supple, no LA rrr Coarse rhonchi B but no focal dec in BS, no inc in wob abd soft Ext with trace BLE edema Skin well perfused.

## 2015-07-31 NOTE — Patient Instructions (Signed)
I sent your BP medicine (amlodipine).  Restart that.  If BP stays up, then we can restart the lisinopril.  Update me as needed.   Start zithromax and use the inhaler.  Update me if not better.

## 2015-08-02 ENCOUNTER — Encounter: Payer: Self-pay | Admitting: Family Medicine

## 2015-08-02 NOTE — Assessment & Plan Note (Signed)
Pain improved but not resolved, I did write for hydrocodone rx for patient to have as needed.  He agrees.

## 2015-08-02 NOTE — Assessment & Plan Note (Signed)
H/o CHF, with trouble with med mgmt.  With ~1 week off BP meds, and with BP as is today, would shouldn't restart all of his BP meds.   Restart amlodipine.  If BP stays up, then we can restart the lisinopril. Update me as needed.  He agrees.

## 2015-08-02 NOTE — Assessment & Plan Note (Signed)
Would treat given his sx and hx.  Doesn't appear to have PNA, the concern is for bronchitis.  Start zmax.  He agrees.  Still okay for outpatient f/u.

## 2015-08-03 ENCOUNTER — Telehealth: Payer: Self-pay | Admitting: Family Medicine

## 2015-08-03 DIAGNOSIS — I509 Heart failure, unspecified: Secondary | ICD-10-CM

## 2015-08-03 NOTE — Telephone Encounter (Signed)
HH order put in EMR.  Thanks.

## 2015-08-05 ENCOUNTER — Telehealth: Payer: Self-pay | Admitting: Family Medicine

## 2015-08-05 NOTE — Telephone Encounter (Signed)
Noted, thanks!

## 2015-08-05 NOTE — Telephone Encounter (Signed)
Called patients wife [name], about the [facility] referral you put in. They are not wanting the referral at this time, if they change their minds they will call us back. I will cancel the referral in Epic.

## 2015-08-11 ENCOUNTER — Observation Stay (HOSPITAL_COMMUNITY)
Admission: EM | Admit: 2015-08-11 | Discharge: 2015-08-13 | Disposition: A | Discharge status: Home / Self Care | Payer: Medicare Other | Attending: Internal Medicine | Admitting: Internal Medicine

## 2015-08-11 ENCOUNTER — Observation Stay (HOSPITAL_COMMUNITY): Payer: Medicare Other

## 2015-08-11 ENCOUNTER — Encounter (HOSPITAL_COMMUNITY): Payer: Self-pay | Admitting: Emergency Medicine

## 2015-08-11 ENCOUNTER — Emergency Department (HOSPITAL_COMMUNITY): Payer: Medicare Other

## 2015-08-11 DIAGNOSIS — Z7982 Long term (current) use of aspirin: Secondary | ICD-10-CM | POA: Insufficient documentation

## 2015-08-11 DIAGNOSIS — Z86010 Personal history of colonic polyps: Secondary | ICD-10-CM | POA: Diagnosis not present

## 2015-08-11 DIAGNOSIS — G459 Transient cerebral ischemic attack, unspecified: Principal | ICD-10-CM | POA: Diagnosis present

## 2015-08-11 DIAGNOSIS — Z8701 Personal history of pneumonia (recurrent): Secondary | ICD-10-CM | POA: Insufficient documentation

## 2015-08-11 DIAGNOSIS — Z66 Do not resuscitate: Secondary | ICD-10-CM | POA: Diagnosis not present

## 2015-08-11 DIAGNOSIS — K219 Gastro-esophageal reflux disease without esophagitis: Secondary | ICD-10-CM | POA: Insufficient documentation

## 2015-08-11 DIAGNOSIS — F0391 Unspecified dementia with behavioral disturbance: Secondary | ICD-10-CM | POA: Diagnosis not present

## 2015-08-11 DIAGNOSIS — E538 Deficiency of other specified B group vitamins: Secondary | ICD-10-CM | POA: Diagnosis not present

## 2015-08-11 DIAGNOSIS — R05 Cough: Secondary | ICD-10-CM

## 2015-08-11 DIAGNOSIS — R059 Cough, unspecified: Secondary | ICD-10-CM

## 2015-08-11 DIAGNOSIS — I7389 Other specified peripheral vascular diseases: Secondary | ICD-10-CM | POA: Diagnosis not present

## 2015-08-11 DIAGNOSIS — Z87891 Personal history of nicotine dependence: Secondary | ICD-10-CM | POA: Insufficient documentation

## 2015-08-11 DIAGNOSIS — R4701 Aphasia: Secondary | ICD-10-CM

## 2015-08-11 DIAGNOSIS — H3530 Unspecified macular degeneration: Secondary | ICD-10-CM | POA: Diagnosis not present

## 2015-08-11 DIAGNOSIS — I10 Essential (primary) hypertension: Secondary | ICD-10-CM | POA: Diagnosis not present

## 2015-08-11 DIAGNOSIS — E785 Hyperlipidemia, unspecified: Secondary | ICD-10-CM | POA: Insufficient documentation

## 2015-08-11 DIAGNOSIS — I639 Cerebral infarction, unspecified: Secondary | ICD-10-CM | POA: Diagnosis present

## 2015-08-11 HISTORY — DX: Cerebral infarction, unspecified: I63.9

## 2015-08-11 LAB — I-STAT CHEM 8, ED
BUN: 18 mg/dL (ref 6–20)
Calcium, Ion: 1.13 mmol/L (ref 1.13–1.30)
Chloride: 95 mmol/L — ABNORMAL LOW (ref 101–111)
Creatinine, Ser: 1.1 mg/dL (ref 0.61–1.24)
Glucose, Bld: 90 mg/dL (ref 65–99)
HEMATOCRIT: 45 % (ref 39.0–52.0)
HEMOGLOBIN: 15.3 g/dL (ref 13.0–17.0)
POTASSIUM: 3.9 mmol/L (ref 3.5–5.1)
SODIUM: 135 mmol/L (ref 135–145)
TCO2: 29 mmol/L (ref 0–100)

## 2015-08-11 LAB — I-STAT TROPONIN, ED: Troponin i, poc: 0.01 ng/mL (ref 0.00–0.08)

## 2015-08-11 LAB — DIFFERENTIAL
BASOS ABS: 0 10*3/uL (ref 0.0–0.1)
BASOS PCT: 0 %
EOS ABS: 0.2 10*3/uL (ref 0.0–0.7)
EOS PCT: 2 %
Lymphocytes Relative: 28 %
Lymphs Abs: 2.1 10*3/uL (ref 0.7–4.0)
Monocytes Absolute: 0.5 10*3/uL (ref 0.1–1.0)
Monocytes Relative: 7 %
NEUTROS PCT: 63 %
Neutro Abs: 4.8 10*3/uL (ref 1.7–7.7)

## 2015-08-11 LAB — CBC
HCT: 41.2 % (ref 39.0–52.0)
Hemoglobin: 13.4 g/dL (ref 13.0–17.0)
MCH: 27.2 pg (ref 26.0–34.0)
MCHC: 32.5 g/dL (ref 30.0–36.0)
MCV: 83.7 fL (ref 78.0–100.0)
Platelets: 262 10*3/uL (ref 150–400)
RBC: 4.92 MIL/uL (ref 4.22–5.81)
RDW: 15.1 % (ref 11.5–15.5)
WBC: 7.6 10*3/uL (ref 4.0–10.5)

## 2015-08-11 LAB — PROTIME-INR
INR: 0.98 (ref 0.00–1.49)
PROTHROMBIN TIME: 13.2 s (ref 11.6–15.2)

## 2015-08-11 LAB — COMPREHENSIVE METABOLIC PANEL
ALBUMIN: 3.8 g/dL (ref 3.5–5.0)
ALT: 14 U/L — ABNORMAL LOW (ref 17–63)
ANION GAP: 8 (ref 5–15)
AST: 20 U/L (ref 15–41)
Alkaline Phosphatase: 100 U/L (ref 38–126)
BILIRUBIN TOTAL: 0.8 mg/dL (ref 0.3–1.2)
BUN: 18 mg/dL (ref 6–20)
CALCIUM: 9 mg/dL (ref 8.9–10.3)
CO2: 30 mmol/L (ref 22–32)
CREATININE: 1.15 mg/dL (ref 0.61–1.24)
Chloride: 96 mmol/L — ABNORMAL LOW (ref 101–111)
GFR calc Af Amer: >60 mL/min (ref >60)
GFR calc non Af Amer: 55 mL/min — ABNORMAL LOW (ref >60)
Glucose, Bld: 92 mg/dL (ref 65–99)
Potassium: 4 mmol/L (ref 3.5–5.1)
Sodium: 134 mmol/L — ABNORMAL LOW (ref 135–145)
TOTAL PROTEIN: 7.2 g/dL (ref 6.5–8.1)

## 2015-08-11 LAB — APTT: APTT: 29 s (ref 24–37)

## 2015-08-11 LAB — ETHANOL

## 2015-08-11 MED ORDER — SIMVASTATIN 20 MG PO TABS
20.0000 mg | ORAL_TABLET | Freq: Every day | ORAL | Status: DC
  Administered 2015-08-11 – 2015-08-12 (×2): 20 mg via ORAL
  Filled 2015-08-11 (×2): qty 1

## 2015-08-11 MED ORDER — ALBUTEROL SULFATE HFA 108 (90 BASE) MCG/ACT IN AERS: 2.0000 | INHALATION_SPRAY | Freq: Four times a day (QID) | RESPIRATORY_TRACT | Status: DC | PRN

## 2015-08-11 MED ORDER — TOBRAMYCIN 0.3 % OP SOLN
1.0000 [drp] | Freq: Two times a day (BID) | OPHTHALMIC | Status: DC
  Administered 2015-08-11 – 2015-08-13 (×4): 1 [drp] via OPHTHALMIC
  Filled 2015-08-11: qty 5

## 2015-08-11 MED ORDER — ALBUTEROL SULFATE (2.5 MG/3ML) 0.083% IN NEBU: 2.5000 mg | INHALATION_SOLUTION | Freq: Four times a day (QID) | RESPIRATORY_TRACT | Status: DC | PRN

## 2015-08-11 MED ORDER — PRESERVISION AREDS 2 PO CAPS: 1.0000 | ORAL_CAPSULE | Freq: Two times a day (BID) | ORAL | Status: DC

## 2015-08-11 MED ORDER — SENNOSIDES-DOCUSATE SODIUM 8.6-50 MG PO TABS: 1.0000 | ORAL_TABLET | Freq: Every evening | ORAL | Status: DC | PRN

## 2015-08-11 MED ORDER — STROKE: EARLY STAGES OF RECOVERY BOOK
Freq: Once | Status: AC
  Administered 2015-08-11: 23:00:00

## 2015-08-11 MED ORDER — OCUVITE-LUTEIN PO CAPS
1.0000 | ORAL_CAPSULE | Freq: Every day | ORAL | Status: DC
  Administered 2015-08-12 – 2015-08-13 (×2): 1 via ORAL
  Filled 2015-08-11 (×4): qty 1

## 2015-08-11 MED ORDER — ENOXAPARIN SODIUM 40 MG/0.4ML ~~LOC~~ SOLN
40.0000 mg | SUBCUTANEOUS | Status: DC
  Administered 2015-08-11 – 2015-08-12 (×2): 40 mg via SUBCUTANEOUS
  Filled 2015-08-11 (×2): qty 0.4

## 2015-08-11 MED ORDER — PANTOPRAZOLE SODIUM 40 MG PO TBEC
40.0000 mg | DELAYED_RELEASE_TABLET | Freq: Every day | ORAL | Status: DC
  Administered 2015-08-12 – 2015-08-13 (×2): 40 mg via ORAL
  Filled 2015-08-11 (×2): qty 1

## 2015-08-11 MED ORDER — HYDROCODONE-ACETAMINOPHEN 5-325 MG PO TABS: 1.0000 | ORAL_TABLET | Freq: Four times a day (QID) | ORAL | Status: DC | PRN

## 2015-08-11 MED ORDER — DOCUSATE SODIUM 100 MG PO CAPS
100.0000 mg | ORAL_CAPSULE | Freq: Two times a day (BID) | ORAL | Status: DC
  Administered 2015-08-11 – 2015-08-13 (×4): 100 mg via ORAL
  Filled 2015-08-11 (×4): qty 1

## 2015-08-11 MED ORDER — SODIUM CHLORIDE 0.9 % IV SOLN
INTRAVENOUS | Status: DC
  Administered 2015-08-11: 23:00:00 via INTRAVENOUS

## 2015-08-11 NOTE — ED Notes (Signed)
MD [facility] aware of pt.

## 2015-08-11 NOTE — Progress Notes (Signed)
Pt arrived to 5M09 via [facility] from [place]. Pt alert and oriented x 4. Vitals signs taken and stable. Telemetry box applied. Pt oriented to room and call-bell.

## 2015-08-11 NOTE — ED Notes (Signed)
Pt's wife reports around 1130 today he had an aphasic episode where "we couldn't understand a thing he said." Hx "mini stroke." Is not on blood thinners. Pt has slight left side pronator drift and slight left sided facial droop. Denies prior deficit from mini stroke "years ago." Able to speak clearly at this time but is unable to report current year/time/month but is able to report his birthday. Wife says he is usually able to remember this information. Denies recent sickness/urinary changes. Takes blood pressure medication. No other c/c.

## 2015-08-11 NOTE — ED Provider Notes (Signed)
CSN: [id number]     Arrival date & time 08/11/15  1329 History   First MD Initiated Contact with Patient 08/11/15 1353     Chief Complaint  Patient presents with  . Aphasia  . Hypertension     (Consider location/radiation/quality/duration/timing/severity/associated sxs/prior Treatment) Patient is a 81 y.o. male presenting with hypertension and general illness. The history is provided by the patient.  Hypertension Pertinent negatives include no chest pain, no abdominal pain, no headaches and no shortness of breath.  Illness Severity:  Mild Onset quality:  Sudden Duration:  1 hour Timing:  Rare Progression:  Resolved Chronicity:  New Associated symptoms: no abdominal pain, no chest pain, no congestion, no diarrhea, no fever, no headaches, no myalgias, no rash, no shortness of breath and no vomiting   Associated symptoms comment:  Aphasia   81 yo M with a chief complaint of aphasia. Family stated that he had really garbled speech that lasted for about 15 minutes. They became concerned that he is having a stroke and called 911. States that he's had a history of a "mini stroke "in the past.   Past Medical History  Diagnosis Date  . Hypertension   . Hyperlipidemia   . Tobacco abuse   . Lung nodule     Left lung, seen 2012, no change in 2012, no follow up needed   . PNA (pneumonia) 2010    Bilateral Pneumonia, COPD 65mm LLL Nodule   . History of ETT 09/1991     POS   . History of MRI 04-22-06    L/S- right hnp  l5/si   . B12 deficiency   . TIA (transient ischemic attack)   . GERD (gastroesophageal reflux disease)   . Esophageal stricture   . Hemorrhoids   . Colon polyps     hyperplastic  . Pneumonia   . Macular degeneration   . Stroke [facility])    Past Surgical History  Procedure Laterality Date  . Cystectomy  1995    Lumbar area   . Hip pinning,cannulated Left 03/28/2015    Procedure: CANNULATED HIP PINNING;  Surgeon: [name], MD;  Location: [facility];  Service:  Orthopedics;  Laterality: Left;   Family History  Problem Relation Age of Onset  . Hypertension Mother   . Heart attack Father   . Colon cancer Neg Hx   . Esophageal cancer Neg Hx    Social History  Substance Use Topics  . Smoking status: Former Smoker    Types: Cigarettes    Quit date: 08/29/1985  . Smokeless tobacco: Former Systems developer    Types: Chew  . Alcohol Use: No    Review of Systems  Constitutional: Negative for fever and chills.  HENT: Negative for congestion and facial swelling.   Eyes: Negative for discharge and visual disturbance.  Respiratory: Negative for shortness of breath.   Cardiovascular: Negative for chest pain and palpitations.  Gastrointestinal: Negative for vomiting, abdominal pain and diarrhea.  Musculoskeletal: Negative for myalgias and arthralgias.  Skin: Negative for color change and rash.  Neurological: Positive for speech difficulty. Negative for tremors, syncope and headaches.  Psychiatric/Behavioral: Negative for confusion and dysphoric mood.      Allergies  Review of patient's allergies indicates no known allergies.  Home Medications   Prior to Admission medications   Medication Sig Start Date End Date Taking? Authorizing Provider  albuterol (PROVENTIL HFA;VENTOLIN HFA) 108 (90 BASE) MCG/ACT inhaler Inhale 2 puffs into the lungs every 6 (six) hours as needed for wheezing  or shortness of breath. 07/31/15  Yes Tonia Ghent, MD  amLODipine (NORVASC) 10 MG tablet Take 1 tablet (10 mg total) by mouth daily. 07/31/15  Yes Tonia Ghent, MD  docusate sodium (COLACE) 100 MG capsule Take 1 capsule (100 mg total) by mouth 2 (two) times daily. 03/30/15  Yes Modena Jansky, MD  HYDROcodone-acetaminophen (NORCO) 5-325 MG tablet Take 1 tablet by mouth every 6 (six) hours as needed. 07/31/15  Yes Tonia Ghent, MD  lisinopril (PRINIVIL,ZESTRIL) 20 MG tablet Take 20 mg by mouth daily.   Yes Historical Provider, MD  NON FORMULARY Oxygen 2 liters as needed    Yes Historical Provider, MD  pantoprazole (PROTONIX) 40 MG tablet Take 40 mg by mouth daily.   Yes Historical Provider, MD  simvastatin (ZOCOR) 20 MG tablet Take 20 mg by mouth daily.   Yes Historical Provider, MD  tobramycin (TOBREX) 0.3 % ophthalmic solution Place 1 drop into both eyes 2 (two) times daily.  02/27/15  Yes Historical Provider, MD  aspirin EC 325 MG tablet Take 1 tablet (325 mg total) by mouth 2 (two) times daily. Take for 4 weeks 03/30/15   [name], MD  azithromycin (ZITHROMAX) 250 MG tablet 2 tabs a day for 1 day and then 1 a day for 4 days. 07/31/15   Tonia Ghent, MD  Multiple Vitamins-Minerals (PRESERVISION AREDS 2) CAPS Take 1 tablet by mouth 2 (two) times daily. Take as directed. 03/30/15   Modena Jansky, MD   BP 184/65 mmHg  Pulse 86  Temp(Src) 98.1 F (36.7 C) (Oral)  Resp 16  SpO2 95% Physical Exam  Constitutional: He is oriented to person, place, and time. He appears well-developed and well-nourished.  HENT:  Head: Normocephalic and atraumatic.  Eyes: EOM are normal. Pupils are equal, round, and reactive to light.  Neck: Normal range of motion. Neck supple. No JVD present.  Cardiovascular: Normal rate and regular rhythm.  Exam reveals no gallop and no friction rub.   No murmur heard. Pulmonary/Chest: No respiratory distress. He has no wheezes.  Abdominal: He exhibits no distension. There is no rebound and no guarding.  Musculoskeletal: Normal range of motion.  Neurological: He is alert and oriented to person, place, and time. He has normal strength. No cranial nerve deficit or sensory deficit. Gait abnormal. Coordination normal. GCS eye subscore is 4. GCS verbal subscore is 5. GCS motor subscore is 6. He displays no Babinski's sign on the right side. He displays no Babinski's sign on the left side.  Reflex Scores:      Tricep reflexes are 2+ on the right side and 2+ on the left side.      Bicep reflexes are 2+ on the right side and 2+ on the left side.       Brachioradialis reflexes are 2+ on the right side and 2+ on the left side.      Patellar reflexes are 2+ on the right side and 2+ on the left side.      Achilles reflexes are 2+ on the right side and 2+ on the left side. Unsteady gait, at baseline per family  Skin: No rash noted. No pallor.  Psychiatric: He has a normal mood and affect. His behavior is normal.  Nursing note and vitals reviewed.   ED Course  Procedures (including critical care time) Labs Review Labs Reviewed  COMPREHENSIVE METABOLIC PANEL - Abnormal; Notable for the following:    Sodium 134 (*)    Chloride  96 (*)    ALT 14 (*)    GFR calc non Af Amer 55 (*)    All other components within normal limits  I-STAT CHEM 8, ED - Abnormal; Notable for the following:    Chloride 95 (*)    All other components within normal limits  ETHANOL  PROTIME-INR  APTT  CBC  DIFFERENTIAL  URINE RAPID DRUG SCREEN, HOSP PERFORMED  URINALYSIS, ROUTINE W REFLEX MICROSCOPIC (NOT AT [facility])  I-STAT TROPOININ, ED    Imaging Review Ct Head Wo Contrast  08/11/2015  CLINICAL DATA:  Acute onset aphasia EXAM: CT HEAD WITHOUT CONTRAST TECHNIQUE: Contiguous axial images were obtained from the base of the skull through the vertex without intravenous contrast. COMPARISON:  August 08, 2014 FINDINGS: Moderate diffuse atrophy is stable. There is no intracranial mass, hemorrhage, extra-axial fluid collection, or midline shift. There is patchy small vessel disease throughout the centra semiovale bilaterally, somewhat more severe on the right than on the left, and stable. There is a prior lacunar type infarct in the posterior limb of the left internal capsule involving the lateral most aspect of the left thalamus, stable. There is no new gray-white compartment lesion. No acute infarct is evident. The bony calvarium appears intact. Visualized mastoid air cells are clear. No intraorbital lesions are identified. IMPRESSION: Atrophy with patchy periventricular  small vessel disease, stable. Prior lacunar type infarct in the posterior limb of the left internal capsule involving the lateral most aspect of the left thalamus, stable. No acute infarct evident. No hemorrhage or mass effect. Electronically Signed   By: Lowella Grip III M.D.   On: 08/11/2015 14:51   I have personally reviewed and evaluated these images and lab results as part of my medical decision-making.   EKG Interpretation   Date/Time:  Tuesday August 11 2015 13:42:15 EST Ventricular Rate:  68 PR Interval:  172 QRS Duration: 91 QT Interval:  403 QTC Calculation: 429 R Axis:   26 Text Interpretation:  Sinus rhythm Abnormal R-wave progression, early  transition No significant change since last tracing Confirmed by Jeanae Whitmill MD,  Quillian Quince [id number]) on 08/11/2015 2:48:28 PM      MDM   Final diagnoses:  Aphasia    81 yo M with a cc of aphasia. This completely resolved. No recent make stroke currently. Stroke workup ordered. Will discuss case with neurology.  Neuro feels likely stroke, will admit.   The patients results and plan were reviewed and discussed.   Any x-rays performed were independently reviewed by myself.   Differential diagnosis were considered with the presenting HPI.  Medications - No data to display  Filed Vitals:   08/11/15 1335 08/11/15 1521  BP: 215/85 184/65  Pulse: 65 86  Temp: 98.1 F (36.7 C)   TempSrc: Oral   Resp: 16 16  SpO2: 100% 95%    Final diagnoses:  Aphasia    Admission/ observation were discussed with the admitting physician, patient and/or family and they are comfortable with the plan.    Deno Etienne, DO 08/11/15 [phone]

## 2015-08-11 NOTE — Consult Note (Signed)
Neurology Consultation Reason for Consult: [place] Referring Physician:   CC: Transient aphasia  History is obtained from: Patient, family  HPI: [name] is a 81 y.o. male with a history of hypertension, hyperlipidemia who presents with transient aphasia earlier today. His wife is with him and states that he suddenly stopped making sense. There were no words, rather "gobeldy-gook." This lasted for approximately 15 minutes after which the patient's speech returned normal. He has full memory of the episode and states that he was unable to understand anything that anybody was saying. He denies numbness, weakness, changes in consciousness, lightheadedness.  Of note the patient ran out of his aspirin one week ago, and was recently taken off some of his blood pressure medicine now has a systolic of [id number].  LKW: 11:30 AM tpa given?: no, resolution of symptoms    ROS: A 14 point ROS was performed and is negative except as noted in the HPI.   Past Medical History  Diagnosis Date  . Hypertension   . Hyperlipidemia   . Tobacco abuse   . Lung nodule     Left lung, seen 2012, no change in 2012, no follow up needed   . PNA (pneumonia) 2010    Bilateral Pneumonia, COPD 20mm LLL Nodule   . History of ETT 09/1991     POS   . History of MRI 04-22-06    L/S- right hnp  l5/si   . B12 deficiency   . TIA (transient ischemic attack)   . GERD (gastroesophageal reflux disease)   . Esophageal stricture   . Hemorrhoids   . Colon polyps     hyperplastic  . Pneumonia   . Macular degeneration   . Stroke [facility])      Family History  Problem Relation Age of Onset  . Hypertension Mother   . Heart attack Father   . Colon cancer Neg Hx   . Esophageal cancer Neg Hx      Social History:  reports that he quit smoking about 29 years ago. His smoking use included Cigarettes. He has quit using smokeless tobacco. His smokeless tobacco use included Chew. He reports that he does not drink alcohol or use  illicit drugs.   Exam: Current vital signs: BP 184/65 mmHg  Pulse 86  Temp(Src) 98.1 F (36.7 C) (Oral)  Resp 16  SpO2 95% Vital signs in last 24 hours: Temp:  [98.1 F (36.7 C)] 98.1 F (36.7 C) (12/13 1335) Pulse Rate:  [64-86] 86 (12/13 1521) Resp:  [15-16] 16 (12/13 1521) BP: (184-215)/(65-85) 184/65 mmHg (12/13 1521) SpO2:  [95 %-100 %] 95 % (12/13 1521)   Physical Exam  Constitutional: Appears elderly Psych: Affect appropriate to situation Eyes: No scleral injection HENT: No OP obstrucion Head: Normocephalic.  Cardiovascular: Normal rate and regular rhythm.  Respiratory: Effort normal  GI: Soft.   Skin: WDI  Neuro: Mental Status: Patient is awake, alert, oriented to person, place, year, and situation. Hesitates on month, but then gets it correct.  Patient is able to give a clear and coherent history. No signs of aphasia or neglect Cranial Nerves: II: Visual Fields are full. Pupils are equal, round, and reactive to light.   III,IV, VI: EOMI without ptosis or diploplia.  V: Facial sensation is symmetric to temperature VII: Facial movement is flattented NL fold on the left.  VIII: hearing is intact to voice X: Uvula elevates symmetrically XI: Shoulder shrug is symmetric. XII: tongue is midline without atrophy or fasciculations.  Motor:  Tone is normal. Bulk is normal. 5/5 strength was present in all four extremities.  Sensory: Sensation is symmetric to light touch and temperature in the arms and legs. Cerebellar: He has a mild intentional tremor bilaterally.      I have reviewed labs in epic and the results pertinent to this consultation are: cmp - unremarkable  I have reviewed the images obtained:CT head -  No acute findings  Impression: 81 yo M with transient aphasia with preserved conciousness concerning for TIA. He has recently had his BP meds decreased and is out of ASA for one week. I would favor admission for risk factor modification and  restarting asa.   Recommendations: 1. HgbA1c, fasting lipid panel 2. MRI, MRA  of the brain without contrast 3. Frequent neuro checks 4. Echocardiogram 5. Carotid dopplers 6. Prophylactic therapy-Antiplatelet med: Aspirin - dose 325mg  PO or 300mg  PR 7. Risk factor modification 8. Telemetry monitoring    Roland Rack, MD [facility] [phone]  If 7pm- 7am, please page neurology on call as listed in [place].

## 2015-08-11 NOTE — ED Notes (Signed)
Bed: WA04 Expected date:  Expected time:  Means of arrival:  Comments: Triage 8

## 2015-08-11 NOTE — ED Notes (Signed)
Attempted to in and out cath pt for urine sample, as he is incontinent of urine x2. However, unable to advance past prostate. No urine collected.

## 2015-08-11 NOTE — ED Notes (Signed)
Unable to urine to at this time.

## 2015-08-11 NOTE — H&P (Signed)
[facility] History and Physical  [name] [id number] DOB: 04/14/1925 DOA: 08/11/2015  Referring physician: Dr Tyrone Nine PCP: [name], MD   Chief Complaint:   HPI: [name] is a 81 y.o. male with past medical history significant for hypertension, hyperlipidemia, Who presents with an episode of aphasia, per wife patient was talking gibberish. Symptoms started around 9;30 ma. Symptoms resolved within 50 minutes.  Patient now back at baseline. He denies chest pain, dyspnea. He has mild productive cough.   Evaluation in the ED; CT Atrophy with patchy periventricular small vessel disease, stable. Prior lacunar type infarct in the posterior limb of the left internal capsule involving the lateral most aspect of the left thalamus, stable. No acute infarct evident. No hemorrhage or mass effect.,  alcohol less than 5 , troponin negative , normal liver function tests   Review of Systems:  Negative, except as per HPI   Past Medical History  Diagnosis Date  . Hypertension   . Hyperlipidemia   . Tobacco abuse   . Lung nodule     Left lung, seen 2012, no change in 2012, no follow up needed   . PNA (pneumonia) 2010    Bilateral Pneumonia, COPD 88mm LLL Nodule   . History of ETT 09/1991     POS   . History of MRI 04-22-06    L/S- right hnp  l5/si   . B12 deficiency   . TIA (transient ischemic attack)   . GERD (gastroesophageal reflux disease)   . Esophageal stricture   . Hemorrhoids   . Colon polyps     hyperplastic  . Pneumonia   . Macular degeneration   . Stroke [facility])    Past Surgical History  Procedure Laterality Date  . Cystectomy  1995    Lumbar area   . Hip pinning,cannulated Left 03/28/2015    Procedure: CANNULATED HIP PINNING;  Surgeon: [name], MD;  Location: [facility];  Service: Orthopedics;  Laterality: Left;   Social History:  reports that he quit smoking about 29 years ago. His smoking use included Cigarettes. He has quit using smokeless  tobacco. His smokeless tobacco use included Chew. He reports that he does not drink alcohol or use illicit drugs.  No Known Allergies  Family History  Problem Relation Age of Onset  . Hypertension Mother   . Heart attack Father   . Colon cancer Neg Hx   . Esophageal cancer Neg Hx     Prior to Admission medications   Medication Sig Start Date End Date Taking? Authorizing Provider  albuterol (PROVENTIL HFA;VENTOLIN HFA) 108 (90 BASE) MCG/ACT inhaler Inhale 2 puffs into the lungs every 6 (six) hours as needed for wheezing or shortness of breath. 07/31/15  Yes Tonia Ghent, MD  amLODipine (NORVASC) 10 MG tablet Take 1 tablet (10 mg total) by mouth daily. 07/31/15  Yes Tonia Ghent, MD  docusate sodium (COLACE) 100 MG capsule Take 1 capsule (100 mg total) by mouth 2 (two) times daily. 03/30/15  Yes Modena Jansky, MD  HYDROcodone-acetaminophen (NORCO) 5-325 MG tablet Take 1 tablet by mouth every 6 (six) hours as needed. 07/31/15  Yes Tonia Ghent, MD  lisinopril (PRINIVIL,ZESTRIL) 20 MG tablet Take 20 mg by mouth daily.   Yes Historical Provider, MD  NON FORMULARY Oxygen 2 liters as needed   Yes Historical Provider, MD  pantoprazole (PROTONIX) 40 MG tablet Take 40 mg by mouth daily.   Yes Historical Provider, MD  simvastatin (ZOCOR) 20  MG tablet Take 20 mg by mouth daily.   Yes Historical Provider, MD  tobramycin (TOBREX) 0.3 % ophthalmic solution Place 1 drop into both eyes 2 (two) times daily.  02/27/15  Yes Historical Provider, MD  aspirin EC 325 MG tablet Take 1 tablet (325 mg total) by mouth 2 (two) times daily. Take for 4 weeks 03/30/15   [name], MD  azithromycin (ZITHROMAX) 250 MG tablet 2 tabs a day for 1 day and then 1 a day for 4 days. 07/31/15   Tonia Ghent, MD  Multiple Vitamins-Minerals (PRESERVISION AREDS 2) CAPS Take 1 tablet by mouth 2 (two) times daily. Take as directed. 03/30/15   Modena Jansky, MD   Physical Exam: Filed Vitals:   08/11/15 1335 08/11/15 1521    BP: 215/85 184/65  Pulse: 65 86  Temp: 98.1 F (36.7 C)   TempSrc: Oral   Resp: 16 16  SpO2: 100% 95%    Wt Readings from Last 3 Encounters:  07/31/15 65.091 kg (143 lb 8 oz)  03/27/15 63.05 kg (139 lb)  03/25/15 63.05 kg (139 lb)    General:  Appears calm and comfortable Eyes: PERRL, normal lids, irises & conjunctiva ENT: grossly normal hearing, lips & tongue Neck: no LAD, masses or thyromegaly Cardiovascular: RRR, no m/r/g. No LE edema. Telemetry: SR, no arrhythmias  Respiratory: CTA bilaterally, no w/r/r. Normal respiratory effort. Abdomen: soft, ntnd Skin: no rash or induration seen on limited exam Musculoskeletal: grossly normal tone BUE/BLE Psychiatric: grossly normal mood and affect, speech fluent and appropriate Neurologic: grossly non-focal.          Labs on Admission:  Basic Metabolic Panel:  Recent Labs Lab 08/11/15 1410 08/11/15 1411  NA 135 134*  K 3.9 4.0  CL 95* 96*  CO2  --  30  GLUCOSE 90 92  BUN 18 18  CREATININE 1.10 1.15  CALCIUM  --  9.0   Liver Function Tests:  Recent Labs Lab 08/11/15 1411  AST 20  ALT 14*  ALKPHOS 100  BILITOT 0.8  PROT 7.2  ALBUMIN 3.8   No results for input(s): LIPASE, AMYLASE in the last 168 hours. No results for input(s): AMMONIA in the last 168 hours. CBC:  Recent Labs Lab 08/11/15 1410 08/11/15 1411  WBC  --  7.6  NEUTROABS  --  4.8  HGB 15.3 13.4  HCT 45.0 41.2  MCV  --  83.7  PLT  --  262   Cardiac Enzymes: No results for input(s): CKTOTAL, CKMB, CKMBINDEX, TROPONINI in the last 168 hours.  BNP (last 3 results)  Recent Labs  03/31/15 0750  BNP 167.4*    ProBNP (last 3 results) No results for input(s): PROBNP in the last 8760 hours.  CBG: No results for input(s): GLUCAP in the last 168 hours.  Radiological Exams on Admission: Ct Head Wo Contrast  08/11/2015  CLINICAL DATA:  Acute onset aphasia EXAM: CT HEAD WITHOUT CONTRAST TECHNIQUE: Contiguous axial images were obtained  from the base of the skull through the vertex without intravenous contrast. COMPARISON:  August 08, 2014 FINDINGS: Moderate diffuse atrophy is stable. There is no intracranial mass, hemorrhage, extra-axial fluid collection, or midline shift. There is patchy small vessel disease throughout the centra semiovale bilaterally, somewhat more severe on the right than on the left, and stable. There is a prior lacunar type infarct in the posterior limb of the left internal capsule involving the lateral most aspect of the left thalamus, stable. There is no new  gray-white compartment lesion. No acute infarct is evident. The bony calvarium appears intact. Visualized mastoid air cells are clear. No intraorbital lesions are identified. IMPRESSION: Atrophy with patchy periventricular small vessel disease, stable. Prior lacunar type infarct in the posterior limb of the left internal capsule involving the lateral most aspect of the left thalamus, stable. No acute infarct evident. No hemorrhage or mass effect. Electronically Signed   By: Lowella Grip III M.D.   On: 08/11/2015 14:51    EKG: Independently reviewed. Sinus rhythm  Assessment/Plan Active Problems:   CVA (cerebral infarction)  1-TIA;   Patient presented with dysarthric  Speech,  symptoms resolve within 50 minutes. Patient will be admitted to the telemetry for TIA stroke workup.  MRI MRA, carotid Dopplers, 2-D echo ordered  Neurology consulted. Patient  will be transferred to [facility]  to be able to be followed by the stroke team. Per hydralazine missive hypertension .  2-HTN; permissive hypertension in the setting of possible stroke. Would hold Norvasc and lisinopril.  When necessary hydralazine for systolic blood pressure more than 220 .  Code Status: DNR DVT Prophylaxis: Lovenox Family Communication: Care is discussed with family Disposition Plan:expect less than 2 days inpatient.    Time spent: 75 minutes.   Niel Hummer A [facility] Pager [phone]

## 2015-08-11 NOTE — ED Notes (Signed)
Delay explained to family 

## 2015-08-11 NOTE — ED Notes (Signed)
Unable to get urine via in and out cath

## 2015-08-12 ENCOUNTER — Observation Stay (HOSPITAL_BASED_OUTPATIENT_CLINIC_OR_DEPARTMENT_OTHER): Payer: Medicare Other

## 2015-08-12 ENCOUNTER — Observation Stay (HOSPITAL_COMMUNITY): Payer: Medicare Other

## 2015-08-12 DIAGNOSIS — I6789 Other cerebrovascular disease: Secondary | ICD-10-CM

## 2015-08-12 DIAGNOSIS — I6523 Occlusion and stenosis of bilateral carotid arteries: Secondary | ICD-10-CM | POA: Diagnosis not present

## 2015-08-12 DIAGNOSIS — F0391 Unspecified dementia with behavioral disturbance: Secondary | ICD-10-CM | POA: Diagnosis not present

## 2015-08-12 DIAGNOSIS — G459 Transient cerebral ischemic attack, unspecified: Secondary | ICD-10-CM | POA: Diagnosis not present

## 2015-08-12 LAB — RAPID URINE DRUG SCREEN, HOSP PERFORMED
AMPHETAMINES: NOT DETECTED
BARBITURATES: NOT DETECTED
Benzodiazepines: NOT DETECTED
Cocaine: NOT DETECTED
Opiates: NOT DETECTED
TETRAHYDROCANNABINOL: NOT DETECTED

## 2015-08-12 LAB — LIPID PANEL
Cholesterol: 201 mg/dL — ABNORMAL HIGH (ref 0–200)
HDL: 41 mg/dL (ref >40)
LDL CALC: 140 mg/dL — AB (ref 0–99)
TRIGLYCERIDES: 98 mg/dL (ref <150)
Total CHOL/HDL Ratio: 4.9 RATIO
VLDL: 20 mg/dL (ref 0–40)

## 2015-08-12 LAB — URINALYSIS, ROUTINE W REFLEX MICROSCOPIC
BILIRUBIN URINE: NEGATIVE
Glucose, UA: NEGATIVE mg/dL
KETONES UR: NEGATIVE mg/dL
LEUKOCYTES UA: NEGATIVE
NITRITE: NEGATIVE
PROTEIN: 30 mg/dL — AB
Specific Gravity, Urine: 1.007 (ref 1.005–1.030)
pH: 7 (ref 5.0–8.0)

## 2015-08-12 LAB — URINE MICROSCOPIC-ADD ON: WBC UA: NONE SEEN WBC/hpf (ref 0–5)

## 2015-08-12 MED ORDER — ASPIRIN EC 325 MG PO TBEC
325.0000 mg | DELAYED_RELEASE_TABLET | Freq: Every day | ORAL | Status: DC
  Administered 2015-08-12 – 2015-08-13 (×2): 325 mg via ORAL
  Filled 2015-08-12 (×2): qty 1

## 2015-08-12 MED ORDER — HALOPERIDOL LACTATE 5 MG/ML IJ SOLN
1.0000 mg | Freq: Once | INTRAMUSCULAR | Status: DC
  Filled 2015-08-12 (×2): qty 1

## 2015-08-12 MED ORDER — HALOPERIDOL LACTATE 5 MG/ML IJ SOLN
2.0000 mg | Freq: Once | INTRAMUSCULAR | Status: AC
  Administered 2015-08-12: 2 mg via INTRAMUSCULAR
  Filled 2015-08-12: qty 1

## 2015-08-12 MED ORDER — HALOPERIDOL LACTATE 5 MG/ML IJ SOLN: 1.0000 mg | Freq: Once | INTRAMUSCULAR | Status: DC

## 2015-08-12 NOTE — Progress Notes (Signed)
PROGRESS NOTE    [name] [id number] DOB: 12-29-1924 DOA: 08/11/2015 PCP: [name], MD  HPI/Brief narrative 81 year old male with history of HTN, HLD, TIA/stroke, GERD, possible underlying dementia, lives with spouse at home, presented to ED on 08/11/15 with transient (15 minutes) of aphasia at approximately 11 AM. He had abnormal/nonsensical speech which resolved spontaneously. Admitted for stroke workup. Neurology consulting. On 12/14, noted to be agitated-possibly related to dementia   Assessment/Plan:  Stroke versus TIA - Resultant aphasia/nonsensical speech-resolved - CT head: No acute infarct evident. - MRI brain: Pending - MRA brain: Pending - Carotid Dopplers: Pending - 2-D echo: LVEF 55-60 percent and grade 1 diastolic dysfunction - LDL 140 - Hemoglobin A1c: Pending - Was on aspirin 325 MG daily prior to admission, continue same - Neurology input appreciated. Discussed with Dr. Leonie Man.  Dementia with behavioral changes/agitation  - As per discussion with spouse, patient has issues with intermittent confusion and memory impairment suggestive of dementia at home. Did not sleep well last night. He apparently gets confused when he gets sick-family recounts history of confusion with hip fracture and pneumonia in the past. - Likely has dementia.  - We will use low-dose Haldol when necessary (IM since he lost IV access and not cooperative currently)  - Monitor.  -Chest x-ray without acute abnormalities. Urine microscopy: Does not suggest UTI. UDS negative.   Essential hypertension - Uncontrolled  Hyperlipidemia - LDL 140, goal <70. Continue Zocor 20 MG daily    DVT prophylaxis: Lovenox  Code Status: DNR  Family Communication: Discussed with patient's spouse at bedside  Disposition Plan: To be determined. Possibly DC home when stable.    Consultants:  Neurology  Procedures:  2 D Echo: Study Conclusions  - Procedure narrative: Transthoracic  echocardiography. Image quality was poor. The study was technically difficult, as a result of poor acoustic windows, poor sound wave transmission, and restricted patient mobility. - Left ventricle: The cavity size was normal. Wall thickness was increased in a pattern of moderate LVH. Systolic function was normal. The estimated ejection fraction was in the range of 55% to 60%. Wall motion was normal; there were no regional wall motion abnormalities. Doppler parameters are consistent with abnormal left ventricular relaxation (grade 1 diastolic dysfunction). - Aortic valve: Valve area (VTI): 1.28 cm^2. Valve area (Vmax): 1.4 cm^2. Valve area (Vmean): 1.4 cm^2. - Mitral valve: Calcified annulus.  Antibiotics:  None  Subjective: Patient denies complaints. As per spouse at bedside this morning, speech returned to normal and abnormal speech has not recurred. Subsequently in the afternoon, noted to be confused and agitated.   Objective: Filed Vitals:   08/12/15 0400 08/12/15 0600 08/12/15 1019 08/12/15 1412  BP: 179/71 188/76 188/72 159/79  Pulse: 66 60 58 85  Temp: 98.1 F (36.7 C) 97.9 F (36.6 C) 97.8 F (36.6 C) 98 F (36.7 C)  TempSrc: Oral Oral Oral Oral  Resp: 16 18 18 18   Weight:      SpO2: 92% 96% 97% 98%   No intake or output data in the 24 hours ending 08/12/15 1515 Filed Weights   08/11/15 2023  Weight: 62.2 kg (137 lb 2 oz)     Exam:  General exam: Pleasant elderly male lying comfortably in bed this morning. Undergoing bedside echocardiogram.  Respiratory system: Clear. No increased work of breathing. Cardiovascular system: S1 & S2 heard, RRR. No JVD, murmurs, gallops, clicks or pedal edema. Telemetry: Sinus rhythm.  Gastrointestinal system: Abdomen is nondistended, soft and nontender. Normal bowel  sounds heard. Central nervous system: Alert and oriented only to self . No focal neurological deficits. Extremities: Symmetric 5 x 5  power.   Data Reviewed: Basic Metabolic Panel:  Recent Labs Lab 08/11/15 1410 08/11/15 1411  NA 135 134*  K 3.9 4.0  CL 95* 96*  CO2  --  30  GLUCOSE 90 92  BUN 18 18  CREATININE 1.10 1.15  CALCIUM  --  9.0   Liver Function Tests:  Recent Labs Lab 08/11/15 1411  AST 20  ALT 14*  ALKPHOS 100  BILITOT 0.8  PROT 7.2  ALBUMIN 3.8   No results for input(s): LIPASE, AMYLASE in the last 168 hours. No results for input(s): AMMONIA in the last 168 hours. CBC:  Recent Labs Lab 08/11/15 1410 08/11/15 1411  WBC  --  7.6  NEUTROABS  --  4.8  HGB 15.3 13.4  HCT 45.0 41.2  MCV  --  83.7  PLT  --  262   Cardiac Enzymes: No results for input(s): CKTOTAL, CKMB, CKMBINDEX, TROPONINI in the last 168 hours. BNP (last 3 results) No results for input(s): PROBNP in the last 8760 hours. CBG: No results for input(s): GLUCAP in the last 168 hours.  No results found for this or any previous visit (from the past 240 hour(s)).      Studies: Dg Chest 2 View  08/11/2015  CLINICAL DATA:  Cough, history hypertension, smoking, stroke EXAM: CHEST  2 VIEW COMPARISON:  04/01/2015 Correlation: CT chest 02/09/2009 FINDINGS: Minimal enlargement of cardiac silhouette. Atherosclerotic calcification aorta. Mediastinal contours and pulmonary vascularity normal. 12 mm nodular density LEFT upper lobe unchanged since 02/09/2009, shown to be a stable sclerotic focus within the anterior LEFT third rib. Lungs emphysematous but otherwise clear. No pleural effusion or pneumothorax. Bones demineralized. IMPRESSION: COPD changes. Enlargement of cardiac silhouette. No acute abnormalities. Electronically Signed   By: Lavonia Dana M.D.   On: 08/11/2015 21:40   Ct Head Wo Contrast  08/11/2015  CLINICAL DATA:  Acute onset aphasia EXAM: CT HEAD WITHOUT CONTRAST TECHNIQUE: Contiguous axial images were obtained from the base of the skull through the vertex without intravenous contrast. COMPARISON:  August 08, 2014 FINDINGS: Moderate diffuse atrophy is stable. There is no intracranial mass, hemorrhage, extra-axial fluid collection, or midline shift. There is patchy small vessel disease throughout the centra semiovale bilaterally, somewhat more severe on the right than on the left, and stable. There is a prior lacunar type infarct in the posterior limb of the left internal capsule involving the lateral most aspect of the left thalamus, stable. There is no new gray-white compartment lesion. No acute infarct is evident. The bony calvarium appears intact. Visualized mastoid air cells are clear. No intraorbital lesions are identified. IMPRESSION: Atrophy with patchy periventricular small vessel disease, stable. Prior lacunar type infarct in the posterior limb of the left internal capsule involving the lateral most aspect of the left thalamus, stable. No acute infarct evident. No hemorrhage or mass effect. Electronically Signed   By: Lowella Grip III M.D.   On: 08/11/2015 14:51        Scheduled Meds: . aspirin EC  325 mg Oral Daily  . docusate sodium  100 mg Oral BID  . enoxaparin (LOVENOX) injection  40 mg Subcutaneous Q24H  . haloperidol lactate  2 mg Intramuscular Once  . multivitamin-lutein  1 capsule Oral Daily  . pantoprazole  40 mg Oral Daily  . simvastatin  20 mg Oral QHS  . tobramycin  1  drop Both Eyes BID   Continuous Infusions: . sodium chloride 50 mL/hr at 08/11/15 2302    Active Problems:   TIA (transient ischemic attack)   CVA (cerebral infarction)    Time spent: 40 minutes.    Vernell Leep, MD, FACP, FHM. [facility] Pager [phone]  If 7PM-7AM, please contact night-coverage [email or web address] Password [email or web address] 08/12/2015, 3:15 PM

## 2015-08-12 NOTE — Progress Notes (Signed)
Occupational Therapy Evaluation Patient Details Name: [name] MRN: [id number] DOB: 01-May-1925 Today's Date: 08/12/2015    History of Present Illness  81 y.o. male admitted for sudden onset of aphasia. PMH significant for HTN, hyperlipidemia, GERD, TIA, CVA, and macular degeneration.   Clinical Impression   PTA, pt required assistance with all ADLs from wife and used RW for all mobility. Pt currently presents with generalized weakness and cognitive deficits (disoriented and very confused). Pt required mod assist for sit-stand transfers, max assist from wife for LB dressing and pericare after incontinence. Pt became extremely agitated and violent halfway through OT evaluation and attempted to climb out of bed to ambulate to bathroom without assistance - pt refused to use BSC or allow OT/RN to assist with pericare after having bowel movement sitting EOB. Pt will benefit from acute skilled OT to increase independence and safety with ADLs and functional mobility to allow safe discharge to venue listed below. Recommending SNF for post-acute rehab if pt continues to require same level of assistance as wife is unable to provide this level of care.   Follow Up Recommendations  SNF;Supervision/Assistance - 24 hour    Equipment Recommendations  Other (comment) (TBD in next venue)    Recommendations for Other Services       Precautions / Restrictions Precautions Precautions: Fall Restrictions Weight Bearing Restrictions: No      Mobility Bed Mobility Overal bed mobility: Needs Assistance Bed Mobility: Supine to Sit     Supine to sit: Max assist     General bed mobility comments: HOB flat, use of bedrails. Attempted to have pt roll to side to sit but pt resisting and appeared confused. With severeal verbal/tactile cues, pt progressed to EOB with mod assist for trunk support.  Transfers Overall transfer level: Needs assistance Equipment used: Rolling walker (2  wheeled) Transfers: Sit to/from Stand Sit to Stand: Mod assist         General transfer comment: x3 sit-stand and mod assist for boost to stand. Verbal/tactile cues for safe hand placement on seated surface and not to pull up on RW.     Balance Overall balance assessment: Needs assistance Sitting-balance support: No upper extremity supported;Feet supported Sitting balance-Leahy Scale: Fair Sitting balance - Comments: Able to sit with min guard assist for 5 minutes. Slight posterior lean improved with cues. Postural control: Posterior lean Standing balance support: Bilateral upper extremity supported;During functional activity Standing balance-Leahy Scale: Poor                              ADL Overall ADL's : Needs assistance/impaired     Grooming: Wash/dry face;Set up;Supervision/safety;Sitting Grooming Details (indicate cue type and reason): Verbal cues to initiate task             Lower Body Dressing: Maximal assistance;With caregiver independent assisting;Cueing for safety;Sit to/from stand Lower Body Dressing Details (indicate cue type and reason): Verbal cues to orient pt as to why depends needed to be changed.               General ADL Comments: Pt incontinent during session - required max assist to change and complete pericare. Halfway through session, pt became increasingly confused and agitated. OT asked family to leave room so  pt could calm down and lay back down as transport arrived to take pt to MRI. Pt refused to lay down and suddenly needed to have bowel movement. Pt refused to stand to  use BSC and attempted to climb out of bed to ambulate into bathroom without AD. Pt required mod assist for sit-stand transfer and is a high fall risk and would not have been able to ambulate to bathroom. Pt yelling for wife who had left for cafeteria. Pt refused physical assist from OT and RN to transfer to bathroom and voided in depends and refused to allow RN or CNA  to assist him with pericare. Pt became increasingly agitated, threatened and attempted to hit OT, RN, and CNA. Pt refused to lay back in bed or complete pericare. OT got pt's son to assist - pt continued to refuse to move from sitting EOB. OT/RN agreed it was best to leave pt sitting EOB with son present and RN outside door to let pt calm down and wait for wife to return.     Vision Vision Assessment?: Vision impaired- to be further tested in functional context Additional Comments: Unable to complete visual screening - pt unable to follow commands consistently   Perception     Praxis      Pertinent Vitals/Pain Pain Assessment: 0-10 Pain Score: 5  Pain Location: R arm at IV site Pain Descriptors / Indicators: Discomfort;Grimacing Pain Intervention(s): Limited activity within patient's tolerance;Monitored during session;Repositioned     Hand Dominance Right   Extremity/Trunk Assessment Upper Extremity Assessment Upper Extremity Assessment: Overall WFL for tasks assessed   Lower Extremity Assessment Lower Extremity Assessment: Defer to PT evaluation   Cervical / Trunk Assessment Cervical / Trunk Assessment: Kyphotic   Communication Communication Communication: Expressive difficulties   Cognition Arousal/Alertness: Awake/alert Behavior During Therapy: Agitated;Impulsive Overall Cognitive Status: Impaired/Different from baseline Area of Impairment: Orientation;Attention;Memory;Following commands;Safety/judgement;Awareness;Problem solving Orientation Level: Disoriented to;Person;Place;Time;Situation Current Attention Level: Focused Memory: Decreased short-term memory Following Commands: Follows one step commands inconsistently;Follows one step commands with increased time Safety/Judgement: Decreased awareness of safety;Decreased awareness of deficits Awareness: Intellectual Problem Solving: Slow processing;Decreased initiation;Difficulty sequencing;Requires verbal cues;Requires  tactile cues General Comments: Pt very confused and poor historian - no history of dementia in chart, but son stated that the MD told him something about pt having Alzheimer's. Pt became extremely agitated and violent at end of session.    General Comments       Exercises       Shoulder Instructions      Home Living Family/patient expects to be discharged to:: Private residence Living Arrangements: Spouse/significant other Available Help at Discharge: Family;Available 24 hours/day Type of Home: House       Home Layout: One level     Bathroom Shower/Tub: Teacher, early years/pre: Handicapped height     Home Equipment: Environmental consultant - 2 wheels;Cane - single point;Bedside commode;Shower seat;Grab bars - tub/shower;Hand held shower head;Wheelchair - manual   Additional Comments: Pt poor historian. Wife provided home information.       Prior Functioning/Environment Level of Independence: Needs assistance  Gait / Transfers Assistance Needed: RW at all times and wife assists for boost to stand and for balance ADL's / Homemaking Assistance Needed: Wife assists with all ADLs and community mobility   Comments: Wife reports that pt is very conufsed and not acting like himself at all. Pt is typically quiet and reserved.     OT Diagnosis: Cognitive deficits;Altered mental status;Generalized weakness;Disturbance of vision   OT Problem List: Decreased strength;Decreased activity tolerance;Impaired balance (sitting and/or standing);Decreased coordination;Decreased safety awareness;Decreased cognition;Decreased knowledge of use of DME or AE;Impaired vision/perception   OT Treatment/Interventions: Self-care/ADL training;Therapeutic exercise;DME and/or AE instruction;Therapeutic activities;Visual/perceptual remediation/compensation;Patient/family  education;Balance training    OT Goals(Current goals can be found in the care plan section) Acute Rehab OT Goals Patient Stated Goal: unable  to state. Wife's goal is to get pt home OT Goal Formulation: With family Time For Goal Achievement: 08/26/15 Potential to Achieve Goals: Good ADL Goals Pt Will Perform Grooming: with min assist;with caregiver independent in assisting;standing Pt Will Perform Upper Body Bathing: with min assist;with caregiver independent in assisting;sitting Pt Will Perform Lower Body Bathing: with min assist;with caregiver independent in assisting;sitting/lateral leans;sit to/from stand Pt Will Perform Upper Body Dressing: with min assist;with caregiver independent in assisting;sitting Pt Will Perform Lower Body Dressing: with min assist;with caregiver independent in assisting;sitting/lateral leans;sit to/from stand Pt Will Transfer to Toilet: with min assist;ambulating;bedside commode Pt Will Perform Toileting - Clothing Manipulation and hygiene: with min assist;with caregiver independent in assisting;sitting/lateral leans;sit to/from stand Pt Will Perform Tub/Shower Transfer: Tub transfer;with min assist;with caregiver independent in assisting;ambulating;shower seat;rolling walker;grab bars  OT Frequency: Min 2X/week   Barriers to D/C:            Co-evaluation              End of Session Equipment Utilized During Treatment: Gait belt;Rolling walker Nurse Communication: Mobility status;Other (comment) (Pt is a high fall risk; severe agitation)  Activity Tolerance: Treatment limited secondary to agitation Patient left: in bed;with nursing/sitter in room;with family/visitor present   Time: [id number] OT Time Calculation (min): 69 min Charges:  OT General Charges $OT Visit: 1 Procedure OT Evaluation $Initial OT Evaluation Tier I: 1 Procedure OT Treatments $Self Care/Home Management : 53-67 mins G-Codes:    Redmond Baseman, OTR/L 08/27/15, 1:39 PM

## 2015-08-12 NOTE — Progress Notes (Signed)
PT Cancellation Note  Patient Details Name: [name] MRN: [id number] DOB: August 09, 1925   Cancelled Treatment:    Reason Eval/Treat Not Completed: Patient at procedure or test/unavailable (on 2 attempts this a.m.)    Caeli Linehan 08/12/2015, 9:27 AM  Pager [phone]

## 2015-08-12 NOTE — Progress Notes (Signed)
PT Cancellation Note  Patient Details Name: [name] MRN: [id number] DOB: 1924-09-29   Cancelled Treatment:    Reason Eval/Treat Not Completed: Patient at procedure or test/unavailable. Working with OT   Yocelyn Brocious 08/12/2015, 11:42 AM

## 2015-08-12 NOTE — Progress Notes (Signed)
  Echocardiogram 2D Echocardiogram has been performed.  [name] 08/12/2015, 10:59 AM

## 2015-08-12 NOTE — Care Management Note (Signed)
Case Management Note  Patient Details  Name: MACKLIN EINSTEIN MRN: [id number] Date of Birth: 1925/02/04  Subjective/Objective:   Patient admitted to r/o CVA. Patient is from home with his spouse.                  Action/Plan:  Awaiting PT/OT recommendations. CM will continue to follow for discharge needs.    Expected Discharge Date:   (unknown)               Expected Discharge Plan:     In-House Referral:     Discharge planning Services     Post Acute Care Choice:    Choice offered to:     DME Arranged:    DME Agency:     HH Arranged:    HH Agency:     Status of Service:  In process, will continue to follow  Medicare Important Message Given:    Date Medicare IM Given:    Medicare IM give by:    Date Additional Medicare IM Given:    Additional Medicare Important Message give by:     If discussed at [place] of Stay Meetings, dates discussed:    Additional Comments:  Pollie Friar, RN 08/12/2015, 3:31 PM

## 2015-08-12 NOTE — Progress Notes (Signed)
STROKE TEAM PROGRESS NOTE   HISTORY [name] is a 81 y.o. male with a history of hypertension, hyperlipidemia who presents with transient aphasia. His wife is with him and states that he suddenly stopped making sense. There were no words, rather "gobeldy-gook." This lasted for approximately 15 minutes after which the patient's speech returned normal. He has full memory of the episode and states that he was unable to understand anything that anybody was saying. He denies numbness, weakness, changes in consciousness, lightheadedness. Of note the patient ran out of his aspirin one week ago, and was recently taken off some of his blood pressure medicine now has a systolic of [id number]. He was LKW 08/11/2015 at 11:30 AM. Patient was not administered TPA secondary to resolution of symptoms. He was admitted for further evaluation and treatment.   SUBJECTIVE (INTERVAL HISTORY) His famly is at the bedside.  Overall they feel his condition is confused. Wife recounted history that led to admission. She states he was talking gibberish.   OBJECTIVE Temp:  [97.8 F (36.6 C)-98.4 F (36.9 C)] 97.8 F (36.6 C) (12/14 1019) Pulse Rate:  [58-86] 58 (12/14 1019) Cardiac Rhythm:  [-] Normal sinus rhythm (12/14 0700) Resp:  [15-20] 18 (12/14 1019) BP: (148-215)/(59-87) 188/72 mmHg (12/14 1019) SpO2:  [92 %-100 %] 97 % (12/14 1019) Weight:  [62.2 kg (137 lb 2 oz)] 62.2 kg (137 lb 2 oz) (12/13 2023)  CBC:   Recent Labs Lab 08/11/15 1410 08/11/15 1411  WBC  --  7.6  NEUTROABS  --  4.8  HGB 15.3 13.4  HCT 45.0 41.2  MCV  --  83.7  PLT  --  99991111    Basic Metabolic Panel:   Recent Labs Lab 08/11/15 1410 08/11/15 1411  NA 135 134*  K 3.9 4.0  CL 95* 96*  CO2  --  30  GLUCOSE 90 92  BUN 18 18  CREATININE 1.10 1.15  CALCIUM  --  9.0    Lipid Panel:     Component Value Date/Time   CHOL 201* 08/12/2015 0539   TRIG 98 08/12/2015 0539   HDL 41 08/12/2015 0539   CHOLHDL 4.9 08/12/2015 0539    VLDL 20 08/12/2015 0539   LDLCALC 140* 08/12/2015 0539   HgbA1c:  Lab Results  Component Value Date   HGBA1C 6.2* 03/16/2011   Urine Drug Screen:     Component Value Date/Time   LABOPIA NONE DETECTED 08/12/2015 0241   COCAINSCRNUR NONE DETECTED 08/12/2015 0241   LABBENZ NONE DETECTED 08/12/2015 0241   AMPHETMU NONE DETECTED 08/12/2015 0241   THCU NONE DETECTED 08/12/2015 0241   LABBARB NONE DETECTED 08/12/2015 0241      IMAGING  Dg Chest 2 View  08/11/2015  CLINICAL DATA:  Cough, history hypertension, smoking, stroke EXAM: CHEST  2 VIEW COMPARISON:  04/01/2015 Correlation: CT chest 02/09/2009 FINDINGS: Minimal enlargement of cardiac silhouette. Atherosclerotic calcification aorta. Mediastinal contours and pulmonary vascularity normal. 12 mm nodular density LEFT upper lobe unchanged since 02/09/2009, shown to be a stable sclerotic focus within the anterior LEFT third rib. Lungs emphysematous but otherwise clear. No pleural effusion or pneumothorax. Bones demineralized. IMPRESSION: COPD changes. Enlargement of cardiac silhouette. No acute abnormalities. Electronically Signed   By: Lavonia Dana M.D.   On: 08/11/2015 21:40   Ct Head Wo Contrast  08/11/2015  CLINICAL DATA:  Acute onset aphasia EXAM: CT HEAD WITHOUT CONTRAST TECHNIQUE: Contiguous axial images were obtained from the base of the skull through the vertex without intravenous  contrast. COMPARISON:  August 08, 2014 FINDINGS: Moderate diffuse atrophy is stable. There is no intracranial mass, hemorrhage, extra-axial fluid collection, or midline shift. There is patchy small vessel disease throughout the centra semiovale bilaterally, somewhat more severe on the right than on the left, and stable. There is a prior lacunar type infarct in the posterior limb of the left internal capsule involving the lateral most aspect of the left thalamus, stable. There is no new gray-white compartment lesion. No acute infarct is evident. The bony  calvarium appears intact. Visualized mastoid air cells are clear. No intraorbital lesions are identified. IMPRESSION: Atrophy with patchy periventricular small vessel disease, stable. Prior lacunar type infarct in the posterior limb of the left internal capsule involving the lateral most aspect of the left thalamus, stable. No acute infarct evident. No hemorrhage or mass effect. Electronically Signed   By: Lowella Grip III M.D.   On: 08/11/2015 14:51   2D Echocardiogram  - Procedure narrative: Transthoracic echocardiography. Imagequality was poor. The study was technically difficult, as aresult of poor acoustic windows, poor sound wave transmission,and restricted patient mobility. - Left ventricle: The cavity size was normal. Wall thickness wasincreased in a pattern of moderate LVH. Systolic function wasnormal. The estimated ejection fraction was in the range of 55%to 60%. Wall motion was normal; there were no regional wallmotion abnormalities. Doppler parameters are consistent withabnormal left ventricular relaxation (grade 1 diastolicdysfunction). - Aortic valve: Valve area (VTI): 1.28 cm^2. Valve area (Vmax): 1.4cm^2. Valve area (Vmean): 1.4 cm^2. - Mitral valve: Calcified annulus.   PHYSICAL EXAM Frail elderly Caucasian male not in distress. . Afebrile. Head is nontraumatic. Neck is supple without bruit.    Cardiac exam no murmur or gallop. Lungs are clear to auscultation. Distal pulses are well felt. Neurological Exam ;  Awake  Alert oriented x 2. Diminished attention, registration and recall.. Normal speech and language.eye movements full without nystagmus.fundi were not visualized. Vision acuity and fields appear normal. Hearing is normal. Palatal movements are normal. Face symmetric. Tongue midline. Normal strength, tone, reflexes and coordination. Normal sensation. Gait deferred. ASSESSMENT/PLAN Mr. [name] is a 81 y.o. male with history of HTN and  HLD presenting with  transient aphasia. He did not receive IV t-PA due to resolution of symptoms.   Stroke/TIA vs Seizure  Resultant  Confusion likely mild baseline dementia/cognitive impairment with sundowning  MRI  pending   MRA  pending   Carotid Doppler  pending   2D Echo  No source of embolus   LDL 140  HgbA1c pending  Lovenox 40 mg sq daily for VTE prophylaxis Diet Heart Room service appropriate?: Yes; Fluid consistency:: Thin  aspirin 325 mg daily prior to admission, now on No antithrombotic. Resume aspirin. Ordered.  Ongoing aggressive stroke risk factor management  Therapy recommendations:  pending  Disposition:  pending   Hypertension  elevated  Hyperlipidemia  Home meds:  zocor 20,  resumed in hospital  LDL 140, goal < 70  Continue statin at discharge  Other Stroke Risk Factors  Advanced age  Former Cigarette smoker, quit smoking 29 years ago  Oral tobacco use (chew)  Hx stroke/TIA -   Other Active Problems  Patient gets confused when he gets sick (family recounts hx of confusion with fx hip, PNA, etc)  Hospital day #   Truxton Vincent for Pager information 08/12/2015 1:20 PM  I have personally examined this patient, reviewed notes, independently viewed imaging studies, participated in medical decision making  and plan of care. I have made any additions or clarifications directly to the above note. Agree with note above. He presented with transient speech difficulties likely left hemispheric TIA. He remains at risk for neurological worsening, recurrent stroke, TIA needs ongoing stroke evaluation and aggressive risk factor modification. I had a long discussion with his family at the bedside and answered questions. I suspect he has underlying mild dementia and cognitive impairment at baseline given his confusion with multiple  Recent hospitalizations in the past  Antony Contras, [place] Pager:  [phone] 08/12/2015 7:09 PM    To contact Stroke Continuity provider, please refer to [email or web address] After hours, contact General Neurology

## 2015-08-12 NOTE — Progress Notes (Signed)
*  PRELIMINARY RESULTS* Vascular Ultrasound Carotid Duplex (Doppler) has been completed.  Findings suggest 1-39% internal carotid artery stenosis bilaterally. Vertebral arteries are patent with antegrade flow.  08/12/2015 5:20 PM Maudry Mayhew, RVT, RDCS, RDMS

## 2015-08-12 NOTE — Progress Notes (Signed)
PT Cancellation Note  Patient Details Name: [name] MRN: [id number] DOB: Jul 22, 1925   Cancelled Treatment:    Reason Eval/Treat Not Completed: Patient at procedure or test/unavailable (Pt in Echo)   [name] F 08/12/2015, 1:20 PM [facility] [phone] (pager)

## 2015-08-12 NOTE — Progress Notes (Signed)
PT Cancellation Note  Patient Details Name: [name] MRN: [id number] DOB: 1925-01-23   Cancelled Treatment:    Reason Eval/Treat Not Completed: Patient at procedure or test/unavailable.  Pt at MRI.  PT will continue to follow.  Joslyn Hy PT, DPT [phone] Pager: [phone] 08/12/2015, 3:44 PM

## 2015-08-13 ENCOUNTER — Observation Stay (HOSPITAL_COMMUNITY)
Admit: 2015-08-13 | Discharge: 2015-08-13 | Disposition: A | Discharge status: Home / Self Care | Payer: Medicare Other | Attending: Internal Medicine | Admitting: Internal Medicine

## 2015-08-13 DIAGNOSIS — G459 Transient cerebral ischemic attack, unspecified: Secondary | ICD-10-CM | POA: Diagnosis not present

## 2015-08-13 DIAGNOSIS — F0391 Unspecified dementia with behavioral disturbance: Secondary | ICD-10-CM | POA: Diagnosis not present

## 2015-08-13 LAB — HEMOGLOBIN A1C
HEMOGLOBIN A1C: 5.6 % (ref 4.8–5.6)
MEAN PLASMA GLUCOSE: 114 mg/dL

## 2015-08-13 MED ORDER — ASPIRIN EC 325 MG PO TBEC: 325.0000 mg | DELAYED_RELEASE_TABLET | Freq: Every day | ORAL | Status: DC

## 2015-08-13 NOTE — Progress Notes (Addendum)
Patient is improving his level of consciousness is better and his ability to ambulate has improved will continue to monitor, additionally  He is following commands and is not being combative. Patient does seem to have some issues with swallow I will notify SLP.

## 2015-08-13 NOTE — Progress Notes (Signed)
SLP Cancellation Note  Patient Details Name: [name] MRN: [id number] DOB: 1925/05/19   Cancelled treatment:       Reason Eval/Treat Not Completed: Patient at procedure or test/unavailable. Upon SLP arrival, pt soundly sleeping and about to be prepped for EEG. Will return as able.   Germain Osgood, M.A. CCC-SLP [phone]  Germain Osgood 08/13/2015, 1:46 PM

## 2015-08-13 NOTE — Care Management Obs Status (Signed)
[place] NOTIFICATION   Patient Details  Name: [name] MRN: [id number] Date of Birth: [birth year]   Medicare Observation Status Notification Given:  Yes    Pollie Friar, RN 08/13/2015, 11:22 AM

## 2015-08-13 NOTE — Discharge Summary (Signed)
Physician Discharge Summary  [name] [id number] DOB: 04/21/1925 DOA: 08/11/2015  PCP: [name], MD  Admit date: 08/11/2015 Discharge date: 08/13/2015  Time spent: Greater than 30 minutes  Recommendations for Outpatient Follow-up:  1. Dr. [name], PCP in 1 week.  [facility], in 8 weeks 3. Home health PT and OT.  Discharge Diagnoses:  Active Problems:   TIA (transient ischemic attack)   CVA (cerebral infarction)   Discharge Condition: Improved & Stable  Diet recommendation: Dysphagia 3 diet and thin liquids.  Filed Weights   08/11/15 2023  Weight: 62.2 kg (137 lb 2 oz)    History of present illness:  81 year old male with history of HTN, HLD, TIA/stroke, GERD, possible underlying dementia, lives with spouse at home, presented to ED on 08/11/15 with transient (15 minutes) of aphasia at approximately 11 AM. He had abnormal/nonsensical speech which resolved spontaneously. Admitted for stroke workup. Neurology consulted. On 12/14, noted to be agitated-possibly related to dementia. Agitation resolved. Mental status back to baseline. Stroke service has cleared for discharge home.   Hospital Course:   TIA - Resultant aphasia/nonsensical speech-resolved - CT head: No acute infarct evident. - MRI brain: Severely motion degraded study. No acute intracranial abnormality - MRA brain: Signal loss in the cavernous internal carotid arteries bilaterally likely reflects mild atherosclerotic changes. Stenosis is exaggerated by patient motion. Evaluation of medium in distal small vessels could not be performed due to severe motion degradation by patient. - Carotid Dopplers: 1-39 percent ICA stenosis bilaterally. Vertebral arteries are patent with antegrade flow. - 2-D echo: LVEF 55-60 percent and grade 1 diastolic dysfunction - LDL 140 - Hemoglobin A1c: 5.6. - Was on aspirin 325 MG daily prior to admission, continue same - Neurology input  appreciated. Discussed with Dr. Leonie Man >he requested EEG which shows mild encephalopathic changes but no seizure-like activity. - As per PT evaluation, home health PT. Discussed with spouse and son who believe that he is best managed at home due to dementia and risk for agitation and behavioral abnormalities at SNF. Spouse able to provide 24/7 supervision and assistance. - Dysphagia 3 diet per ST.  Dementia with behavioral changes/agitation  - As per discussion with spouse, patient has issues with intermittent confusion and memory impairment suggestive of dementia at home. Did not sleep well last night. He apparently gets confused when he gets sick-family recounts history of confusion with hip fracture and pneumonia in the past. - Likely has dementia.  -Chest x-ray without acute abnormalities. Urine microscopy: Does not suggest UTI. UDS negative.  - Received a dose of IM Haldol on 12/14. No further agitation. Quite calm and cooperative this morning. As per son at bedside, mental status is back to baseline.  Essential hypertension - Uncontrolled. Resume amlodipine at discharge.  Hyperlipidemia - LDL 140, goal <70. Continue Zocor 20 MG daily    Discussed at length with patient's son at bedside this morning and with spouse via phone this afternoon.   Consultants:  Neurology  Procedures:  2 D Echo: Study Conclusions  - Procedure narrative: Transthoracic echocardiography. Image quality was poor. The study was technically difficult, as a result of poor acoustic windows, poor sound wave transmission, and restricted patient mobility. - Left ventricle: The cavity size was normal. Wall thickness was increased in a pattern of moderate LVH. Systolic function was normal. The estimated ejection fraction was in the range of 55% to 60%. Wall motion was normal; there were no regional wall motion abnormalities. Doppler parameters are consistent  with abnormal left ventricular  relaxation (grade 1 diastolic dysfunction). - Aortic valve: Valve area (VTI): 1.28 cm^2. Valve area (Vmax): 1.4 cm^2. Valve area (Vmean): 1.4 cm^2. - Mitral valve: Calcified annulus.  EEG: Report pending.  Antibiotics:  None   Discharge Exam:  Complaints: Denies complaints. As per son at bedside, mental status back to baseline. No agitation.  Filed Vitals:   08/13/15 0118 08/13/15 0658 08/13/15 1042 08/13/15 1450  BP: 179/23 169/63 152/63 143/56  Pulse: 72 73 70 67  Temp: 97.9 F (36.6 C) 98 F (36.7 C) 98 F (36.7 C) 98.1 F (36.7 C)  TempSrc: Oral Oral Oral Oral  Resp: 20 20 18 16   Weight:      SpO2: 95% 96% 95% 96%    General exam: Pleasant elderly male lying comfortably in bed this morning.   Respiratory system: Clear. No increased work of breathing. Cardiovascular system: S1 & S2 heard, RRR. No JVD, murmurs, gallops, clicks or pedal edema. Telemetry: Sinus rhythm-sinus bradycardia in the 50s.  Gastrointestinal system: Abdomen is nondistended, soft and nontender. Normal bowel sounds heard. Central nervous system: Alert and oriented person, place and partly to time . No focal neurological deficits. Extremities: Symmetric 5 x 5 power.  Discharge Instructions      Discharge Instructions    Ambulatory referral to Neurology    Complete by:  As directed   An appointment is requested in approximately: 8 weeks     Call MD for:    Complete by:  As directed   Speech difficulties or strokelike symptoms.     Discharge instructions    Complete by:  As directed   DIET: Dysphagia 3 diet and thin liquids.  DIET: Dysphagia 3 diet and thin liquids.     Increase activity slowly    Complete by:  As directed             Medication List    TAKE these medications        albuterol 108 (90 BASE) MCG/ACT inhaler  Commonly known as:  PROVENTIL HFA;VENTOLIN HFA  Inhale 2 puffs into the lungs every 6 (six) hours as needed for wheezing or shortness of breath.      amLODipine 10 MG tablet  Commonly known as:  NORVASC  Take 1 tablet (10 mg total) by mouth daily.     aspirin EC 325 MG tablet  Take 1 tablet (325 mg total) by mouth daily.     docusate sodium 100 MG capsule  Commonly known as:  COLACE  Take 1 capsule (100 mg total) by mouth 2 (two) times daily.     HYDROcodone-acetaminophen 5-325 MG tablet  Commonly known as:  NORCO  Take 1 tablet by mouth every 6 (six) hours as needed.     NON FORMULARY  Oxygen 2 liters as needed     pantoprazole 40 MG tablet  Commonly known as:  PROTONIX  Take 40 mg by mouth daily.     PRESERVISION AREDS 2 Caps  Take 1 tablet by mouth 2 (two) times daily. Take as directed.     simvastatin 20 MG tablet  Commonly known as:  ZOCOR  Take 20 mg by mouth daily.     tobramycin 0.3 % ophthalmic solution  Commonly known as:  TOBREX  Place 1 drop into both eyes 2 (two) times daily.       Follow-up Information    Follow up with [name], MD. Schedule an appointment as soon as possible for a visit in  1 week.   Specialty:  Family Medicine   Contact information:   [address] [place] [address] [phone]        The results of significant diagnostics from this hospitalization (including imaging, microbiology, ancillary and laboratory) are listed below for reference.    Significant Diagnostic Studies: Dg Chest 2 View  08/11/2015  CLINICAL DATA:  Cough, history hypertension, smoking, stroke EXAM: CHEST  2 VIEW COMPARISON:  04/01/2015 Correlation: CT chest 02/09/2009 FINDINGS: Minimal enlargement of cardiac silhouette. Atherosclerotic calcification aorta. Mediastinal contours and pulmonary vascularity normal. 12 mm nodular density LEFT upper lobe unchanged since 02/09/2009, shown to be a stable sclerotic focus within the anterior LEFT third rib. Lungs emphysematous but otherwise clear. No pleural effusion or pneumothorax. Bones demineralized. IMPRESSION: COPD changes. Enlargement of cardiac  silhouette. No acute abnormalities. Electronically Signed   By: Lavonia Dana M.D.   On: 08/11/2015 21:40   Ct Head Wo Contrast  08/11/2015  CLINICAL DATA:  Acute onset aphasia EXAM: CT HEAD WITHOUT CONTRAST TECHNIQUE: Contiguous axial images were obtained from the base of the skull through the vertex without intravenous contrast. COMPARISON:  August 08, 2014 FINDINGS: Moderate diffuse atrophy is stable. There is no intracranial mass, hemorrhage, extra-axial fluid collection, or midline shift. There is patchy small vessel disease throughout the centra semiovale bilaterally, somewhat more severe on the right than on the left, and stable. There is a prior lacunar type infarct in the posterior limb of the left internal capsule involving the lateral most aspect of the left thalamus, stable. There is no new gray-white compartment lesion. No acute infarct is evident. The bony calvarium appears intact. Visualized mastoid air cells are clear. No intraorbital lesions are identified. IMPRESSION: Atrophy with patchy periventricular small vessel disease, stable. Prior lacunar type infarct in the posterior limb of the left internal capsule involving the lateral most aspect of the left thalamus, stable. No acute infarct evident. No hemorrhage or mass effect. Electronically Signed   By: Lowella Grip III M.D.   On: 08/11/2015 14:51   Mr Brain Wo Contrast  08/12/2015  CLINICAL DATA:  Episode of slurred speech lasting 15 minutes earlier today. No other acute symptoms. The patient has returned to baseline. EXAM: MRI HEAD WITHOUT CONTRAST MRA HEAD WITHOUT CONTRAST TECHNIQUE: Multiplanar, multiecho pulse sequences of the brain and surrounding structures were obtained without intravenous contrast. Angiographic images of the head were obtained using MRA technique without contrast. COMPARISON:  CT head without contrast 08/11/2015 and 03/15/2011 FINDINGS: MRI HEAD FINDINGS The diffusion-weighted images demonstrate no evidence  for acute or subacute infarction. The study is mildly degraded by patient motion. Moderate atrophy and white matter disease is similar to the prior exam. Remote lacunar infarcts are noted in the white matter adjacent to the atrium of the lateral ventricles. Remote lacunar infarcts are noted in the basal ganglia bilaterally. No acute hemorrhage or mass lesion is present. Flow is present in the major intracranial arteries. Globes and orbits are intact. The paranasal sinuses and mastoid air cells are clear. MRA HEAD FINDINGS The study is severely degraded by patient motion. This significantly limits evaluation of medium and small vessels. Signal loss is present in the cavernous internal carotid arteries bilaterally. The internal carotid arteries are otherwise within normal limits from the high cervical segments through the ICA termini. The A1 and M1 segments are normal. There is significant loss of signal the on the MCA bifurcations bilaterally. The vertebral arteries are codominant. The PICA vessels are not seen.  The basilar artery is normal. Both posterior cerebral arteries originate from the basilar tip. There is significant attenuation of PCA branch vessels. IMPRESSION: 1. Both the MRI brain and MRA head are severely degraded by patient motion. 2. No acute intracranial abnormality. 3. Stable atrophy and diffuse white matter disease. 4. Signal loss in the cavernous internal carotid arteries bilaterally likely reflects mild atherosclerotic changes. Stenosis is exaggerated by patient motion. 5. Evaluation of medium and distal small vessels cannot be performed due to the severe motion degradation on the MRA. Electronically Signed   By: San Morelle M.D.   On: 08/12/2015 17:05   Mr Jodene Nam Head/brain Wo Cm  08/12/2015  CLINICAL DATA:  Episode of slurred speech lasting 15 minutes earlier today. No other acute symptoms. The patient has returned to baseline. EXAM: MRI HEAD WITHOUT CONTRAST MRA HEAD WITHOUT CONTRAST  TECHNIQUE: Multiplanar, multiecho pulse sequences of the brain and surrounding structures were obtained without intravenous contrast. Angiographic images of the head were obtained using MRA technique without contrast. COMPARISON:  CT head without contrast 08/11/2015 and 03/15/2011 FINDINGS: MRI HEAD FINDINGS The diffusion-weighted images demonstrate no evidence for acute or subacute infarction. The study is mildly degraded by patient motion. Moderate atrophy and white matter disease is similar to the prior exam. Remote lacunar infarcts are noted in the white matter adjacent to the atrium of the lateral ventricles. Remote lacunar infarcts are noted in the basal ganglia bilaterally. No acute hemorrhage or mass lesion is present. Flow is present in the major intracranial arteries. Globes and orbits are intact. The paranasal sinuses and mastoid air cells are clear. MRA HEAD FINDINGS The study is severely degraded by patient motion. This significantly limits evaluation of medium and small vessels. Signal loss is present in the cavernous internal carotid arteries bilaterally. The internal carotid arteries are otherwise within normal limits from the high cervical segments through the ICA termini. The A1 and M1 segments are normal. There is significant loss of signal the on the MCA bifurcations bilaterally. The vertebral arteries are codominant. The PICA vessels are not seen. The basilar artery is normal. Both posterior cerebral arteries originate from the basilar tip. There is significant attenuation of PCA branch vessels. IMPRESSION: 1. Both the MRI brain and MRA head are severely degraded by patient motion. 2. No acute intracranial abnormality. 3. Stable atrophy and diffuse white matter disease. 4. Signal loss in the cavernous internal carotid arteries bilaterally likely reflects mild atherosclerotic changes. Stenosis is exaggerated by patient motion. 5. Evaluation of medium and distal small vessels cannot be performed  due to the severe motion degradation on the MRA. Electronically Signed   By: San Morelle M.D.   On: 08/12/2015 17:05    Microbiology: No results found for this or any previous visit (from the past 240 hour(s)).   Labs: Basic Metabolic Panel:  Recent Labs Lab 08/11/15 1410 08/11/15 1411  NA 135 134*  K 3.9 4.0  CL 95* 96*  CO2  --  30  GLUCOSE 90 92  BUN 18 18  CREATININE 1.10 1.15  CALCIUM  --  9.0   Liver Function Tests:  Recent Labs Lab 08/11/15 1411  AST 20  ALT 14*  ALKPHOS 100  BILITOT 0.8  PROT 7.2  ALBUMIN 3.8   No results for input(s): LIPASE, AMYLASE in the last 168 hours. No results for input(s): AMMONIA in the last 168 hours. CBC:  Recent Labs Lab 08/11/15 1410 08/11/15 1411  WBC  --  7.6  NEUTROABS  --  4.8  HGB 15.3 13.4  HCT 45.0 41.2  MCV  --  83.7  PLT  --  262   Cardiac Enzymes: No results for input(s): CKTOTAL, CKMB, CKMBINDEX, TROPONINI in the last 168 hours. BNP: BNP (last 3 results)  Recent Labs  03/31/15 0750  BNP 167.4*    ProBNP (last 3 results) No results for input(s): PROBNP in the last 8760 hours.  CBG: No results for input(s): GLUCAP in the last 168 hours.    Signed:  Vernell Leep, MD, FACP, FHM. [facility] Pager [phone]  If 7PM-7AM, please contact night-coverage [email or web address] Password [email or web address] 08/13/2015, 5:07 PM

## 2015-08-13 NOTE — Evaluation (Signed)
Physical Therapy Evaluation Patient Details Name: [name] MRN: [id number] DOB: Dec 23, 1924 Today's Date: 08/13/2015   History of Present Illness  81 y.o. male admitted for sudden onset of aphasia. PMH significant for HTN, hyperlipidemia, GERD, TIA, CVA, and macular degeneration.  Clinical Impression  Patient presents with decreased mobility due to deficits listed in PT problem list below.  He will benefit from skilled PT in the acute setting to allow return home with wife assist and HHPT.    Follow Up Recommendations Home health PT;Supervision/Assistance - 24 hour    Equipment Recommendations  None recommended by PT    Recommendations for Other Services       Precautions / Restrictions Precautions Precautions: Fall      Mobility  Bed Mobility   Bed Mobility: Supine to Sit     Supine to sit: HOB elevated;Supervision     General bed mobility comments: assist to remove covers, but pt able to pull up to sitting unaided  Transfers Overall transfer level: Needs assistance Equipment used: Rolling walker (2 wheeled) Transfers: Sit to/from Stand Sit to Stand: Min assist         General transfer comment: increased time and two verbal cues and manual assist to reach back for chair for stand to sit both to toilet and recliner  Ambulation/Gait Ambulation/Gait assistance: Min assist Ambulation Distance (Feet): 112 Feet Assistive device: Rolling walker (2 wheeled) Gait Pattern/deviations: Step-through pattern;Decreased stride length;Narrow base of support;Shuffle;Scissoring;Leaning posteriorly     General Gait Details: assist for anterior weight shift, for safety with turns, keeps feet close together and occasionally hitting L on R heel  Stairs            Wheelchair Mobility    Modified Rankin (Stroke Patients Only)       Balance Overall balance assessment: Needs assistance   Sitting balance-Leahy Scale: Good     Standing balance support:  Bilateral upper extremity supported;During functional activity Standing balance-Leahy Scale: Poor Standing balance comment: assist for balance with walker while PT and tech assisted with hygiene and donning brief following toileting activity                             Pertinent Vitals/Pain Pain Assessment: No/denies pain    Home Living Family/patient expects to be discharged to:: Private residence Living Arrangements: Spouse/significant other Available Help at Discharge: Family;Available 24 hours/day Type of Home: House Home Access: Stairs to enter Entrance Stairs-Rails: Right Entrance Stairs-Number of Steps: 4 Home Layout: One level Home Equipment: Walker - 2 wheels;Cane - single point;Bedside commode;Shower seat;Grab bars - tub/shower;Hand held shower head;Wheelchair - manual      Prior Function Level of Independence: Needs assistance   Gait / Transfers Assistance Needed: RW at all times and wife assists for boost to stand and for balance  ADL's / Homemaking Assistance Needed: Wife assists with all ADLs and community mobility        Hand Dominance   Dominant Hand: Right    Extremity/Trunk Assessment   Upper Extremity Assessment: Defer to OT evaluation           Lower Extremity Assessment: Generalized weakness      Cervical / Trunk Assessment: Kyphotic  Communication      Cognition Arousal/Alertness: Awake/alert Behavior During Therapy: WFL for tasks assessed/performed Overall Cognitive Status: Impaired/Different from baseline Area of Impairment: Problem solving;Following commands;Attention   Current Attention Level: Sustained   Following Commands: Follows one step commands with  increased time     Problem Solving: Slow processing;Decreased initiation;Difficulty sequencing;Requires verbal cues;Requires tactile cues      General Comments      Exercises        Assessment/Plan    PT Assessment Patient needs continued PT services  PT  Diagnosis Abnormality of gait   PT Problem List Decreased cognition;Decreased knowledge of use of DME;Decreased activity tolerance;Decreased balance;Decreased mobility;Decreased safety awareness  PT Treatment Interventions DME instruction;Stair training;Gait training;Functional mobility training;Therapeutic activities;Therapeutic exercise;Patient/family education;Balance training   PT Goals (Current goals can be found in the Care Plan section) Acute Rehab PT Goals Patient Stated Goal: To go home PT Goal Formulation: With patient/family Time For Goal Achievement: 08/20/15 Potential to Achieve Goals: Good    Frequency Min 3X/week   Barriers to discharge        Co-evaluation               End of Session Equipment Utilized During Treatment: Gait belt Activity Tolerance: Patient tolerated treatment well Patient left: in chair;with call bell/phone within reach;with chair alarm set;with nursing/sitter in room      Functional Assessment Tool Used: Clinical Judgement Functional Limitation: Mobility: Walking and moving around Mobility: Walking and Moving Around Current Status [phone]): At least 40 percent but less than 60 percent impaired, limited or restricted Mobility: Walking and Moving Around Goal Status [phone]): At least 20 percent but less than 40 percent impaired, limited or restricted    Time: 1110-1137 PT Time Calculation (min) (ACUTE ONLY): 27 min   Charges:   PT Evaluation $Initial PT Evaluation Tier I: 1 Procedure PT Treatments $Gait Training: 8-22 mins   PT G Codes:   PT G-Codes **NOT FOR INPATIENT CLASS** Functional Assessment Tool Used: Clinical Judgement Functional Limitation: Mobility: Walking and moving around Mobility: Walking and Moving Around Current Status [id number]): At least 40 percent but less than 60 percent impaired, limited or restricted Mobility: Walking and Moving Around Goal Status [phone]): At least 20 percent but less than 40 percent impaired,  limited or restricted    [name] 08/13/2015, 1:08 PM  [name], [place] 08/13/2015

## 2015-08-13 NOTE — Discharge Instructions (Signed)
Transient Ischemic Attack °A transient ischemic attack (TIA) is a "warning stroke" that causes stroke-like symptoms. Unlike a stroke, a TIA does not cause permanent damage to the brain. The symptoms of a TIA can happen very fast and do not last long. It is important to know the symptoms of a TIA and what to do. This can help prevent a major stroke or death. °CAUSES  °A TIA is caused by a temporary blockage in an artery in the brain or neck (carotid artery). The blockage does not allow the brain to get the blood supply it needs and can cause different symptoms. The blockage can be caused by either: °· A blood clot. °· Fatty buildup (plaque) in a neck or brain artery. °RISK FACTORS °· High blood pressure (hypertension). °· High cholesterol. °· Diabetes mellitus. °· Heart disease. °· The buildup of plaque in the blood vessels (peripheral artery disease or atherosclerosis). °· The buildup of plaque in the blood vessels that provide blood and oxygen to the brain (carotid artery stenosis). °· An abnormal heart rhythm (atrial fibrillation). °· Obesity. °· Using any tobacco products, including cigarettes, chewing tobacco, or electronic cigarettes. °· Taking oral contraceptives, especially in combination with using tobacco. °· Physical inactivity. °· A diet high in fats, salt (sodium), and calories. °· Excessive alcohol use. °· Use of illegal drugs (especially cocaine and methamphetamine). °· Being male. °· Being African American. °· Being over the age of 55 years. °· Family history of stroke. °· Previous history of blood clots, stroke, TIA, or heart attack. °· Sickle cell disease. °SIGNS AND SYMPTOMS  °TIA symptoms are the same as a stroke but are temporary. These symptoms usually develop suddenly, or may be newly present upon waking from sleep: °· Sudden weakness or numbness of the face, arm, or leg, especially on one side of the body. °· Sudden trouble walking or difficulty moving arms or legs. °· Sudden  confusion. °· Sudden personality changes. °· Trouble speaking (aphasia) or understanding. °· Difficulty swallowing. °· Sudden trouble seeing in one or both eyes. °· Double vision. °· Dizziness. °· Loss of balance or coordination. °· Sudden severe headache with no known cause. °· Trouble reading or writing. °· Loss of bowel or bladder control. °· Loss of consciousness. °DIAGNOSIS  °Your health care provider may be able to determine the presence or absence of a TIA based on your symptoms, history, and physical exam. CT scan of the brain is usually performed to help identify a TIA. Other tests may include: °· Electrocardiography (ECG). °· Continuous heart monitoring. °· Echocardiography. °· Carotid ultrasonography. °· MRI. °· A scan of the brain circulation. °· Blood tests. °TREATMENT  °Since the symptoms of TIA are the same as a stroke, it is important to seek treatment as soon as possible. You may need a medicine to dissolve a blood clot (thrombolytic) if that is the cause of the TIA. This medicine cannot be given if too much time has passed. Treatment may also include:  °· Rest, oxygen, fluids through an IV tube, and medicines to thin the blood (anticoagulants). °· Measures will be taken to prevent short-term and long-term complications, including infection from breathing foreign material into the lungs (aspiration pneumonia), blood clots in the legs, and falls. °· Procedures to either remove plaque in the carotid arteries or dilate carotid arteries that have narrowed due to plaque. Those procedures are: °¨ Carotid endarterectomy. °¨ Carotid angioplasty and stenting. °· Medicines and diet may be used to address diabetes, high blood pressure, and   other underlying risk factors. °HOME CARE INSTRUCTIONS  °· Take medicines only as directed by your health care provider. Follow the directions carefully. Medicines may be used to control risk factors for a stroke. Be sure you understand all your medicine instructions. °· You  may be told to take aspirin or the anticoagulant warfarin. Warfarin needs to be taken exactly as instructed. °¨ Taking too much or too little warfarin is dangerous. Too much warfarin increases the risk of bleeding. Too little warfarin continues to allow the risk for blood clots. While taking warfarin, you will need to have regular blood tests to measure your blood clotting time. A PT blood test measures how long it takes for blood to clot. Your PT is used to calculate another value called an INR. Your PT and INR help your health care provider to adjust your dose of warfarin. The dose can change for many reasons. It is critically important that you take warfarin exactly as prescribed. °¨ Many foods, especially foods high in vitamin K can interfere with warfarin and affect the PT and INR. Foods high in vitamin K include spinach, kale, broccoli, cabbage, collard and turnip greens, Brussels sprouts, peas, cauliflower, seaweed, and parsley, as well as beef and pork liver, green tea, and soybean oil. You should eat a consistent amount of foods high in vitamin K. Avoid major changes in your diet, or notify your health care provider before changing your diet. Arrange a visit with a dietitian to answer your questions. °¨ Many medicines can interfere with warfarin and affect the PT and INR. You must tell your health care provider about any and all medicines you take; this includes all vitamins and supplements. Be especially cautious with aspirin and anti-inflammatory medicines. Do not take or discontinue any prescribed or over-the-counter medicine except on the advice of your health care provider or pharmacist. °¨ Warfarin can have side effects, such as excessive bruising or bleeding. You will need to hold pressure over cuts for longer than usual. Your health care provider or pharmacist will discuss other potential side effects. °¨ Avoid sports or activities that may cause injury or bleeding. °¨ Be careful when shaving,  flossing your teeth, or handling sharp objects. °¨ Alcohol can change the body's ability to handle warfarin. It is best to avoid alcoholic drinks or consume only very small amounts while taking warfarin. Notify your health care provider if you change your alcohol intake. °¨ Notify your dentist or other health care providers before procedures. °· Eat a diet that includes 5 or more servings of fruits and vegetables each day. This may reduce the risk of stroke. Certain diets may be prescribed to address high blood pressure, high cholesterol, diabetes, or obesity. °¨ A diet low in sodium, saturated fat, trans fat, and cholesterol is recommended to manage high blood pressure. °¨ A diet low in saturated fat, trans fat, and cholesterol, and high in fiber may control cholesterol levels. °¨ A controlled-carbohydrate, controlled-sugar diet is recommended to manage diabetes. °¨ A reduced-calorie diet that is low in sodium, saturated fat, trans fat, and cholesterol is recommended to manage obesity. °· Maintain a healthy weight. °· Stay physically active. It is recommended that you get at least 30 minutes of activity on most or all days. °· Do not use any tobacco products, including cigarettes, chewing tobacco, or electronic cigarettes. If you need help quitting, ask your health care provider. °· Limit alcohol intake to no more than 1 drink per day for nonpregnant women and 2 drinks   per day for men. One drink equals 12 ounces of beer, 5 ounces of wine, or 1 ounces of hard liquor.  Do not abuse drugs.  A safe home environment is important to reduce the risk of falls. Your health care provider may arrange for specialists to evaluate your home. Having grab bars in the bedroom and bathroom is often important. Your health care provider may arrange for equipment to be used at home, such as raised toilets and a seat for the shower.  Follow all instructions for follow-up with your health care provider. This is very important.  This includes any referrals and lab tests. Proper follow-up can prevent a stroke or another TIA from occurring. PREVENTION  The risk of a TIA can be decreased by appropriately treating high blood pressure, high cholesterol, diabetes, heart disease, and obesity, and by quitting smoking, limiting alcohol, and staying physically active. SEEK MEDICAL CARE IF:  You have personality changes.  You have difficulty swallowing.  You are seeing double.  You have dizziness.  You have a fever. SEEK IMMEDIATE MEDICAL CARE IF:  Any of the following symptoms may represent a serious problem that is an emergency. Do not wait to see if the symptoms will go away. Get medical help right away. Call your local emergency services (911 in [place] Do not drive yourself to the hospital.  You have sudden weakness or numbness of the face, arm, or leg, especially on one side of the body.  You have sudden trouble walking or difficulty moving arms or legs.  You have sudden confusion.  You have trouble speaking (aphasia) or understanding.  You have sudden trouble seeing in one or both eyes.  You have a loss of balance or coordination.  You have a sudden, severe headache with no known cause.  You have new chest pain or an irregular heartbeat.  You have a partial or total loss of consciousness. MAKE SURE YOU:   Understand these instructions.  Will watch your condition.  Will get help right away if you are not doing well or get worse.   This information is not intended to replace advice given to you by your health care provider. Make sure you discuss any questions you have with your health care provider.   Document Released: 05/25/2005 Document Revised: 09/05/2014 Document Reviewed: 11/20/2013 Elsevier Interactive Patient Education 2016 Else[employer]   Dysphagia Diet Level 3, Mechanically Advanced The dysphagia level 3 diet includes foods that are soft, moist, and can be chopped into 1-inch chunks. This  diet is helpful for people with mild swallowing difficulties. It reduces the risk of food getting caught in the windpipe, trachea, or lungs. WHAT DO I NEED TO KNOW ABOUT THIS DIET?  You may eat foods that are soft and moist.  If you were on the dysphagia level 1 or level 2 diets, you may eat any of the foods included on those lists.  Avoid foods that are dry, hard, sticky, chewy, coarse, and crunchy. Also avoid large cuts of food.  Take small bites. Each bite should contain 1 inch or less of food.  Thicken liquids if instructed by your health care provider. Follow your health care provider's instructions on how to do this and to what consistency.  See your dietitian or speech language pathologist regularly for help with your dietary changes. WHAT FOODS CAN I EAT? Grains Moist breads without nuts or seeds. Biscuits, muffins, pancakes, and waffles well-moistened with syrup, jelly, margarine, or butter. Smooth cereals with plenty of milk  to moisten them. Moist bread stuffing. Moist rice. Vegetables All cooked, soft vegetables. Shredded lettuce. Tender fried potatoes. Fruits All canned and cooked fruits. Soft, peeled fresh fruits, such as peaches, nectarines, kiwis, cantaloupe, honeydew melon, and watermelon without seeds. Soft berries, such as strawberries. Meat and Other Protein Sources Moist ground or finely diced or sliced meats. Solid, tender cuts of meat. Meatloaf. Hamburger with a bun. Sausage patty. Deli thin-sliced lunch meat. Chicken, egg, or tuna salad sandwich. Sloppy joe. Moist fish. Eggs prepared any way. Casseroles with small chunks of meats, ground meats, or tender meats. Dairy Cheese spreads without coarse large chunks. Shredded cheese. Cheese slices. Cottage cheese. Milk at the right texture. Smooth frappes. Yogurt without nuts or coconut. Ask your health care provider whether you can have frozen desserts (such as malts or milk shakes) and thin liquids. Sweets/Desserts Soft,  smooth, moist desserts. Non-chewy, smooth candy. Jam. Jelly. Honey. Preserves. Ask your health care provider whether you can have frozen desserts. Fats and Oils Butter. Oils. Margarine. Mayonnaise. Gravy. Spreads. Other All seasonings and sweeteners. All sauces without large chunks. The items listed above may not be a complete list of recommended foods or beverages. Contact your dietitian for more options. WHAT FOODS ARE NOT RECOMMENDED? Grains Coarse or dry cereals. Dry breads. Toast. Crackers. Tough, crusty breads, such French bread and baguettes. Tough, crisp fried potatoes. Potato skins. Dry bread stuffing. Granola. Popcorn. Chips. Vegetables All raw vegetables except shredded lettuce. Cooked corn. Rubbery or stiff cooked vegetables. Stringy vegetables, such as celery. Fruits Hard fruits that are difficult to chew, such as apples or pears. Stringy, high-pulp fruits, such as pineapple, papaya, or mango. Fruits with tough skins, such as grapes. Coconut. All dried fruits. Fruit leather. Fruit roll-ups. Fruit snacks. Meat and Other Protein Sources Dry or tough meats or poultry. Dry fish. Fish with bones. Peanut butter. All nuts and seeds. Dairy  Any with nuts, seeds, chocolate chips, dried fruit, coconut, or pineapple. Sweets/Desserts Dry cakes. Chewy or dry cookies. Any with nuts, seeds, dry fruits, coconut, pineapple, or anything dry, sticky, or hard. Chewy caramel. Licorice. Taffy-type candies. Ask your health care provider whether you can have frozen desserts. Fats and Oils Any with chunks, nuts, seeds, or pineapple. Olives. [name]. Other Soups with tough or large chunks of meats, poultry, or vegetables. Corn or clam chowder. The items listed above may not be a complete list of foods and beverages to avoid. Contact your dietitian for more information.   This information is not intended to replace advice given to you by your health care provider. Make sure you discuss any questions you  have with your health care provider.   Document Released: 08/15/2005 Document Revised: 09/05/2014 Document Reviewed: 07/29/2013 Elsevier Interactive Patient Education [employer].

## 2015-08-13 NOTE — Care Management Note (Signed)
Case Management Note  Patient Details  Name: [name] MRN: [id number] Date of Birth: 1924-11-03  Subjective/Objective:                    Action/Plan: Plan is for patient to discharge home today with home health services. CM met with the patient and his wife and provided a list of home health agencies in the [facility] area. They selected [facility]. CM spoke with [facility] and faxed them the information requested. Bedside RN updated.   Expected Discharge Date:   (unknown)               Expected Discharge Plan:  [place]  In-House Referral:     Discharge planning Services  CM Consult  Post Acute Care Choice:    Choice offered to:  Spouse  DME Arranged:    DME Agency:     HH Arranged:  PT, OT HH Agency:  [place]  Status of Service:  Completed, signed off  Medicare Important Message Given:    Date Medicare IM Given:    Medicare IM give by:    Date Additional Medicare IM Given:    Additional Medicare Important Message give by:     If discussed at [place] of Stay Meetings, dates discussed:    Additional Comments:  Pollie Friar, RN 08/13/2015, 5:11 PM

## 2015-08-13 NOTE — Progress Notes (Addendum)
   08/12/15 1201  OT G-codes **NOT FOR INPATIENT CLASS**  Functional Assessment Tool Used clinical judgement  Functional Limitation Self care  Self Care Current Status [phone]) CK  Self Care Goal Status [id number]) [place], OTR/L [phone]

## 2015-08-13 NOTE — Procedures (Signed)
History: 81 yo M with transient aphasia  Sedation: None  Technique: This is a 21 channel routine scalp EEG performed at the bedside with bipolar and monopolar montages arranged in accordance to the international 10/20 system of electrode placement. One channel was dedicated to EKG recording.    Background: The background consists predominantly of alpha and beta activity, though there eis some generalized irregular delta throughout the recording. There is a posterior dominant rhythm of 8 Hz. There is frontally-predominant intermittent rhythmic delta activity(FIRDA) seen.   Photic stimulation: Physiologic driving is not performed  EEG Abnormalities: 1) FIRDA 2) Generalized irregular slow activity  Clinical Interpretation: This EEG is consistent with a generalized non-specific cerebral dysfunction(encephalopathy). There was no seizure or seizure predisposition recorded on this study.   Roland Rack, MD [facility] [phone]  If 7pm- 7am, please page neurology on call as listed in [place].

## 2015-08-13 NOTE — Progress Notes (Signed)
Patient is being discharged home with Home Health with wife he is stable IV removed, discharge summary reviewed with Wife no medication prescriptions.

## 2015-08-13 NOTE — Evaluation (Signed)
Clinical/Bedside Swallow Evaluation Patient Details  Name: [name] MRN: [id number] Date of Birth: April 26, 1925  Today's Date: 08/13/2015 Time: SLP Start Time (ACUTE ONLY): 24 SLP Stop Time (ACUTE ONLY): 1536 SLP Time Calculation (min) (ACUTE ONLY): 13 min  Past Medical History:  Past Medical History  Diagnosis Date  . Hypertension   . Hyperlipidemia   . Tobacco abuse   . Lung nodule     Left lung, seen 2012, no change in 2012, no follow up needed   . PNA (pneumonia) 2010    Bilateral Pneumonia, COPD 25mm LLL Nodule   . History of ETT 09/1991     POS   . History of MRI 04-22-06    L/S- right hnp  l5/si   . B12 deficiency   . TIA (transient ischemic attack)   . GERD (gastroesophageal reflux disease)   . Esophageal stricture   . Hemorrhoids   . Colon polyps     hyperplastic  . Pneumonia   . Macular degeneration   . Stroke [facility])    Past Surgical History:  Past Surgical History  Procedure Laterality Date  . Cystectomy  1995    Lumbar area   . Hip pinning,cannulated Left 03/28/2015    Procedure: CANNULATED HIP PINNING;  Surgeon: [name], MD;  Location: [facility] ORS;  Service: Orthopedics;  Laterality: Left;   HPI:  81 y.o. male admitted for sudden onset of aphasia. PMH significant for HTN, hyperlipidemia, GERD, TIA, CVA, and macular degeneration.MRI negative for acute infarct. Pt has a h/o multiple esophageal dilations. He has seen SLP several times, all of which have suggested a primary esophageal dysphagia.   Assessment / Plan / Recommendation Clinical Impression  Pt has audible congestion at baseline, which does not appear to change or worsen throughout PO intake. No overt signs of aspiration are noted, and swallow apepars to occur swiftly. Pt's wife describes a h/o esophageal issues, and reports alleviation of symptoms following esophageal dilation this past year. She also says that these symproms have reoccurred since that time. Strongly suspect a primary esophageal  dysphagia. Will soften solids to mechanical soft, continuing thin liquids, with use of esophageal and aspiration precautions. Given that RN reports difficulty taking medications this morning, would try meds in puree as well. SLP to f/u briefly for tolerance.    Aspiration Risk  Mild aspiration risk    Diet Recommendation  Dys 3, thin liquids   Medication Administration: Whole meds with puree    Other  Recommendations Oral Care Recommendations: Oral care BID   Follow up Recommendations  None    Frequency and Duration min 2x/week  1 week       Prognosis Prognosis for Safe Diet Advancement: Fair Barriers to Reach Goals: Other (Comment) (suspect esophageal component)      Swallow Study   General HPI: 81 y.o. male admitted for sudden onset of aphasia. PMH significant for HTN, hyperlipidemia, GERD, TIA, CVA, and macular degeneration.MRI negative for acute infarct. Pt has a h/o multiple esophageal dilations. He has seen SLP several times, all of which have suggested a primary esophageal dysphagia. Type of Study: Bedside Swallow Evaluation Previous Swallow Assessment: see HPI Diet Prior to this Study: Regular;Thin liquids Temperature Spikes Noted: No Respiratory Status: Room air History of Recent Intubation: No Behavior/Cognition: Alert;Cooperative;Pleasant mood Oral Care Completed by SLP: No Vision: Impaired for self-feeding Self-Feeding Abilities: Able to feed self;Needs assist Patient Positioning: Upright in bed Baseline Vocal Quality: Wet Volitional Cough: Congested Volitional Swallow: Able to elicit  Oral/Motor/Sensory Function     Ice [employer] chips: Not tested   Thin Liquid Thin Liquid: Within functional limits Presentation: Self Fed;Cup;Straw    Nectar Thick Nectar Thick Liquid: Not tested   Honey Thick Honey Thick Liquid: Not tested   Puree Puree: Within functional limits Presentation: Self Fed;Spoon   Solid Solid: Within functional limits Presentation: Self Fed       Germain Osgood, M.A. CCC-SLP [phone]  Germain Osgood 08/13/2015,3:56 PM

## 2015-08-13 NOTE — Progress Notes (Signed)
EEG completed, results pending. 

## 2015-08-13 NOTE — Progress Notes (Signed)
STROKE TEAM PROGRESS NOTE   HISTORY [name] is a 81 y.o. male with a history of hypertension, hyperlipidemia who presents with transient aphasia. His wife is with him and states that he suddenly stopped making sense. There were no words, rather "gobeldy-gook." This lasted for approximately 15 minutes after which the patient's speech returned normal. He has full memory of the episode and states that he was unable to understand anything that anybody was saying. He denies numbness, weakness, changes in consciousness, lightheadedness. Of note the patient ran out of his aspirin one week ago, and was recently taken off some of his blood pressure medicine now has a systolic of [id number]. He was LKW 08/11/2015 at 11:30 AM. Patient was not administered TPA secondary to resolution of symptoms. He was admitted for further evaluation and treatment.   SUBJECTIVE (INTERVAL HISTORY) His famly is at the bedside.  Overall they feel his condition is much improved. He is not confused. Speech is normal. MRI scan of the brain shows no acute infarct. EEG is pending. OBJECTIVE Temp:  [97.9 F (36.6 C)-98.1 F (36.7 C)] 98 F (36.7 C) (12/15 1042) Pulse Rate:  [69-85] 70 (12/15 1042) Cardiac Rhythm:  [-] Normal sinus rhythm (12/15 0800) Resp:  [18-20] 18 (12/15 1042) BP: (152-207)/(23-95) 152/63 mmHg (12/15 1042) SpO2:  [95 %-98 %] 95 % (12/15 1042)  CBC:   Recent Labs Lab 08/11/15 1410 08/11/15 1411  WBC  --  7.6  NEUTROABS  --  4.8  HGB 15.3 13.4  HCT 45.0 41.2  MCV  --  83.7  PLT  --  99991111    Basic Metabolic Panel:   Recent Labs Lab 08/11/15 1410 08/11/15 1411  NA 135 134*  K 3.9 4.0  CL 95* 96*  CO2  --  30  GLUCOSE 90 92  BUN 18 18  CREATININE 1.10 1.15  CALCIUM  --  9.0    Lipid Panel:     Component Value Date/Time   CHOL 201* 08/12/2015 0539   TRIG 98 08/12/2015 0539   HDL 41 08/12/2015 0539   CHOLHDL 4.9 08/12/2015 0539   VLDL 20 08/12/2015 0539   LDLCALC 140* 08/12/2015  0539   HgbA1c:  Lab Results  Component Value Date   HGBA1C 5.6 08/12/2015   Urine Drug Screen:     Component Value Date/Time   LABOPIA NONE DETECTED 08/12/2015 0241   COCAINSCRNUR NONE DETECTED 08/12/2015 0241   LABBENZ NONE DETECTED 08/12/2015 0241   AMPHETMU NONE DETECTED 08/12/2015 0241   THCU NONE DETECTED 08/12/2015 0241   LABBARB NONE DETECTED 08/12/2015 0241      IMAGING  Dg Chest 2 View  08/11/2015  CLINICAL DATA:  Cough, history hypertension, smoking, stroke EXAM: CHEST  2 VIEW COMPARISON:  04/01/2015 Correlation: CT chest 02/09/2009 FINDINGS: Minimal enlargement of cardiac silhouette. Atherosclerotic calcification aorta. Mediastinal contours and pulmonary vascularity normal. 12 mm nodular density LEFT upper lobe unchanged since 02/09/2009, shown to be a stable sclerotic focus within the anterior LEFT third rib. Lungs emphysematous but otherwise clear. No pleural effusion or pneumothorax. Bones demineralized. IMPRESSION: COPD changes. Enlargement of cardiac silhouette. No acute abnormalities. Electronically Signed   By: Lavonia Dana M.D.   On: 08/11/2015 21:40   Ct Head Wo Contrast  08/11/2015  CLINICAL DATA:  Acute onset aphasia EXAM: CT HEAD WITHOUT CONTRAST TECHNIQUE: Contiguous axial images were obtained from the base of the skull through the vertex without intravenous contrast. COMPARISON:  August 08, 2014 FINDINGS: Moderate diffuse atrophy is  stable. There is no intracranial mass, hemorrhage, extra-axial fluid collection, or midline shift. There is patchy small vessel disease throughout the centra semiovale bilaterally, somewhat more severe on the right than on the left, and stable. There is a prior lacunar type infarct in the posterior limb of the left internal capsule involving the lateral most aspect of the left thalamus, stable. There is no new gray-white compartment lesion. No acute infarct is evident. The bony calvarium appears intact. Visualized mastoid air cells are  clear. No intraorbital lesions are identified. IMPRESSION: Atrophy with patchy periventricular small vessel disease, stable. Prior lacunar type infarct in the posterior limb of the left internal capsule involving the lateral most aspect of the left thalamus, stable. No acute infarct evident. No hemorrhage or mass effect. Electronically Signed   By: Lowella Grip III M.D.   On: 08/11/2015 14:51   Mr Brain Wo Contrast  08/12/2015  CLINICAL DATA:  Episode of slurred speech lasting 15 minutes earlier today. No other acute symptoms. The patient has returned to baseline. EXAM: MRI HEAD WITHOUT CONTRAST MRA HEAD WITHOUT CONTRAST TECHNIQUE: Multiplanar, multiecho pulse sequences of the brain and surrounding structures were obtained without intravenous contrast. Angiographic images of the head were obtained using MRA technique without contrast. COMPARISON:  CT head without contrast 08/11/2015 and 03/15/2011 FINDINGS: MRI HEAD FINDINGS The diffusion-weighted images demonstrate no evidence for acute or subacute infarction. The study is mildly degraded by patient motion. Moderate atrophy and white matter disease is similar to the prior exam. Remote lacunar infarcts are noted in the white matter adjacent to the atrium of the lateral ventricles. Remote lacunar infarcts are noted in the basal ganglia bilaterally. No acute hemorrhage or mass lesion is present. Flow is present in the major intracranial arteries. Globes and orbits are intact. The paranasal sinuses and mastoid air cells are clear. MRA HEAD FINDINGS The study is severely degraded by patient motion. This significantly limits evaluation of medium and small vessels. Signal loss is present in the cavernous internal carotid arteries bilaterally. The internal carotid arteries are otherwise within normal limits from the high cervical segments through the ICA termini. The A1 and M1 segments are normal. There is significant loss of signal the on the MCA bifurcations  bilaterally. The vertebral arteries are codominant. The PICA vessels are not seen. The basilar artery is normal. Both posterior cerebral arteries originate from the basilar tip. There is significant attenuation of PCA branch vessels. IMPRESSION: 1. Both the MRI brain and MRA head are severely degraded by patient motion. 2. No acute intracranial abnormality. 3. Stable atrophy and diffuse white matter disease. 4. Signal loss in the cavernous internal carotid arteries bilaterally likely reflects mild atherosclerotic changes. Stenosis is exaggerated by patient motion. 5. Evaluation of medium and distal small vessels cannot be performed due to the severe motion degradation on the MRA. Electronically Signed   By: San Morelle M.D.   On: 08/12/2015 17:05   Mr Jodene Nam Head/brain Wo Cm  08/12/2015  CLINICAL DATA:  Episode of slurred speech lasting 15 minutes earlier today. No other acute symptoms. The patient has returned to baseline. EXAM: MRI HEAD WITHOUT CONTRAST MRA HEAD WITHOUT CONTRAST TECHNIQUE: Multiplanar, multiecho pulse sequences of the brain and surrounding structures were obtained without intravenous contrast. Angiographic images of the head were obtained using MRA technique without contrast. COMPARISON:  CT head without contrast 08/11/2015 and 03/15/2011 FINDINGS: MRI HEAD FINDINGS The diffusion-weighted images demonstrate no evidence for acute or subacute infarction. The study is mildly degraded by  patient motion. Moderate atrophy and white matter disease is similar to the prior exam. Remote lacunar infarcts are noted in the white matter adjacent to the atrium of the lateral ventricles. Remote lacunar infarcts are noted in the basal ganglia bilaterally. No acute hemorrhage or mass lesion is present. Flow is present in the major intracranial arteries. Globes and orbits are intact. The paranasal sinuses and mastoid air cells are clear. MRA HEAD FINDINGS The study is severely degraded by patient motion.  This significantly limits evaluation of medium and small vessels. Signal loss is present in the cavernous internal carotid arteries bilaterally. The internal carotid arteries are otherwise within normal limits from the high cervical segments through the ICA termini. The A1 and M1 segments are normal. There is significant loss of signal the on the MCA bifurcations bilaterally. The vertebral arteries are codominant. The PICA vessels are not seen. The basilar artery is normal. Both posterior cerebral arteries originate from the basilar tip. There is significant attenuation of PCA branch vessels. IMPRESSION: 1. Both the MRI brain and MRA head are severely degraded by patient motion. 2. No acute intracranial abnormality. 3. Stable atrophy and diffuse white matter disease. 4. Signal loss in the cavernous internal carotid arteries bilaterally likely reflects mild atherosclerotic changes. Stenosis is exaggerated by patient motion. 5. Evaluation of medium and distal small vessels cannot be performed due to the severe motion degradation on the MRA. Electronically Signed   By: San Morelle M.D.   On: 08/12/2015 17:05   2D Echocardiogram  - Procedure narrative: Transthoracic echocardiography. Imagequality was poor. The study was technically difficult, as aresult of poor acoustic windows, poor sound wave transmission,and restricted patient mobility. - Left ventricle: The cavity size was normal. Wall thickness wasincreased in a pattern of moderate LVH. Systolic function wasnormal. The estimated ejection fraction was in the range of 55%to 60%. Wall motion was normal; there were no regional wallmotion abnormalities. Doppler parameters are consistent withabnormal left ventricular relaxation (grade 1 diastolicdysfunction). - Aortic valve: Valve area (VTI): 1.28 cm^2. Valve area (Vmax): 1.4cm^2. Valve area (Vmean): 1.4 cm^2. - Mitral valve: Calcified annulus.   PHYSICAL EXAM Frail elderly Caucasian male not  in distress. . Afebrile. Head is nontraumatic. Neck is supple without bruit.    Cardiac exam no murmur or gallop. Lungs are clear to auscultation. Distal pulses are well felt. Neurological Exam ;  Awake  Alert oriented x 2. Diminished attention, registration and recall.. Normal speech and language.eye movements full without nystagmus.fundi were not visualized. Vision acuity and fields appear normal. Hearing is normal. Palatal movements are normal. Face symmetric. Tongue midline. Normal strength, tone, reflexes and coordination. Normal sensation. Gait deferred. ASSESSMENT/PLAN [name] is a 81 y.o. male with history of HTN and  HLD presenting with transient aphasia. He did not receive IV t-PA due to resolution of symptoms.   Stroke/TIA vs Seizure  Resultant  Confusion likely mild baseline dementia/cognitive impairment with sundowning  MRI  No acute infarct  MRA  Motion degraded  Carotid Doppler  pending   2D Echo  No source of embolus   LDL 140  HgbA1c 6.2  Lovenox 40 mg sq daily for VTE prophylaxis Diet Heart Room service appropriate?: Yes; Fluid consistency:: Thin  aspirin 325 mg daily prior to admission, now on No antithrombotic. Resume aspirin. Ordered.  Ongoing aggressive stroke risk factor management  Therapy recommendations:  pending  Disposition:  pending   Hypertension  elevated  Hyperlipidemia  Home meds:  zocor 20,  resumed in  hospital  LDL 140, goal < 70  Continue statin at discharge  Other Stroke Risk Factors  Advanced age  Former Cigarette smoker, quit smoking 29 years ago  Oral tobacco use (chew)  Hx stroke/TIA -   Other Active Problems  Patient gets confused when he gets sick (family recounts hx of confusion with fx hip, PNA, etc)  Hospital day #    I have personally examined this patient, reviewed notes, independently viewed imaging studies, participated in medical decision making and plan of care. I have made any additions or  clarifications directly to the above note. Marland Kitchen He presented with transient speech difficulties likely left hemispheric TIA. . I had a long discussion with his family at the bedside and answered questions. I suspect he has underlying mild dementia and cognitive impairment at baseline given his confusion with multiple  Recent hospitalizations in the past. Plan DC home after EEG and f/u as outpatient in stroke clinic in 2 months. D/W Dr Algis Liming  Chase Contras, MD Medical Director [place] Pager: [phone] 08/13/2015 12:16 PM    To contact Stroke Continuity provider, please refer to [email or web address] After hours, contact General Neurology

## 2015-08-14 ENCOUNTER — Emergency Department (HOSPITAL_COMMUNITY): Payer: Medicare Other

## 2015-08-14 ENCOUNTER — Encounter (HOSPITAL_COMMUNITY): Payer: Self-pay | Admitting: Emergency Medicine

## 2015-08-14 ENCOUNTER — Telehealth: Payer: Self-pay | Admitting: Family Medicine

## 2015-08-14 ENCOUNTER — Emergency Department (HOSPITAL_COMMUNITY)
Admission: EM | Admit: 2015-08-14 | Discharge: 2015-08-17 | Disposition: A | Discharge status: Home / Self Care | Payer: Medicare Other | Attending: Emergency Medicine | Admitting: Emergency Medicine

## 2015-08-14 DIAGNOSIS — Z86010 Personal history of colonic polyps: Secondary | ICD-10-CM | POA: Diagnosis not present

## 2015-08-14 DIAGNOSIS — Z79899 Other long term (current) drug therapy: Secondary | ICD-10-CM | POA: Diagnosis not present

## 2015-08-14 DIAGNOSIS — R05 Cough: Secondary | ICD-10-CM | POA: Diagnosis not present

## 2015-08-14 DIAGNOSIS — Z8673 Personal history of transient ischemic attack (TIA), and cerebral infarction without residual deficits: Secondary | ICD-10-CM | POA: Diagnosis not present

## 2015-08-14 DIAGNOSIS — Z87891 Personal history of nicotine dependence: Secondary | ICD-10-CM | POA: Insufficient documentation

## 2015-08-14 DIAGNOSIS — R531 Weakness: Secondary | ICD-10-CM | POA: Insufficient documentation

## 2015-08-14 DIAGNOSIS — I10 Essential (primary) hypertension: Secondary | ICD-10-CM | POA: Diagnosis not present

## 2015-08-14 DIAGNOSIS — K219 Gastro-esophageal reflux disease without esophagitis: Secondary | ICD-10-CM | POA: Diagnosis not present

## 2015-08-14 DIAGNOSIS — Z8701 Personal history of pneumonia (recurrent): Secondary | ICD-10-CM | POA: Insufficient documentation

## 2015-08-14 DIAGNOSIS — E785 Hyperlipidemia, unspecified: Secondary | ICD-10-CM | POA: Diagnosis not present

## 2015-08-14 DIAGNOSIS — R410 Disorientation, unspecified: Secondary | ICD-10-CM | POA: Diagnosis not present

## 2015-08-14 LAB — RAPID URINE DRUG SCREEN, HOSP PERFORMED
AMPHETAMINES: NOT DETECTED
BARBITURATES: NOT DETECTED
BENZODIAZEPINES: NOT DETECTED
Cocaine: NOT DETECTED
Opiates: NOT DETECTED
TETRAHYDROCANNABINOL: NOT DETECTED

## 2015-08-14 LAB — I-STAT TROPONIN, ED: Troponin i, poc: 0.01 ng/mL (ref 0.00–0.08)

## 2015-08-14 LAB — CBC
HCT: 42.2 % (ref 39.0–52.0)
HEMOGLOBIN: 13.7 g/dL (ref 13.0–17.0)
MCH: 26.7 pg (ref 26.0–34.0)
MCHC: 32.5 g/dL (ref 30.0–36.0)
MCV: 82.3 fL (ref 78.0–100.0)
PLATELETS: 262 10*3/uL (ref 150–400)
RBC: 5.13 MIL/uL (ref 4.22–5.81)
RDW: 14.8 % (ref 11.5–15.5)
WBC: 9.1 10*3/uL (ref 4.0–10.5)

## 2015-08-14 LAB — DIFFERENTIAL
Basophils Absolute: 0 10*3/uL (ref 0.0–0.1)
Basophils Relative: 0 %
EOS ABS: 0.2 10*3/uL (ref 0.0–0.7)
EOS PCT: 2 %
LYMPHS ABS: 2.3 10*3/uL (ref 0.7–4.0)
Lymphocytes Relative: 25 %
MONO ABS: 0.6 10*3/uL (ref 0.1–1.0)
Monocytes Relative: 6 %
NEUTROS PCT: 67 %
Neutro Abs: 6.1 10*3/uL (ref 1.7–7.7)

## 2015-08-14 LAB — PROTIME-INR
INR: 1.02 (ref 0.00–1.49)
PROTHROMBIN TIME: 13.6 s (ref 11.6–15.2)

## 2015-08-14 LAB — COMPREHENSIVE METABOLIC PANEL
ALBUMIN: 3.2 g/dL — AB (ref 3.5–5.0)
ALK PHOS: 100 U/L (ref 38–126)
ALT: 20 U/L (ref 17–63)
AST: 30 U/L (ref 15–41)
Anion gap: 8 (ref 5–15)
BUN: 17 mg/dL (ref 6–20)
CALCIUM: 8.9 mg/dL (ref 8.9–10.3)
CHLORIDE: 96 mmol/L — AB (ref 101–111)
CO2: 30 mmol/L (ref 22–32)
Creatinine, Ser: 1.22 mg/dL (ref 0.61–1.24)
GFR calc Af Amer: 59 mL/min — ABNORMAL LOW (ref >60)
GFR calc non Af Amer: 51 mL/min — ABNORMAL LOW (ref >60)
Glucose, Bld: 95 mg/dL (ref 65–99)
Potassium: 3.8 mmol/L (ref 3.5–5.1)
Sodium: 134 mmol/L — ABNORMAL LOW (ref 135–145)
Total Bilirubin: 0.5 mg/dL (ref 0.3–1.2)
Total Protein: 6.3 g/dL — ABNORMAL LOW (ref 6.5–8.1)

## 2015-08-14 LAB — I-STAT CHEM 8, ED
BUN: 22 mg/dL — ABNORMAL HIGH (ref 6–20)
Calcium, Ion: 1.14 mmol/L (ref 1.13–1.30)
Chloride: 93 mmol/L — ABNORMAL LOW (ref 101–111)
Creatinine, Ser: 1.1 mg/dL (ref 0.61–1.24)
Glucose, Bld: 91 mg/dL (ref 65–99)
HCT: 46 % (ref 39.0–52.0)
HEMOGLOBIN: 15.6 g/dL (ref 13.0–17.0)
Potassium: 3.8 mmol/L (ref 3.5–5.1)
Sodium: 135 mmol/L (ref 135–145)
TCO2: 30 mmol/L (ref 0–100)

## 2015-08-14 LAB — URINALYSIS, ROUTINE W REFLEX MICROSCOPIC
BILIRUBIN URINE: NEGATIVE
Glucose, UA: NEGATIVE mg/dL
KETONES UR: 15 mg/dL — AB
Leukocytes, UA: NEGATIVE
NITRITE: NEGATIVE
PROTEIN: 100 mg/dL — AB
Specific Gravity, Urine: 1.014 (ref 1.005–1.030)
pH: 6.5 (ref 5.0–8.0)

## 2015-08-14 LAB — URINE MICROSCOPIC-ADD ON

## 2015-08-14 LAB — ETHANOL: Alcohol, Ethyl (B): <5 mg/dL (ref <5)

## 2015-08-14 LAB — APTT: aPTT: 31 seconds (ref 24–37)

## 2015-08-14 MED ORDER — DOCUSATE SODIUM 100 MG PO CAPS
100.0000 mg | ORAL_CAPSULE | Freq: Two times a day (BID) | ORAL | Status: DC
  Administered 2015-08-14 – 2015-08-17 (×5): 100 mg via ORAL
  Filled 2015-08-14 (×6): qty 1

## 2015-08-14 MED ORDER — PROSIGHT PO TABS
1.0000 | ORAL_TABLET | Freq: Two times a day (BID) | ORAL | Status: DC
  Administered 2015-08-14 – 2015-08-17 (×5): 1 via ORAL
  Filled 2015-08-14 (×7): qty 1

## 2015-08-14 MED ORDER — TOBRAMYCIN 0.3 % OP SOLN
1.0000 [drp] | Freq: Two times a day (BID) | OPHTHALMIC | Status: DC
  Administered 2015-08-14 – 2015-08-17 (×5): 1 [drp] via OPHTHALMIC
  Filled 2015-08-14: qty 5

## 2015-08-14 MED ORDER — ASPIRIN EC 325 MG PO TBEC
325.0000 mg | DELAYED_RELEASE_TABLET | Freq: Every day | ORAL | Status: DC
  Administered 2015-08-14 – 2015-08-17 (×4): 325 mg via ORAL
  Filled 2015-08-14 (×4): qty 1

## 2015-08-14 MED ORDER — SIMVASTATIN 20 MG PO TABS
20.0000 mg | ORAL_TABLET | Freq: Every day | ORAL | Status: DC
Start: 2015-08-14 — End: 2015-08-17
  Administered 2015-08-14 – 2015-08-16 (×2): 20 mg via ORAL
  Filled 2015-08-14 (×4): qty 1

## 2015-08-14 MED ORDER — ALBUTEROL SULFATE HFA 108 (90 BASE) MCG/ACT IN AERS: 2.0000 | INHALATION_SPRAY | Freq: Four times a day (QID) | RESPIRATORY_TRACT | Status: DC | PRN

## 2015-08-14 MED ORDER — PRESERVISION AREDS 2 PO CAPS: 1.0000 | ORAL_CAPSULE | Freq: Two times a day (BID) | ORAL | Status: DC

## 2015-08-14 MED ORDER — PANTOPRAZOLE SODIUM 40 MG PO TBEC
40.0000 mg | DELAYED_RELEASE_TABLET | Freq: Every day | ORAL | Status: DC
  Administered 2015-08-14 – 2015-08-17 (×4): 40 mg via ORAL
  Filled 2015-08-14 (×4): qty 1

## 2015-08-14 MED ORDER — HYDROCODONE-ACETAMINOPHEN 5-325 MG PO TABS
1.0000 | ORAL_TABLET | Freq: Four times a day (QID) | ORAL | Status: DC | PRN
Start: 2015-08-14 — End: 2015-08-17

## 2015-08-14 NOTE — ED Provider Notes (Addendum)
CSN: [id number]     Arrival date & time 08/14/15  1318 History   First MD Initiated Contact with Patient 08/14/15 1406     Chief Complaint  Patient presents with  . Weakness     (Consider location/radiation/quality/duration/timing/severity/associated sxs/prior Treatment) HPI  81 year old male presents for evaluation of weakness. He was discharged from the hospital yesterday. 3 days ago he was admitted for transient aphasia which was attributed to a TIA. Workup was otherwise negative. Family states he has been more confused but are not exactly sure when this started. Patient apparently has not been able to walk per family. They state he could not walk while in the hospital but these he was able to ambulate normally just prior to these symptoms starting 3 days ago. They state he was sent home despite not been able to walk and has had to be carried everywhere including in and out of the car into his bed. He is eating and drinking normally. He has not had any fevers. He has a chronic cough that is not worse. No vomiting, diarrhea. Patient is able to stand with assistance but has a hard time walking.  Past Medical History  Diagnosis Date  . Hypertension   . Hyperlipidemia   . Tobacco abuse   . Lung nodule     Left lung, seen 2012, no change in 2012, no follow up needed   . PNA (pneumonia) 2010    Bilateral Pneumonia, COPD 64mm LLL Nodule   . History of ETT 09/1991     POS   . History of MRI 04-22-06    L/S- right hnp  l5/si   . B12 deficiency   . TIA (transient ischemic attack)   . GERD (gastroesophageal reflux disease)   . Esophageal stricture   . Hemorrhoids   . Colon polyps     hyperplastic  . Pneumonia   . Macular degeneration   . Stroke [facility])    Past Surgical History  Procedure Laterality Date  . Cystectomy  1995    Lumbar area   . Hip pinning,cannulated Left 03/28/2015    Procedure: CANNULATED HIP PINNING;  Surgeon: [name], MD;  Location: [facility];  Service: Orthopedics;   Laterality: Left;   Family History  Problem Relation Age of Onset  . Hypertension Mother   . Heart attack Father   . Colon cancer Neg Hx   . Esophageal cancer Neg Hx    Social History  Substance Use Topics  . Smoking status: Former Smoker    Types: Cigarettes    Quit date: 08/29/1985  . Smokeless tobacco: Former Systems developer    Types: Chew  . Alcohol Use: No    Review of Systems  Constitutional: Negative for fever.  Respiratory: Positive for cough. Negative for shortness of breath.   Cardiovascular: Negative for chest pain and leg swelling.  Gastrointestinal: Negative for vomiting, abdominal pain and diarrhea.  Genitourinary: Negative for dysuria.  Neurological: Positive for weakness. Negative for headaches.  Psychiatric/Behavioral: Positive for confusion.  All other systems reviewed and are negative.     Allergies  Review of patient's allergies indicates no known allergies.  Home Medications   Prior to Admission medications   Medication Sig Start Date End Date Taking? Authorizing Provider  albuterol (PROVENTIL HFA;VENTOLIN HFA) 108 (90 BASE) MCG/ACT inhaler Inhale 2 puffs into the lungs every 6 (six) hours as needed for wheezing or shortness of breath. 07/31/15   Tonia Ghent, MD  amLODipine (NORVASC) 10 MG tablet Take 1  tablet (10 mg total) by mouth daily. 07/31/15   Tonia Ghent, MD  aspirin EC 325 MG tablet Take 1 tablet (325 mg total) by mouth daily. 08/13/15   Modena Jansky, MD  docusate sodium (COLACE) 100 MG capsule Take 1 capsule (100 mg total) by mouth 2 (two) times daily. 03/30/15   Modena Jansky, MD  HYDROcodone-acetaminophen (NORCO) 5-325 MG tablet Take 1 tablet by mouth every 6 (six) hours as needed. 07/31/15   Tonia Ghent, MD  Multiple Vitamins-Minerals (PRESERVISION AREDS 2) CAPS Take 1 tablet by mouth 2 (two) times daily. Take as directed. 03/30/15   Modena Jansky, MD  NON FORMULARY Oxygen 2 liters as needed    Historical Provider, MD  pantoprazole  (PROTONIX) 40 MG tablet Take 40 mg by mouth daily.    Historical Provider, MD  simvastatin (ZOCOR) 20 MG tablet Take 20 mg by mouth daily.    Historical Provider, MD  tobramycin (TOBREX) 0.3 % ophthalmic solution Place 1 drop into both eyes 2 (two) times daily.  02/27/15   Historical Provider, MD   BP 206/96 mmHg  Pulse 71  Temp(Src) 98 F (36.7 C) (Oral)  Resp 16  Ht 5\' 6"  (1.676 m)  SpO2 96% Physical Exam  Constitutional: He is oriented to person, place, and time. He appears well-developed and well-nourished.  HENT:  Head: Normocephalic and atraumatic.  Right Ear: External ear normal.  Left Ear: External ear normal.  Nose: Nose normal.  Eyes: Right eye exhibits no discharge. Left eye exhibits no discharge.  Neck: Neck supple.  Cardiovascular: Normal rate, regular rhythm, normal heart sounds and intact distal pulses.   Pulmonary/Chest: Effort normal and breath sounds normal. He has no wheezes.  Abdominal: Soft. There is no tenderness.  Musculoskeletal: He exhibits no edema.  Neurological: He is alert and oriented to person, place, and time.  Patient is alert and oriented to self, time (including day of the week, month, and year). CN II-12 grossly intact. 5/5 strength in all 4 extremities.  Skin: Skin is warm and dry.  Nursing note and vitals reviewed.   ED Course  Procedures (including critical care time) Labs Review Labs Reviewed  COMPREHENSIVE METABOLIC PANEL - Abnormal; Notable for the following:    Sodium 134 (*)    Chloride 96 (*)    Total Protein 6.3 (*)    Albumin 3.2 (*)    GFR calc non Af Amer 51 (*)    GFR calc Af Amer 59 (*)    All other components within normal limits  I-STAT CHEM 8, ED - Abnormal; Notable for the following:    Chloride 93 (*)    BUN 22 (*)    All other components within normal limits  ETHANOL  PROTIME-INR  APTT  CBC  DIFFERENTIAL  URINE RAPID DRUG SCREEN, HOSP PERFORMED  URINALYSIS, ROUTINE W REFLEX MICROSCOPIC (NOT AT [facility])  Randolm Idol, ED    Imaging Review Dg Chest 2 View  08/14/2015  CLINICAL DATA:  Cough and weakness Xfew days; hx HTN, ex-smoker EXAM: CHEST  2 VIEW COMPARISON:  08/11/2015 FINDINGS: Lateral view degraded, including secondary to arm position. Osteopenia. Patient rotated left on the frontal radiograph. Mild cardiomegaly with transverse aortic atherosclerosis. No pleural effusion or pneumothorax. Bibasilar scarring or atelectasis. No congestive failure. IMPRESSION: Cardiomegaly and aortic atherosclerosis, without acute disease. Electronically Signed   By: Abigail Miyamoto M.D.   On: 08/14/2015 15:01   Ct Head Wo Contrast  08/14/2015  CLINICAL DATA:  Gait disturbance ; altered mental status EXAM: CT HEAD WITHOUT CONTRAST TECHNIQUE: Contiguous axial images were obtained from the base of the skull through the vertex without intravenous contrast. COMPARISON:  August 11, 2015 head CT; December 14 26 brain MRI FINDINGS: Moderate diffuse atrophy is stable. There is no intracranial mass, hemorrhage, extra-axial fluid collection, or midline shift. There is small vessel disease in the centra semiovale bilaterally, stable. There is evidence of a prior focal lacunar infarct in the mid right centrum semiovale, stable. There is also a prior lacunar type infarct in the posterior limb of the left internal capsule, stable. No new gray-white compartment lesions are evident. No acute infarct evident. Bony calvarium appears intact. Mastoid air cells are clear. No intraorbital lesions are identified. IMPRESSION: Stable diffuse atrophy. The configuration of the ventricles raises question of possible concomitant communicating hydrocephalus. There is supratentorial small vessel disease, stable. No acute infarct evident. No hemorrhage or mass effect. Electronically Signed   By: Lowella Grip III M.D.   On: 08/14/2015 15:29   Mr Brain Wo Contrast  08/12/2015  CLINICAL DATA:  Episode of slurred speech lasting 15 minutes earlier  today. No other acute symptoms. The patient has returned to baseline. EXAM: MRI HEAD WITHOUT CONTRAST MRA HEAD WITHOUT CONTRAST TECHNIQUE: Multiplanar, multiecho pulse sequences of the brain and surrounding structures were obtained without intravenous contrast. Angiographic images of the head were obtained using MRA technique without contrast. COMPARISON:  CT head without contrast 08/11/2015 and 03/15/2011 FINDINGS: MRI HEAD FINDINGS The diffusion-weighted images demonstrate no evidence for acute or subacute infarction. The study is mildly degraded by patient motion. Moderate atrophy and white matter disease is similar to the prior exam. Remote lacunar infarcts are noted in the white matter adjacent to the atrium of the lateral ventricles. Remote lacunar infarcts are noted in the basal ganglia bilaterally. No acute hemorrhage or mass lesion is present. Flow is present in the major intracranial arteries. Globes and orbits are intact. The paranasal sinuses and mastoid air cells are clear. MRA HEAD FINDINGS The study is severely degraded by patient motion. This significantly limits evaluation of medium and small vessels. Signal loss is present in the cavernous internal carotid arteries bilaterally. The internal carotid arteries are otherwise within normal limits from the high cervical segments through the ICA termini. The A1 and M1 segments are normal. There is significant loss of signal the on the MCA bifurcations bilaterally. The vertebral arteries are codominant. The PICA vessels are not seen. The basilar artery is normal. Both posterior cerebral arteries originate from the basilar tip. There is significant attenuation of PCA branch vessels. IMPRESSION: 1. Both the MRI brain and MRA head are severely degraded by patient motion. 2. No acute intracranial abnormality. 3. Stable atrophy and diffuse white matter disease. 4. Signal loss in the cavernous internal carotid arteries bilaterally likely reflects mild  atherosclerotic changes. Stenosis is exaggerated by patient motion. 5. Evaluation of medium and distal small vessels cannot be performed due to the severe motion degradation on the MRA. Electronically Signed   By: San Morelle M.D.   On: 08/12/2015 17:05   Mr Jodene Nam Head/brain Wo Cm  08/12/2015  CLINICAL DATA:  Episode of slurred speech lasting 15 minutes earlier today. No other acute symptoms. The patient has returned to baseline. EXAM: MRI HEAD WITHOUT CONTRAST MRA HEAD WITHOUT CONTRAST TECHNIQUE: Multiplanar, multiecho pulse sequences of the brain and surrounding structures were obtained without intravenous contrast. Angiographic images of the head were obtained using MRA technique without contrast.  COMPARISON:  CT head without contrast 08/11/2015 and 03/15/2011 FINDINGS: MRI HEAD FINDINGS The diffusion-weighted images demonstrate no evidence for acute or subacute infarction. The study is mildly degraded by patient motion. Moderate atrophy and white matter disease is similar to the prior exam. Remote lacunar infarcts are noted in the white matter adjacent to the atrium of the lateral ventricles. Remote lacunar infarcts are noted in the basal ganglia bilaterally. No acute hemorrhage or mass lesion is present. Flow is present in the major intracranial arteries. Globes and orbits are intact. The paranasal sinuses and mastoid air cells are clear. MRA HEAD FINDINGS The study is severely degraded by patient motion. This significantly limits evaluation of medium and small vessels. Signal loss is present in the cavernous internal carotid arteries bilaterally. The internal carotid arteries are otherwise within normal limits from the high cervical segments through the ICA termini. The A1 and M1 segments are normal. There is significant loss of signal the on the MCA bifurcations bilaterally. The vertebral arteries are codominant. The PICA vessels are not seen. The basilar artery is normal. Both posterior cerebral  arteries originate from the basilar tip. There is significant attenuation of PCA branch vessels. IMPRESSION: 1. Both the MRI brain and MRA head are severely degraded by patient motion. 2. No acute intracranial abnormality. 3. Stable atrophy and diffuse white matter disease. 4. Signal loss in the cavernous internal carotid arteries bilaterally likely reflects mild atherosclerotic changes. Stenosis is exaggerated by patient motion. 5. Evaluation of medium and distal small vessels cannot be performed due to the severe motion degradation on the MRA. Electronically Signed   By: San Morelle M.D.   On: 08/12/2015 17:05   I have personally reviewed and evaluated these images and lab results as part of my medical decision-making.   EKG Interpretation   Date/Time:  Friday August 14 2015 15:26:22 EST Ventricular Rate:  76 PR Interval:  174 QRS Duration: 91 QT Interval:  395 QTC Calculation: 444 R Axis:     Text Interpretation:  Sinus rhythm RSR' in V1 or V2, right VCD or RVH no  significant change since 3 days ago Confirmed by [place]  MD, Shirleymae Hauth  ([id number]) on 08/14/2015 3:43:50 PM      MDM   Final diagnoses:  Weakness    After initial workup started family clarifies that they are really here because the home health nurse came to check on the patient today after discharge and states that he cannot be cared for at home alone. They recommended admission to a nursing home. Family called [facility] and was told he had to bring him to the hospital and that he had to be sent from the hospital to the facility. Physical therapy notes from last admission show he was able to and really with a walker for at least 100 feet. Patient will be cannulated here and social work has been consulted. We'll do a workup for weakness and if no significant findings will try to get patient into a nursing home or better home health care. Care transferred to Dr. Vanita Panda. with workup and social work placement  pending.   Sherwood Gambler, MD 08/14/15 1619  I discussed patient's CT with Dr. Armida Sans, Neuro, who feels CT is unlikely to represent hydrocephalus and appears more age related. Also appears unchanged from 3 days ago. Recommends no further workup or treatment and should not be related to his symptoms  Sherwood Gambler, MD 08/14/15 [phone]

## 2015-08-14 NOTE — Progress Notes (Signed)
CSW met with pt, his wife, Dub Mikes, and SIL. Pt and his wife are agreeable to SNF placement at d/c and prefer [facility], as pt has been there in the past.  CSW explained bed search process, as well as the necessity of insurance approval for SNF. Pt's wife cannot take care of pt at home and doesn't believe that he should have been sent home yesterday (12/15) to begin with.  Pt's wife doubts the accuracy of pt's previous PT eval and doesn't believe that he walked at all during his recent admission.  Pt's wife tearful as she spoke about having to put pt in a NH, but realizes she cannot care for him in his current condition.  PT/OT evals pending.  CSW will continue to follow for disposition and will follow up with bed offers as they become available.

## 2015-08-14 NOTE — ED Notes (Signed)
Patient transported to CT 

## 2015-08-14 NOTE — ED Notes (Signed)
Rn spoke to [facility] and SW about family desire for patient placement to [facility].

## 2015-08-14 NOTE — ED Notes (Signed)
Attempt to ambulate pt, two assist. Nurse Hassan Rowan and myself assist the pt to stand. Pt did not take steps he shuffled his feet. Pt sat back down in bed.

## 2015-08-14 NOTE — Discharge Planning (Signed)
NCM consulted regarding SNF placement.  Bedside RN reports that pt was told by [facility] agency to go to hospital for placement.  NCM consulted SW to assist with possible SNF placement.

## 2015-08-14 NOTE — Telephone Encounter (Signed)
Patient was released from the hospital yesterday after a mini stroke. Patient's wife is unable to care for patient by herself. Patient's sister-in-law called and said [facility] came to the house and suggested they take him back to the ER.  She said they'll either take him to [facility] or [facility]. They're hoping he can go to [facility] from the hospital.

## 2015-08-14 NOTE — ED Notes (Addendum)
Pt discharged from hospital last night, wife states is unable to walk and has a gurgling cough-- home health nurse saw pt today, and told to bring here-- pt does have a   Bed at [facility] when discharged-- per family.  Wife states that pt was able to walk when he went into the hospital, but was not able to when discharged/

## 2015-08-14 NOTE — ED Notes (Signed)
Pt attempt to use urinal with assistance. Pt was unable to void at this time.

## 2015-08-14 NOTE — ED Notes (Signed)
Social work at bedside. Plan is for pt to be evaluated by PT on Sat am and then to be approved for [facility] on Monday.

## 2015-08-15 ENCOUNTER — Encounter (HOSPITAL_COMMUNITY): Payer: Self-pay

## 2015-08-15 LAB — CBC WITH DIFFERENTIAL/PLATELET
BASOS ABS: 0 10*3/uL (ref 0.0–0.1)
BASOS PCT: 0 %
EOS PCT: 2 %
Eosinophils Absolute: 0.2 10*3/uL (ref 0.0–0.7)
HCT: 40.4 % (ref 39.0–52.0)
Hemoglobin: 13 g/dL (ref 13.0–17.0)
Lymphocytes Relative: 26 %
Lymphs Abs: 2.3 10*3/uL (ref 0.7–4.0)
MCH: 26.4 pg (ref 26.0–34.0)
MCHC: 32.2 g/dL (ref 30.0–36.0)
MCV: 82.1 fL (ref 78.0–100.0)
MONO ABS: 0.7 10*3/uL (ref 0.1–1.0)
Monocytes Relative: 7 %
Neutro Abs: 5.7 10*3/uL (ref 1.7–7.7)
Neutrophils Relative %: 65 %
PLATELETS: 251 10*3/uL (ref 150–400)
RBC: 4.92 MIL/uL (ref 4.22–5.81)
RDW: 14.7 % (ref 11.5–15.5)
WBC: 8.8 10*3/uL (ref 4.0–10.5)

## 2015-08-15 LAB — URINE MICROSCOPIC-ADD ON: Bacteria, UA: NONE SEEN

## 2015-08-15 LAB — COMPREHENSIVE METABOLIC PANEL
ALBUMIN: 3 g/dL — AB (ref 3.5–5.0)
ALT: 22 U/L (ref 17–63)
AST: 27 U/L (ref 15–41)
Alkaline Phosphatase: 98 U/L (ref 38–126)
Anion gap: 6 (ref 5–15)
BUN: 17 mg/dL (ref 6–20)
CHLORIDE: 95 mmol/L — AB (ref 101–111)
CO2: 31 mmol/L (ref 22–32)
Calcium: 8.7 mg/dL — ABNORMAL LOW (ref 8.9–10.3)
Creatinine, Ser: 1.17 mg/dL (ref 0.61–1.24)
GFR calc Af Amer: >60 mL/min (ref >60)
GFR, EST NON AFRICAN AMERICAN: 54 mL/min — AB (ref >60)
Glucose, Bld: 113 mg/dL — ABNORMAL HIGH (ref 65–99)
POTASSIUM: 3.6 mmol/L (ref 3.5–5.1)
Sodium: 132 mmol/L — ABNORMAL LOW (ref 135–145)
Total Bilirubin: 0.7 mg/dL (ref 0.3–1.2)
Total Protein: 6.2 g/dL — ABNORMAL LOW (ref 6.5–8.1)

## 2015-08-15 LAB — URINALYSIS, ROUTINE W REFLEX MICROSCOPIC
Bilirubin Urine: NEGATIVE
Glucose, UA: NEGATIVE mg/dL
KETONES UR: NEGATIVE mg/dL
LEUKOCYTES UA: NEGATIVE
Nitrite: NEGATIVE
Protein, ur: 100 mg/dL — AB
SPECIFIC GRAVITY, URINE: 1.011 (ref 1.005–1.030)
pH: 7 (ref 5.0–8.0)

## 2015-08-15 MED ORDER — AMLODIPINE BESYLATE 5 MG PO TABS
10.0000 mg | ORAL_TABLET | Freq: Every day | ORAL | Status: DC
  Administered 2015-08-15 – 2015-08-17 (×3): 10 mg via ORAL
  Filled 2015-08-15 (×3): qty 2

## 2015-08-15 NOTE — ED Notes (Signed)
Dr Tyrone Nine in w/pt and family.

## 2015-08-15 NOTE — ED Notes (Signed)
Per Blanch Media, SW, waiting on insurance approval then pt may be transported to SNF.

## 2015-08-15 NOTE — ED Notes (Signed)
Moved pt to C23 and placed on hospital bed for comfort and so pt may be observed from nurses' station. Pt demonstrated use of call bell and voiced understanding on how to use it. Family have returned to bedside and spoke w/Joyce, SW.

## 2015-08-15 NOTE — ED Notes (Addendum)
PT ambulated pt in hallway - sat in recliner back to room. Pt alert, sitting in recliner at this time - demonstrated use of call bell.

## 2015-08-15 NOTE — ED Notes (Signed)
Pt continues to state he wants to leave. Advised pt he is staying overnight. Voiced understanding. Turned pt to right side w/pillows behind back and in between legs for comfort. Head of bed elevated to semi-Fowler's for comfort as well - d/t pt has been noted w/nonprod cough throughout this nurses' shift. RN has been turning pt every 1 - 2 hours throughout the shift.

## 2015-08-15 NOTE — Progress Notes (Signed)
CSW received phone call from facility representative Crystal at [facility] regarding bed availability. Per Crystal, they will be able to take the patient on today once insurance authorization number has been provided. CSW will follow up with facility once number is received.   CSW went to inform family of information. Family was very appreciative of the support provided by CSW. Patient is in room eating and thanked CSW for being helpful. No further CSW needs requested at this time. CSW will follow back up with family and facility once number is received.   Lucius Conn, [facility] Ph: [phone]

## 2015-08-15 NOTE — Evaluation (Signed)
Physical Therapy Evaluation Patient Details Name: [name] MRN: [id number] DOB: Jan 09, 1925 Today's Date: 08/15/2015   History of Present Illness  Pt presents with weakness after just being dc'd from hospital after TIA. PMH - HTN, TIA, CVA, macular degeneration  Clinical Impression  Pt admitted with above diagnosis and presents to PT with functional limitations due to deficits listed below (See PT problem list). Pt needs skilled PT to maximize independence and safety to allow discharge to ST-SNF. Wife has been unable to manage pt at home.     Follow Up Recommendations SNF    Equipment Recommendations  None recommended by PT    Recommendations for Other Services       Precautions / Restrictions Precautions Precautions: Fall      Mobility  Bed Mobility Overal bed mobility: Needs Assistance Bed Mobility: Supine to Sit     Supine to sit: Mod assist;HOB elevated     General bed mobility comments: Assist to bring trunk up  Transfers Overall transfer level: Needs assistance Equipment used: Rolling walker (2 wheeled) Transfers: Sit to/from Stand Sit to Stand: Min assist         General transfer comment: Assist to bring hips up. Had pt place hands on walker to encourage anterior wt shift.  Ambulation/Gait Ambulation/Gait assistance: Min assist Ambulation Distance (Feet): 100 Feet Assistive device: Rolling walker (2 wheeled) Gait Pattern/deviations: Step-through pattern;Decreased step length - right;Decreased step length - left;Shuffle;Trunk flexed   Gait velocity interpretation: <1.8 ft/sec, indicative of risk for recurrent falls General Gait Details: Assist for balance and support. Verbal cues to take ":giant steps" to incr step length with pt responding with incr step length.  Stairs            Wheelchair Mobility    Modified Rankin (Stroke Patients Only)       Balance Overall balance assessment: Needs assistance Sitting-balance support: No  upper extremity supported;Feet supported Sitting balance-Leahy Scale: Fair     Standing balance support: Bilateral upper extremity supported Standing balance-Leahy Scale: Poor Standing balance comment: walker and min A for static standing                             Pertinent Vitals/Pain Pain Assessment: Faces Faces Pain Scale: No hurt    Home Living Family/patient expects to be discharged to:: Skilled nursing facility Living Arrangements: Spouse/significant other Available Help at Discharge: Family;Available 24 hours/day Type of Home: House Home Access: Stairs to enter Entrance Stairs-Rails: Right Entrance Stairs-Number of Steps: 4 Home Layout: One level Home Equipment: Walker - 2 wheels;Cane - single point;Bedside commode;Shower seat;Grab bars - tub/shower;Hand held shower head;Wheelchair - manual Additional Comments: Information from previous admission    Prior Function Level of Independence: Needs assistance   Gait / Transfers Assistance Needed: Amb  ADL's / Homemaking Assistance Needed: Prior to previous admission pt amb with rolling walker with assist from wife to get up. During last adm pt amb 76' with RW with min A. Per chart pt has been unable to amb at home since dc.        Hand Dominance   Dominant Hand: Right    Extremity/Trunk Assessment   Upper Extremity Assessment: Defer to OT evaluation           Lower Extremity Assessment: Generalized weakness      Cervical / Trunk Assessment: Kyphotic  Communication   Communication: Expressive difficulties  Cognition Arousal/Alertness: Awake/alert Behavior During Therapy: WFL for tasks  assessed/performed Overall Cognitive Status: No family/caregiver present to determine baseline cognitive functioning Area of Impairment: Problem solving;Following commands;Attention Orientation Level: Disoriented to;Place;Time;Situation Current Attention Level: Sustained Memory: Decreased short-term  memory Following Commands: Follows one step commands with increased time     Problem Solving: Slow processing      General Comments      Exercises        Assessment/Plan    PT Assessment Patient needs continued PT services  PT Diagnosis Difficulty walking;Abnormality of gait;Generalized weakness   PT Problem List Decreased strength;Decreased activity tolerance;Decreased balance;Decreased mobility;Decreased cognition;Decreased knowledge of use of DME  PT Treatment Interventions DME instruction;Gait training;Functional mobility training;Therapeutic activities;Therapeutic exercise;Balance training;Patient/family education   PT Goals (Current goals can be found in the Care Plan section) Acute Rehab PT Goals Patient Stated Goal: pt unable to state PT Goal Formulation: Patient unable to participate in goal setting Time For Goal Achievement: 08/29/15 Potential to Achieve Goals: Good    Frequency Min 2X/week   Barriers to discharge        Co-evaluation               End of Session Equipment Utilized During Treatment: Gait belt Activity Tolerance: Patient tolerated treatment well Patient left: in chair;with nursing/sitter in room      Functional Assessment Tool Used: Clinical Judgement Functional Limitation: Mobility: Walking and moving around Mobility: Walking and Moving Around Current Status [phone]): At least 20 percent but less than 40 percent impaired, limited or restricted Mobility: Walking and Moving Around Goal Status [phone]): At least 1 percent but less than 20 percent impaired, limited or restricted    Time: 0814-0825 PT Time Calculation (min) (ACUTE ONLY): 11 min   Charges:   PT Evaluation $Initial PT Evaluation Tier I: 1 Procedure     PT G Codes:   PT G-Codes **NOT FOR INPATIENT CLASS** Functional Assessment Tool Used: Clinical Judgement Functional Limitation: Mobility: Walking and moving around Mobility: Walking and Moving Around Current Status  [id number]): At least 20 percent but less than 40 percent impaired, limited or restricted Mobility: Walking and Moving Around Goal Status [phone]): At least 1 percent but less than 20 percent impaired, limited or restricted    [facility] 08/15/2015, 9:04 AM [facility] PT [phone]

## 2015-08-15 NOTE — Progress Notes (Signed)
Facility has informed CSW that they will not be able to take the patient without insurance authorization. CSW informed family of information received. Family does not want patient to go to any other facility stating, patient is very disoriented and that would not make it better. Family is unable to care for patient at home. Case discussed with MD. CSW will continue to follow and provide support to patient while in the hospital.   Lucius Conn, [place] [facility] Ph: [phone]

## 2015-08-15 NOTE — ED Notes (Addendum)
Dr Vanita Panda aware of pt's BP 197/84 and 206/86 - order received for pt's home med - Norvasc 10mg  now and daily.

## 2015-08-15 NOTE — ED Notes (Signed)
Pt given bedside bath and returned to bed. During bath, reddened area noted to top of head which pt states is chronic. Pt noted as incontinent while in chair. Brief applied on return to bed. Pt denies c/o at present.

## 2015-08-15 NOTE — ED Notes (Signed)
Dr Tyrone Nine in w/pt and spouse advising of possible admission pending labs/urine results.

## 2015-08-15 NOTE — NC FL2 (Signed)
[place] LEVEL OF CARE SCREENING TOOL     IDENTIFICATION  Patient Name: [name] Birthdate: 09-Sep-1924 Sex: male Admission Date (Current Location): 08/14/2015  [facility] and [place] Number:     Facility and Address:  [facility], [place] [address] [place], [place] [address]      Provider Number: [id number]  Attending Physician Name and Address:  Provider Default, MD  Relative Name and Phone Number:       Current Level of Care: Hospital Recommended Level of Care: Nursing Facility Prior Approval Number:    Date Approved/Denied: 08/14/15 PASRR Number: [id number]  Discharge Plan: SNF    Current Diagnoses: Patient Active Problem List   Diagnosis Date Noted  . CVA (cerebral infarction) 08/11/2015  . Subcapital fracture of hip ([place]) 03/27/2015  . Hip fracture ([place]) 03/27/2015  . Esophageal reflux 03/25/2015  . Memory change 12/01/2014  . C. difficile colitis 09/03/2014  . Weakness 08/08/2014  . Acute respiratory disease 08/01/2014  . Atrial fibrillation with RVR ([place]) 08/01/2014  . Diastolic dysfunction [id number]  . Essential hypertension 08/01/2014  . Lactic acidosis 08/01/2014  . Respiratory distress 07/31/2014  . Acute on chronic diastolic heart failure ([place]) 12/20/2013  . Dysphagia 12/17/2013  . CAP (community acquired pneumonia) 12/12/2013  . Sepsis ([place]) 12/12/2013  . Abnormality of gait 01/18/2013  . Cough 01/18/2013  . Routine general medical examination at a health care facility 05/31/2012  . Esophagitis 10/26/2011  . Chest wall pain 08/30/2011  . TIA (transient ischemic attack) 03/23/2011  . B12 deficiency 03/23/2011  . Esophageal dysmotility 10/28/2010  . Other urinary incontinence 10/28/2010  . TOBACCO ABUSE 04/26/2010  . PNEUMONIA, BILATERAL 02/08/2009  . HYPERCHOLESTEROLEMIA 07/14/2008  . ABDOMINAL BRUIT 07/14/2008    Orientation RESPIRATION BLADDER Height & Weight    Situation, Place, Time, Self  (pt has intermittent confusion dur to underlying dementia)  Normal Continent 5\' 6"  (167.6 cm) 137 lbs.  BEHAVIORAL SYMPTOMS/MOOD NEUROLOGICAL BOWEL NUTRITION STATUS      Continent  (DYS 3 thin liquids)  AMBULATORY STATUS COMMUNICATION OF NEEDS Skin   Extensive Assist Verbally Normal                       Personal Care Assistance Level of Assistance  Bathing, Dressing Bathing Assistance: Maximum assistance   Dressing Assistance: Maximum assistance     Functional Limitations Info             SPECIAL CARE FACTORS FREQUENCY  PT (By licensed PT), OT (By licensed OT)     PT Frequency: 3xs a week OT Frequency: 3xs a week            Contractures Contractures Info: Not present    Additional Factors Info  Code Status, Allergies Code Status Info: Prior Allergies Info: No Known Allergies            Current Medications (08/15/2015):  This is the current hospital active medication list Current Facility-Administered Medications  Medication Dose Route Frequency Provider Last Rate Last Dose  . albuterol (PROVENTIL HFA;VENTOLIN HFA) 108 (90 BASE) MCG/ACT inhaler 2 puff  2 puff Inhalation Q6H PRN Carmin Muskrat, MD      . aspirin EC tablet 325 mg  325 mg Oral Daily Carmin Muskrat, MD   325 mg at 08/14/15 2019  . docusate sodium (COLACE) capsule 100 mg  100 mg Oral BID Carmin Muskrat, MD   100 mg at 08/14/15 2200  . HYDROcodone-acetaminophen (NORCO/VICODIN) 5-325 MG  per tablet 1 tablet  1 tablet Oral Q6H PRN Carmin Muskrat, MD      . multivitamin (PROSIGHT) tablet 1 tablet  1 tablet Oral BID Carmin Muskrat, MD   1 tablet at 08/14/15 2200  . pantoprazole (PROTONIX) EC tablet 40 mg  40 mg Oral Daily Carmin Muskrat, MD   40 mg at 08/14/15 2019  . simvastatin (ZOCOR) tablet 20 mg  20 mg Oral QHS Carmin Muskrat, MD   20 mg at 08/14/15 2200  . tobramycin (TOBREX) 0.3 % ophthalmic solution 1 drop  1 drop Both Eyes BID Carmin Muskrat, MD   1 drop at 08/14/15 2200   Current  Outpatient Prescriptions  Medication Sig Dispense Refill  . albuterol (PROVENTIL HFA;VENTOLIN HFA) 108 (90 BASE) MCG/ACT inhaler Inhale 2 puffs into the lungs every 6 (six) hours as needed for wheezing or shortness of breath. 1 Inhaler 5  . amLODipine (NORVASC) 10 MG tablet Take 1 tablet (10 mg total) by mouth daily. 90 tablet 3  . aspirin EC 325 MG tablet Take 1 tablet (325 mg total) by mouth daily.    Marland Kitchen docusate sodium (COLACE) 100 MG capsule Take 1 capsule (100 mg total) by mouth 2 (two) times daily.    Marland Kitchen HYDROcodone-acetaminophen (NORCO) 5-325 MG tablet Take 1 tablet by mouth every 6 (six) hours as needed. 40 tablet 0  . Multiple Vitamins-Minerals (PRESERVISION AREDS 2) CAPS Take 1 tablet by mouth 2 (two) times daily. Take as directed.    . NON FORMULARY Oxygen 2 liters as needed    . pantoprazole (PROTONIX) 40 MG tablet Take 40 mg by mouth daily.    . simvastatin (ZOCOR) 20 MG tablet Take 20 mg by mouth daily.    Marland Kitchen tobramycin (TOBREX) 0.3 % ophthalmic solution Place 1 drop into both eyes 2 (two) times daily.   4     Discharge Medications: Please see discharge summary for a list of discharge medications.  Relevant Imaging Results:  Relevant Lab Results:   Additional Information Social Security Number [id number]  Raymondo Band, LCSW

## 2015-08-15 NOTE — ED Notes (Signed)
Pt found at end of stretcher after family had left for lunch. Pt cleaned with new brief applied and new linen. Pt reoriented to room, safety side rails and call system. Family returns to room.

## 2015-08-15 NOTE — Progress Notes (Signed)
CSW introduced self and acknowledged the patient. Patient's wife Dub Mikes and sister Vickii Chafe is at bedside. Wife informed CSW that they would like for the patient to go to a skilled nursing facility once discharged, preferably [facility]. Wife informed CSW that she has been in contact with [facility] regarding bed availability, and was told they would have a bed for patient. CSW informed wife that CSW will follow up with facility regarding this information. CSW was also provided permission for patient to be faxed out to [facility] in [facility]. However, family voiced that they really would like the patient to only go to [facility]. No further CSW needs were requested at this time. Family is aware of CSW process. CSW to complete FL2 for MD signature and fax clinicals to SNFs. CSW will also submit for insurance authorization for patient.   Lucius Conn, [facility] Ph: [phone]

## 2015-08-16 NOTE — Progress Notes (Signed)
CSW spoke with facility representative Crystal from [facility]. Patient will have a bed on Monday. Awaiting insurance authorization. CSW will continue to provide support for the patient and family while in [place], [place] [facility] Ph: [phone]

## 2015-08-16 NOTE — ED Notes (Signed)
Pt brief and incontinence pads due to being soiled with urine during rounds.

## 2015-08-16 NOTE — Progress Notes (Signed)
OT Evaluation  Pt requires Mod A with mobility @ times and mod A with ADL. Pt will benefit from skilled OT services to maximize functional level of independence and decrease burden of care. Pt will benefit from rehab at SNF. Ambulated @ 165ft with OT @ RW level with min A.   08/16/15 0800  OT Visit Information  Last OT Received On 08/16/15  Assistance Needed +1  History of Present Illness Pt presents with weakness after just being dc'd from hospital after TIA. PMH - HTN, TIA, CVA, macular degeneration  Precautions  Precautions Fall  Home Living  Family/patient expects to be discharged to: Skilled nursing facility  Prior Function  Level of Independence Needs assistance  Gait / Transfers Assistance Needed Amb  ADL's / Homemaking Assistance Needed previously ambulatted with RW with assist from wife. Just PTA, pt unable to ambulate.  Pain Assessment  Pain Assessment Faces  Faces Pain Scale 0  Cognition  Arousal/Alertness Awake/alert  Behavior During Therapy WFL for tasks assessed/performed  Overall Cognitive Status No family/caregiver present to determine baseline cognitive functioning  Orientation Level Disoriented to;Time  Current Attention Level Selective  Memory Decreased short-term memory  Following Commands Follows one step commands consistently  Safety/Judgement Decreased awareness of safety;Decreased awareness of deficits  Awareness Emergent  Problem Solving Slow processing  Upper Extremity Assessment  Upper Extremity Assessment Generalized weakness  Lower Extremity Assessment  Lower Extremity Assessment Generalized weakness  Cervical / Trunk Assessment  Cervical / Trunk Assessment Kyphotic  ADL  Overall ADL's  Needs assistance/impaired  Eating/Feeding Details (indicate cue type and reason) dysphagia 3 diet  Grooming Set up;Supervision/safety  Upper Body Bathing Supervision/ safety;Set up;Sitting  Lower Body Bathing Moderate assistance;Sit to/from stand  Upper Body  Dressing  Moderate assistance  Lower Body Dressing Moderate assistance  Toilet Transfer Minimal assistance;Ambulation  Toileting- Clothing Manipulation and Hygiene Maximal assistance  Toileting - Clothing Manipulation Details (indicate cue type and reason) incontinent  Functional mobility during ADLs Minimal assistance to ambulate @ 120 ft. vc for upright posture and taking "big steps" due to shuffling gait pattern. Mod A to step backwards.Rolling walker;Cueing for safety;Cueing for sequencing  Vision- History  Baseline Vision/History Macular Degeneration;Wears glasses  Patient Visual Report No change from baseline  Bed Mobility  General bed mobility comments OOB in chari  Transfers  Overall transfer level Needs assistance  Equipment used Rolling walker (2 wheeled)  Transfers Sit to/from [facility]  Sit to Stand Min assist  Stand pivot transfers Min assist  General transfer comment Mod to A to step backwards due to posterior lean  Balance  Sitting balance-Leahy Scale Fair  Standing balance-Leahy Scale Poor  OT - End of Session  Equipment Utilized During Treatment Gait belt;Rolling walker  Activity Tolerance Patient tolerated treatment well  Patient left in chair;with call bell/phone within reach  Nurse Communication Mobility status  OT Assessment  OT Therapy Diagnosis  Generalized weakness;Cognitive deficits  OT Recommendation/Assessment Patient needs continued OT Services  OT Problem List Decreased strength;Decreased activity tolerance;Impaired balance (sitting and/or standing);Decreased cognition;Decreased safety awareness;Decreased knowledge of use of DME or AE  Barriers to Discharge Decreased caregiver support  OT Plan  OT Frequency (ACUTE ONLY) Min 2X/week  OT Treatment/Interventions (ACUTE ONLY) Self-care/ADL training;Therapeutic exercise;DME and/or AE instruction;Therapeutic activities;Cognitive remediation/compensation;Patient/family education;Balance  training  OT Recommendation  Follow Up Recommendations SNF;Supervision/Assistance - 24 hour  OT Equipment Other (comment) (TBA at SNF)  Individuals Consulted  Consulted and Agree with Results and Recommendations Patient  Acute Rehab OT  Goals  Patient Stated Goal to get better  OT Goal Formulation With patient  Time For Goal Achievement 08/30/15  Potential to Achieve Goals Good  OT Time Calculation  OT Start Time (ACUTE ONLY) 0837  OT Stop Time (ACUTE ONLY) 0901  OT Time Calculation (min) 24 min  OT G-codes **NOT FOR INPATIENT CLASS**  Functional Assessment Tool Used clinical judgement  Functional Limitation Self care  Self Care Current Status [id number]) CK  Self Care Goal Status [id number]) CI  OT General Charges  $OT Visit 1 Procedure  OT Evaluation  $Initial OT Evaluation Tier I 1 Procedure  OT Treatments  $Self Care/Home Management  8-22 mins  Written Expression  Dominant Hand Right  Maurie Boettcher, OTR/L  [phone] 08/16/2015

## 2015-08-16 NOTE — ED Notes (Signed)
Turned pt to left side w/pillows between legs and behind back for comfort.

## 2015-08-16 NOTE — Telephone Encounter (Signed)
Noted.  Please call on Monday to see how they are doing.  Thanks.

## 2015-08-16 NOTE — ED Notes (Signed)
Pt used call bell to advise RN has finished eating breakfast.

## 2015-08-16 NOTE — ED Notes (Signed)
Pt sitting in recliner at bedside - warm blankets given.

## 2015-08-17 NOTE — ED Notes (Signed)
Lunch tray delivered.

## 2015-08-17 NOTE — ED Notes (Signed)
Family at bedside visiting with pt.

## 2015-08-17 NOTE — ED Notes (Signed)
Pt rolled to right side, pillows underneath for comfort.

## 2015-08-17 NOTE — ED Notes (Signed)
Pt wife called, updated on Plan of care.  Aware no bed at this time.

## 2015-08-17 NOTE — Progress Notes (Addendum)
CSW received call from H[place] Candal at [facility] with authorization #. Authorization # - [id number] with admit date 08/17/2015. CSW called [facility] [phone]) to provide authorization number. They have a bed available for Patient. [facility] to contact Patient's wife to arrange for consent signatures at facility and will contact CSW when ready for transport. CSW will continue to follow.   Holly Bodily, [place]

## 2015-08-17 NOTE — Discharge Instructions (Signed)

## 2015-08-17 NOTE — ED Notes (Signed)
MD Pfeiffer aware pt has bed and ready for d/c to [facility].

## 2015-08-17 NOTE — Telephone Encounter (Signed)
Patient is at the hospital, likely being released to [facility] today.  Wife is very upset and says she cannot take care of him at home because he cannot walk a step.  Wife says she tried to do it one night and he slipped into the floor and she doesn't have the strength to handle him.  Wife is concerned about his medications and says she needs an up-to-date list of his medicines.  I told her that a list is printed each time he comes to the MD and that the hospital will send a list to [facility].  Wife is very confused and emotional.  I asked if any family members could help to handle his medications and she says her sister has been helping but she has cancer.  When and if he comes home, wife will need help with administering medications and physically handling the patient.

## 2015-08-17 NOTE — ED Provider Notes (Signed)
Patient reexamined prior to discharge. Patient denies any complaints. He is alert and answering questions appropriately. No acute distress. Heart is regular. Radial pulses are 2+. Respirations are nonlabored. Patient does have occasional rhonchi with auscultation. No lower extremity edema. Skin is warm and dry. Vital signs have been reviewed and remained stable. Patient has had chest x-ray, CT head and serum labs performed over the past 8 days. No acute anomalies.  Patient has been accepted for nursing home placement.  Charlesetta Shanks, MD 08/17/15 [phone]

## 2015-08-17 NOTE — ED Notes (Signed)
Breakfast tray set up for patient.  Pt eating at this time.

## 2015-08-17 NOTE — ED Notes (Signed)
MD Pfeiffer at bedside ° °

## 2015-08-17 NOTE — ED Notes (Signed)
[facility] called via [place].

## 2015-08-17 NOTE — ED Notes (Signed)
Report called to [place] at [facility], wife already at facility and aware pt will be transported to facility.

## 2015-08-17 NOTE — ED Provider Notes (Signed)
  Physical Exam  BP 152/71 mmHg  Pulse 71  Temp(Src) 98.4 F (36.9 C) (Oral)  Resp 16  Ht 5\' 6"  (1.676 m)  SpO2 94%  Physical Exam  ED Course  Procedures  MDM Patient sleeping complaint this time. Reportedly has been awake much of the night. Likely placement to nursing home today.      Davonna Belling, MD 08/17/15 [phone]

## 2015-08-17 NOTE — Progress Notes (Signed)
[facility] arranged to transport pt to [facility]  CSW to call pt's wife, Dub Mikes, once pt leaves the building.

## 2015-08-18 ENCOUNTER — Non-Acute Institutional Stay (SKILLED_NURSING_FACILITY): Payer: Medicare Other | Admitting: Internal Medicine

## 2015-08-18 ENCOUNTER — Other Ambulatory Visit: Payer: Self-pay | Admitting: *Deleted

## 2015-08-18 DIAGNOSIS — E46 Unspecified protein-calorie malnutrition: Secondary | ICD-10-CM | POA: Diagnosis not present

## 2015-08-18 DIAGNOSIS — K219 Gastro-esophageal reflux disease without esophagitis: Secondary | ICD-10-CM | POA: Diagnosis not present

## 2015-08-18 DIAGNOSIS — R5381 Other malaise: Secondary | ICD-10-CM

## 2015-08-18 DIAGNOSIS — I10 Essential (primary) hypertension: Secondary | ICD-10-CM | POA: Diagnosis not present

## 2015-08-18 DIAGNOSIS — E785 Hyperlipidemia, unspecified: Secondary | ICD-10-CM

## 2015-08-18 DIAGNOSIS — R1310 Dysphagia, unspecified: Secondary | ICD-10-CM | POA: Diagnosis not present

## 2015-08-18 DIAGNOSIS — R0989 Other specified symptoms and signs involving the circulatory and respiratory systems: Secondary | ICD-10-CM | POA: Diagnosis not present

## 2015-08-18 DIAGNOSIS — G459 Transient cerebral ischemic attack, unspecified: Secondary | ICD-10-CM | POA: Diagnosis not present

## 2015-08-18 DIAGNOSIS — K5900 Constipation, unspecified: Secondary | ICD-10-CM

## 2015-08-18 DIAGNOSIS — H109 Unspecified conjunctivitis: Secondary | ICD-10-CM | POA: Diagnosis not present

## 2015-08-18 MED ORDER — HYDROCODONE-ACETAMINOPHEN 5-325 MG PO TABS: ORAL_TABLET | ORAL | Status: DC

## 2015-08-18 NOTE — Progress Notes (Signed)
Patient ID: [name], male   DOB: 01/08/1925, 81 y.o.   MRN: [id number]     Facility: [facility]    PCP: [name], MD  Code Status:  DNR  No Known Allergies  Chief Complaint  Patient presents with  . New Admit To SNF     HPI:  81 y.o. patient is here for short term rehabilitation post hospital admission from 08/11/15-08/13/15 and ED stay from 08/14/15-08/17/15. He was admitted with transient aphasia with concerns of TIA. Neurology was consulted. Ct head and MRI brain ruled out acute intracranial process. Severe carotid stenosis was ruled out. Echocardiogram showed EF 55-60% with grade 1 diastolic dysfunction. EEG showed mild encephalopathic changes but no seizure activity. His full strength aspirin was continued and he was evaluated by therapy team. He had episode of agitation in the hospital and responded well to a dose of im haldol. Family wanted to take patient home and he was discharged home. He was in the ED the next day with increased confusion and difficulty in providing care at home. He was kept under observation in the ED until a placement was found. He has past medical history of HTN, HLD, TIA/stroke, GERD, possible underlying dementia among others. He is seen in his room today. His wife is present at bedside. He has been coughing during this visit and brings out phlegm. He denies dyspnea. He is currently on dysphagia diet and had some of his breakfast this am.   Review of Systems:  Constitutional: Negative for fever, chills, diaphoresis.  HENT: Negative for headache, congestion, nasal discharge, difficulty swallowing.   Eyes: Negative for blurred vision, double vision and discharge.  Respiratory: Negative for wheezing.   Cardiovascular: Negative for chest pain, palpitations, leg swelling.  Gastrointestinal: Negative for heartburn, nausea, vomiting, abdominal pain. Had bowel movement yesterday Genitourinary: Negative for dysuria.    Musculoskeletal: Negative for back pain, falls. Skin: Negative for itching, rash.  Neurological: Negative for dizziness, tingling. Has generalized weakness Psychiatric/Behavioral: Negative for depression..    Past Medical History  Diagnosis Date  . Hypertension   . Hyperlipidemia   . Tobacco abuse   . Lung nodule     Left lung, seen 2012, no change in 2012, no follow up needed   . PNA (pneumonia) 2010    Bilateral Pneumonia, COPD 63mm LLL Nodule   . History of ETT 09/1991     POS   . History of MRI 04-22-06    L/S- right hnp  l5/si   . B12 deficiency   . TIA (transient ischemic attack)   . GERD (gastroesophageal reflux disease)   . Esophageal stricture   . Hemorrhoids   . Colon polyps     hyperplastic  . Pneumonia   . Macular degeneration   . Stroke [facility])    Past Surgical History  Procedure Laterality Date  . Cystectomy  1995    Lumbar area   . Hip pinning,cannulated Left 03/28/2015    Procedure: CANNULATED HIP PINNING;  Surgeon: [name], MD;  Location: [facility];  Service: Orthopedics;  Laterality: Left;   Social History:   reports that he quit smoking about 29 years ago. His smoking use included Cigarettes. He has quit using smokeless tobacco. His smokeless tobacco use included Chew. He reports that he does not drink alcohol or use illicit drugs.  Family History  Problem Relation Age of Onset  . Hypertension Mother   . Heart attack Father   . Colon cancer Neg  Hx   . Esophageal cancer Neg Hx     Medications:   Medication List       This list is accurate as of: 08/18/15  9:44 AM.  Always use your most recent med list.               albuterol 108 (90 BASE) MCG/ACT inhaler  Commonly known as:  PROVENTIL HFA;VENTOLIN HFA  Inhale 2 puffs into the lungs every 6 (six) hours as needed for wheezing or shortness of breath.     amLODipine 10 MG tablet  Commonly known as:  NORVASC  Take 1 tablet (10 mg total) by mouth daily.     aspirin EC 325 MG tablet  Take  1 tablet (325 mg total) by mouth daily.     docusate sodium 100 MG capsule  Commonly known as:  COLACE  Take 1 capsule (100 mg total) by mouth 2 (two) times daily.     HYDROcodone-acetaminophen 5-325 MG tablet  Commonly known as:  NORCO  Take 1 tablet by mouth every 6 (six) hours as needed.     pantoprazole 40 MG tablet  Commonly known as:  PROTONIX  Take 40 mg by mouth at bedtime.     PRESERVISION AREDS 2 Caps  Take 1 tablet by mouth 2 (two) times daily. Take as directed.     simvastatin 20 MG tablet  Commonly known as:  ZOCOR  Take 20 mg by mouth daily.     tobramycin 0.3 % ophthalmic solution  Commonly known as:  TOBREX  Place 1 drop into both eyes 4 (four) times daily.         Physical Exam: Filed Vitals:   08/18/15 0935  BP: 149/75  Pulse: 86  Temp: 97.3 F (36.3 C)  Resp: 18  SpO2: 95%    General- elderly male, frail and thin built, in no acute distress Head- normocephalic, atraumatic Nose- normal nasal mucosa, no maxillary or frontal sinus tenderness, no nasal discharge Throat- moist mucus membrane, gurgling + Eyes- PERRLA, EOMI, no pallor, no icterus, red conjunctiva, some ectropion +  Neck- no cervical lymphadenopathy, no JVD Chest- no chest wall deformities, no chest wall tenderness Cardiovascular- normal s1,s2, no murmurs, palpable dorsalis pedis and radial pulses, no leg edema Respiratory- bilateral poor air entry, scattered rhonchi +, no wheeze, no use of accessory muscles Abdomen- bowel sounds present, soft, non tender Musculoskeletal- able to move all 4 extremities, generalized weakness with lower extremity weakness > upper extremity  Neurological- no focal deficit, alert and oriented to person, place and time Skin- warm and dry Psychiatry- normal mood and affect    Labs reviewed: Basic Metabolic Panel:  Recent Labs  08/11/15 1411 08/14/15 1441 08/14/15 1450 08/15/15 1733  NA 134* 134* 135 132*  K 4.0 3.8 3.8 3.6  CL 96* 96* 93* 95*   CO2 30 30  --  31  GLUCOSE 92 95 91 113*  BUN 18 17 22* 17  CREATININE 1.15 1.22 1.10 1.17  CALCIUM 9.0 8.9  --  8.7*   Liver Function Tests:  Recent Labs  08/11/15 1411 08/14/15 1441 08/15/15 1733  AST 20 30 27   ALT 14* 20 22  ALKPHOS 100 100 98  BILITOT 0.8 0.5 0.7  PROT 7.2 6.3* 6.2*  ALBUMIN 3.8 3.2* 3.0*   No results for input(s): LIPASE, AMYLASE in the last 8760 hours. No results for input(s): AMMONIA in the last 8760 hours. CBC:  Recent Labs  08/11/15 1411 08/14/15 1441 08/14/15 1450  08/15/15 1733  WBC 7.6 9.1  --  8.8  NEUTROABS 4.8 6.1  --  5.7  HGB 13.4 13.7 15.6 13.0  HCT 41.2 42.2 46.0 40.4  MCV 83.7 82.3  --  82.1  PLT 262 262  --  251    Radiological Exams: Dg Chest 2 View  08/14/2015  CLINICAL DATA:  Cough and weakness Xfew days; hx HTN, ex-smoker EXAM: CHEST  2 VIEW COMPARISON:  08/11/2015 FINDINGS: Lateral view degraded, including secondary to arm position. Osteopenia. Patient rotated left on the frontal radiograph. Mild cardiomegaly with transverse aortic atherosclerosis. No pleural effusion or pneumothorax. Bibasilar scarring or atelectasis. No congestive failure. IMPRESSION: Cardiomegaly and aortic atherosclerosis, without acute disease. Electronically Signed   By: Abigail Miyamoto M.D.   On: 08/14/2015 15:01   Ct Head Wo Contrast  08/14/2015  CLINICAL DATA:  Gait disturbance ; altered mental status EXAM: CT HEAD WITHOUT CONTRAST TECHNIQUE: Contiguous axial images were obtained from the base of the skull through the vertex without intravenous contrast. COMPARISON:  August 11, 2015 head CT; December 14 26 brain MRI FINDINGS: Moderate diffuse atrophy is stable. There is no intracranial mass, hemorrhage, extra-axial fluid collection, or midline shift. There is small vessel disease in the centra semiovale bilaterally, stable. There is evidence of a prior focal lacunar infarct in the mid right centrum semiovale, stable. There is also a prior lacunar type  infarct in the posterior limb of the left internal capsule, stable. No new gray-white compartment lesions are evident. No acute infarct evident. Bony calvarium appears intact. Mastoid air cells are clear. No intraorbital lesions are identified. IMPRESSION: Stable diffuse atrophy. The configuration of the ventricles raises question of possible concomitant communicating hydrocephalus. There is supratentorial small vessel disease, stable. No acute infarct evident. No hemorrhage or mass effect. Electronically Signed   By: Lowella Grip III M.D.   On: 08/14/2015 15:29   08/13/15 EEG EEG Abnormalities: 1) FIRDA 2) Generalized irregular slow activity Clinical Interpretation: This EEG is consistent with a generalized non-specific cerebral dysfunction(encephalopathy). There was no seizure or seizure predisposition recorded on this study.    2 D Echo: Study Conclusions Left ventricle: The cavity size was normal. Wall thickness was   increased in a pattern of moderate LVH. Systolic function was   normal. The estimated ejection fraction was in the range of 55%   to 60%. Wall motion was normal; there were no regional wall   motion abnormalities. Doppler parameters are consistent with   abnormal left ventricular relaxation (grade 1 diastolic   dysfunction).   Assessment/Plan  Physical deconditioning With recent TIA and UTI. Has completed antibiotics. To work with therapy team for gait training and muscle strengthening exercises.   TIA Recent TIA. Continue aspirin 325 mg daily and zocor 20 mg daily. Will have him work with physical therapy and occupational therapy team to help with gait training and muscle strengthening exercises.fall precautions. Skin care. Encourage to be out of bed.   Cough With rhonchi on lung exam, has hx of chf and tobacco use. Concern for aspiration at present with aspiration pneumonitis. Will get cxr pa/laterl view to assess further. Strict aspiration precautions. Will need  SLP to evaluate further. Continue prn proventil for now. Add mucinex   Dysphagia Will get SLP consult. Will need assistance with feed for now.   Protein calorie malnutrition Monitor po intake and weight. Dietary consult for now. Continue MVI. Pressure ulcer prophylaxis.   HTN Elevated SBP. Currently on amlodipine 10 mg daily. Check bp  q shift for 5 days. Will need to review bp readings and adjust medication if > 3 reading > 140/90. Check bmp  HLD Continue zocor 20 mg daily  Constipation Continue colace 100 mg bid, hydration to be maintained  gerd Continue protonix 40 mg daily  Conjunctivitis Continue tobramycin eye drop for now   Goals of care: short term rehabilitation   Labs/tests ordered: cbc, cmp, cxr  Family/ staff Communication: reviewed care plan with patient, his wife and nursing supervisor    Blanchie Serve, MD  [place] [phone] (Monday-Friday 8 am - 5 pm) [phone] (afterhours)

## 2015-08-18 NOTE — Telephone Encounter (Signed)
[facility] 

## 2015-08-19 NOTE — Telephone Encounter (Signed)
Called and LMOVM for pt/spouse.  I asked them to update me as needed, no confidential info left on the message.

## 2015-08-20 NOTE — Telephone Encounter (Signed)
PLEASE NOTE: All timestamps contained within this report are represented as [place] Standard Time. CONFIDENTIALTY NOTICE: This fax transmission is intended only for the addressee. It contains information that is legally privileged, confidential or otherwise protected from use or disclosure. If you are not the intended recipient, you are strictly prohibited from reviewing, disclosing, copying using or disseminating any of this information or taking any action in reliance on or regarding this information. If you have received this fax in error, please notify us immediately by telephone so that we can arrange for its return to [place]. Phone: [phone], Toll-Free: [phone], Fax: [phone] Page: 1 of 1 Call Id: [id number] [place] Patient Name: [name] Gender: Unknown DOB: (approximate) Age: Return Phone Number: Address: City/State/Zip: [place] Client [place] - Client Client Site [place] Type Call Caller Name [place] Phone Number [phone] Relationship To Patient Self Is this call to report lab results? No Call Type General Information Initial Comment Caller states returning a call. General Information Type Message Only Nurse Assessment Guidelines Guideline Title Affirmed Question Affirmed Notes Nurse Date/Time (Eastern Time) Disp. Time Eilene Ghazi Time) Disposition Final User 08/19/2015 7:55:27 PM General Information Provided Yes Barnie Mort After Care Instructions Given Call Event Type User Date / Time Description

## 2015-08-20 NOTE — Telephone Encounter (Addendum)
Mrs Hagie said she is doing OK but pt is not doing well; pt cannot do anything for himself; pt cannot talk to communicate; pt is at [facility] and things are going OK there;Mrs Kivi will keep Dr Damita Dunnings updated if any changes.this is FYI to Dr Damita Dunnings.

## 2015-08-24 NOTE — Telephone Encounter (Signed)
Noted. Thanks.

## 2015-09-02 ENCOUNTER — Non-Acute Institutional Stay (SKILLED_NURSING_FACILITY): Payer: Medicare Other | Admitting: Nurse Practitioner

## 2015-09-02 DIAGNOSIS — G459 Transient cerebral ischemic attack, unspecified: Secondary | ICD-10-CM | POA: Diagnosis not present

## 2015-09-02 DIAGNOSIS — E46 Unspecified protein-calorie malnutrition: Secondary | ICD-10-CM | POA: Diagnosis not present

## 2015-09-02 DIAGNOSIS — R1310 Dysphagia, unspecified: Secondary | ICD-10-CM | POA: Diagnosis not present

## 2015-09-02 DIAGNOSIS — I10 Essential (primary) hypertension: Secondary | ICD-10-CM

## 2015-09-02 DIAGNOSIS — H109 Unspecified conjunctivitis: Secondary | ICD-10-CM | POA: Diagnosis not present

## 2015-09-02 DIAGNOSIS — K219 Gastro-esophageal reflux disease without esophagitis: Secondary | ICD-10-CM

## 2015-09-02 NOTE — Progress Notes (Signed)
Patient ID: [name], male   DOB: 11/24/1924, 82 y.o.   MRN: [id number]    Nursing Home Location:  [place] of Service: SNF ([phone])  PCP: [name], MD  No Known Allergies  Chief Complaint  Patient presents with  . Discharge Note    HPI:  Patient is a 82 y.o. male seen today at [facility] for discharge home with family.  He has past medical history of HTN, HLD, TIA/stroke, GERD, possible underlying dementia. Pt is at [facility] for short term rehabilitation after hospitalization from 08/11/15-08/13/15 and ED stay from 08/14/15-08/17/15. He was admitted with transient aphasia with concerns of TIA. Neurology was consulted. Ct head and MRI brain ruled out acute intracranial process. Severe carotid stenosis was ruled out. Echocardiogram showed EF 55-60% with grade 1 diastolic dysfunction. EEG showed mild encephalopathic changes but no seizure activity. His full strength aspirin was continued. originally pt was taken home with family however the next day with increased confusion and difficulty in providing care at home and went to the ED where he then transitioned to [facility] for rehab. He has been doing better since admission. No acute issues since pt has been at [facility]. Patient currently doing well with therapy, now stable to discharge home with home health.  Review of Systems:  Review of Systems  Constitutional: Negative for activity change, appetite change, fatigue and unexpected weight change.  HENT: Negative for congestion and hearing loss.   Eyes: Negative.   Respiratory: Positive for cough (chronic). Negative for shortness of breath.   Cardiovascular: Negative for chest pain, palpitations and leg swelling.  Gastrointestinal: Negative for abdominal pain, diarrhea and constipation.  Genitourinary: Negative for dysuria and difficulty urinating.  Musculoskeletal: Negative for myalgias and arthralgias.  Skin: Negative for  color change and wound.  Neurological: Positive for weakness (generalized). Negative for dizziness.  Psychiatric/Behavioral: Negative for behavioral problems, confusion and agitation.    Past Medical History  Diagnosis Date  . Hypertension   . Hyperlipidemia   . Tobacco abuse   . Lung nodule     Left lung, seen 2012, no change in 2012, no follow up needed   . PNA (pneumonia) 2010    Bilateral Pneumonia, COPD 24mm LLL Nodule   . History of ETT 09/1991     POS   . History of MRI 04-22-06    L/S- right hnp  l5/si   . B12 deficiency   . TIA (transient ischemic attack)   . GERD (gastroesophageal reflux disease)   . Esophageal stricture   . Hemorrhoids   . Colon polyps     hyperplastic  . Pneumonia   . Macular degeneration   . Stroke [facility])    Past Surgical History  Procedure Laterality Date  . Cystectomy  1995    Lumbar area   . Hip pinning,cannulated Left 03/28/2015    Procedure: CANNULATED HIP PINNING;  Surgeon: [name], MD;  Location: [facility];  Service: Orthopedics;  Laterality: Left;   Social History:   reports that he quit smoking about 30 years ago. His smoking use included Cigarettes. He has quit using smokeless tobacco. His smokeless tobacco use included Chew. He reports that he does not drink alcohol or use illicit drugs.  Family History  Problem Relation Age of Onset  . Hypertension Mother   . Heart attack Father   . Colon cancer Neg Hx   . Esophageal cancer Neg Hx  Medications: Patient's Medications  New Prescriptions   No medications on file  Previous Medications   ALBUTEROL (PROVENTIL HFA;VENTOLIN HFA) 108 (90 BASE) MCG/ACT INHALER    Inhale 2 puffs into the lungs every 6 (six) hours as needed for wheezing or shortness of breath.   AMLODIPINE (NORVASC) 10 MG TABLET    Take 1 tablet (10 mg total) by mouth daily.   ASPIRIN EC 325 MG TABLET    Take 1 tablet (325 mg total) by mouth daily.   DOCUSATE SODIUM (COLACE) 100 MG CAPSULE    Take 1 capsule (100 mg  total) by mouth 2 (two) times daily.   HYDROCODONE-ACETAMINOPHEN (NORCO) 5-325 MG TABLET    Take one tablet by mouth every 6 hours as needed for pain. Do not exceed 4gm of Tylenol in 24 hours   MULTIPLE VITAMINS-MINERALS (PRESERVISION AREDS 2) CAPS    Take 1 tablet by mouth 2 (two) times daily. Take as directed.   PANTOPRAZOLE (PROTONIX) 40 MG TABLET    Take 40 mg by mouth at bedtime.    SIMVASTATIN (ZOCOR) 20 MG TABLET    Take 20 mg by mouth daily.   TOBRAMYCIN (TOBREX) 0.3 % OPHTHALMIC SOLUTION    Place 1 drop into both eyes 4 (four) times daily.   Modified Medications   No medications on file  Discontinued Medications   No medications on file     Physical Exam: Filed Vitals:   09/02/15 1324  BP: 148/81  Pulse: 76  Temp: 97.1 F (36.2 C)  Resp: 18  Weight: 131 lb (59.421 kg)    Physical Exam  Constitutional: He is oriented to person, place, and time. No distress.  Frail elderly male, nad  HENT:  Head: Normocephalic and atraumatic.  Mouth/Throat: Oropharynx is clear and moist. No oropharyngeal exudate.  Eyes: Conjunctivae and EOM are normal. Pupils are equal, round, and reactive to light.  Neck: Normal range of motion. Neck supple.  Cardiovascular: Normal rate, regular rhythm and normal heart sounds.   Pulmonary/Chest: Effort normal and breath sounds normal.  Abdominal: Soft. Bowel sounds are normal.  Musculoskeletal: He exhibits no edema or tenderness.  Neurological: He is alert and oriented to person, place, and time.  Skin: Skin is warm and dry. He is not diaphoretic.  Psychiatric: He has a normal mood and affect. Cognition and memory are impaired.    Labs reviewed: Basic Metabolic Panel:  Recent Labs  08/11/15 1411 08/14/15 1441 08/14/15 1450 08/15/15 1733  NA 134* 134* 135 132*  K 4.0 3.8 3.8 3.6  CL 96* 96* 93* 95*  CO2 30 30  --  31  GLUCOSE 92 95 91 113*  BUN 18 17 22* 17  CREATININE 1.15 1.22 1.10 1.17  CALCIUM 9.0 8.9  --  8.7*   Liver Function  Tests:  Recent Labs  08/11/15 1411 08/14/15 1441 08/15/15 1733  AST 20 30 27   ALT 14* 20 22  ALKPHOS 100 100 98  BILITOT 0.8 0.5 0.7  PROT 7.2 6.3* 6.2*  ALBUMIN 3.8 3.2* 3.0*   No results for input(s): LIPASE, AMYLASE in the last 8760 hours. No results for input(s): AMMONIA in the last 8760 hours. CBC:  Recent Labs  08/11/15 1411 08/14/15 1441 08/14/15 1450 08/15/15 1733  WBC 7.6 9.1  --  8.8  NEUTROABS 4.8 6.1  --  5.7  HGB 13.4 13.7 15.6 13.0  HCT 41.2 42.2 46.0 40.4  MCV 83.7 82.3  --  82.1  PLT 262 262  --  251   TSH: No results for input(s): TSH in the last 8760 hours. A1C: Lab Results  Component Value Date   HGBA1C 5.6 08/12/2015   Lipid Panel:  Recent Labs  08/12/15 0539  CHOL 201*  HDL 41  LDLCALC 140*  TRIG 98  CHOLHDL 4.9    Radiological Exams: Dg Chest 2 View  08/14/2015  CLINICAL DATA:  Cough and weakness Xfew days; hx HTN, ex-smoker EXAM: CHEST  2 VIEW COMPARISON:  08/11/2015 FINDINGS: Lateral view degraded, including secondary to arm position. Osteopenia. Patient rotated left on the frontal radiograph. Mild cardiomegaly with transverse aortic atherosclerosis. No pleural effusion or pneumothorax. Bibasilar scarring or atelectasis. No congestive failure. IMPRESSION: Cardiomegaly and aortic atherosclerosis, without acute disease. Electronically Signed   By: Abigail Miyamoto M.D.   On: 08/14/2015 15:01   Ct Head Wo Contrast  08/14/2015  CLINICAL DATA:  Gait disturbance ; altered mental status EXAM: CT HEAD WITHOUT CONTRAST TECHNIQUE: Contiguous axial images were obtained from the base of the skull through the vertex without intravenous contrast. COMPARISON:  August 11, 2015 head CT; December 14 26 brain MRI FINDINGS: Moderate diffuse atrophy is stable. There is no intracranial mass, hemorrhage, extra-axial fluid collection, or midline shift. There is small vessel disease in the centra semiovale bilaterally, stable. There is evidence of a prior focal  lacunar infarct in the mid right centrum semiovale, stable. There is also a prior lacunar type infarct in the posterior limb of the left internal capsule, stable. No new gray-white compartment lesions are evident. No acute infarct evident. Bony calvarium appears intact. Mastoid air cells are clear. No intraorbital lesions are identified. IMPRESSION: Stable diffuse atrophy. The configuration of the ventricles raises question of possible concomitant communicating hydrocephalus. There is supratentorial small vessel disease, stable. No acute infarct evident. No hemorrhage or mass effect. Electronically Signed   By: Lowella Grip III M.D.   On: 08/14/2015 15:29    Assessment/Plan 1. Transient cerebral ischemia, unspecified transient cerebral ischemia type Stable, Recent TIA. Continue aspirin 325 mg daily and zocor 20 mg daily.  2. Essential hypertension Blood pressure stable, conts on norvasc 10 mg daily   3. Gastroesophageal reflux disease, esophagitis presence not specified -stable, conts on protonix 40 mg daily  4. Dysphagia Followed by ST here, will get home health ST for eval and treatment, conts on pureed diet with nectar thick liquids.   5. Protein-calorie malnutrition ([place]) Cont on supplements, will need ongoing outpatient follow up   6. Bilateral conjunctivitis Stable, remains on tobramycin daily   pt is stable for discharge-will need PT/OT/ST/HHAid per home health. No DME needed. Rx written.  will need to follow up with PCP within 2 weeks.    Carlos American. Harle Battiest  [facility] [phone] 8 am - 5 pm) [phone] (after hours)

## 2015-09-09 ENCOUNTER — Telehealth: Payer: Self-pay | Admitting: Family Medicine

## 2015-09-09 ENCOUNTER — Ambulatory Visit: Payer: Medicare Other | Admitting: Family Medicine

## 2015-09-09 NOTE — Telephone Encounter (Signed)
Erin from [place] called - requesting in home PT  2x week for 6 weeks cb [phone]  Pt also requested order for medical social work to discuss [place]

## 2015-09-09 NOTE — Telephone Encounter (Signed)
Pt did not come in for their appt today for follow up. Please let me know if pt needs to be contacted immediately for follow up or no follow up needed. Best phone number to contact pt is [phone].

## 2015-09-10 NOTE — Telephone Encounter (Signed)
Left detailed message on voicemail.  

## 2015-09-10 NOTE — Telephone Encounter (Signed)
[name] completely forgot about the appointment yesterday.  Patient is doing okay but wife says it is extremely hard for her to get him out of the house.  Appointment rescheduled for 09/18/2015 and she says she will try to get some help lined up to get him here.

## 2015-09-10 NOTE — Telephone Encounter (Signed)
Please give the orders.  Thanks.  

## 2015-09-10 NOTE — Telephone Encounter (Signed)
Noted. Thanks.  Please make sure he doesn't get a no show fee.

## 2015-09-10 NOTE — Telephone Encounter (Signed)
Please check on patient.  Thanks.

## 2015-09-18 ENCOUNTER — Encounter: Payer: Self-pay | Admitting: Family Medicine

## 2015-09-18 ENCOUNTER — Telehealth: Payer: Self-pay | Admitting: Family Medicine

## 2015-09-18 ENCOUNTER — Ambulatory Visit (INDEPENDENT_AMBULATORY_CARE_PROVIDER_SITE_OTHER): Payer: Medicare Other | Admitting: Family Medicine

## 2015-09-18 VITALS — BP 162/56 | HR 48 | Temp 98.1°F | Wt 135.0 lb

## 2015-09-18 DIAGNOSIS — R531 Weakness: Secondary | ICD-10-CM

## 2015-09-18 DIAGNOSIS — R05 Cough: Secondary | ICD-10-CM | POA: Diagnosis not present

## 2015-09-18 DIAGNOSIS — R059 Cough, unspecified: Secondary | ICD-10-CM

## 2015-09-18 DIAGNOSIS — I10 Essential (primary) hypertension: Secondary | ICD-10-CM | POA: Diagnosis not present

## 2015-09-18 DIAGNOSIS — S72002S Fracture of unspecified part of neck of left femur, sequela: Secondary | ICD-10-CM | POA: Diagnosis not present

## 2015-09-18 NOTE — Telephone Encounter (Signed)
Please give the order.  Thanks.   

## 2015-09-18 NOTE — Assessment & Plan Note (Signed)
Continue prn hydrocodone.  No ADE on med.

## 2015-09-18 NOTE — Telephone Encounter (Signed)
Heidi from [place] called requesting  verbal order for  OT  2 times week for 6 weeks   cb number is [phone]

## 2015-09-18 NOTE — Assessment & Plan Note (Signed)
Continue aspiration precautions, d/w pt and wife.

## 2015-09-18 NOTE — Assessment & Plan Note (Signed)
I want him/his wife to verify his meds at home and then update me. I didn't change his meds at his point.  >25 minutes spent in face to face time with patient, >50% spent in counselling or coordination of care.

## 2015-09-18 NOTE — Telephone Encounter (Signed)
Left detailed message on voicemail of Catalina Lunger.

## 2015-09-18 NOTE — Telephone Encounter (Signed)
[name] called She needs approval for additional social work visit to help with pt and family to get additional services in home to help spouse care for pt One time next week and one time in month of feb

## 2015-09-18 NOTE — Patient Instructions (Addendum)
Check your meds at home and see if the list matches up.  Either way, let me know.   Let me know what your BP is running when PT comes out.  Keep working with PT for the left leg weakness and keep using your walker.   Please call the [facility] and see if they can provide some help.  Eat sitting up and don't lay down right after eating.   Take care.  Glad to see you.

## 2015-09-18 NOTE — Assessment & Plan Note (Signed)
Continue PT, walker use. [facility] in [facility].  This is reasonable.

## 2015-09-18 NOTE — Progress Notes (Signed)
Pre visit review using our clinic review tool, if applicable. No additional management support is needed unless otherwise documented below in the visit note.  Now back home from SNF.  Needing help with walking, showering, etc.  He is swallowing much better now.  Still has PT coming to the home.  Some cough.  No sputum.  No fevers. Pain is controlled, L leg pain after surgery.  Still with prn hydrocodone.  Using walker.  No falls recently.  Some SOB occ, not by patient report but wife has noted, he then reports mild episodes occ.  Has been using SABA qhs for cough, with some relief. No BLE edema.   Wife is still trying to care for him.  She is worried and having insomnia- d/w her about trying melatonin at night.   PMH and SH reviewed  ROS: See HPI, otherwise noncontributory.  Meds, vitals, and allergies reviewed.   GEN: nad, alert and oriented, using walker with dec stride length with L leg noted.  HEENT: mucous membranes moist NECK: supple w/o LA CV: rrr.  PULM: occ scant and faint rhonchi, o/w ctab, no inc wob ABD: soft, +bs EXT: no edema L hip flexors weaker than R, as expected.

## 2015-09-19 NOTE — Telephone Encounter (Signed)
Please send the order.  Thanks.  

## 2015-09-21 NOTE — Telephone Encounter (Signed)
Left detailed message on voicemail.  

## 2015-09-24 DIAGNOSIS — K222 Esophageal obstruction: Secondary | ICD-10-CM | POA: Diagnosis not present

## 2015-09-24 DIAGNOSIS — I10 Essential (primary) hypertension: Secondary | ICD-10-CM

## 2015-09-24 DIAGNOSIS — R1312 Dysphagia, oropharyngeal phase: Secondary | ICD-10-CM

## 2015-09-24 DIAGNOSIS — R262 Difficulty in walking, not elsewhere classified: Secondary | ICD-10-CM | POA: Diagnosis not present

## 2015-09-24 DIAGNOSIS — R531 Weakness: Secondary | ICD-10-CM

## 2015-09-24 DIAGNOSIS — Z8673 Personal history of transient ischemic attack (TIA), and cerebral infarction without residual deficits: Secondary | ICD-10-CM | POA: Diagnosis not present

## 2015-10-05 ENCOUNTER — Telehealth: Payer: Self-pay | Admitting: Family Medicine

## 2015-10-05 NOTE — Telephone Encounter (Signed)
[name] called to report patient had fall this weekend No injury no brusing,no tenderness,no soreness

## 2015-10-06 NOTE — Telephone Encounter (Signed)
Noted. Thanks.

## 2015-10-06 NOTE — Telephone Encounter (Signed)
Noted, if PT isn't still coming to the house then let me know so I can reorder it.  Thanks.

## 2015-10-06 NOTE — Telephone Encounter (Signed)
Wife says PT is still coming and working with the patient.

## 2015-10-09 ENCOUNTER — Telehealth: Payer: Self-pay

## 2015-10-09 NOTE — Telephone Encounter (Signed)
Please give the order.  Thanks.   

## 2015-10-09 NOTE — Telephone Encounter (Signed)
Spoke to [place] and provided verbal order

## 2015-10-09 NOTE — Telephone Encounter (Signed)
Ann PT with [facility] left v/m requesting verbal order continuation of home health PT 2 x a week for 4 more weeks for gait training,balance, fall prevention and home safety.

## 2015-10-15 ENCOUNTER — Other Ambulatory Visit: Payer: Self-pay | Admitting: *Deleted

## 2015-10-15 ENCOUNTER — Telehealth: Payer: Self-pay

## 2015-10-15 MED ORDER — AMLODIPINE BESYLATE 10 MG PO TABS: 10.0000 mg | ORAL_TABLET | Freq: Every day | ORAL | Status: DC

## 2015-10-15 MED ORDER — SIMVASTATIN 20 MG PO TABS: 20.0000 mg | ORAL_TABLET | Freq: Every day | ORAL | Status: AC

## 2015-10-15 MED ORDER — PANTOPRAZOLE SODIUM 40 MG PO TBEC: 40.0000 mg | DELAYED_RELEASE_TABLET | Freq: Every day | ORAL | Status: DC

## 2015-10-15 NOTE — Telephone Encounter (Signed)
V/M left requesting cb. Pt requesting refill on multiple refills that Dr Damita Dunnings has not prescribed before. pts brother in law (No DPR signed) said there are 7 or so meds that Dr Damita Dunnings needs to refill; at 09/18/15 [facility] pt was to ck meds at home and let Dr Damita Dunnings know if meds at home matched med list. pts brother in law said he will bring list of needed med to [facility] on 09/15/15.

## 2015-10-15 NOTE — Telephone Encounter (Signed)
Faxed refill request. This medication is not on the patient's current meds list nor do I see it on the History.  Please advise.

## 2015-10-16 NOTE — Telephone Encounter (Signed)
Neither patient nor wife remembers anything about this medication.

## 2015-10-16 NOTE — Telephone Encounter (Signed)
I denied it.  Thanks.   Let me know if [facility] can't help them with med organization.

## 2015-10-16 NOTE — Telephone Encounter (Signed)
I don't see in EMR either.  Please clarify with patient/wife.  Thanks.

## 2015-10-20 ENCOUNTER — Ambulatory Visit (INDEPENDENT_AMBULATORY_CARE_PROVIDER_SITE_OTHER): Payer: Medicare Other | Admitting: Family Medicine

## 2015-10-20 ENCOUNTER — Encounter: Payer: Self-pay | Admitting: Family Medicine

## 2015-10-20 VITALS — BP 130/58 | HR 66 | Temp 98.4°F | Wt 120.5 lb

## 2015-10-20 DIAGNOSIS — R399 Unspecified symptoms and signs involving the genitourinary system: Secondary | ICD-10-CM

## 2015-10-20 DIAGNOSIS — R634 Abnormal weight loss: Secondary | ICD-10-CM | POA: Diagnosis not present

## 2015-10-20 LAB — COMPREHENSIVE METABOLIC PANEL
ALT: 18 U/L (ref 0–53)
AST: 22 U/L (ref 0–37)
Albumin: 3.8 g/dL (ref 3.5–5.2)
Alkaline Phosphatase: 100 U/L (ref 39–117)
BUN: 17 mg/dL (ref 6–23)
CALCIUM: 9.4 mg/dL (ref 8.4–10.5)
CHLORIDE: 97 meq/L (ref 96–112)
CO2: 36 meq/L — AB (ref 19–32)
CREATININE: 1.03 mg/dL (ref 0.40–1.50)
GFR: 72.31 mL/min (ref >60.00)
Glucose, Bld: 85 mg/dL (ref 70–99)
Potassium: 3.6 mEq/L (ref 3.5–5.1)
Sodium: 137 mEq/L (ref 135–145)
Total Bilirubin: 0.4 mg/dL (ref 0.2–1.2)
Total Protein: 7.3 g/dL (ref 6.0–8.3)

## 2015-10-20 LAB — CBC WITH DIFFERENTIAL/PLATELET
BASOS PCT: 0.3 % (ref 0.0–3.0)
Basophils Absolute: 0 10*3/uL (ref 0.0–0.1)
EOS ABS: 0.3 10*3/uL (ref 0.0–0.7)
Eosinophils Relative: 2.6 % (ref 0.0–5.0)
HEMATOCRIT: 41.2 % (ref 39.0–52.0)
Hemoglobin: 13.6 g/dL (ref 13.0–17.0)
LYMPHS PCT: 25.6 % (ref 12.0–46.0)
Lymphs Abs: 2.6 10*3/uL (ref 0.7–4.0)
MCHC: 33 g/dL (ref 30.0–36.0)
MCV: 81.5 fl (ref 78.0–100.0)
Monocytes Absolute: 0.7 10*3/uL (ref 0.1–1.0)
Monocytes Relative: 7.1 % (ref 3.0–12.0)
NEUTROS ABS: 6.6 10*3/uL (ref 1.4–7.7)
Neutrophils Relative %: 64.4 % (ref 43.0–77.0)
PLATELETS: 326 10*3/uL (ref 150.0–400.0)
RBC: 5.06 Mil/uL (ref 4.22–5.81)
RDW: 16.4 % — AB (ref 11.5–15.5)
WBC: 10.2 10*3/uL (ref 4.0–10.5)

## 2015-10-20 LAB — TSH: TSH: 1.93 u[IU]/mL (ref 0.35–4.50)

## 2015-10-20 MED ORDER — TAMSULOSIN HCL 0.4 MG PO CAPS: 0.4000 mg | ORAL_CAPSULE | Freq: Every day | ORAL | Status: AC

## 2015-10-20 NOTE — Assessment & Plan Note (Signed)
He has been on vesicare prev, with prev URO notes from 2012 reviewed.  Likely with obstructive component at this point, would restart flomax and we'll proceed from there depending on his response.  Okay for outpatient f/u.  Caution re: lightheadedness.  >25 minutes spent in face to face time with patient, >50% spent in counselling or coordination of care.

## 2015-10-20 NOTE — Assessment & Plan Note (Signed)
He has declined; weight loss from unknown source.  Check basic labs today and will ask for RN eval at home.  He is a fall risk and needs to continue using walker.

## 2015-10-20 NOTE — Progress Notes (Signed)
Pre visit review using our clinic review tool, if applicable. No additional management support is needed unless otherwise documented below in the visit note.  Weight is down.  Still eating well per patient and wife's report.  He didn't know where the weight loss was coming from.  No clear source, no focal source.  Pain is controlled.  Not lightheaded on standing.  Still using walker.    Urinary sx. D/w pt.  Urinary frequency.  Slow stream.  Doesn't feel like he is emptying fully, worse in the last few weeks. Nocturia.  No burning with urination.  This has been going on for months or longer, worse recently.    D/w pt about getting home health out to his house.  This is reasonable.   Meds, vitals, and allergies reviewed.   ROS: See HPI.  Otherwise, noncontributory.  GEN: nad, alert and oriented, thinner than baseline.   HEENT: mucous membranes moist NECK: supple w/o LA, no tmg CV: rrr.   PULM: ctab, no inc wob ABD: soft, +bs EXT: no edema SKIN: no acute rash Slow to rise, using walker- he couldn't walk safely w/o walker.

## 2015-10-20 NOTE — Patient Instructions (Signed)
Go to the lab on the way out.  We'll contact you with your lab report. Add on flomax and see if that helps with urination.  It can make you lightheaded, so be careful standing.  Take care. Glad to see you.

## 2015-10-21 ENCOUNTER — Telehealth: Payer: Self-pay | Admitting: *Deleted

## 2015-10-21 NOTE — Telephone Encounter (Signed)
This is the second request on this.   To my knowledge, he has never been on it.  At the [place], I didn't think he was on it.   Please clarify with pharmacy.   Who was the originating MD?

## 2015-10-21 NOTE — Telephone Encounter (Signed)
Received faxed refill for Sertraline 25 mg, take one by mouth daily Medication is not on medication list Is it okay to refill? Last office visit 10/20/15

## 2015-10-22 NOTE — Telephone Encounter (Signed)
I would not refill, as patient hasn't been on it.  Thanks.

## 2015-10-22 NOTE — Telephone Encounter (Signed)
Spoke with pharmacist at [facility].  The original prescriber was Sherrie Mustache in the nursing home in January.  Pharmacist says he has had it filled a few times but patient and wife says they have no recollection of his taking it.  Please advise.

## 2015-10-26 ENCOUNTER — Ambulatory Visit: Payer: Medicare Other | Admitting: Neurology

## 2015-10-27 NOTE — Telephone Encounter (Signed)
See refill note 10/15/15 and pt has been seen since this note.

## 2015-10-28 ENCOUNTER — Telehealth: Payer: Self-pay

## 2015-10-28 MED ORDER — SOLIFENACIN SUCCINATE 5 MG PO TABS: 5.0000 mg | ORAL_TABLET | Freq: Every day | ORAL | Status: DC

## 2015-10-28 NOTE — Telephone Encounter (Signed)
Patient's wife notified as instructed by telephone and verbalized understanding. 

## 2015-10-28 NOTE — Telephone Encounter (Signed)
Mrs Deren said pt is urinating 4-5 times at night; wearing depends but cannot keep pt dry. Pt taking flomax as instructed. No pain. Mrs. Corsi wants to know Dr Carole Civil opinion of what should do or try next? Mrs Engquist request cb.[facility] pt last seen 10/20/15.

## 2015-10-28 NOTE — Telephone Encounter (Signed)
We can add back vesicare.  Had been on prev per uro.  If not better after that, will need f/u with uro.  Thanks.  rx sent.

## 2015-11-10 ENCOUNTER — Other Ambulatory Visit: Payer: Self-pay

## 2015-11-10 NOTE — Telephone Encounter (Signed)
Advised pt that Dr Damita Dunnings said pt needs to be evaluated; if falling and problems with urination needs to go to ED; pt said he knows nothing is broken and refuses to go to ED wants Dr Damita Dunnings to give him something for pain; [facility] appt schedule does not have any available appts on 11/11/15. Please advise.

## 2015-11-10 NOTE — Telephone Encounter (Signed)
[name] pts wife said pt fell this morning when he first got up; [name] does not know how pt fell, she was not in room with pt; frances found pt in floor of bedroom. Pt having lower back pain; pt told wife after fall he hurt all over. Pain level now is bad per [name]; last few weeks pt is wetting the bed and urinating more frequently. [name] could not give me how often pt is urinating. [name] said Hydrocodone usually helps pts pain. [facility]. Pt does not want to go to hospital because he is afraid he will get admitted. [name] request cb. Pt is out of hydrocodone.

## 2015-11-11 MED ORDER — TRAMADOL HCL 50 MG PO TABS: 50.0000 mg | ORAL_TABLET | Freq: Three times a day (TID) | ORAL | Status: DC | PRN

## 2015-11-11 MED ORDER — SOLIFENACIN SUCCINATE 5 MG PO TABS: 5.0000 mg | ORAL_TABLET | Freq: Every day | ORAL | Status: DC

## 2015-11-11 NOTE — Telephone Encounter (Signed)
Amy from [facility] left v/m requesting status of pain med rx.

## 2015-11-11 NOTE — Telephone Encounter (Signed)
Patient's wife notified as instructed by telephone and verbalized understanding. Mrs. Meroney stated that she will wait to hear from [facility].

## 2015-11-11 NOTE — Telephone Encounter (Signed)
Notes reviewed.  If his urinary sx are worse, then we'll need to set him back up with urology.  Is he still on flomax along with the vesicare?  Did it make any difference?  We can't call in hydrocodone.   If hurting bad enough to need hydrocodone, then needs eval.   Okay to call in tramadol in the meantime.  I would rec that he get a home fall alert button in the meantime.  If they need contact information for an agency, then let me know.

## 2015-11-11 NOTE — Telephone Encounter (Signed)
Rx called to pharmacy as instructed.  Patient's wife notified as instructed by telephone and verbalized understanding. Patient's wife stated that patient is taking Flomax, but is not taking Vesicare and she does not know how he did taking them together. Patient's wife stated that they can not afford to see a urologist. Mrs. Maurer stated that they have seen so many different doctors in the past month and bills are rolling in and they are on a fixed income. Patient's wife stated that they can not afford another bill and therefore not interested in a home fall alert system. Mrs. Dunivant stated that she is having a bad day and does not know how to fix things.  Mrs. Chermak stated that she would like to bring the patient in to see Dr. Damita Dunnings today, but has been told that he does not have any openings today and tomorrow. Please advise.

## 2015-11-11 NOTE — Telephone Encounter (Signed)
vesicare sent.   Thanks to all involved.

## 2015-11-11 NOTE — Telephone Encounter (Signed)
We can open a spot tomorrow.  If they still have the vesicare rx, then okay to add on.   I'm currently not in clinic now to add on a slot.   In the meantime, the issue remains about his long term care needs at home.  They both need support.  Are they willing to have extra help in the home?  And if so please notify SW for resources.

## 2015-11-11 NOTE — Telephone Encounter (Signed)
Patient's wife notified as instructed by telephone and verbalized understanding. Mrs. Pett stated that she will hold off on an appointment tomorrow, but will need a script for Vesicare sent to [facility] because they do not have any at the house.  Patient's wife stated that she would be willing to get extra help in the home, but can not afford services. Mrs. Vidrio stated that she would like a call from [facility] to see what resources may be available for them. Message routed to Dr. Damita Dunnings and Jinny Blossom.

## 2015-11-12 ENCOUNTER — Encounter (HOSPITAL_COMMUNITY): Payer: Self-pay | Admitting: Emergency Medicine

## 2015-11-12 ENCOUNTER — Emergency Department (HOSPITAL_COMMUNITY): Payer: Medicare Other

## 2015-11-12 ENCOUNTER — Observation Stay (HOSPITAL_COMMUNITY)
Admission: EM | Admit: 2015-11-12 | Discharge: 2015-11-16 | Disposition: A | Discharge status: Skilled Nursing Facility | Payer: Medicare Other | Attending: Internal Medicine | Admitting: Internal Medicine

## 2015-11-12 DIAGNOSIS — H3530 Unspecified macular degeneration: Secondary | ICD-10-CM | POA: Insufficient documentation

## 2015-11-12 DIAGNOSIS — E8770 Fluid overload, unspecified: Secondary | ICD-10-CM | POA: Diagnosis not present

## 2015-11-12 DIAGNOSIS — Z79899 Other long term (current) drug therapy: Secondary | ICD-10-CM | POA: Insufficient documentation

## 2015-11-12 DIAGNOSIS — N179 Acute kidney failure, unspecified: Secondary | ICD-10-CM | POA: Insufficient documentation

## 2015-11-12 DIAGNOSIS — J9601 Acute respiratory failure with hypoxia: Secondary | ICD-10-CM | POA: Diagnosis present

## 2015-11-12 DIAGNOSIS — Z87891 Personal history of nicotine dependence: Secondary | ICD-10-CM | POA: Diagnosis not present

## 2015-11-12 DIAGNOSIS — I471 Supraventricular tachycardia: Secondary | ICD-10-CM | POA: Insufficient documentation

## 2015-11-12 DIAGNOSIS — I110 Hypertensive heart disease with heart failure: Principal | ICD-10-CM | POA: Insufficient documentation

## 2015-11-12 DIAGNOSIS — Z8673 Personal history of transient ischemic attack (TIA), and cerebral infarction without residual deficits: Secondary | ICD-10-CM | POA: Diagnosis not present

## 2015-11-12 DIAGNOSIS — Z7982 Long term (current) use of aspirin: Secondary | ICD-10-CM | POA: Diagnosis not present

## 2015-11-12 DIAGNOSIS — W1830XA Fall on same level, unspecified, initial encounter: Secondary | ICD-10-CM | POA: Diagnosis not present

## 2015-11-12 DIAGNOSIS — M79606 Pain in leg, unspecified: Secondary | ICD-10-CM

## 2015-11-12 DIAGNOSIS — IMO0001 Reserved for inherently not codable concepts without codable children: Secondary | ICD-10-CM

## 2015-11-12 DIAGNOSIS — K5900 Constipation, unspecified: Secondary | ICD-10-CM | POA: Diagnosis not present

## 2015-11-12 DIAGNOSIS — R0602 Shortness of breath: Secondary | ICD-10-CM | POA: Diagnosis present

## 2015-11-12 DIAGNOSIS — S22080A Wedge compression fracture of T11-T12 vertebra, initial encounter for closed fracture: Secondary | ICD-10-CM | POA: Diagnosis present

## 2015-11-12 DIAGNOSIS — E785 Hyperlipidemia, unspecified: Secondary | ICD-10-CM | POA: Diagnosis not present

## 2015-11-12 DIAGNOSIS — S22089A Unspecified fracture of T11-T12 vertebra, initial encounter for closed fracture: Secondary | ICD-10-CM | POA: Insufficient documentation

## 2015-11-12 DIAGNOSIS — R140 Abdominal distension (gaseous): Secondary | ICD-10-CM

## 2015-11-12 DIAGNOSIS — M4850XA Collapsed vertebra, not elsewhere classified, site unspecified, initial encounter for fracture: Secondary | ICD-10-CM | POA: Insufficient documentation

## 2015-11-12 DIAGNOSIS — I5033 Acute on chronic diastolic (congestive) heart failure: Secondary | ICD-10-CM | POA: Diagnosis present

## 2015-11-12 DIAGNOSIS — D72829 Elevated white blood cell count, unspecified: Secondary | ICD-10-CM | POA: Diagnosis not present

## 2015-11-12 LAB — BASIC METABOLIC PANEL
Anion gap: 10 (ref 5–15)
BUN: 32 mg/dL — AB (ref 6–20)
CO2: 27 mmol/L (ref 22–32)
Calcium: 9.2 mg/dL (ref 8.9–10.3)
Chloride: 99 mmol/L — ABNORMAL LOW (ref 101–111)
Creatinine, Ser: 1.12 mg/dL (ref 0.61–1.24)
GFR calc Af Amer: >60 mL/min (ref >60)
GFR, EST NON AFRICAN AMERICAN: 57 mL/min — AB (ref >60)
GLUCOSE: 123 mg/dL — AB (ref 65–99)
POTASSIUM: 4.2 mmol/L (ref 3.5–5.1)
Sodium: 136 mmol/L (ref 135–145)

## 2015-11-12 LAB — HEPATIC FUNCTION PANEL
ALK PHOS: 107 U/L (ref 38–126)
ALT: 39 U/L (ref 17–63)
AST: 38 U/L (ref 15–41)
Albumin: 4.1 g/dL (ref 3.5–5.0)
BILIRUBIN DIRECT: 0.1 mg/dL (ref 0.1–0.5)
BILIRUBIN INDIRECT: 0.8 mg/dL (ref 0.3–0.9)
BILIRUBIN TOTAL: 0.9 mg/dL (ref 0.3–1.2)
Total Protein: 8 g/dL (ref 6.5–8.1)

## 2015-11-12 LAB — BRAIN NATRIURETIC PEPTIDE: B Natriuretic Peptide: 161.2 pg/mL — ABNORMAL HIGH (ref 0.0–100.0)

## 2015-11-12 LAB — CBC
HEMATOCRIT: 39.1 % (ref 39.0–52.0)
Hemoglobin: 13.1 g/dL (ref 13.0–17.0)
MCH: 27.1 pg (ref 26.0–34.0)
MCHC: 33.5 g/dL (ref 30.0–36.0)
MCV: 80.8 fL (ref 78.0–100.0)
PLATELETS: 280 10*3/uL (ref 150–400)
RBC: 4.84 MIL/uL (ref 4.22–5.81)
RDW: 15.5 % (ref 11.5–15.5)
WBC: 11.9 10*3/uL — ABNORMAL HIGH (ref 4.0–10.5)

## 2015-11-12 LAB — I-STAT TROPONIN, ED: TROPONIN I, POC: 0.01 ng/mL (ref 0.00–0.08)

## 2015-11-12 MED ORDER — IOHEXOL 300 MG/ML  SOLN
100.0000 mL | Freq: Once | INTRAMUSCULAR | Status: AC | PRN
  Administered 2015-11-12: 100 mL via INTRAVENOUS

## 2015-11-12 MED ORDER — FUROSEMIDE 10 MG/ML IJ SOLN
40.0000 mg | Freq: Once | INTRAMUSCULAR | Status: AC
  Administered 2015-11-12: 40 mg via INTRAVENOUS
  Filled 2015-11-12: qty 4

## 2015-11-12 MED ORDER — SIMETHICONE 40 MG/0.6ML PO SUSP (UNIT DOSE)
40.0000 mg | Freq: Once | ORAL | Status: AC
  Administered 2015-11-12: 40 mg via ORAL
  Filled 2015-11-12: qty 0.6

## 2015-11-12 MED ORDER — IBUPROFEN 200 MG PO TABS
400.0000 mg | ORAL_TABLET | Freq: Once | ORAL | Status: AC
  Administered 2015-11-12: 400 mg via ORAL
  Filled 2015-11-12: qty 2

## 2015-11-12 MED ORDER — IOHEXOL 300 MG/ML  SOLN: 25.0000 mL | Freq: Once | INTRAMUSCULAR | Status: DC | PRN

## 2015-11-12 NOTE — ED Notes (Signed)
Pt O2 sat dropped to 89 while walking

## 2015-11-12 NOTE — ED Notes (Signed)
Hospitalist at the bedside 

## 2015-11-12 NOTE — ED Notes (Signed)
Pt tried for UA and was unsuccessful

## 2015-11-12 NOTE — ED Notes (Signed)
Patient reports fall yesterday, denies LOC, no anticoagulants. C/o abdominal swelling, SOB, back pain, bilateral ankle swelling.

## 2015-11-12 NOTE — ED Notes (Addendum)
Patient reports he fell yesterday onto his back. Patient states he is unsure if he became dizzy and fell or if he slipped and fell.  Patient denies LOC.  Reports pain to sacrum.  Patient also states he has had stomach distention that began after fall yesterday.  Patients family members report frequency in falls over the past 2 months while using walker.  Patient states he has also been short of breath and does not wear oxygen at home.  Patient noted to become short of breath on minimal exertion during assessment.

## 2015-11-12 NOTE — ED Provider Notes (Signed)
CSN: [id number]     Arrival date & time 11/12/15  1844 History   First MD Initiated Contact with Patient 11/12/15 2133     Chief Complaint  Patient presents with  . Shortness of Breath  . Fall  . Fatigue     (Consider location/radiation/quality/duration/timing/severity/associated sxs/prior Treatment) HPI Patient presents with a fall yesterday onto buttocks. Complaining of low back pain since the fall. He also has had abdominal distention and decreased appetite. No nausea or vomiting. Had a small bowel movement this morning. Patient is also had bilateral lower extremity swelling and increased dyspnea on exertion. No cough present. Denies any chest pain. Past Medical History  Diagnosis Date  . Hypertension   . Hyperlipidemia   . Tobacco abuse   . Lung nodule     Left lung, seen 2012, no change in 2012, no follow up needed   . PNA (pneumonia) 2010    Bilateral Pneumonia, COPD 45mm LLL Nodule   . History of ETT 09/1991     POS   . History of MRI 04-22-06    L/S- right hnp  l5/si   . B12 deficiency   . TIA (transient ischemic attack)   . GERD (gastroesophageal reflux disease)   . Esophageal stricture   . Hemorrhoids   . Colon polyps     hyperplastic  . Pneumonia   . Macular degeneration   . Stroke [facility])    Past Surgical History  Procedure Laterality Date  . Cystectomy  1995    Lumbar area   . Hip pinning,cannulated Left 03/28/2015    Procedure: CANNULATED HIP PINNING;  Surgeon: [name], MD;  Location: [facility] ORS;  Service: Orthopedics;  Laterality: Left;   Family History  Problem Relation Age of Onset  . Hypertension Mother   . Heart attack Father   . Colon cancer Neg Hx   . Esophageal cancer Neg Hx    Social History  Substance Use Topics  . Smoking status: Former Smoker    Types: Cigarettes    Quit date: 08/29/1985  . Smokeless tobacco: Former Systems developer    Types: Chew  . Alcohol Use: No    Review of Systems  Constitutional: Positive for fatigue. Negative for fever  and chills.  Respiratory: Positive for shortness of breath. Negative for cough.   Cardiovascular: Positive for leg swelling. Negative for chest pain.  Gastrointestinal: Positive for abdominal distention. Negative for nausea, vomiting, abdominal pain, diarrhea and constipation.  Musculoskeletal: Positive for back pain. Negative for neck pain and neck stiffness.  Skin: Negative for rash and wound.  Neurological: Positive for weakness (generalized). Negative for dizziness, numbness and headaches.  All other systems reviewed and are negative.     Allergies  Review of patient's allergies indicates no known allergies.  Home Medications   Prior to Admission medications   Medication Sig Start Date End Date Taking? Authorizing Provider  albuterol (PROVENTIL HFA;VENTOLIN HFA) 108 (90 BASE) MCG/ACT inhaler Inhale 2 puffs into the lungs every 6 (six) hours as needed for wheezing or shortness of breath. 07/31/15  Yes Tonia Ghent, MD  amLODipine (NORVASC) 10 MG tablet Take 1 tablet (10 mg total) by mouth daily. 10/15/15  Yes Tonia Ghent, MD  neomycin-polymyxin b-dexamethasone (MAXITROL) 3.5-10000-0.1 OINT Place 1 application into both eyes 2 (two) times daily. 10/22/15  Yes Historical Provider, MD  simvastatin (ZOCOR) 20 MG tablet Take 1 tablet (20 mg total) by mouth daily. 10/15/15  Yes Tonia Ghent, MD  tamsulosin (FLOMAX) 0.4 MG  CAPS capsule Take 1 capsule (0.4 mg total) by mouth daily. 10/20/15  Yes Tonia Ghent, MD  tobramycin (TOBREX) 0.3 % ophthalmic solution Place 1 drop into both eyes 2 (two) times daily.  02/27/15  Yes Historical Provider, MD  traMADol (ULTRAM) 50 MG tablet Take 1 tablet (50 mg total) by mouth every 8 (eight) hours as needed. 11/11/15  Yes Tonia Ghent, MD  aspirin EC 325 MG tablet Take 1 tablet (325 mg total) by mouth daily. Patient not taking: Reported on 11/12/2015 08/13/15   Modena Jansky, MD  docusate sodium (COLACE) 100 MG capsule Take 1 capsule (100 mg  total) by mouth 2 (two) times daily. Patient not taking: Reported on 11/12/2015 03/30/15   Modena Jansky, MD  Multiple Vitamins-Minerals (PRESERVISION AREDS 2) CAPS Take 1 tablet by mouth 2 (two) times daily. Take as directed. Patient not taking: Reported on 11/12/2015 03/30/15   Modena Jansky, MD  pantoprazole (PROTONIX) 40 MG tablet Take 1 tablet (40 mg total) by mouth at bedtime. Patient not taking: Reported on 11/12/2015 10/15/15   Tonia Ghent, MD  solifenacin (VESICARE) 5 MG tablet Take 1 tablet (5 mg total) by mouth daily. 11/11/15   Tonia Ghent, MD   BP 157/80 mmHg  Pulse 79  Temp(Src) 98.1 F (36.7 C) (Oral)  Resp 18  SpO2 96% Physical Exam  Constitutional: He is oriented to person, place, and time. He appears well-developed and well-nourished. No distress.  HENT:  Head: Normocephalic and atraumatic.  Mouth/Throat: Oropharynx is clear and moist. No oropharyngeal exudate.  Eyes: EOM are normal. Pupils are equal, round, and reactive to light.  Neck: Normal range of motion. Neck supple.  No posterior midline cervical tenderness to palpation.  Cardiovascular: Normal rate and regular rhythm.  Exam reveals no gallop and no friction rub.   No murmur heard. Pulmonary/Chest: Effort normal. No respiratory distress. He has no wheezes. He has no rales. He exhibits no tenderness.  Few crackles in bilateral bases  Abdominal: Soft. Bowel sounds are normal. He exhibits distension. He exhibits no mass. There is no tenderness. There is no rebound and no guarding.  Firmly distended abdomen. Nontender.  Musculoskeletal: Normal range of motion. He exhibits edema. He exhibits no tenderness.  2+ bl lower ext pitting edema. No asymmetry or calf tenderness.   Neurological: He is alert and oriented to person, place, and time.  Mild confusion. 5/5 motor in all extremities. Sensation is intact.  Skin: Skin is warm and dry. No rash noted. No erythema.  Psychiatric: He has a normal mood and affect.  His behavior is normal.  Nursing note and vitals reviewed.   ED Course  Procedures (including critical care time) Labs Review Labs Reviewed  BASIC METABOLIC PANEL - Abnormal; Notable for the following:    Chloride 99 (*)    Glucose, Bld 123 (*)    BUN 32 (*)    GFR calc non Af Amer 57 (*)    All other components within normal limits  CBC - Abnormal; Notable for the following:    WBC 11.9 (*)    All other components within normal limits  BRAIN NATRIURETIC PEPTIDE - Abnormal; Notable for the following:    B Natriuretic Peptide 161.2 (*)    All other components within normal limits  HEPATIC FUNCTION PANEL  URINALYSIS, ROUTINE W REFLEX MICROSCOPIC (NOT AT [facility])  CBG MONITORING, ED  Randolm Idol, ED    Imaging Review Dg Chest 2 View  11/12/2015  CLINICAL DATA:  Shortness of breath, productive cough. EXAM: CHEST  2 VIEW COMPARISON:  August 14, 2015 FINDINGS: The heart size and mediastinal contours are stable. There is no focal infiltrate, pulmonary edema, or pleural effusion. With minimal atelectasis of both lung bases are noted. The visualized skeletal structures are unremarkable. IMPRESSION: No active cardiopulmonary disease. Electronically Signed   By: Abelardo Diesel M.D.   On: 11/12/2015 20:27   Ct Abdomen Pelvis W Contrast  11/12/2015  CLINICAL DATA:  Abdominal distention. EXAM: CT ABDOMEN AND PELVIS WITH CONTRAST TECHNIQUE: Multidetector CT imaging of the abdomen and pelvis was performed using the standard protocol following bolus administration of intravenous contrast. CONTRAST:  112mL OMNIPAQUE IOHEXOL 300 MG/ML  SOLN COMPARISON:  None. FINDINGS: Lower chest and abdominal wall: Bibasilar emphysema. Extensive atherosclerotic calcification. Fatty left inguinal hernia. Hepatobiliary: Few and small incidental cysts in the liver.Cholelithiasis without signs of obstruction or inflammation. Pancreas: Unremarkable. Spleen: Unremarkable. Adrenals/Urinary Tract: Negative adrenals. Renal  atrophy. Bilateral renal cysts; the largest is exophytic from the right lower pole measuring 53 mm and with benign peripheral calcification. No hydronephrosis or ureteral calculus. Unremarkable bladder. Reproductive:Symmetric prostate enlargement. Stomach/Bowel: Large volume of gas distending the entire colon. Stool retention is mild. No volvulus or impaction. No definitive over distension as usually seen with colonic ileus. No appendicitis. Vascular/Lymphatic: Advanced and diffuse atherosclerotic disease with 29 mm aortic terminus fusiform aneurysm. No acute vascular finding. No mass or adenopathy. Peritoneal: No ascites or pneumoperitoneum. Musculoskeletal: Remote left femoral neck fracture status post ORIF. A portion of the fracture is still visible but there is bridging bone. T12 compression fracture with acute appearance. Height loss is approximately 50%. Mild posterior retropulsion without canal stenosis. No subluxation or posterior element fracturing. Lumbar facet arthropathy with L4-5 and L5-S1 anterolisthesis. IMPRESSION: 1. Acute T12 compression fracture with moderate height loss and mild retropulsion. 2. Abdominal distention correlates with diffuse gaseous distention of colon. No mechanical obstruction. 3. Chronic findings are described above. Electronically Signed   By: Monte Fantasia M.D.   On: 11/12/2015 22:28   I have personally reviewed and evaluated these images and lab results as part of my medical decision-making.   EKG Interpretation   Date/Time:  Thursday November 12 2015 19:46:16 EDT Ventricular Rate:  95 PR Interval:  143 QRS Duration: 80 QT Interval:  337 QTC Calculation: 424 R Axis:   -2 Text Interpretation:  Sinus tachycardia Atrial premature complex Baseline  wander in lead(s) II III aVF Confirmed by Yazlin Ekblad  MD, Tiana Sivertson ([id number]) on  11/12/2015 11:05:34 PM      MDM   Final diagnoses:  Hypervolemia, unspecified hypervolemia type  Abdominal distension (gaseous)   Vertebral compression fracture, initial encounter [facility])   Patient with dyspnea with mild exertion. Oxygen saturations in the low 90s on room air. Evidence of fluid overload clinically. Mild elevation in BNP. Patient also has a compression fracture of T12. Gaseous distention without evidence of obstruction. Given dose of Lasix emergency department and discuss with [facility]. Will admit to MedSurg bed.     Julianne Rice, MD 11/12/15 [phone]

## 2015-11-13 ENCOUNTER — Other Ambulatory Visit (HOSPITAL_COMMUNITY): Payer: Medicare Other

## 2015-11-13 ENCOUNTER — Encounter (HOSPITAL_COMMUNITY): Payer: Self-pay | Admitting: Family Medicine

## 2015-11-13 DIAGNOSIS — E8779 Other fluid overload: Secondary | ICD-10-CM

## 2015-11-13 DIAGNOSIS — M4854XA Collapsed vertebra, not elsewhere classified, thoracic region, initial encounter for fracture: Secondary | ICD-10-CM | POA: Diagnosis not present

## 2015-11-13 DIAGNOSIS — M4850XA Collapsed vertebra, not elsewhere classified, site unspecified, initial encounter for fracture: Secondary | ICD-10-CM | POA: Diagnosis not present

## 2015-11-13 DIAGNOSIS — I5033 Acute on chronic diastolic (congestive) heart failure: Secondary | ICD-10-CM | POA: Diagnosis present

## 2015-11-13 DIAGNOSIS — M4854XS Collapsed vertebra, not elsewhere classified, thoracic region, sequela of fracture: Secondary | ICD-10-CM | POA: Diagnosis not present

## 2015-11-13 DIAGNOSIS — E8770 Fluid overload, unspecified: Secondary | ICD-10-CM | POA: Diagnosis not present

## 2015-11-13 DIAGNOSIS — S22080A Wedge compression fracture of T11-T12 vertebra, initial encounter for closed fracture: Secondary | ICD-10-CM | POA: Diagnosis present

## 2015-11-13 DIAGNOSIS — J9601 Acute respiratory failure with hypoxia: Secondary | ICD-10-CM

## 2015-11-13 DIAGNOSIS — D72829 Elevated white blood cell count, unspecified: Secondary | ICD-10-CM

## 2015-11-13 DIAGNOSIS — I110 Hypertensive heart disease with heart failure: Secondary | ICD-10-CM | POA: Diagnosis not present

## 2015-11-13 DIAGNOSIS — M4854XD Collapsed vertebra, not elsewhere classified, thoracic region, subsequent encounter for fracture with routine healing: Secondary | ICD-10-CM | POA: Diagnosis not present

## 2015-11-13 DIAGNOSIS — M4850XG Collapsed vertebra, not elsewhere classified, site unspecified, subsequent encounter for fracture with delayed healing: Secondary | ICD-10-CM | POA: Diagnosis not present

## 2015-11-13 DIAGNOSIS — N179 Acute kidney failure, unspecified: Secondary | ICD-10-CM | POA: Diagnosis not present

## 2015-11-13 LAB — URINALYSIS W MICROSCOPIC (NOT AT ARMC)
BACTERIA UA: NONE SEEN
BILIRUBIN URINE: NEGATIVE
Glucose, UA: NEGATIVE mg/dL
KETONES UR: NEGATIVE mg/dL
Leukocytes, UA: NEGATIVE
Nitrite: NEGATIVE
PH: 6 (ref 5.0–8.0)
Protein, ur: NEGATIVE mg/dL
SQUAMOUS EPITHELIAL / LPF: NONE SEEN
Specific Gravity, Urine: 1.01 (ref 1.005–1.030)
WBC UA: NONE SEEN WBC/hpf (ref 0–5)

## 2015-11-13 LAB — URINE MICROSCOPIC-ADD ON
Bacteria, UA: NONE SEEN
RBC / HPF: NONE SEEN RBC/hpf (ref 0–5)
SQUAMOUS EPITHELIAL / LPF: NONE SEEN

## 2015-11-13 LAB — URINALYSIS, ROUTINE W REFLEX MICROSCOPIC
Bilirubin Urine: NEGATIVE
Glucose, UA: NEGATIVE mg/dL
KETONES UR: NEGATIVE mg/dL
LEUKOCYTES UA: NEGATIVE
NITRITE: NEGATIVE
PH: 5.5 (ref 5.0–8.0)
PROTEIN: 100 mg/dL — AB
Specific Gravity, Urine: 1.018 (ref 1.005–1.030)

## 2015-11-13 MED ORDER — ONDANSETRON HCL 4 MG/2ML IJ SOLN: 4.0000 mg | Freq: Four times a day (QID) | INTRAMUSCULAR | Status: DC | PRN

## 2015-11-13 MED ORDER — ENOXAPARIN SODIUM 40 MG/0.4ML ~~LOC~~ SOLN
40.0000 mg | SUBCUTANEOUS | Status: DC
  Administered 2015-11-13 – 2015-11-14 (×2): 40 mg via SUBCUTANEOUS
  Filled 2015-11-13 (×4): qty 0.4

## 2015-11-13 MED ORDER — AMLODIPINE BESYLATE 5 MG PO TABS
5.0000 mg | ORAL_TABLET | Freq: Every day | ORAL | Status: DC
  Administered 2015-11-14: 5 mg via ORAL
  Filled 2015-11-13: qty 1

## 2015-11-13 MED ORDER — FUROSEMIDE 10 MG/ML IJ SOLN
40.0000 mg | Freq: Two times a day (BID) | INTRAMUSCULAR | Status: DC
  Filled 2015-11-13 (×3): qty 4

## 2015-11-13 MED ORDER — SIMETHICONE 40 MG/0.6ML PO SUSP
40.0000 mg | Freq: Four times a day (QID) | ORAL | Status: DC
  Administered 2015-11-13 – 2015-11-16 (×13): 40 mg via ORAL
  Filled 2015-11-13 (×17): qty 0.6

## 2015-11-13 MED ORDER — TOBRAMYCIN 0.3 % OP SOLN
1.0000 [drp] | Freq: Two times a day (BID) | OPHTHALMIC | Status: DC
Start: 2015-11-13 — End: 2015-11-16
  Administered 2015-11-13 – 2015-11-16 (×8): 1 [drp] via OPHTHALMIC
  Filled 2015-11-13 (×2): qty 5

## 2015-11-13 MED ORDER — METOPROLOL TARTRATE 25 MG PO TABS
12.5000 mg | ORAL_TABLET | Freq: Two times a day (BID) | ORAL | Status: DC
  Administered 2015-11-13 – 2015-11-16 (×5): 12.5 mg via ORAL
  Filled 2015-11-13 (×7): qty 1

## 2015-11-13 MED ORDER — FUROSEMIDE 10 MG/ML IJ SOLN
80.0000 mg | Freq: Once | INTRAMUSCULAR | Status: AC
  Administered 2015-11-13: 80 mg via INTRAVENOUS
  Filled 2015-11-13: qty 8

## 2015-11-13 MED ORDER — SODIUM CHLORIDE 0.9% FLUSH: 3.0000 mL | INTRAVENOUS | Status: DC | PRN

## 2015-11-13 MED ORDER — TRAMADOL HCL 50 MG PO TABS
50.0000 mg | ORAL_TABLET | Freq: Four times a day (QID) | ORAL | Status: DC | PRN
Start: 2015-11-13 — End: 2015-11-16
  Administered 2015-11-13 – 2015-11-16 (×3): 50 mg via ORAL
  Filled 2015-11-13 (×3): qty 1

## 2015-11-13 MED ORDER — ACETAMINOPHEN 325 MG PO TABS
650.0000 mg | ORAL_TABLET | ORAL | Status: DC | PRN
  Administered 2015-11-13: 650 mg via ORAL
  Filled 2015-11-13: qty 2

## 2015-11-13 MED ORDER — LISINOPRIL 2.5 MG PO TABS
2.5000 mg | ORAL_TABLET | Freq: Every day | ORAL | Status: DC
  Administered 2015-11-13 – 2015-11-14 (×2): 2.5 mg via ORAL
  Filled 2015-11-13 (×3): qty 1

## 2015-11-13 MED ORDER — MORPHINE SULFATE (PF) 2 MG/ML IV SOLN: 1.0000 mg | INTRAVENOUS | Status: DC | PRN

## 2015-11-13 MED ORDER — DARIFENACIN HYDROBROMIDE ER 7.5 MG PO TB24
7.5000 mg | ORAL_TABLET | Freq: Every day | ORAL | Status: DC
Start: 2015-11-13 — End: 2015-11-16
  Administered 2015-11-13 – 2015-11-16 (×4): 7.5 mg via ORAL
  Filled 2015-11-13 (×5): qty 1

## 2015-11-13 MED ORDER — SODIUM CHLORIDE 0.9 % IV SOLN: 250.0000 mL | INTRAVENOUS | Status: DC | PRN

## 2015-11-13 MED ORDER — PANTOPRAZOLE SODIUM 40 MG PO TBEC
40.0000 mg | DELAYED_RELEASE_TABLET | Freq: Every day | ORAL | Status: DC
  Administered 2015-11-13 – 2015-11-15 (×3): 40 mg via ORAL
  Filled 2015-11-13 (×3): qty 1

## 2015-11-13 MED ORDER — CETYLPYRIDINIUM CHLORIDE 0.05 % MT LIQD
7.0000 mL | Freq: Two times a day (BID) | OROMUCOSAL | Status: DC
  Administered 2015-11-13 – 2015-11-15 (×5): 7 mL via OROMUCOSAL

## 2015-11-13 MED ORDER — FUROSEMIDE 10 MG/ML IJ SOLN
40.0000 mg | Freq: Every day | INTRAMUSCULAR | Status: DC
  Administered 2015-11-13 – 2015-11-14 (×2): 40 mg via INTRAVENOUS
  Filled 2015-11-13 (×2): qty 4

## 2015-11-13 MED ORDER — SODIUM CHLORIDE 0.9% FLUSH
3.0000 mL | Freq: Two times a day (BID) | INTRAVENOUS | Status: DC
  Administered 2015-11-13 – 2015-11-15 (×6): 3 mL via INTRAVENOUS

## 2015-11-13 MED ORDER — AMLODIPINE BESYLATE 10 MG PO TABS
10.0000 mg | ORAL_TABLET | Freq: Every day | ORAL | Status: DC
  Administered 2015-11-13: 10 mg via ORAL
  Filled 2015-11-13 (×2): qty 1

## 2015-11-13 MED ORDER — TAMSULOSIN HCL 0.4 MG PO CAPS
0.4000 mg | ORAL_CAPSULE | Freq: Every day | ORAL | Status: DC
  Administered 2015-11-13 – 2015-11-16 (×4): 0.4 mg via ORAL
  Filled 2015-11-13 (×5): qty 1

## 2015-11-13 MED ORDER — SIMVASTATIN 20 MG PO TABS
20.0000 mg | ORAL_TABLET | Freq: Every day | ORAL | Status: DC
  Administered 2015-11-13 – 2015-11-16 (×4): 20 mg via ORAL
  Filled 2015-11-13 (×5): qty 1

## 2015-11-13 MED ORDER — FUROSEMIDE 10 MG/ML IJ SOLN: 20.0000 mg | Freq: Every day | INTRAMUSCULAR | Status: DC

## 2015-11-13 NOTE — H&P (Signed)
[facility] History and Physical  [name] [id number] DOB: 09-20-1924 DOA: 11/12/2015  Referring physician: ED physician PCP: [name], MD  Specialists: Dr. Henrene Pastor (GI)  Chief Complaint:  Back pain, SOB, leg swelling   HPI: [name] is a 82 y.o. male with PMH of hypertension, hyperlipidemia, and chronic diastolic CHF who presents to the ED with 2-3 days of progressive dyspnea and bilateral lower extremity edema, as well as severe mid back pain following a fall 2 days ago. Patient lives at home with his wife and is typically able to manage his ADLs, but has noted increasing difficulty over the past 2 days due to dyspnea with minimal exertion. Over the same interval, he has noted edema of the bilateral lower extremities. He denies any chest pain or palpitations, and denies cough. He denies fevers, chills, sick contacts, or recent long distance travel. There is been no rhinorrhea or sore throat. His dyspnea is worse with exertion, and also worse with lying flat. He denies change in his medications, diet, or fluid intake.  In ED, patient was found to be afebrile, requiring 2 L supplemental oxygen to maintain saturation in the 90s, but with vital signs otherwise stable. Chest x-ray was negative for acute cardiopulmonary disease, EKG demonstrated sinus rhythm with APCs, and initial blood work is notable for leukocytosis of 11,900. CT of the abdomen/pelvis was obtained and reveals an acute compression fracture of the T12 vertebrae with moderate height loss and mild retropulsion. Patient was given a dose of ibuprofen for his back pain and an IV push of 40 mg Lasix for apparent volume overload. The patient attempted to ambulate in the emergency department but O2 saturation quickly dropped to the 80s. Patient will be admitted to the hospital for ongoing evaluation and management of acute hypoxic respiratory failure secondary to an acute exacerbation of chronic diastolic CHF.  Where  does patient live?   At home     Can patient participate in ADLs?  Yes        Review of Systems:   General: no fevers, chills, sweats, weight change, poor appetite, or fatigue HEENT: no blurry vision, hearing changes or sore throat Pulm: no cough, or wheeze. Dyspnea  CV: no chest pain or palpitations Abd: no nausea, vomiting, abdominal pain, diarrhea, or constipation. Abdominal distension GU: no hematuria, increased urinary frequency, or urgency. Chronic dysuria  Ext:  Worsening b/l LE edema  Neuro: no focal weakness, numbness, or tingling, no vision change or hearing loss Skin: no rash, no wounds MSK: No muscle spasm, no deformity, no red, hot, or swollen joint Heme: No easy bruising or bleeding Travel history: No recent long distant travel    Allergy: No Known Allergies  Past Medical History  Diagnosis Date  . Hypertension   . Hyperlipidemia   . Tobacco abuse   . Lung nodule     Left lung, seen 2012, no change in 2012, no follow up needed   . PNA (pneumonia) 2010    Bilateral Pneumonia, COPD 86mm LLL Nodule   . History of ETT 09/1991     POS   . History of MRI 04-22-06    L/S- right hnp  l5/si   . B12 deficiency   . TIA (transient ischemic attack)   . GERD (gastroesophageal reflux disease)   . Esophageal stricture   . Hemorrhoids   . Colon polyps     hyperplastic  . Pneumonia   . Macular degeneration   . Stroke [facility])  Past Surgical History  Procedure Laterality Date  . Cystectomy  1995    Lumbar area   . Hip pinning,cannulated Left 03/28/2015    Procedure: CANNULATED HIP PINNING;  Surgeon: [name], MD;  Location: [facility]S;  Service: Orthopedics;  Laterality: Left;    Social History:  reports that he quit smoking about 30 years ago. His smoking use included Cigarettes. He has quit using smokeless tobacco. His smokeless tobacco use included Chew. He reports that he does not drink alcohol or use illicit drugs.  Family History:  Family History  Problem  Relation Age of Onset  . Hypertension Mother   . Heart attack Father   . Colon cancer Neg Hx   . Esophageal cancer Neg Hx      Prior to Admission medications   Medication Sig Start Date End Date Taking? Authorizing Provider  albuterol (PROVENTIL HFA;VENTOLIN HFA) 108 (90 BASE) MCG/ACT inhaler Inhale 2 puffs into the lungs every 6 (six) hours as needed for wheezing or shortness of breath. 07/31/15  Yes Tonia Ghent, MD  amLODipine (NORVASC) 10 MG tablet Take 1 tablet (10 mg total) by mouth daily. 10/15/15  Yes Tonia Ghent, MD  neomycin-polymyxin b-dexamethasone (MAXITROL) 3.5-10000-0.1 OINT Place 1 application into both eyes 2 (two) times daily. 10/22/15  Yes Historical Provider, MD  simvastatin (ZOCOR) 20 MG tablet Take 1 tablet (20 mg total) by mouth daily. 10/15/15  Yes Tonia Ghent, MD  tamsulosin (FLOMAX) 0.4 MG CAPS capsule Take 1 capsule (0.4 mg total) by mouth daily. 10/20/15  Yes Tonia Ghent, MD  tobramycin (TOBREX) 0.3 % ophthalmic solution Place 1 drop into both eyes 2 (two) times daily.  02/27/15  Yes Historical Provider, MD  traMADol (ULTRAM) 50 MG tablet Take 1 tablet (50 mg total) by mouth every 8 (eight) hours as needed. 11/11/15  Yes Tonia Ghent, MD  aspirin EC 325 MG tablet Take 1 tablet (325 mg total) by mouth daily. Patient not taking: Reported on 11/12/2015 08/13/15   Modena Jansky, MD  docusate sodium (COLACE) 100 MG capsule Take 1 capsule (100 mg total) by mouth 2 (two) times daily. Patient not taking: Reported on 11/12/2015 03/30/15   Modena Jansky, MD  Multiple Vitamins-Minerals (PRESERVISION AREDS 2) CAPS Take 1 tablet by mouth 2 (two) times daily. Take as directed. Patient not taking: Reported on 11/12/2015 03/30/15   Modena Jansky, MD  pantoprazole (PROTONIX) 40 MG tablet Take 1 tablet (40 mg total) by mouth at bedtime. Patient not taking: Reported on 11/12/2015 10/15/15   Tonia Ghent, MD  solifenacin (VESICARE) 5 MG tablet Take 1 tablet (5 mg total)  by mouth daily. 11/11/15   Tonia Ghent, MD    Physical Exam: Filed Vitals:   11/12/15 2118 11/12/15 2120 11/12/15 2310 11/12/15 2356  BP:   183/85 162/82  Pulse:   92 98  Temp:   98.2 F (36.8 C) 97.7 F (36.5 C)  TempSrc:   Oral Oral  Resp:   18 18  Height:    5\' 5"  (1.651 m)  Weight:    62.3 kg (137 lb 5.6 oz)  SpO2: 92% 96% 94% 98%   General: Not in acute distress HEENT:       Eyes: PERRL, EOMI, no scleral icterus or conjunctival pallor.       ENT: No discharge from the ears or nose, no pharyngeal ulcers, petechiae or exudate, no tonsillar enlargement.        Neck: No  JVD, no bruit, no appreciable mass Heme: No cervical adenopathy, no pallor Cardiac: S1/S2, RRR, No gallops or rubs. Pulm: Good air movement bilaterally. Crackles bilaterally.  Abd: Soft, moderately distended, nontender, no rebound pain or gaurding, no mass or organomegaly, BS present. Ext:  2+DP/PT pulse bilaterally. Bilateral LE pitting edema to the thighs.  Musculoskeletal: No gross deformity, no red, hot, swollen joints. Bony tenderness over mid-back Skin: No rashes or wounds on exposed surfaces  Neuro: Alert, oriented X3, cranial nerves II-XII grossly intact. No focal findings Psych: Patient is not overtly psychotic, appropriate mood and affect.  Labs on Admission:  Basic Metabolic Panel:  Recent Labs Lab 11/12/15 1924  NA 136  K 4.2  CL 99*  CO2 27  GLUCOSE 123*  BUN 32*  CREATININE 1.12  CALCIUM 9.2   Liver Function Tests:  Recent Labs Lab 11/12/15 1924  AST 38  ALT 39  ALKPHOS 107  BILITOT 0.9  PROT 8.0  ALBUMIN 4.1   No results for input(s): LIPASE, AMYLASE in the last 168 hours. No results for input(s): AMMONIA in the last 168 hours. CBC:  Recent Labs Lab 11/12/15 1924  WBC 11.9*  HGB 13.1  HCT 39.1  MCV 80.8  PLT 280   Cardiac Enzymes: No results for input(s): CKTOTAL, CKMB, CKMBINDEX, TROPONINI in the last 168 hours.  BNP (last 3 results)  Recent Labs   03/31/15 0750 11/12/15 2145  BNP 167.4* 161.2*    ProBNP (last 3 results) No results for input(s): PROBNP in the last 8760 hours.  CBG: No results for input(s): GLUCAP in the last 168 hours.  Radiological Exams on Admission: Dg Chest 2 View  11/12/2015  CLINICAL DATA:  Shortness of breath, productive cough. EXAM: CHEST  2 VIEW COMPARISON:  August 14, 2015 FINDINGS: The heart size and mediastinal contours are stable. There is no focal infiltrate, pulmonary edema, or pleural effusion. With minimal atelectasis of both lung bases are noted. The visualized skeletal structures are unremarkable. IMPRESSION: No active cardiopulmonary disease. Electronically Signed   By: Abelardo Diesel M.D.   On: 11/12/2015 20:27   Ct Abdomen Pelvis W Contrast  11/12/2015  CLINICAL DATA:  Abdominal distention. EXAM: CT ABDOMEN AND PELVIS WITH CONTRAST TECHNIQUE: Multidetector CT imaging of the abdomen and pelvis was performed using the standard protocol following bolus administration of intravenous contrast. CONTRAST:  129mL OMNIPAQUE IOHEXOL 300 MG/ML  SOLN COMPARISON:  None. FINDINGS: Lower chest and abdominal wall: Bibasilar emphysema. Extensive atherosclerotic calcification. Fatty left inguinal hernia. Hepatobiliary: Few and small incidental cysts in the liver.Cholelithiasis without signs of obstruction or inflammation. Pancreas: Unremarkable. Spleen: Unremarkable. Adrenals/Urinary Tract: Negative adrenals. Renal atrophy. Bilateral renal cysts; the largest is exophytic from the right lower pole measuring 53 mm and with benign peripheral calcification. No hydronephrosis or ureteral calculus. Unremarkable bladder. Reproductive:Symmetric prostate enlargement. Stomach/Bowel: Large volume of gas distending the entire colon. Stool retention is mild. No volvulus or impaction. No definitive over distension as usually seen with colonic ileus. No appendicitis. Vascular/Lymphatic: Advanced and diffuse atherosclerotic disease with  29 mm aortic terminus fusiform aneurysm. No acute vascular finding. No mass or adenopathy. Peritoneal: No ascites or pneumoperitoneum. Musculoskeletal: Remote left femoral neck fracture status post ORIF. A portion of the fracture is still visible but there is bridging bone. T12 compression fracture with acute appearance. Height loss is approximately 50%. Mild posterior retropulsion without canal stenosis. No subluxation or posterior element fracturing. Lumbar facet arthropathy with L4-5 and L5-S1 anterolisthesis. IMPRESSION: 1. Acute T12 compression fracture with  moderate height loss and mild retropulsion. 2. Abdominal distention correlates with diffuse gaseous distention of colon. No mechanical obstruction. 3. Chronic findings are described above. Electronically Signed   By: Monte Fantasia M.D.   On: 11/12/2015 22:28    EKG: Independently reviewed.  Abnormal findings:  Sinus rhythm, APCs  Assessment/Plan  1. Acute on chronic diastolic CHF  - Presents with dyspnea and marked peripheral edema  - BNP mildly elevated  - TTE (08/12/15) with EF 55-60%, moderate LVH, grade 1 diastolic dysfunction  - Received 1 dose Lasix 40 mg IV in ED and has only put out 350 cc  - Will repeat Lasix now, follow strict I/O, daily wts  - SLIV, fluid-restrict diet    2. Acute hypoxic respiratory failure  - Suspected secondary to #1 above  - Quickly desaturates to 80s with minimal exertion on rm air  - Diurese as above  - Continuous pulse oximetry with titration of FiO2 to maintain sat >92%   3. Acute compression fracture, T-12 - Suffered ground-level mechanical fall 2 days ago - Pain is tolerable at rest, much worse with any movement  - Pain-control with home-dose tramadol prn mild-mod pain, morphine prn severe pain   4. Leukocytosis - WBC 11,900 on admission  - No evidence of bacterial infection identified  - Possibly a stress demargination reaction to fracture  - Monitor off abx, culture if febrile      DVT ppx:  SQ Lovenox     Code Status: Full code Family Communication: None at bed side.                Disposition Plan: Admit to inpatient   Date of Service 11/13/2015    Vianne Bulls, MD [facility] Pager [phone]  If 7PM-7AM, please contact night-coverage [email or web address] Password [facility] 11/13/2015, 12:34 AM

## 2015-11-13 NOTE — Progress Notes (Signed)
Sent a message to [facility] of pt's HR elevated for a run of 150-180 and right back to a rate of 85. Asymptomatic voice no complaints

## 2015-11-13 NOTE — Progress Notes (Signed)
PROGRESS NOTE  [name] [id number] DOB: 02/18/1925 DOA: 11/12/2015 PCP: [name], MD Brief History 82 y.o. male with PMH of hypertension, hyperlipidemia, and chronic diastolic CHF who presents to the ED with 2-3 days of progressive dyspnea and bilateral lower extremity edema, as well as severe mid back pain following a fall 2 days ago. Patient lives at home with his wife and is typically able to manage his ADLs, but has noted increasing difficulty over the past 2 days due to dyspnea with minimal exertion. Over the same interval, he has noted edema of the bilateral lower extremities. He denies any chest pain or palpitations, and denies cough. He denies fevers, chills, sick contacts, or recent long distance travel.  In ED, patient was found to be afebrile, requiring 2 L supplemental oxygen to maintain saturation in the 90s, but with vital signs otherwise stable. Chest x-ray was negative for acute cardiopulmonary disease, EKG demonstrated sinus rhythm with APCs, and initial blood work is notable for leukocytosis of 11,900. CT of the abdomen/pelvis was obtained and reveals an acute compression fracture of the T12 vertebrae with moderate height loss and mild retropulsion. Patient was given a dose of ibuprofen for his back pain and an IV push of 40 mg Lasix for apparent volume overload.   Assessment/Plan: Acute on chronic diastolic CHF  - Presents with dyspnea and marked peripheral edema  - Suspect right-sided CHF  - TTE (08/12/15) with EF 55-60%, moderate LVH, grade 1 diastolic dysfunction  - Continue intravenous furosemide - Daily weights - SLIV, fluid-restrict diet  - NEG 2.1 L for the admission  Acute hypoxic respiratory failure  - Suspected secondary to #1 above and is sitting underlying COPD -Patient has greater than 50 pack year history - Quickly desaturates to 80s with minimal exertion on rm air  - Presently stable on 2 L at rest  Acute compression  fracture, T-12 - Suffered ground-level mechanical fall 2 days ago - Pain is tolerable at rest, much worse with any movement  - Pain-control with home-dose tramadol prn mild-mod pain, morphine prn severe pain   Leukocytosis - WBC 11,900 on admission  - No evidence of bacterial infection identified  - Possibly a stress demargination reaction to fracture  - Monitor off abx, culture if febrile  -Urinalysis negative for pyuria -Chest x-ray negative for infiltrates  Hypertension -Continue amlodipine and lisinopril -Expect improvement with diuresis  Family Communication:   Wife updated at beside 11/13/15 Disposition Plan:   SNF 1-2 days       Procedures/Studies: Dg Chest 2 View  11/12/2015  CLINICAL DATA:  Shortness of breath, productive cough. EXAM: CHEST  2 VIEW COMPARISON:  August 14, 2015 FINDINGS: The heart size and mediastinal contours are stable. There is no focal infiltrate, pulmonary edema, or pleural effusion. With minimal atelectasis of both lung bases are noted. The visualized skeletal structures are unremarkable. IMPRESSION: No active cardiopulmonary disease. Electronically Signed   By: Abelardo Diesel M.D.   On: 11/12/2015 20:27   Ct Abdomen Pelvis W Contrast  11/12/2015  CLINICAL DATA:  Abdominal distention. EXAM: CT ABDOMEN AND PELVIS WITH CONTRAST TECHNIQUE: Multidetector CT imaging of the abdomen and pelvis was performed using the standard protocol following bolus administration of intravenous contrast. CONTRAST:  141mL OMNIPAQUE IOHEXOL 300 MG/ML  SOLN COMPARISON:  None. FINDINGS: Lower chest and abdominal wall: Bibasilar emphysema. Extensive atherosclerotic calcification. Fatty left inguinal hernia. Hepatobiliary: Few and small incidental cysts in the liver.Cholelithiasis  without signs of obstruction or inflammation. Pancreas: Unremarkable. Spleen: Unremarkable. Adrenals/Urinary Tract: Negative adrenals. Renal atrophy. Bilateral renal cysts; the largest is exophytic  from the right lower pole measuring 53 mm and with benign peripheral calcification. No hydronephrosis or ureteral calculus. Unremarkable bladder. Reproductive:Symmetric prostate enlargement. Stomach/Bowel: Large volume of gas distending the entire colon. Stool retention is mild. No volvulus or impaction. No definitive over distension as usually seen with colonic ileus. No appendicitis. Vascular/Lymphatic: Advanced and diffuse atherosclerotic disease with 29 mm aortic terminus fusiform aneurysm. No acute vascular finding. No mass or adenopathy. Peritoneal: No ascites or pneumoperitoneum. Musculoskeletal: Remote left femoral neck fracture status post ORIF. A portion of the fracture is still visible but there is bridging bone. T12 compression fracture with acute appearance. Height loss is approximately 50%. Mild posterior retropulsion without canal stenosis. No subluxation or posterior element fracturing. Lumbar facet arthropathy with L4-5 and L5-S1 anterolisthesis. IMPRESSION: 1. Acute T12 compression fracture with moderate height loss and mild retropulsion. 2. Abdominal distention correlates with diffuse gaseous distention of colon. No mechanical obstruction. 3. Chronic findings are described above. Electronically Signed   By: Monte Fantasia M.D.   On: 11/12/2015 22:28         Subjective: Patient's pain is better controlled but worsened with activity. He is breathing better but still having dyspnea on exertion. Denies any fevers, chills, chest pain, nausea, vomiting, diarrhea, abdominal pain, dysuria, hematuria.  Objective: Filed Vitals:   11/13/15 0221 11/13/15 0628 11/13/15 1029 11/13/15 1040  BP: 152/76 158/67 131/73 131/73  Pulse: 82 78 75   Temp: 98.1 F (36.7 C) 97.6 F (36.4 C) 98.1 F (36.7 C)   TempSrc: Oral Oral Oral   Resp: 18 18    Height:      Weight:      SpO2: 98% 98% 98%     Intake/Output Summary (Last 24 hours) at 11/13/15 1144 Last data filed at 11/13/15 0845  Gross per  24 hour  Intake    240 ml  Output   2145 ml  Net  -1905 ml   Weight change:  Exam:   General:  Pt is alert, follows commands appropriately, not in acute distress  HEENT: No icterus, No thrush, No neck mass, [place]/AT  Cardiovascular: RRR, S1/S2, no rubs, no gallops  Respiratory: Diminished breath sounds but clear to auscultation  Abdomen: Soft/+BS, non tender, non distended, no guarding; no hepatosplenomegaly  Extremities: 1+ LE edema, No lymphangitis, No petechiae, No rashes, no synovitis  Data Reviewed: Basic Metabolic Panel:  Recent Labs Lab 11/12/15 1924  NA 136  K 4.2  CL 99*  CO2 27  GLUCOSE 123*  BUN 32*  CREATININE 1.12  CALCIUM 9.2   Liver Function Tests:  Recent Labs Lab 11/12/15 1924  AST 38  ALT 39  ALKPHOS 107  BILITOT 0.9  PROT 8.0  ALBUMIN 4.1   No results for input(s): LIPASE, AMYLASE in the last 168 hours. No results for input(s): AMMONIA in the last 168 hours. CBC:  Recent Labs Lab 11/12/15 1924  WBC 11.9*  HGB 13.1  HCT 39.1  MCV 80.8  PLT 280   Cardiac Enzymes: No results for input(s): CKTOTAL, CKMB, CKMBINDEX, TROPONINI in the last 168 hours. BNP: Invalid input(s): POCBNP CBG: No results for input(s): GLUCAP in the last 168 hours.  No results found for this or any previous visit (from the past 240 hour(s)).   Scheduled Meds: . amLODipine  10 mg Oral Daily  . antiseptic oral rinse  7  mL Mouth Rinse BID  . darifenacin  7.5 mg Oral Daily  . enoxaparin (LOVENOX) injection  40 mg Subcutaneous Q24H  . furosemide  40 mg Intravenous Daily  . lisinopril  2.5 mg Oral Daily  . pantoprazole  40 mg Oral QHS  . simethicone  40 mg Oral QID  . simvastatin  20 mg Oral Daily  . sodium chloride flush  3 mL Intravenous Q12H  . tamsulosin  0.4 mg Oral Daily  . tobramycin  1 drop Both Eyes BID   Continuous Infusions:    Zimir Kittleson, DO  [facility] Pager [phone]  If 7PM-7AM, please contact  night-coverage [email or web address] Password [facility] 11/13/2015, 11:44 AM

## 2015-11-13 NOTE — Progress Notes (Signed)
Pt arrived in room 1432 from [place] taken, tele applied. Call bell within reach. Will continue to monitor.

## 2015-11-14 ENCOUNTER — Observation Stay (HOSPITAL_BASED_OUTPATIENT_CLINIC_OR_DEPARTMENT_OTHER): Payer: Medicare Other

## 2015-11-14 DIAGNOSIS — M4854XD Collapsed vertebra, not elsewhere classified, thoracic region, subsequent encounter for fracture with routine healing: Secondary | ICD-10-CM | POA: Diagnosis not present

## 2015-11-14 DIAGNOSIS — I5033 Acute on chronic diastolic (congestive) heart failure: Secondary | ICD-10-CM | POA: Diagnosis not present

## 2015-11-14 DIAGNOSIS — M79606 Pain in leg, unspecified: Secondary | ICD-10-CM

## 2015-11-14 DIAGNOSIS — I501 Left ventricular failure: Secondary | ICD-10-CM | POA: Diagnosis not present

## 2015-11-14 DIAGNOSIS — J9601 Acute respiratory failure with hypoxia: Secondary | ICD-10-CM | POA: Diagnosis not present

## 2015-11-14 LAB — URINE CULTURE: CULTURE: NO GROWTH

## 2015-11-14 LAB — CBC
HCT: 33.6 % — ABNORMAL LOW (ref 39.0–52.0)
HEMOGLOBIN: 11.2 g/dL — AB (ref 13.0–17.0)
MCH: 27 pg (ref 26.0–34.0)
MCHC: 33.3 g/dL (ref 30.0–36.0)
MCV: 81 fL (ref 78.0–100.0)
PLATELETS: 263 10*3/uL (ref 150–400)
RBC: 4.15 MIL/uL — AB (ref 4.22–5.81)
RDW: 15.5 % (ref 11.5–15.5)
WBC: 9.2 10*3/uL (ref 4.0–10.5)

## 2015-11-14 LAB — ECHOCARDIOGRAM COMPLETE
Height: 65 in
WEIGHTICAEL: 2102.31 [oz_av]

## 2015-11-14 LAB — BASIC METABOLIC PANEL
ANION GAP: 12 (ref 5–15)
BUN: 45 mg/dL — ABNORMAL HIGH (ref 6–20)
CALCIUM: 8.4 mg/dL — AB (ref 8.9–10.3)
CO2: 32 mmol/L (ref 22–32)
Chloride: 93 mmol/L — ABNORMAL LOW (ref 101–111)
Creatinine, Ser: 2.1 mg/dL — ABNORMAL HIGH (ref 0.61–1.24)
GFR, EST AFRICAN AMERICAN: 31 mL/min — AB (ref >60)
GFR, EST NON AFRICAN AMERICAN: 26 mL/min — AB (ref >60)
GLUCOSE: 97 mg/dL (ref 65–99)
Potassium: 3.6 mmol/L (ref 3.5–5.1)
Sodium: 137 mmol/L (ref 135–145)

## 2015-11-14 LAB — CBC AND DIFFERENTIAL: WBC: 9.2 10^3/mL

## 2015-11-14 MED ORDER — PERFLUTREN LIPID MICROSPHERE
1.0000 mL | INTRAVENOUS | Status: AC | PRN
  Administered 2015-11-14: 2 mL via INTRAVENOUS
  Filled 2015-11-14: qty 10

## 2015-11-14 MED ORDER — DOCUSATE SODIUM 100 MG PO CAPS
100.0000 mg | ORAL_CAPSULE | Freq: Two times a day (BID) | ORAL | Status: DC
  Administered 2015-11-14 – 2015-11-16 (×5): 100 mg via ORAL
  Filled 2015-11-14 (×5): qty 1

## 2015-11-14 MED ORDER — ENOXAPARIN SODIUM 30 MG/0.3ML ~~LOC~~ SOLN
30.0000 mg | SUBCUTANEOUS | Status: DC
  Administered 2015-11-15 – 2015-11-16 (×2): 30 mg via SUBCUTANEOUS
  Filled 2015-11-14 (×2): qty 0.3

## 2015-11-14 MED ORDER — SENNA 8.6 MG PO TABS
2.0000 | ORAL_TABLET | Freq: Every day | ORAL | Status: DC
  Administered 2015-11-14 – 2015-11-16 (×3): 17.2 mg via ORAL
  Filled 2015-11-14 (×3): qty 2

## 2015-11-14 NOTE — Progress Notes (Signed)
PROGRESS NOTE  [name] [id number] DOB: 1924-12-20 DOA: 11/12/2015 PCP: [name], MD  Brief History 82 y.o. male with PMH of hypertension, hyperlipidemia, and chronic diastolic CHF who presents to the ED with 2-3 days of progressive dyspnea and bilateral lower extremity edema, as well as severe mid back pain following a fall 2 days ago. Patient lives at home with his wife and is typically able to manage his ADLs, but has noted increasing difficulty over the past 2 days due to dyspnea with minimal exertion. Over the same interval, he has noted edema of the bilateral lower extremities. He denies any chest pain or palpitations, and denies cough. He denies fevers, chills, sick contacts, or recent long distance travel.  In ED, patient was found to be afebrile, requiring 2 L supplemental oxygen to maintain saturation in the 90s, but with vital signs otherwise stable. Chest x-ray was negative for acute cardiopulmonary disease, EKG demonstrated sinus rhythm with APCs, and initial blood work is notable for leukocytosis of 11,900. CT of the abdomen/pelvis was obtained and reveals an acute compression fracture of the T12 vertebrae with moderate height loss and mild retropulsion. Patient was given a dose of ibuprofen for his back pain and an IV push of 40 mg Lasix for apparent volume overload.   Assessment/Plan: Acute on chronic diastolic CHF  - Presents with dyspnea and marked peripheral edema  - Suspect right-sided CHF  - TTE (08/12/15) with EF 55-60%, moderate LVH, grade 1 diastolic dysfunction  - D/C lasix IV - Daily weights - SLIV, fluid-restrict diet  - NEG 3.3 L for the admission -NEG 6 pounds  Acute hypoxic respiratory failure  - Suspected secondary to #1 above in setting of underlying COPD -Patient has greater than 50 pack year history - Quickly desaturates to 80s with minimal exertion on rm air  - Presently stable on 2 L at rest  Acute compression fracture,  T-12 - Suffered ground-level mechanical fall 2 days ago - Pain is tolerable at rest, much worse with any movement  - Pain-control with home-dose tramadol prn mild-mod pain, morphine prn severe pain   Leukocytosis - WBC 11,900 on admission  - No evidence of bacterial infection identified  - Possibly a stress demargination reaction to fracture  - Monitor off abx, culture if febrile  -Urinalysis negative for pyuria -Chest x-ray negative for infiltrates -Improved without antibiotics  Hypertension -hold amlodipine -hold lisinopril in setting of increasing serum creatinine  SVT -11/13/2015 pt had brief episode of SVT -Metoprolol 12.5 mg twice a day  Constipation -start cathartics  Family Communication: Wife updated at beside 11/13/15 Disposition Plan: SNF 1-2 days   Procedures/Studies: Dg Chest 2 View  11/12/2015  CLINICAL DATA:  Shortness of breath, productive cough. EXAM: CHEST  2 VIEW COMPARISON:  August 14, 2015 FINDINGS: The heart size and mediastinal contours are stable. There is no focal infiltrate, pulmonary edema, or pleural effusion. With minimal atelectasis of both lung bases are noted. The visualized skeletal structures are unremarkable. IMPRESSION: No active cardiopulmonary disease. Electronically Signed   By: Abelardo Diesel M.D.   On: 11/12/2015 20:27   Ct Abdomen Pelvis W Contrast  11/12/2015  CLINICAL DATA:  Abdominal distention. EXAM: CT ABDOMEN AND PELVIS WITH CONTRAST TECHNIQUE: Multidetector CT imaging of the abdomen and pelvis was performed using the standard protocol following bolus administration of intravenous contrast. CONTRAST:  142mL OMNIPAQUE IOHEXOL 300 MG/ML  SOLN COMPARISON:  None. FINDINGS: Lower chest and  abdominal wall: Bibasilar emphysema. Extensive atherosclerotic calcification. Fatty left inguinal hernia. Hepatobiliary: Few and small incidental cysts in the liver.Cholelithiasis without signs of obstruction or inflammation. Pancreas:  Unremarkable. Spleen: Unremarkable. Adrenals/Urinary Tract: Negative adrenals. Renal atrophy. Bilateral renal cysts; the largest is exophytic from the right lower pole measuring 53 mm and with benign peripheral calcification. No hydronephrosis or ureteral calculus. Unremarkable bladder. Reproductive:Symmetric prostate enlargement. Stomach/Bowel: Large volume of gas distending the entire colon. Stool retention is mild. No volvulus or impaction. No definitive over distension as usually seen with colonic ileus. No appendicitis. Vascular/Lymphatic: Advanced and diffuse atherosclerotic disease with 29 mm aortic terminus fusiform aneurysm. No acute vascular finding. No mass or adenopathy. Peritoneal: No ascites or pneumoperitoneum. Musculoskeletal: Remote left femoral neck fracture status post ORIF. A portion of the fracture is still visible but there is bridging bone. T12 compression fracture with acute appearance. Height loss is approximately 50%. Mild posterior retropulsion without canal stenosis. No subluxation or posterior element fracturing. Lumbar facet arthropathy with L4-5 and L5-S1 anterolisthesis. IMPRESSION: 1. Acute T12 compression fracture with moderate height loss and mild retropulsion. 2. Abdominal distention correlates with diffuse gaseous distention of colon. No mechanical obstruction. 3. Chronic findings are described above. Electronically Signed   By: Monte Fantasia M.D.   On: 11/12/2015 22:28        Subjective:   Objective: Filed Vitals:   11/13/15 1040 11/13/15 1328 11/13/15 2139 11/14/15 0549  BP: 131/73 113/58 108/59 100/53  Pulse:  88 70 58  Temp:  98.3 F (36.8 C) 97.3 F (36.3 C) 97.6 F (36.4 C)  TempSrc:  Oral Oral Oral  Resp:  18 18 18   Height:      Weight:    59.6 kg (131 lb 6.3 oz)  SpO2:  96% 96% 94%    Intake/Output Summary (Last 24 hours) at 11/14/15 1414 Last data filed at 11/14/15 0955  Gross per 24 hour  Intake    480 ml  Output    400 ml  Net     80 ml    Weight change: -2.7 kg (-5 lb 15.2 oz) Exam:   General:  Pt is alert, follows commands appropriately, not in acute distress  HEENT: No icterus, No thrush, No neck mass, [place]/AT  Cardiovascular: RRR, S1/S2, no rubs, no gallops  Respiratory: Diminished breath sounds at the bases but clear to auscultation. No wheezing  Abdomen: Soft/+BS, non tender, non distended, no guarding  Extremities: No edema, No lymphangitis, No petechiae, No rashes, no synovitis  Data Reviewed: Basic Metabolic Panel:  Recent Labs Lab 11/12/15 1924 11/14/15 0527  NA 136 137  K 4.2 3.6  CL 99* 93*  CO2 27 32  GLUCOSE 123* 97  BUN 32* 45*  CREATININE 1.12 2.10*  CALCIUM 9.2 8.4*   Liver Function Tests:  Recent Labs Lab 11/12/15 1924  AST 38  ALT 39  ALKPHOS 107  BILITOT 0.9  PROT 8.0  ALBUMIN 4.1   No results for input(s): LIPASE, AMYLASE in the last 168 hours. No results for input(s): AMMONIA in the last 168 hours. CBC:  Recent Labs Lab 11/12/15 1924 11/14/15 0527  WBC 11.9* 9.2  HGB 13.1 11.2*  HCT 39.1 33.6*  MCV 80.8 81.0  PLT 280 263   Cardiac Enzymes: No results for input(s): CKTOTAL, CKMB, CKMBINDEX, TROPONINI in the last 168 hours. BNP: Invalid input(s): POCBNP CBG: No results for input(s): GLUCAP in the last 168 hours.  Recent Results (from the past 240 hour(s))  Urine culture  Status: None   Collection Time: 11/13/15  6:35 AM  Result Value Ref Range Status   Specimen Description URINE, CLEAN CATCH  Final   Special Requests NONE  Final   Culture   Final    NO GROWTH 1 DAY Performed at [facility]    Report Status 11/14/2015 FINAL  Final     Scheduled Meds: . antiseptic oral rinse  7 mL Mouth Rinse BID  . darifenacin  7.5 mg Oral Daily  . docusate sodium  100 mg Oral BID  . enoxaparin (LOVENOX) injection  40 mg Subcutaneous Q24H  . metoprolol tartrate  12.5 mg Oral BID  . pantoprazole  40 mg Oral QHS  . senna  2 tablet Oral Daily  .  simethicone  40 mg Oral QID  . simvastatin  20 mg Oral Daily  . sodium chloride flush  3 mL Intravenous Q12H  . tamsulosin  0.4 mg Oral Daily  . tobramycin  1 drop Both Eyes BID   Continuous Infusions:    Gumecindo Hopkin, DO  [facility] Pager [phone]  If 7PM-7AM, please contact night-coverage [email or web address] Password [facility] 11/14/2015, 2:14 PM

## 2015-11-14 NOTE — Progress Notes (Signed)
VASCULAR LAB PRELIMINARY  PRELIMINARY  PRELIMINARY  PRELIMINARY  Left lower extremity venous duplex completed.      Left:  No evidence of DVT, superficial thrombosis, or Baker's cyst.   Janifer Adie, RVT, RDMS 11/14/2015, 2:55 PM

## 2015-11-14 NOTE — Progress Notes (Signed)
  Echocardiogram 2D Echocardiogram has been performed.  [name] 11/14/2015, 11:34 AM

## 2015-11-14 NOTE — Evaluation (Signed)
Physical Therapy Evaluation Patient Details Name: [name] MRN: [id number] DOB: 11-01-1924 Today's Date: 11/14/2015   History of Present Illness  82 yo male admitted with fall, acute respiratory failure, acute T12 comp fx, bil LE edema. Hx of HTN, TIA, CVA, macular degeneration  Clinical Impression  On eval, pt required Min-Mod assist for mobility-walked ~7 feet in room with Rw. O2 sats 88% on RA during ambulation; 92% Ra at rest. [place] O2 replaced end of session.  Recommend ST rehab at SNF.     Follow Up Recommendations SNF    Equipment Recommendations  None recommended by PT    Recommendations for Other Services       Precautions / Restrictions Precautions Precautions: Fall Restrictions Weight Bearing Restrictions: No      Mobility  Bed Mobility Overal bed mobility: Needs Assistance Bed Mobility: Supine to Sit     Supine to sit: Min assist;HOB elevated     General bed mobility comments: Assist for trunk and scoot to EOB. Increased time.   Transfers Overall transfer level: Needs assistance Equipment used: Rolling walker (2 wheeled) Transfers: Sit to/from Stand Sit to Stand: Mod assist;From elevated surface         General transfer comment: Assist to rise, stabilize, control descent. VCs safety, hand placement. Increased time.   Ambulation/Gait Ambulation/Gait assistance: Min assist Ambulation Distance (Feet): 7 Feet Assistive device: Rolling walker (2 wheeled) Gait Pattern/deviations: Trunk flexed;Decreased stride length;Step-through pattern     General Gait Details: Assist to stabilize pt. Followed with recliner. Pt only able to tolerate taking a few steps in room on today. O2 sats 88% on RA  Stairs            Wheelchair Mobility    Modified Rankin (Stroke Patients Only)       Balance                                             Pertinent Vitals/Pain Pain Assessment: Faces Faces Pain Scale: Hurts even more Pain  Location: back with activity Pain Descriptors / Indicators: Sharp;Sore Pain Intervention(s): Limited activity within patient's tolerance;Repositioned    Home Living Family/patient expects to be discharged to:: Private residence Living Arrangements: Spouse/significant other Available Help at Discharge: Family;Available 24 hours/day Type of Home: House Home Access: Stairs to enter Entrance Stairs-Rails: Right Entrance Stairs-Number of Steps: 4 Home Layout: One level Home Equipment: Walker - 2 wheels;Cane - single point;Bedside commode;Shower seat;Wheelchair - manual      Prior Function Level of Independence: Needs assistance   Gait / Transfers Assistance Needed: ambulates with walker  ADL's / Homemaking Assistance Needed: wife helps with bathing, dressing        Hand Dominance        Extremity/Trunk Assessment   Upper Extremity Assessment: Generalized weakness           Lower Extremity Assessment: Generalized weakness      Cervical / Trunk Assessment: Kyphotic  Communication   Communication: HOH  Cognition Arousal/Alertness: Awake/alert Behavior During Therapy: WFL for tasks assessed/performed Overall Cognitive Status: Within Functional Limits for tasks assessed                      General Comments      Exercises        Assessment/Plan    PT Assessment Patient needs continued PT services  PT Diagnosis  Difficulty walking;Acute pain;Generalized weakness   PT Problem List Decreased strength;Decreased activity tolerance;Decreased balance;Decreased mobility;Pain;Decreased knowledge of use of DME  PT Treatment Interventions DME instruction;Gait training;Functional mobility training;Patient/family education;Therapeutic activities;Balance training;Therapeutic exercise   PT Goals (Current goals can be found in the Care Plan section) Acute Rehab PT Goals Patient Stated Goal: less pain. walk.  PT Goal Formulation: With patient Time For Goal Achievement:  11/28/15 Potential to Achieve Goals: Good    Frequency Min 3X/week   Barriers to discharge        Co-evaluation               End of Session Equipment Utilized During Treatment: Gait belt Activity Tolerance: Patient limited by fatigue;Patient limited by pain Patient left: in chair;with call bell/phone within reach;with chair alarm set           Time: [id number] PT Time Calculation (min) (ACUTE ONLY): 17 min   Charges:   PT Evaluation $PT Eval Moderate Complexity: 1 Procedure     PT G Codes:        Weston Anna, MPT Pager: [phone]

## 2015-11-14 NOTE — Progress Notes (Signed)
   11/14/15 1108  PT Time Calculation  PT Start Time (ACUTE ONLY) 1025  PT Stop Time (ACUTE ONLY) 1042  PT Time Calculation (min) (ACUTE ONLY) 17 min  PT G-Codes **NOT FOR INPATIENT CLASS**  Functional Assessment Tool Used (clinical judgement)  Functional Limitation Mobility: Walking and moving around  Mobility: Walking and Moving Around Current Status [id number]) CJ  Mobility: Walking and Moving Around Goal Status [id number]) CI  PT General Charges  $$ ACUTE PT VISIT 1 Procedure  PT Evaluation  $PT Eval Moderate Complexity 1 Procedure    Weston Anna, MPT [phone]

## 2015-11-15 DIAGNOSIS — M4850XA Collapsed vertebra, not elsewhere classified, site unspecified, initial encounter for fracture: Secondary | ICD-10-CM

## 2015-11-15 DIAGNOSIS — M4854XS Collapsed vertebra, not elsewhere classified, thoracic region, sequela of fracture: Secondary | ICD-10-CM | POA: Diagnosis not present

## 2015-11-15 DIAGNOSIS — J9601 Acute respiratory failure with hypoxia: Secondary | ICD-10-CM | POA: Diagnosis not present

## 2015-11-15 DIAGNOSIS — I5033 Acute on chronic diastolic (congestive) heart failure: Secondary | ICD-10-CM | POA: Diagnosis not present

## 2015-11-15 LAB — BASIC METABOLIC PANEL
Anion gap: 11 (ref 5–15)
BUN: 57 mg/dL — ABNORMAL HIGH (ref 6–20)
CHLORIDE: 93 mmol/L — AB (ref 101–111)
CO2: 32 mmol/L (ref 22–32)
CREATININE: 2.4 mg/dL — AB (ref 0.61–1.24)
Calcium: 8.4 mg/dL — ABNORMAL LOW (ref 8.9–10.3)
GFR, EST AFRICAN AMERICAN: 26 mL/min — AB (ref >60)
GFR, EST NON AFRICAN AMERICAN: 23 mL/min — AB (ref >60)
Glucose, Bld: 109 mg/dL — ABNORMAL HIGH (ref 65–99)
Potassium: 3.6 mmol/L (ref 3.5–5.1)
SODIUM: 136 mmol/L (ref 135–145)

## 2015-11-15 MED ORDER — SODIUM CHLORIDE 0.9 % IV SOLN
INTRAVENOUS | Status: AC
  Administered 2015-11-15: 15:00:00 via INTRAVENOUS

## 2015-11-15 MED ORDER — BISACODYL 10 MG RE SUPP
10.0000 mg | Freq: Once | RECTAL | Status: AC
  Administered 2015-11-15: 10 mg via RECTAL
  Filled 2015-11-15: qty 1

## 2015-11-15 NOTE — NC FL2 (Signed)
[place] LEVEL OF CARE SCREENING TOOL     IDENTIFICATION  Patient Name: [name] Birthdate: 12/22/1924 Sex: male Admission Date (Current Location): 03/27/2015  [facility] and [place] Number:  Herbalist and Address:  [facility],  [place] [address], [place]      Provider Number: [id number]  Attending Physician Name and Address:  No att. providers found  Relative Name and Phone Number:       Current Level of Care: Hospital Recommended Level of Care: [place] Prior Approval Number:    Date Approved/Denied:   PASRR Number:    Discharge Plan: SNF    Current Diagnoses: Patient Active Problem List   Diagnosis Date Noted  . Diastolic CHF, acute on chronic (HCC) 11/13/2015  . Acute respiratory failure with hypoxia ([place]) 11/13/2015  . Compression fracture of T12 vertebra ([place]) 11/13/2015  . Leukocytosis 11/13/2015  . Hypervolemia   . Vertebral compression fracture ([place])   . Volume overload 11/12/2015  . Loss of weight 10/20/2015  . Lower urinary tract symptoms (LUTS) 10/20/2015  . CVA (cerebral infarction) 08/11/2015  . Subcapital fracture of hip ([place]) 03/27/2015  . Hip fracture ([place]) 03/27/2015  . Esophageal reflux 03/25/2015  . Memory change 12/01/2014  . C. difficile colitis 09/03/2014  . Weakness 08/08/2014  . Acute respiratory disease 08/01/2014  . Atrial fibrillation with RVR ([place]) 08/01/2014  . Diastolic dysfunction [id number]  . Essential hypertension 08/01/2014  . Lactic acidosis 08/01/2014  . Respiratory distress 07/31/2014  . Acute on chronic diastolic heart failure ([place]) 12/20/2013  . Dysphagia 12/17/2013  . CAP (community acquired pneumonia) 12/12/2013  . Abnormality of gait 01/18/2013  . Cough 01/18/2013  . Routine general medical examination at a health care facility 05/31/2012  . Esophagitis 10/26/2011  . Chest wall pain 08/30/2011  . TIA (transient ischemic attack) 03/23/2011  .  B12 deficiency 03/23/2011  . Esophageal dysmotility 10/28/2010  . Other urinary incontinence 10/28/2010  . TOBACCO ABUSE 04/26/2010  . PNEUMONIA, BILATERAL 02/08/2009  . HYPERCHOLESTEROLEMIA 07/14/2008  . ABDOMINAL BRUIT 07/14/2008    Orientation RESPIRATION BLADDER Height & Weight     Self, Time, Situation, Place  O2 Continent Weight: 63.05 kg (139 lb) Height:  5\' 5"  (165.1 cm)  BEHAVIORAL SYMPTOMS/MOOD NEUROLOGICAL BOWEL NUTRITION STATUS  Other (Comment) (no behaviors)   Continent Diet  AMBULATORY STATUS COMMUNICATION OF NEEDS Skin   Limited Assist Verbally Normal                       Personal Care Assistance Level of Assistance  Bathing, Feeding, Dressing Bathing Assistance: Limited assistance Feeding assistance: Independent Dressing Assistance: Limited assistance     Functional Limitations Info  Sight, Hearing, Speech Sight Info: Adequate Hearing Info: Adequate Speech Info: Adequate    SPECIAL CARE FACTORS FREQUENCY  PT (By licensed PT), OT (By licensed OT)     PT Frequency: 5 x wk OT Frequency: 5 x wk            Contractures Contractures Info: Not present    Additional Factors Info  Code Status Code Status Info: Full Code             Current Medications (11/15/2015):  This is the current hospital active medication list No current facility-administered medications for this encounter.   No current outpatient prescriptions on file.   Facility-Administered Medications Ordered in Other Encounters  Medication Dose Route Frequency Provider Last Rate Last Dose  .  0.9 %  sodium chloride infusion  250 mL Intravenous PRN Vianne Bulls, MD      . acetaminophen (TYLENOL) tablet 650 mg  650 mg Oral Q4H PRN Vianne Bulls, MD   650 mg at 11/13/15 1618  . antiseptic oral rinse (CPC / CETYLPYRIDINIUM CHLORIDE 0.05%) solution 7 mL  7 mL Mouth Rinse BID Orson Eva, MD   7 mL at 11/15/15 0952  . darifenacin (ENABLEX) 24 hr tablet 7.5 mg  7.5 mg Oral Daily  Vianne Bulls, MD   7.5 mg at 11/15/15 0951  . docusate sodium (COLACE) capsule 100 mg  100 mg Oral BID Orson Eva, MD   100 mg at 11/15/15 0951  . enoxaparin (LOVENOX) injection 30 mg  30 mg Subcutaneous Q24H Thomes Lolling, RPH   30 mg at 11/15/15 0951  . metoprolol tartrate (LOPRESSOR) tablet 12.5 mg  12.5 mg Oral BID Orson Eva, MD   Stopped at 11/15/15 [phone]  . morphine 2 MG/ML injection 1 mg  1 mg Intravenous Q2H PRN Ilene Qua Opyd, MD      . ondansetron (ZOFRAN) injection 4 mg  4 mg Intravenous Q6H PRN Vianne Bulls, MD      . pantoprazole (PROTONIX) EC tablet 40 mg  40 mg Oral QHS Vianne Bulls, MD   40 mg at 11/14/15 2219  . senna (SENOKOT) tablet 17.2 mg  2 tablet Oral Daily Orson Eva, MD   17.2 mg at 11/15/15 0951  . simethicone (MYLICON) 40 [id number] suspension 40 mg  40 mg Oral QID Vianne Bulls, MD   40 mg at 11/15/15 0951  . simvastatin (ZOCOR) tablet 20 mg  20 mg Oral Daily Vianne Bulls, MD   20 mg at 11/15/15 0952  . sodium chloride flush (NS) 0.9 % injection 3 mL  3 mL Intravenous Q12H Vianne Bulls, MD   3 mL at 11/15/15 1052  . sodium chloride flush (NS) 0.9 % injection 3 mL  3 mL Intravenous PRN Vianne Bulls, MD      . tamsulosin (FLOMAX) capsule 0.4 mg  0.4 mg Oral Daily Vianne Bulls, MD   0.4 mg at 11/15/15 0951  . tobramycin (TOBREX) 0.3 % ophthalmic solution 1 drop  1 drop Both Eyes BID Vianne Bulls, MD   1 drop at 11/15/15 1052  . traMADol (ULTRAM) tablet 50 mg  50 mg Oral Q6H PRN Vianne Bulls, MD   50 mg at 11/14/15 1519     Discharge Medications: Please see discharge summary for a list of discharge medications.  Relevant Imaging Results:  Relevant Lab Results:   Additional Information Social Security Number [id number]  Dasani Thurlow, Randall An, IXL

## 2015-11-15 NOTE — NC FL2 (Signed)
[place] LEVEL OF CARE SCREENING TOOL     IDENTIFICATION  Patient Name: [name] Birthdate: 1924/12/08 Sex: male Admission Date (Current Location): 11/12/2015  [facility] and [place] Number:      Facility and Address:         Provider Number:    Attending Physician Name and Address:  Orson Eva, MD  Relative Name and Phone Number:       Current Level of Care:   Recommended Level of Care:   Prior Approval Number:    Date Approved/Denied:   PASRR Number:    Discharge Plan:      Current Diagnoses: Patient Active Problem List   Diagnosis Date Noted  . Diastolic CHF, acute on chronic (HCC) 11/13/2015  . Acute respiratory failure with hypoxia ([place]) 11/13/2015  . Compression fracture of T12 vertebra ([place]) 11/13/2015  . Leukocytosis 11/13/2015  . Hypervolemia   . Vertebral compression fracture ([place])   . Volume overload 11/12/2015  . Loss of weight 10/20/2015  . Lower urinary tract symptoms (LUTS) 10/20/2015  . CVA (cerebral infarction) 08/11/2015  . Subcapital fracture of hip ([place]) 03/27/2015  . Hip fracture ([place]) 03/27/2015  . Esophageal reflux 03/25/2015  . Memory change 12/01/2014  . C. difficile colitis 09/03/2014  . Weakness 08/08/2014  . Acute respiratory disease 08/01/2014  . Atrial fibrillation with RVR ([place]) 08/01/2014  . Diastolic dysfunction [id number]  . Essential hypertension 08/01/2014  . Lactic acidosis 08/01/2014  . Respiratory distress 07/31/2014  . Acute on chronic diastolic heart failure ([place]) 12/20/2013  . Dysphagia 12/17/2013  . CAP (community acquired pneumonia) 12/12/2013  . Abnormality of gait 01/18/2013  . Cough 01/18/2013  . Routine general medical examination at a health care facility 05/31/2012  . Esophagitis 10/26/2011  . Chest wall pain 08/30/2011  . TIA (transient ischemic attack) 03/23/2011  . B12 deficiency 03/23/2011  . Esophageal dysmotility 10/28/2010  . Other urinary incontinence 10/28/2010  .  TOBACCO ABUSE 04/26/2010  . PNEUMONIA, BILATERAL 02/08/2009  . HYPERCHOLESTEROLEMIA 07/14/2008  . ABDOMINAL BRUIT 07/14/2008    Orientation RESPIRATION BLADDER Height & Weight            Weight: 61.1 kg (134 lb 11.2 oz) Height:  5\' 5"  (165.1 cm)  BEHAVIORAL SYMPTOMS/MOOD NEUROLOGICAL BOWEL NUTRITION STATUS           AMBULATORY STATUS COMMUNICATION OF NEEDS Skin                               Personal Care Assistance Level of Assistance              Functional Limitations Info             SPECIAL CARE FACTORS FREQUENCY                       Contractures      Additional Factors Info                  Current Medications (11/15/2015):  This is the current hospital active medication list Current Facility-Administered Medications  Medication Dose Route Frequency Provider Last Rate Last Dose  . 0.9 %  sodium chloride infusion  250 mL Intravenous PRN Vianne Bulls, MD      . acetaminophen (TYLENOL) tablet 650 mg  650 mg Oral Q4H PRN Vianne Bulls, MD   650 mg at 11/13/15 1618  . antiseptic oral rinse (CPC /  CETYLPYRIDINIUM CHLORIDE 0.05%) solution 7 mL  7 mL Mouth Rinse BID Orson Eva, MD   7 mL at 11/15/15 0952  . darifenacin (ENABLEX) 24 hr tablet 7.5 mg  7.5 mg Oral Daily Vianne Bulls, MD   7.5 mg at 11/15/15 0951  . docusate sodium (COLACE) capsule 100 mg  100 mg Oral BID Orson Eva, MD   100 mg at 11/15/15 0951  . enoxaparin (LOVENOX) injection 30 mg  30 mg Subcutaneous Q24H Thomes Lolling, RPH   30 mg at 11/15/15 0951  . metoprolol tartrate (LOPRESSOR) tablet 12.5 mg  12.5 mg Oral BID Orson Eva, MD   Stopped at 11/15/15 [phone]  . morphine 2 MG/ML injection 1 mg  1 mg Intravenous Q2H PRN Ilene Qua Opyd, MD      . ondansetron (ZOFRAN) injection 4 mg  4 mg Intravenous Q6H PRN Vianne Bulls, MD      . pantoprazole (PROTONIX) EC tablet 40 mg  40 mg Oral QHS Vianne Bulls, MD   40 mg at 11/14/15 2219  . senna (SENOKOT) tablet 17.2 mg  2 tablet Oral  Daily Orson Eva, MD   17.2 mg at 11/15/15 0951  . simethicone (MYLICON) [id number] suspension 40 mg  40 mg Oral QID Vianne Bulls, MD   40 mg at 11/15/15 0951  . simvastatin (ZOCOR) tablet 20 mg  20 mg Oral Daily Vianne Bulls, MD   20 mg at 11/15/15 0952  . sodium chloride flush (NS) 0.9 % injection 3 mL  3 mL Intravenous Q12H Vianne Bulls, MD   3 mL at 11/15/15 1052  . sodium chloride flush (NS) 0.9 % injection 3 mL  3 mL Intravenous PRN Vianne Bulls, MD      . tamsulosin (FLOMAX) capsule 0.4 mg  0.4 mg Oral Daily Vianne Bulls, MD   0.4 mg at 11/15/15 0951  . tobramycin (TOBREX) 0.3 % ophthalmic solution 1 drop  1 drop Both Eyes BID Vianne Bulls, MD   1 drop at 11/15/15 1052  . traMADol (ULTRAM) tablet 50 mg  50 mg Oral Q6H PRN Vianne Bulls, MD   50 mg at 11/14/15 1519     Discharge Medications: Please see discharge summary for a list of discharge medications.  Relevant Imaging Results:  Relevant Lab Results:   Additional Information    Magdelena Kinsella, Randall An, LCSW

## 2015-11-15 NOTE — Progress Notes (Signed)
PROGRESS NOTE  [name] [id number] DOB: [birth year] DOA: 11/12/2015 PCP: [name], MD Brief History 82 y.o. male with PMH of hypertension, hyperlipidemia, and chronic diastolic CHF who presents to the ED with 2-3 days of progressive dyspnea and bilateral lower extremity edema, as well as severe mid back pain following a fall 2 days ago. Patient lives at home with his wife and is typically able to manage his ADLs, but has noted increasing difficulty over the past 2 days due to dyspnea with minimal exertion. Over the same interval, he has noted edema of the bilateral lower extremities. He denies any chest pain or palpitations, and denies cough. He denies fevers, chills, sick contacts, or recent long distance travel.  In ED, patient was found to be afebrile, requiring 2 L supplemental oxygen to maintain saturation in the 90s, but with vital signs otherwise stable. Chest x-ray was negative for acute cardiopulmonary disease, EKG demonstrated sinus rhythm with APCs, and initial blood work is notable for leukocytosis of 11,900. CT of the abdomen/pelvis was obtained and reveals an acute compression fracture of the T12 vertebrae with moderate height loss and mild retropulsion. Patient was given a dose of ibuprofen for his back pain and an IV push of 40 mg Lasix for apparent volume overload.   Assessment/Plan: Acute on chronic diastolic CHF  - Presents with dyspnea and marked peripheral edema  - Suspect right-sided CHF  - TTE (08/12/15) with EF 55-60%, moderate LVH, grade 1 diastolic dysfunction  - D/C lasix IV - Daily weights - SLIV, fluid-restrict diet  - NEG 3.6 L for the admission -NEG 3 pounds  Acute hypoxic respiratory failure  - Suspected secondary to #1 above in setting of underlying COPD -Patient has greater than 50 pack year history - Quickly desaturates to 80s with minimal exertion on rm air  - Presently stable on 2 L at rest -3/19/17repeat CXR as pt  remains hypoxic even after diuresis  AKI -Secondary to aggressive diuresis -hold lisinopril -3/19-give NS 300 cc with soft blood pressure -hold lasix  Acute compression fracture, T-12 - Suffered ground-level mechanical fall 2 days ago - Pain is tolerable at rest, much worse with any movement  - Pain-control with home-dose tramadol prn mild-mod pain, morphine prn severe pain   Leukocytosis - WBC 11,900 on admission  - No evidence of bacterial infection identified  - Possibly a stress demargination reaction to fracture  - Monitor off abx, culture if febrile  -Urinalysis negative for pyuria -Chest x-ray negative for infiltrates -Improved without antibiotics  Hypertension -hold amlodipine -hold lisinopril in setting of increasing serum creatinine -Blood pressure has become soft after diuresis -Metoprolol tartrate 12.5 mg twice a day  SVT -11/13/2015 pt had brief episode of SVT -Metoprolol 12.5 mg twice a day  Constipation -start cathartics -3/19--add bisacodyl  Family Communication: Wife updated at beside 11/15/15 Disposition Plan: SNF 11/16/2015 if stable    Procedures/Studies: Dg Chest 2 View  11/12/2015  CLINICAL DATA:  Shortness of breath, productive cough. EXAM: CHEST  2 VIEW COMPARISON:  August 14, 2015 FINDINGS: The heart size and mediastinal contours are stable. There is no focal infiltrate, pulmonary edema, or pleural effusion. With minimal atelectasis of both lung bases are noted. The visualized skeletal structures are unremarkable. IMPRESSION: No active cardiopulmonary disease. Electronically Signed   By: Abelardo Diesel M.D.   On: 11/12/2015 20:27   Ct Abdomen Pelvis W Contrast  11/12/2015  CLINICAL DATA:  Abdominal distention.  EXAM: CT ABDOMEN AND PELVIS WITH CONTRAST TECHNIQUE: Multidetector CT imaging of the abdomen and pelvis was performed using the standard protocol following bolus administration of intravenous contrast. CONTRAST:  141mL OMNIPAQUE  IOHEXOL 300 MG/ML  SOLN COMPARISON:  None. FINDINGS: Lower chest and abdominal wall: Bibasilar emphysema. Extensive atherosclerotic calcification. Fatty left inguinal hernia. Hepatobiliary: Few and small incidental cysts in the liver.Cholelithiasis without signs of obstruction or inflammation. Pancreas: Unremarkable. Spleen: Unremarkable. Adrenals/Urinary Tract: Negative adrenals. Renal atrophy. Bilateral renal cysts; the largest is exophytic from the right lower pole measuring 53 mm and with benign peripheral calcification. No hydronephrosis or ureteral calculus. Unremarkable bladder. Reproductive:Symmetric prostate enlargement. Stomach/Bowel: Large volume of gas distending the entire colon. Stool retention is mild. No volvulus or impaction. No definitive over distension as usually seen with colonic ileus. No appendicitis. Vascular/Lymphatic: Advanced and diffuse atherosclerotic disease with 29 mm aortic terminus fusiform aneurysm. No acute vascular finding. No mass or adenopathy. Peritoneal: No ascites or pneumoperitoneum. Musculoskeletal: Remote left femoral neck fracture status post ORIF. A portion of the fracture is still visible but there is bridging bone. T12 compression fracture with acute appearance. Height loss is approximately 50%. Mild posterior retropulsion without canal stenosis. No subluxation or posterior element fracturing. Lumbar facet arthropathy with L4-5 and L5-S1 anterolisthesis. IMPRESSION: 1. Acute T12 compression fracture with moderate height loss and mild retropulsion. 2. Abdominal distention correlates with diffuse gaseous distention of colon. No mechanical obstruction. 3. Chronic findings are described above. Electronically Signed   By: Monte Fantasia M.D.   On: 11/12/2015 22:28         Subjective: Patient denies fevers, chills, headache, chest pain, dyspnea, nausea, vomiting, diarrhea, abdominal pain, dysuria, hematuria. He denies any headache, neck pain, visual disturbance. He  states that his breathing is better than on the first day of hospitalization, but he feels weaker.   Objective: Filed Vitals:   11/14/15 1620 11/14/15 2057 11/15/15 0603 11/15/15 0952  BP: 105/45 95/50 103/51 90/47  Pulse: 60 59 59 58  Temp: 98.5 F (36.9 C) 97.4 F (36.3 C) 98.9 F (37.2 C)   TempSrc: Oral Oral Oral   Resp: 20 20 18    Height:      Weight:   61.1 kg (134 lb 11.2 oz)   SpO2: 95% 96% 95%     Intake/Output Summary (Last 24 hours) at 11/15/15 1434 Last data filed at 11/15/15 1029  Gross per 24 hour  Intake    510 ml  Output    650 ml  Net   -140 ml   Weight change: 1.5 kg (3 lb 4.9 oz) Exam:   General:  Pt is alert, follows commands appropriately, not in acute distress  HEENT: No icterus, No thrush, No neck mass, [place]/AT  Cardiovascular: RRR, S1/S2, no rubs, no gallops  Respiratory: Bibasilar crackles. No wheezing. Good air movement  Abdomen: Soft/+BS, non tender, non distended, no guarding; no hepatosplenomegaly   Extremities: No edema, No lymphangitis, No petechiae, No rashes, no synovitis; no cyanosis or clubbing   Data Reviewed: Basic Metabolic Panel:  Recent Labs Lab 11/12/15 1924 11/14/15 0527 11/15/15 0528  NA 136 137 136  K 4.2 3.6 3.6  CL 99* 93* 93*  CO2 27 32 32  GLUCOSE 123* 97 109*  BUN 32* 45* 57*  CREATININE 1.12 2.10* 2.40*  CALCIUM 9.2 8.4* 8.4*   Liver Function Tests:  Recent Labs Lab 11/12/15 1924  AST 38  ALT 39  ALKPHOS 107  BILITOT 0.9  PROT 8.0  ALBUMIN  4.1   No results for input(s): LIPASE, AMYLASE in the last 168 hours. No results for input(s): AMMONIA in the last 168 hours. CBC:  Recent Labs Lab 11/12/15 1924 11/14/15 0527  WBC 11.9* 9.2  HGB 13.1 11.2*  HCT 39.1 33.6*  MCV 80.8 81.0  PLT 280 263   Cardiac Enzymes: No results for input(s): CKTOTAL, CKMB, CKMBINDEX, TROPONINI in the last 168 hours. BNP: Invalid input(s): POCBNP CBG: No results for input(s): GLUCAP in the last 168  hours.  Recent Results (from the past 240 hour(s))  Urine culture     Status: None   Collection Time: 11/13/15  6:35 AM  Result Value Ref Range Status   Specimen Description URINE, CLEAN CATCH  Final   Special Requests NONE  Final   Culture   Final    NO GROWTH 1 DAY Performed at [facility]    Report Status 11/14/2015 FINAL  Final     Scheduled Meds: . antiseptic oral rinse  7 mL Mouth Rinse BID  . darifenacin  7.5 mg Oral Daily  . docusate sodium  100 mg Oral BID  . enoxaparin (LOVENOX) injection  30 mg Subcutaneous Q24H  . metoprolol tartrate  12.5 mg Oral BID  . pantoprazole  40 mg Oral QHS  . senna  2 tablet Oral Daily  . simethicone  40 mg Oral QID  . simvastatin  20 mg Oral Daily  . sodium chloride flush  3 mL Intravenous Q12H  . tamsulosin  0.4 mg Oral Daily  . tobramycin  1 drop Both Eyes BID   Continuous Infusions:    Candi Profit, DO  [facility] Pager [phone]  If 7PM-7AM, please contact night-coverage [email or web address] Password [facility] 11/15/2015, 2:34 PM

## 2015-11-15 NOTE — Plan of Care (Signed)
Problem: Safety: Goal: Ability to remain free from injury will improve Outcome: Progressing High risk for fall.  Fall risk signage completed, bed alarm on.   Continue with plan of care.    Problem: Pain Managment: Goal: General experience of comfort will improve Outcome: Progressing Denies pain.

## 2015-11-15 NOTE — Progress Notes (Signed)
CSW consulted for SNF placement. Pt sleeping at this time. Message left for spouse to contact CSW. PT has recommended SNF placement. CSW will assist with d/c planning.  Werner Lean LCSW [phone]

## 2015-11-16 DIAGNOSIS — J9601 Acute respiratory failure with hypoxia: Secondary | ICD-10-CM | POA: Diagnosis not present

## 2015-11-16 DIAGNOSIS — I5033 Acute on chronic diastolic (congestive) heart failure: Secondary | ICD-10-CM | POA: Diagnosis not present

## 2015-11-16 DIAGNOSIS — M4850XG Collapsed vertebra, not elsewhere classified, site unspecified, subsequent encounter for fracture with delayed healing: Secondary | ICD-10-CM

## 2015-11-16 LAB — BASIC METABOLIC PANEL
Anion gap: 10 (ref 5–15)
BUN: 57 mg/dL — AB (ref 4–21)
BUN: 57 mg/dL — AB (ref 6–20)
CO2: 32 mmol/L (ref 22–32)
CREATININE: 1.8 mg/dL — AB (ref 0.6–1.3)
CREATININE: 1.82 mg/dL — AB (ref 0.61–1.24)
Calcium: 8.4 mg/dL — ABNORMAL LOW (ref 8.9–10.3)
Chloride: 96 mmol/L — ABNORMAL LOW (ref 101–111)
GFR calc Af Amer: 37 mL/min — ABNORMAL LOW (ref >60)
GFR, EST NON AFRICAN AMERICAN: 32 mL/min — AB (ref >60)
Glucose, Bld: 99 mg/dL (ref 65–99)
Glucose: 99 mg/dL
Potassium: 3.9 mmol/L (ref 3.5–5.1)
SODIUM: 138 mmol/L (ref 137–147)
SODIUM: 138 mmol/L (ref 135–145)

## 2015-11-16 LAB — MAGNESIUM: MAGNESIUM: 1.9 mg/dL (ref 1.7–2.4)

## 2015-11-16 MED ORDER — FUROSEMIDE 20 MG PO TABS: 20.0000 mg | ORAL_TABLET | Freq: Every day | ORAL | Status: AC

## 2015-11-16 MED ORDER — METOPROLOL TARTRATE 25 MG PO TABS: 12.5000 mg | ORAL_TABLET | Freq: Two times a day (BID) | ORAL | Status: DC

## 2015-11-16 NOTE — Clinical Social Work Placement (Signed)
Patient is set to discharge to [facility] SNF today. Patient & wife at bedside aware aware. Discharge packet given to RN, Gregary Signs. [facility] called for transport to pickup at 3:15pm.     Raynaldo Opitz, [place] Social Worker cell #: [phone]    CLINICAL SOCIAL WORK PLACEMENT  NOTE  Date:  11/16/2015  Patient Details  Name: [name] MRN: [id number] Date of Birth: 1925-07-23  Clinical Social Work is seeking post-discharge placement for this patient at the [place] level of care (*CSW will initial, date and re-position this form in  chart as items are completed):  Yes   Patient/family provided with [place] Work Department's list of facilities offering this level of care within the geographic area requested by the patient (or if unable, by the patient's family).  Yes   Patient/family informed of their freedom to choose among providers that offer the needed level of care, that participate in Medicare, Medicaid or managed care program needed by the patient, have an available bed and are willing to accept the patient.  Yes   Patient/family informed of [facility]'s ownership interest in [facility] and [facility], as well as of the fact that they are under no obligation to receive care at these facilities.  PASRR submitted to EDS on 11/16/15     PASRR number received on 11/16/15     Existing PASRR number confirmed on       FL2 transmitted to all facilities in geographic area requested by pt/family on 11/16/15     FL2 transmitted to all facilities within larger geographic area on       Patient informed that his/her managed care company has contracts with or will negotiate with certain facilities, including the following:        Yes   Patient/family informed of bed offers received.  Patient chooses bed at [facility]     Physician recommends and patient chooses bed at      Patient to be  transferred to [facility] on 11/16/15.  Patient to be transferred to facility by [facility]     Patient family notified on 11/16/15 of transfer.  Name of family member notified:  patient's wife at bedside     PHYSICIAN       Additional Comment:    _______________________________________________ Standley Brooking, LCSW 11/16/2015, 2:34 PM

## 2015-11-16 NOTE — Care Management Note (Signed)
Case Management Note  Patient Details  Name: [name] MRN: [id number] Date of Birth: [birth year]  Subjective/Objective:   82 y/o m admitted w/Acute resp failure. From home.PT-[facility] SNF. CSW following.                 Action/Plan:d/c plan SNF.   Expected Discharge Date:                  Expected Discharge Plan:  Skilled Nursing Facility  In-House Referral:  Clinical Social Work  Discharge planning Services  CM Consult  Post Acute Care Choice:    Choice offered to:     DME Arranged:    DME Agency:     HH Arranged:    [place] Agency:     Status of Service:  In process, will continue to follow  Medicare Important Message Given:    Date Medicare IM Given:    Medicare IM give by:    Date Additional Medicare IM Given:    Additional Medicare Important Message give by:     If discussed at [place] of Stay Meetings, dates discussed:    Additional Comments:  Dessa Phi, RN 11/16/2015, 12:41 PM

## 2015-11-16 NOTE — Discharge Summary (Signed)
Physician Discharge Summary  [name] [id number] DOB: March 19, 1925 DOA: 11/12/2015  PCP: [name], MD  Admit date: 11/12/2015 Discharge date: 11/16/2015  Recommendations for Outpatient Follow-up:  1. Pt will need to follow up with PCP in 2 weeks post discharge 2. Please obtain BMP on 11/23/15 3. Restart furosemide on 11/19/2015 4. Maintain pt on 2 L [place] and wean off for oxygenation >92% Discharge Diagnoses:  Acute on chronic diastolic CHF  - Presents with dyspnea and marked peripheral edema  - Suspect right-sided CHF  - TTE (08/12/15) with EF 55-60%, moderate LVH, grade 1 diastolic dysfunction  - D/C lasix IV--due to increased serum creatinine - Daily weights--discharge weight 129 pounds - SLIV, fluid-restrict diet  - NEG 3.6 L for the admission -NEG 7 pounds -Restart furosemide 20 mg po daily on 11/19/2015 -Please recheck BMP on 11/23/2015 to determine need for adjustment of furosemide  Acute hypoxic respiratory failure  - Suspected secondary to #1 above in setting of underlying COPD -Patient has greater than 50 pack year history - Quickly desaturates to 80s with minimal exertion on rm air  - Presently stable on 2 L at rest -3/19/17repeat CXR as pt remains hypoxic even after diuresis  AKI -Secondary to aggressive diuresis -hold lisinopril -3/19-give NS 300 cc with soft blood pressure -hold lasix x 2 days with improvement of serum creatinine -Serum creatinine 1.82 on the day of discharge --Restart furosemide 20 mg po daily on 11/19/2015 -Please recheck BMP on 11/23/2015 to determine need for adjustment of furosemide  Acute compression fracture, T-12 - Suffered ground-level mechanical fall 2 days ago - Pain is tolerable at rest, much worse with any movement  - Pain-control with home-dose tramadol prn mild-mod pain, morphine prn severe pain  -PT evaluation-->SNF  Leukocytosis - WBC 11,900 on admission  - No evidence of bacterial infection  identified  - Possibly a stress demargination reaction to fracture  - Monitor off abx, culture if febrile  -Urinalysis negative for pyuria -Chest x-ray negative for infiltrates -Improved without antibiotics  Hypertension -d/c amlodipine -hold lisinopril in setting of increasing serum creatinine -Blood pressure has become soft after diuresis -Metoprolol tartrate 12.5 mg twice a day  SVT -11/13/2015 pt had brief episode of SVT -Metoprolol 12.5 mg twice a day-->no further occurence  Constipation -start cathartics -3/19--add bisacodyl-->+BM  Discharge Condition: stable  Disposition: SNF-[facility]  Diet:heart healthy Wt Readings from Last 3 Encounters:  11/16/15 58.8 kg (129 lb 10.1 oz)  10/20/15 54.658 kg (120 lb 8 oz)  09/18/15 61.236 kg (135 lb)    History of present illness:  82 y.o. male with PMH of hypertension, hyperlipidemia, and chronic diastolic CHF who presents to the ED with 2-3 days of progressive dyspnea and bilateral lower extremity edema, as well as severe mid back pain following a fall 2 days ago. Patient lives at home with his wife and is typically able to manage his ADLs, but has noted increasing difficulty over the past 2 days due to dyspnea with minimal exertion. Over the same interval, he has noted edema of the bilateral lower extremities. He denies any chest pain or palpitations, and denies cough. He denies fevers, chills, sick contacts, or recent long distance travel.  In ED, patient was found to be afebrile, requiring 2 L supplemental oxygen to maintain saturation in the 90s, but with vital signs otherwise stable. Chest x-ray was negative for acute cardiopulmonary disease, EKG demonstrated sinus rhythm with APCs, and initial blood work is notable for leukocytosis of 11,900. CT  of the abdomen/pelvis was obtained and reveals an acute compression fracture of the T12 vertebrae with moderate height loss and mild retropulsion. Patient was given a dose of  ibuprofen for his back pain and an IV push of 40 mg Lasix for apparent volume overload. The patient received 4 doses of intravenous furosemide with significant increase in his serum creatinine. His furosemide was held for 2 days with improvement. The patient remained euvolemic. He was -3.6 L for the admission. His furosemide should be resumed on 11/19/2015, 20 mg po daily and recheck BMP on 11/23/2015 to determine any dose adjustments that may be needed to the patient's furosemide.  Discharge Exam: Filed Vitals:   11/16/15 0519 11/16/15 0934  BP: 119/57 130/56  Pulse: 66 67  Temp: 98.1 F (36.7 C)   Resp: 16    Filed Vitals:   11/15/15 2123 11/16/15 0452 11/16/15 0519 11/16/15 0934  BP: 99/44  119/57 130/56  Pulse: 51  66 67  Temp: 98 F (36.7 C)  98.1 F (36.7 C)   TempSrc: Oral Oral Oral   Resp: 16  16   Height:      Weight:   58.8 kg (129 lb 10.1 oz)   SpO2: 99%  97%    General: A&O x 3, NAD, pleasant, cooperative Cardiovascular: RRR, no rub, no gallop, no S3 Respiratory: CTAB, no wheeze, no rhonchi Abdomen:soft, nontender, nondistended, positive bowel sounds Extremities: No edema, No lymphangitis, no petechiae  Discharge Instructions      Discharge Instructions    Diet - low sodium heart healthy    Complete by:  As directed      Increase activity slowly    Complete by:  As directed             Medication List    STOP taking these medications        amLODipine 10 MG tablet  Commonly known as:  NORVASC     aspirin EC 325 MG tablet     pantoprazole 40 MG tablet  Commonly known as:  PROTONIX      TAKE these medications        albuterol 108 (90 Base) MCG/ACT inhaler  Commonly known as:  PROVENTIL HFA;VENTOLIN HFA  Inhale 2 puffs into the lungs every 6 (six) hours as needed for wheezing or shortness of breath.     docusate sodium 100 MG capsule  Commonly known as:  COLACE  Take 1 capsule (100 mg total) by mouth 2 (two) times daily.     furosemide 20 MG  tablet  Commonly known as:  LASIX  Take 1 tablet (20 mg total) by mouth daily.  Start taking on:  11/19/2015     metoprolol tartrate 25 MG tablet  Commonly known as:  LOPRESSOR  Take 0.5 tablets (12.5 mg total) by mouth 2 (two) times daily.     neomycin-polymyxin b-dexamethasone 3.5-10000-0.1 Oint  Commonly known as:  MAXITROL  Place 1 application into both eyes 2 (two) times daily.     PRESERVISION AREDS 2 Caps  Take 1 tablet by mouth 2 (two) times daily. Take as directed.     simvastatin 20 MG tablet  Commonly known as:  ZOCOR  Take 1 tablet (20 mg total) by mouth daily.     solifenacin 5 MG tablet  Commonly known as:  VESICARE  Take 1 tablet (5 mg total) by mouth daily.     tamsulosin 0.4 MG Caps capsule  Commonly known as:  FLOMAX  Take 1  capsule (0.4 mg total) by mouth daily.     tobramycin 0.3 % ophthalmic solution  Commonly known as:  TOBREX  Place 1 drop into both eyes 2 (two) times daily.     traMADol 50 MG tablet  Commonly known as:  ULTRAM  Take 1 tablet (50 mg total) by mouth every 8 (eight) hours as needed.         The results of significant diagnostics from this hospitalization (including imaging, microbiology, ancillary and laboratory) are listed below for reference.    Significant Diagnostic Studies: Dg Chest 2 View  11/12/2015  CLINICAL DATA:  Shortness of breath, productive cough. EXAM: CHEST  2 VIEW COMPARISON:  August 14, 2015 FINDINGS: The heart size and mediastinal contours are stable. There is no focal infiltrate, pulmonary edema, or pleural effusion. With minimal atelectasis of both lung bases are noted. The visualized skeletal structures are unremarkable. IMPRESSION: No active cardiopulmonary disease. Electronically Signed   By: Abelardo Diesel M.D.   On: 11/12/2015 20:27   Ct Abdomen Pelvis W Contrast  11/12/2015  CLINICAL DATA:  Abdominal distention. EXAM: CT ABDOMEN AND PELVIS WITH CONTRAST TECHNIQUE: Multidetector CT imaging of the abdomen  and pelvis was performed using the standard protocol following bolus administration of intravenous contrast. CONTRAST:  166mL OMNIPAQUE IOHEXOL 300 MG/ML  SOLN COMPARISON:  None. FINDINGS: Lower chest and abdominal wall: Bibasilar emphysema. Extensive atherosclerotic calcification. Fatty left inguinal hernia. Hepatobiliary: Few and small incidental cysts in the liver.Cholelithiasis without signs of obstruction or inflammation. Pancreas: Unremarkable. Spleen: Unremarkable. Adrenals/Urinary Tract: Negative adrenals. Renal atrophy. Bilateral renal cysts; the largest is exophytic from the right lower pole measuring 53 mm and with benign peripheral calcification. No hydronephrosis or ureteral calculus. Unremarkable bladder. Reproductive:Symmetric prostate enlargement. Stomach/Bowel: Large volume of gas distending the entire colon. Stool retention is mild. No volvulus or impaction. No definitive over distension as usually seen with colonic ileus. No appendicitis. Vascular/Lymphatic: Advanced and diffuse atherosclerotic disease with 29 mm aortic terminus fusiform aneurysm. No acute vascular finding. No mass or adenopathy. Peritoneal: No ascites or pneumoperitoneum. Musculoskeletal: Remote left femoral neck fracture status post ORIF. A portion of the fracture is still visible but there is bridging bone. T12 compression fracture with acute appearance. Height loss is approximately 50%. Mild posterior retropulsion without canal stenosis. No subluxation or posterior element fracturing. Lumbar facet arthropathy with L4-5 and L5-S1 anterolisthesis. IMPRESSION: 1. Acute T12 compression fracture with moderate height loss and mild retropulsion. 2. Abdominal distention correlates with diffuse gaseous distention of colon. No mechanical obstruction. 3. Chronic findings are described above. Electronically Signed   By: Monte Fantasia M.D.   On: 11/12/2015 22:28     Microbiology: Recent Results (from the past 240 hour(s))  Urine  culture     Status: None   Collection Time: 11/13/15  6:35 AM  Result Value Ref Range Status   Specimen Description URINE, CLEAN CATCH  Final   Special Requests NONE  Final   Culture   Final    NO GROWTH 1 DAY Performed at [facility]    Report Status 11/14/2015 FINAL  Final     Labs: Basic Metabolic Panel:  Recent Labs Lab 11/12/15 1924 11/14/15 0527 11/15/15 0528 11/16/15 0457  NA 136 137 136 138  K 4.2 3.6 3.6 3.9  CL 99* 93* 93* 96*  CO2 27 32 32 32  GLUCOSE 123* 97 109* 99  BUN 32* 45* 57* 57*  CREATININE 1.12 2.10* 2.40* 1.82*  CALCIUM 9.2 8.4* 8.4* 8.4*  MG  --   --   --  1.9   Liver Function Tests:  Recent Labs Lab 11/12/15 1924  AST 38  ALT 39  ALKPHOS 107  BILITOT 0.9  PROT 8.0  ALBUMIN 4.1   No results for input(s): LIPASE, AMYLASE in the last 168 hours. No results for input(s): AMMONIA in the last 168 hours. CBC:  Recent Labs Lab 11/12/15 1924 11/14/15 0527  WBC 11.9* 9.2  HGB 13.1 11.2*  HCT 39.1 33.6*  MCV 80.8 81.0  PLT 280 263   Cardiac Enzymes: No results for input(s): CKTOTAL, CKMB, CKMBINDEX, TROPONINI in the last 168 hours. BNP: Invalid input(s): POCBNP CBG: No results for input(s): GLUCAP in the last 168 hours.  Time coordinating discharge:  Greater than 30 minutes  Signed:  Petina Muraski, DO [facility] Pager: [phone] 11/16/2015, 2:04 PM

## 2015-11-16 NOTE — Progress Notes (Signed)
Patient had a 5 beat run of V Tach this morning. Patient is asymptomatic/ no chest pain or shortness of breath. Blood pressure 119/57, pulse 66, O2 sats 97 on 2 lts, respirations 16. On call MD notified and magnesium lab ordered. Will continue to monitor.

## 2015-11-16 NOTE — Care Management Obs Status (Signed)
[place] NOTIFICATION   Patient Details  Name: [name] MRN: [id number] Date of Birth: 03-28-1925   Medicare Observation Status Notification Given:  Yes    MahabirJuliann Pulse, RN 11/16/2015, 12:40 PM

## 2015-11-16 NOTE — Telephone Encounter (Signed)
Pt has been called and I am working on referrals.  [name]

## 2015-11-18 ENCOUNTER — Encounter: Payer: Self-pay | Admitting: Internal Medicine

## 2015-11-18 ENCOUNTER — Non-Acute Institutional Stay (SKILLED_NURSING_FACILITY): Payer: Medicare Other | Admitting: Internal Medicine

## 2015-11-18 ENCOUNTER — Other Ambulatory Visit: Payer: Self-pay

## 2015-11-18 DIAGNOSIS — N400 Enlarged prostate without lower urinary tract symptoms: Secondary | ICD-10-CM

## 2015-11-18 DIAGNOSIS — E46 Unspecified protein-calorie malnutrition: Secondary | ICD-10-CM | POA: Diagnosis not present

## 2015-11-18 DIAGNOSIS — J9601 Acute respiratory failure with hypoxia: Secondary | ICD-10-CM

## 2015-11-18 DIAGNOSIS — K5900 Constipation, unspecified: Secondary | ICD-10-CM | POA: Diagnosis not present

## 2015-11-18 DIAGNOSIS — E785 Hyperlipidemia, unspecified: Secondary | ICD-10-CM | POA: Diagnosis not present

## 2015-11-18 DIAGNOSIS — R05 Cough: Secondary | ICD-10-CM | POA: Diagnosis not present

## 2015-11-18 DIAGNOSIS — J449 Chronic obstructive pulmonary disease, unspecified: Secondary | ICD-10-CM | POA: Diagnosis not present

## 2015-11-18 DIAGNOSIS — G459 Transient cerebral ischemic attack, unspecified: Secondary | ICD-10-CM | POA: Diagnosis not present

## 2015-11-18 DIAGNOSIS — I4891 Unspecified atrial fibrillation: Secondary | ICD-10-CM

## 2015-11-18 DIAGNOSIS — R5381 Other malaise: Secondary | ICD-10-CM

## 2015-11-18 DIAGNOSIS — I5032 Chronic diastolic (congestive) heart failure: Secondary | ICD-10-CM

## 2015-11-18 DIAGNOSIS — M4850XG Collapsed vertebra, not elsewhere classified, site unspecified, subsequent encounter for fracture with delayed healing: Secondary | ICD-10-CM

## 2015-11-18 DIAGNOSIS — N179 Acute kidney failure, unspecified: Secondary | ICD-10-CM

## 2015-11-18 DIAGNOSIS — IMO0001 Reserved for inherently not codable concepts without codable children: Secondary | ICD-10-CM

## 2015-11-18 DIAGNOSIS — R059 Cough, unspecified: Secondary | ICD-10-CM

## 2015-11-18 MED ORDER — TRAMADOL HCL 50 MG PO TABS: 50.0000 mg | ORAL_TABLET | Freq: Three times a day (TID) | ORAL | Status: DC | PRN

## 2015-11-18 NOTE — Telephone Encounter (Signed)
[facility]  [address] [place] [address]  Phone: [phone]  Fax: [phone]  

## 2015-11-18 NOTE — Addendum Note (Signed)
Addended by: Denyse Amass on: 11/18/2015 08:57 AM   Modules accepted: Orders

## 2015-11-18 NOTE — Progress Notes (Signed)
LOCATION: [place]   PCP: [name], MD   Code Status: Full Code  Goals of care: Advanced Directive information Advanced Directives 11/12/2015  Does patient have an advance directive? No  Would patient like information on creating an advanced directive? No - patient declined information       Extended Emergency Contact Information Primary Emergency Contact: Burlingame,Francis Address: [address]          [place], [place] [address] [place] Phone: [phone] Mobile Phone: [phone] Relation: Spouse Secondary Emergency Contact: Bole,[name] Address: [address]          [place], [place] [address] [place] Litter of [place] Work Phone: [phone] Mobile Phone: [phone] Relation: Son   No Known Allergies  Chief Complaint  Patient presents with  . Readmit To SNF    Readmission     HPI:  Patient is a 82 y.o. male seen today for short term rehabilitation post hospital admission from 11/12/15-11/16/15 with acute hypoxic respiratory failure in setting of copd and chf exacerbation. He required diuresis and then had AKI and his diuretics were held. He had T12 compression frcature. He also had SVT runs and was started on metoprolol. He has PMH of chronic diastolic heart failure, afib, HTN, HLD, stroke among others. He is seen in his room today with his wife and sister in law present.   Review of Systems:  Constitutional: Negative for fever, chills, diaphoresis. Energy is slowly coming back but still feels tired and weak.  HENT: Negative for headache, hearing loss, sore throat, difficulty swallowing. Positive for congestion in his chest and nose. Also positive for nasal discharge.   Eyes: has poor vision.  Respiratory: Negative for shortness of breath and wheezing. Positive for cough Cardiovascular: Negative for chest pain, palpitations, leg swelling.  Gastrointestinal: Negative for heartburn, nausea, vomiting, abdominal pain. Last bowel  movement was 2 days ago after getting a suppository.  Genitourinary: Negative for dysuria and flank pain. was on a condom catheter which fell off today. Family would like another one to be placed Musculoskeletal: Negative for fall in the facility. Positive for back pain.  Skin: Negative for itching, rash.  Neurological: Negative for dizziness. Psychiatric/Behavioral: Negative for depression.   Past Medical History  Diagnosis Date  . Hypertension   . Hyperlipidemia   . Tobacco abuse   . Lung nodule     Left lung, seen 2012, no change in 2012, no follow up needed   . PNA (pneumonia) 2010    Bilateral Pneumonia, COPD 43mm LLL Nodule   . History of ETT 09/1991     POS   . History of MRI 04-22-06    L/S- right hnp  l5/si   . B12 deficiency   . TIA (transient ischemic attack)   . GERD (gastroesophageal reflux disease)   . Esophageal stricture   . Hemorrhoids   . Colon polyps     hyperplastic  . Pneumonia   . Macular degeneration   . Stroke [facility])    Past Surgical History  Procedure Laterality Date  . Cystectomy  1995    Lumbar area   . Hip pinning,cannulated Left 03/28/2015    Procedure: CANNULATED HIP PINNING;  Surgeon: [name], MD;  Location: [facility];  Service: Orthopedics;  Laterality: Left;   Social History:   reports that he quit smoking about 30 years ago. His smoking use included Cigarettes. He has quit using smokeless tobacco. His smokeless tobacco use included Chew. He  reports that he does not drink alcohol or use illicit drugs.  Family History  Problem Relation Age of Onset  . Hypertension Mother   . Heart attack Father   . Colon cancer Neg Hx   . Esophageal cancer Neg Hx     Medications:   Medication List       This list is accurate as of: 11/18/15 12:44 PM.  Always use your most recent med list.               albuterol 108 (90 Base) MCG/ACT inhaler  Commonly known as:  PROVENTIL HFA;VENTOLIN HFA  Inhale 2 puffs into the lungs every 6 (six) hours as  needed for wheezing or shortness of breath.     docusate sodium 100 MG capsule  Commonly known as:  COLACE  Take 100 mg by mouth 2 (two) times daily.     furosemide 20 MG tablet  Commonly known as:  LASIX  Take 1 tablet (20 mg total) by mouth daily.  Start taking on:  11/19/2015     metoprolol tartrate 25 MG tablet  Commonly known as:  LOPRESSOR  Take 0.5 tablets (12.5 mg total) by mouth 2 (two) times daily.     neomycin-polymyxin b-dexamethasone 3.5-10000-0.1 Oint  Commonly known as:  MAXITROL  Place 1 application into both eyes 2 (two) times daily.     OXYGEN  Inhale 2 L into the lungs.     PRESERVISION AREDS 2 PO  Take 1 tablet by mouth 2 (two) times daily.     simvastatin 20 MG tablet  Commonly known as:  ZOCOR  Take 1 tablet (20 mg total) by mouth daily.     solifenacin 5 MG tablet  Commonly known as:  VESICARE  Take 1 tablet (5 mg total) by mouth daily.     tamsulosin 0.4 MG Caps capsule  Commonly known as:  FLOMAX  Take 1 capsule (0.4 mg total) by mouth daily.     tobramycin 0.3 % ophthalmic solution  Commonly known as:  TOBREX  Place 1 drop into both eyes 2 (two) times daily.     traMADol 50 MG tablet  Commonly known as:  ULTRAM  Take 1 tablet (50 mg total) by mouth every 8 (eight) hours as needed.        Immunizations: Immunization History  Administered Date(s) Administered  . Influenza Split 05/31/2012  . Influenza Whole 05/28/2008, 06/18/2009, 04/26/2010  . Influenza,inj,Quad PF,36+ Mos 06/07/2013, 05/26/2014  . PPD Test 11/16/2015  . Pneumococcal Conjugate-13 12/01/2014  . Pneumococcal Polysaccharide-23 06/10/1998  . Td 06/10/1998, 05/31/2012  . Zoster 11/10/2010     Physical Exam: Filed Vitals:   11/18/15 1223  BP: 116/55  Pulse: 64  Temp: 97.1 F (36.2 C)  TempSrc: Oral  Resp: 18  Height: 5\' 5"  (1.651 m)  Weight: 131 lb 3.2 oz (59.512 kg)  SpO2: 94%   Body mass index is 21.83 kg/(m^2).  General- elderly frail and ill appearing  male patient, in no acute distress Head- normocephalic, atraumatic Nose- no maxillary or frontal sinus tenderness, + clear nasal discharge Throat- moist mucus membrane Eyes- PERRLA, EOMI, no pallor, no icterus, no discharge, normal conjunctiva, ectropion + Neck- no cervical lymphadenopathy Cardiovascular- normal s1,s2, no murmur, trace leg edema Respiratory- bilateral poor air movement with rhonchi + , no wheeze, no crackles, no use of accessory muscles, on o2 Abdomen- bowel sounds present, soft, non tender Musculoskeletal- able to move all 4 extremities, generalized weakness Neurological- alert and oriented to  person, place and time Skin- warm and dry, actinic keratosis to his scalp, scab to right shin Psychiatry- normal mood and affect    Labs reviewed: Basic Metabolic Panel:  Recent Labs  11/14/15 0527 11/15/15 0528 11/16/15 11/16/15 0457  NA 137 136 138 138  K 3.6 3.6  --  3.9  CL 93* 93*  --  96*  CO2 32 32  --  32  GLUCOSE 97 109*  --  99  BUN 45* 57* 57* 57*  CREATININE 2.10* 2.40* 1.8* 1.82*  CALCIUM 8.4* 8.4*  --  8.4*  MG  --   --   --  1.9   Liver Function Tests:  Recent Labs  08/15/15 1733 10/20/15 1251 11/12/15 1924  AST 27 22 38  ALT 22 18 39  ALKPHOS 98 100 107  BILITOT 0.7 0.4 0.9  PROT 6.2* 7.3 8.0  ALBUMIN 3.0* 3.8 4.1   No results for input(s): LIPASE, AMYLASE in the last 8760 hours. No results for input(s): AMMONIA in the last 8760 hours. CBC:  Recent Labs  08/14/15 1441  08/15/15 1733 10/20/15 1251 11/12/15 1924 11/14/15 11/14/15 0527  WBC 9.1  --  8.8 10.2 11.9* 9.2 9.2  NEUTROABS 6.1  --  5.7 6.6  --   --   --   HGB 13.7  < > 13.0 13.6 13.1  --  11.2*  HCT 42.2  < > 40.4 41.2 39.1  --  33.6*  MCV 82.3  --  82.1 81.5 80.8  --  81.0  PLT 262  --  251 326.0 280  --  263  < > = values in this interval not displayed.   Radiological Exams: Dg Chest 2 View  11/12/2015  CLINICAL DATA:  Shortness of breath, productive cough. EXAM:  CHEST  2 VIEW COMPARISON:  August 14, 2015 FINDINGS: The heart size and mediastinal contours are stable. There is no focal infiltrate, pulmonary edema, or pleural effusion. With minimal atelectasis of both lung bases are noted. The visualized skeletal structures are unremarkable. IMPRESSION: No active cardiopulmonary disease. Electronically Signed   By: Abelardo Diesel M.D.   On: 11/12/2015 20:27   Ct Abdomen Pelvis W Contrast  11/12/2015  CLINICAL DATA:  Abdominal distention. EXAM: CT ABDOMEN AND PELVIS WITH CONTRAST TECHNIQUE: Multidetector CT imaging of the abdomen and pelvis was performed using the standard protocol following bolus administration of intravenous contrast. CONTRAST:  113mL OMNIPAQUE IOHEXOL 300 MG/ML  SOLN COMPARISON:  None. FINDINGS: Lower chest and abdominal wall: Bibasilar emphysema. Extensive atherosclerotic calcification. Fatty left inguinal hernia. Hepatobiliary: Few and small incidental cysts in the liver.Cholelithiasis without signs of obstruction or inflammation. Pancreas: Unremarkable. Spleen: Unremarkable. Adrenals/Urinary Tract: Negative adrenals. Renal atrophy. Bilateral renal cysts; the largest is exophytic from the right lower pole measuring 53 mm and with benign peripheral calcification. No hydronephrosis or ureteral calculus. Unremarkable bladder. Reproductive:Symmetric prostate enlargement. Stomach/Bowel: Large volume of gas distending the entire colon. Stool retention is mild. No volvulus or impaction. No definitive over distension as usually seen with colonic ileus. No appendicitis. Vascular/Lymphatic: Advanced and diffuse atherosclerotic disease with 29 mm aortic terminus fusiform aneurysm. No acute vascular finding. No mass or adenopathy. Peritoneal: No ascites or pneumoperitoneum. Musculoskeletal: Remote left femoral neck fracture status post ORIF. A portion of the fracture is still visible but there is bridging bone. T12 compression fracture with acute appearance. Height  loss is approximately 50%. Mild posterior retropulsion without canal stenosis. No subluxation or posterior element fracturing. Lumbar facet arthropathy with L4-5  and L5-S1 anterolisthesis. IMPRESSION: 1. Acute T12 compression fracture with moderate height loss and mild retropulsion. 2. Abdominal distention correlates with diffuse gaseous distention of colon. No mechanical obstruction. 3. Chronic findings are described above. Electronically Signed   By: Monte Fantasia M.D.   On: 11/12/2015 22:28    Assessment/Plan  Physical deconditioning Will have him work with physical therapy and occupational therapy team to help with gait training and muscle strengthening exercises.fall precautions. Skin care. Encourage to be out of bed.   Acute respiratory failure In setting of chf exacerbation. Wean off o2 as tolerated. Add incentive spirometer to help with his airways  Diastolic chf Check daily weight x 2 weeks, then three times a week. Continue lasix 20 mg daily, lopressor 12.5 mg bid and monitor. Check bmp  T12 compression fracture Back precautions and to work with therapy. Continue tramadol 50 mg q8h prn pain and monitor  Copd Continue o2 and wean as tolerated. Continue proventil as needed  TIA Continue zocor 20 mg daily.   Cough With rhonchi on lung exam, has hx of chf, copd and tobacco use. Concern for aspiration at present with aspiration pneumonitis. Will get cxr pa/lateral view to assess further. Strict aspiration precautions. Will need SLP to evaluate further. Continue prn proventil for now. Add mucinex   Protein calorie malnutrition Monitor po intake and weight. Continue MVI. Add procel 2 scoop bid for now  HLD Continue zocor 20 mg daily  Constipation continue colace 100 mg bid, hydration to be maintained  BPH Denies urine retention. Continue vesicare and flomax. Will have condome catheter for comfort reason  AKI Check bmp with him on lasix.   afib Rate controlled. Continue  metoprolol for rate control.    Goals of care: short term rehabilitation   Labs/tests ordered: cbc, cmp  Family/ staff Communication: reviewed care plan with patient and nursing supervisor    Blanchie Serve, MD Internal Medicine [address] [place], [place] [address] Cell Phone (Monday-Friday 8 am - 5 pm): [phone] On Call: [phone] and follow prompts after 5 pm and on weekends Office Phone: [phone] Office Fax: [phone]

## 2015-11-18 NOTE — Telephone Encounter (Signed)
Re-print of prescription for tramadol 50 mg tablets

## 2015-12-01 ENCOUNTER — Non-Acute Institutional Stay (SKILLED_NURSING_FACILITY): Payer: Medicare Other | Admitting: Family

## 2015-12-01 DIAGNOSIS — I5033 Acute on chronic diastolic (congestive) heart failure: Secondary | ICD-10-CM

## 2015-12-01 DIAGNOSIS — I110 Hypertensive heart disease with heart failure: Secondary | ICD-10-CM | POA: Diagnosis not present

## 2015-12-01 DIAGNOSIS — N400 Enlarged prostate without lower urinary tract symptoms: Secondary | ICD-10-CM | POA: Diagnosis not present

## 2015-12-01 DIAGNOSIS — IMO0001 Reserved for inherently not codable concepts without codable children: Secondary | ICD-10-CM

## 2015-12-01 DIAGNOSIS — M4850XG Collapsed vertebra, not elsewhere classified, site unspecified, subsequent encounter for fracture with delayed healing: Secondary | ICD-10-CM

## 2015-12-01 DIAGNOSIS — I4891 Unspecified atrial fibrillation: Secondary | ICD-10-CM

## 2015-12-01 DIAGNOSIS — G459 Transient cerebral ischemic attack, unspecified: Secondary | ICD-10-CM | POA: Diagnosis not present

## 2015-12-01 DIAGNOSIS — R269 Unspecified abnormalities of gait and mobility: Secondary | ICD-10-CM | POA: Diagnosis not present

## 2015-12-01 NOTE — Progress Notes (Signed)
Patient ID: [name], male   DOB: Jul 30, 1925, 82 y.o.   MRN: [id number]  Location:  [facility] Service:  SNF [phone])  Provider: Blanchie Serve, MD   PCP: [name], MD Patient Care Team: Tonia Ghent, MD as PCP - General  Extended Emergency Contact Information Primary Emergency Contact: [facility] Address: [address]          [place], [place] [address] Johnnette Litter of [place] Phone: [phone] Mobile Phone: [phone] Relation: Spouse Secondary Emergency Contact: Hamza,[name] Address: [address]          [place],  [address] Johnnette Litter of [place] Work Phone: [phone] Mobile Phone: [phone] Relation: Son  Code Status:Full Code  Goals of care:  Advanced Directive information Advanced Directives 11/12/2015  Does patient have an advance directive? No  Would patient like information on creating an advanced directive? No - patient declined information     No Known Allergies  Chief Complaint  Patient presents with  . Discharge Note    HPI:  82 y.o. male seen today at  [facility] for discharge home. He was here for short term rehabilitation post hospital admission from 11/12/15-11/16/15 with acute hypoxic respiratory failure in setting of COPD and CHF exacerbation. He had T12 compression frcature. He also had SVT runs and was started on metoprolol. He has a medical history of chronic diastolic heart failure, afib, HTN, HLD, stroke among others. He is seen in his room today with his wife and sister in law present. He denies any acute issues today. He has worked well with PT/OT now stable to discharge home to continue with PT/OT for ROM, Exercise, gait stability and muscle strengthening. He will also require a [facility] [facility] to assist with ADL's. He does not require any DME ordered. Facility social worker will arrange for Home health services prior to discharge.     Past Medical History    Diagnosis Date  . Hypertension   . Hyperlipidemia   . Tobacco abuse   . Lung nodule     Left lung, seen 2012, no change in 2012, no follow up needed   . PNA (pneumonia) 2010    Bilateral Pneumonia, COPD 67mm LLL Nodule   . History of ETT 09/1991     POS   . History of MRI 04-22-06    L/S- right hnp  l5/si   . B12 deficiency   . TIA (transient ischemic attack)   . GERD (gastroesophageal reflux disease)   . Esophageal stricture   . Hemorrhoids   . Colon polyps     hyperplastic  . Pneumonia   . Macular degeneration   . Stroke [facility])     Past Surgical History  Procedure Laterality Date  . Cystectomy  1995    Lumbar area   . Hip pinning,cannulated Left 03/28/2015    Procedure: CANNULATED HIP PINNING;  Surgeon: [name], MD;  Location: [facility];  Service: Orthopedics;  Laterality: Left;      reports that he quit smoking about 30 years ago. His smoking use included Cigarettes. He has quit using smokeless tobacco. His smokeless tobacco use included Chew. He reports that he does not drink alcohol or use illicit drugs. Social History   Social History  . Marital Status: Married    Spouse Name: N/A  . Number of Children: 2  . Years of Education: N/A   Occupational History  . 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 Smoking status: Former Smoker    Types: Cigarettes    Quit date: 08/29/1985  . Smokeless tobacco: Former Systems developer    Types: Chew  . Alcohol Use: No  . Drug Use: No  . Sexual Activity: Not on file   Other Topics Concern  . 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 INFLUENZA VACCINE  03/29/2016  . PNA vac Low Risk Adult   Completed    Medications:   Medication List       This list is accurate as of: 12/01/15  7:52 PM.  Always use your most recent med list.               albuterol 108 (90 Base) MCG/ACT inhaler  Commonly known as:  PROVENTIL HFA;VENTOLIN HFA  Inhale 2 puffs into the lungs every 6 (six) hours as needed for wheezing or shortness of breath.     docusate sodium 100 MG capsule  Commonly known as:  COLACE  Take 100 mg by mouth 2 (two) times daily.     furosemide 20 MG tablet  Commonly known as:  LASIX  Take 1 tablet (20 mg total) by mouth daily.     metoprolol tartrate 25 MG tablet  Commonly known as:  LOPRESSOR  Take 0.5 tablets (12.5 mg total) by mouth 2 (two) times daily.     neomycin-polymyxin b-dexamethasone 3.5-10000-0.1 Oint  Commonly known as:  MAXITROL  Place 1 application into both eyes 2 (two) times daily.     oxybutynin 5 MG 24 hr tablet  Commonly known as:  DITROPAN-XL  Take 5 mg by mouth at bedtime.     OXYGEN  Inhale 2 L into the lungs.     PRESERVISION AREDS 2 PO  Take 1 tablet by mouth 2 (two) times daily.     simvastatin 20 MG tablet  Commonly known as:  ZOCOR  Take 1 tablet (20 mg total) by mouth daily.     tamsulosin 0.4 MG Caps capsule  Commonly known as:  FLOMAX  Take 1 capsule (0.4 mg total) by mouth daily.     tobramycin 0.3 % ophthalmic solution  Commonly known as:  TOBREX  Place 1 drop into both eyes 2 (two) times daily.     traMADol 50 MG tablet  Commonly known as:  ULTRAM  Take 1 tablet (50 mg total) by mouth every 8 (eight) hours as needed.        Review of Systems  Constitutional: Negative for fever, chills, activity change, appetite change and fatigue.  HENT: Negative.   Eyes: Negative.   Respiratory: Negative for chest tightness, shortness of breath and wheezing.   Cardiovascular: Negative for chest pain, palpitations and leg swelling.  Gastrointestinal: Negative for nausea, vomiting, abdominal pain, diarrhea, constipation and  abdominal distention.  Endocrine: Negative.   Genitourinary: Negative for dysuria, urgency, hematuria and flank pain.  Musculoskeletal: Positive for gait problem.  Skin: Negative.   Allergic/Immunologic: Negative.   Neurological: Negative.   Hematological: Negative.   Psychiatric/Behavioral: Negative.     Filed Vitals:   12/01/15  1030  BP: 139/71  Pulse: 74  Temp: 98 F (36.7 C)  Resp: 18  Height: 5\' 5"  (1.651 m)  Weight: 136 lb 11.2 oz (62.007 kg)  SpO2: 96%   Body mass index is 22.75 kg/(m^2). Physical Exam  Constitutional: No distress.  Frail Elderly in no acute distress   HENT:  Head: Normocephalic.  Mouth/Throat: Oropharynx is clear and moist.  Eyes: Conjunctivae and EOM are normal. Pupils are equal, round, and reactive to light. Right eye exhibits no discharge. Left eye exhibits no discharge. No scleral icterus.  Neck: Normal range of motion. No JVD present. No thyromegaly present.  Cardiovascular: Normal rate, regular rhythm, normal heart sounds and intact distal pulses.  Exam reveals no gallop and no friction rub.   No murmur heard. Pulmonary/Chest: Effort normal and breath sounds normal. No respiratory distress. He has no wheezes. He has no rales.  Abdominal: Soft. Bowel sounds are normal. He exhibits no distension and no mass. There is no tenderness. There is no rebound and no guarding.  Musculoskeletal: Normal range of motion. He exhibits no edema or tenderness.  unsteady  Lymphadenopathy:    He has no cervical adenopathy.  Neurological: He is alert.  Skin: Skin is warm and dry. No rash noted. No erythema. No pallor.  Psychiatric: He has a normal mood and affect.    Labs reviewed: Basic Metabolic Panel:  Recent Labs  11/14/15 0527 11/15/15 0528 11/16/15 11/16/15 0457  NA 137 136 138 138  K 3.6 3.6  --  3.9  CL 93* 93*  --  96*  CO2 32 32  --  32  GLUCOSE 97 109*  --  99  BUN 45* 57* 57* 57*  CREATININE 2.10* 2.40* 1.8* 1.82*  CALCIUM 8.4* 8.4*  --   8.4*  MG  --   --   --  1.9   Liver Function Tests:  Recent Labs  08/15/15 1733 10/20/15 1251 11/12/15 1924  AST 27 22 38  ALT 22 18 39  ALKPHOS 98 100 107  BILITOT 0.7 0.4 0.9  PROT 6.2* 7.3 8.0  ALBUMIN 3.0* 3.8 4.1   No results for input(s): LIPASE, AMYLASE in the last 8760 hours. No results for input(s): AMMONIA in the last 8760 hours. CBC:  Recent Labs  08/14/15 1441  08/15/15 1733 10/20/15 1251 11/12/15 1924 11/14/15 11/14/15 0527  WBC 9.1  --  8.8 10.2 11.9* 9.2 9.2  NEUTROABS 6.1  --  5.7 6.6  --   --   --   HGB 13.7  < > 13.0 13.6 13.1  --  11.2*  HCT 42.2  < > 40.4 41.2 39.1  --  33.6*  MCV 82.3  --  82.1 81.5 80.8  --  81.0  PLT 262  --  251 326.0 280  --  263  < > = values in this interval not displayed. Cardiac Enzymes: No results for input(s): CKTOTAL, CKMB, CKMBINDEX, TROPONINI in the last 8760 hours. BNP: Invalid input(s): POCBNP CBG: No results for input(s): GLUCAP in the last 8760 hours.  Procedures and Imaging Studies During Stay: Dg Chest 2 View  11/12/2015  CLINICAL DATA:  Shortness of breath, productive cough. EXAM: CHEST  2 VIEW COMPARISON:  August 14, 2015 FINDINGS: The heart size and mediastinal contours are stable. There is no focal infiltrate, pulmonary edema, or pleural effusion. With minimal atelectasis of both lung bases are noted. The visualized skeletal structures are unremarkable. IMPRESSION: No active cardiopulmonary disease. Electronically Signed   By:  Abelardo Diesel M.D.   On: 11/12/2015 20:27   Ct Abdomen Pelvis W Contrast  11/12/2015  CLINICAL DATA:  Abdominal distention. EXAM: CT ABDOMEN AND PELVIS WITH CONTRAST TECHNIQUE: Multidetector CT imaging of the abdomen and pelvis was performed using the standard protocol following bolus administration of intravenous contrast. CONTRAST:  128mL OMNIPAQUE IOHEXOL 300 MG/ML  SOLN COMPARISON:  None. FINDINGS: Lower chest and abdominal wall: Bibasilar emphysema. Extensive atherosclerotic  calcification. Fatty left inguinal hernia. Hepatobiliary: Few and small incidental cysts in the liver.Cholelithiasis without signs of obstruction or inflammation. Pancreas: Unremarkable. Spleen: Unremarkable. Adrenals/Urinary Tract: Negative adrenals. Renal atrophy. Bilateral renal cysts; the largest is exophytic from the right lower pole measuring 53 mm and with benign peripheral calcification. No hydronephrosis or ureteral calculus. Unremarkable bladder. Reproductive:Symmetric prostate enlargement. Stomach/Bowel: Large volume of gas distending the entire colon. Stool retention is mild. No volvulus or impaction. No definitive over distension as usually seen with colonic ileus. No appendicitis. Vascular/Lymphatic: Advanced and diffuse atherosclerotic disease with 29 mm aortic terminus fusiform aneurysm. No acute vascular finding. No mass or adenopathy. Peritoneal: No ascites or pneumoperitoneum. Musculoskeletal: Remote left femoral neck fracture status post ORIF. A portion of the fracture is still visible but there is bridging bone. T12 compression fracture with acute appearance. Height loss is approximately 50%. Mild posterior retropulsion without canal stenosis. No subluxation or posterior element fracturing. Lumbar facet arthropathy with L4-5 and L5-S1 anterolisthesis. IMPRESSION: 1. Acute T12 compression fracture with moderate height loss and mild retropulsion. 2. Abdominal distention correlates with diffuse gaseous distention of colon. No mechanical obstruction. 3. Chronic findings are described above. Electronically Signed   By: Monte Fantasia M.D.   On: 11/12/2015 22:28    Assessment/Plan:   1. Hypertensive heart disease with CHF (congestive heart failure) (HCC) B/p stable.continue on metoprolol and Furosemide.Follow up with PCP to monitor BMP.   2. Diastolic CHF, acute on chronic (HCC) Stable. Continue on Diuretic. Monitor daily weight.   3. Transient cerebral ischemia, unspecified transient  cerebral ischemia type No recent episodes. Continue to control high risks factors. Continue on Zocor 20 mg Tablet and Metoprolol   4. Atrial fibrillation with RVR (HCC) HR controlled on Metoroplol.   5. Vertebral compression fracture, with delayed healing, subsequent encounter T12 compression fracture. Continue with Back precautions and to work with therapy. Continue tramadol 50 mg q8h prn pain. Fall and safety precautions. Will discharge home with PT/OT for ROM, Exercise, gait stability and muscle strengthening. He will also require a [facility] [facility] to assist with ADL's. He does not require any DME ordered.  6. Abnormality of gait Fall and safety precautions. PT/OT for ROM, Exercise, gait stability and muscle strengthening. He does not require any DME ordered.  7. BPH (benign prostatic hyperplasia) Stable. Continue on Oxybutynin and Tamsulosin.    Patient is being discharged with the following home health services:   PT/OT for ROM, Exercise, gait stability and muscle strengthening.  [facility] to assist with ADL's.   Patient is being discharged with the following durable medical equipment:    No DME    Patient has been advised to f/u with their PCP in 1-2 weeks to bring them up to date on their rehab stay.  Social services at facility was responsible for arranging this appointment.  Pt was provided with a 30 day supply of prescriptions for medications and refills must be obtained from their PCP.  For controlled substances, a more limited supply may be provided adequate until PCP appointment only.  Future labs/tests  needed:  CBC, BMP with PCP

## 2015-12-08 ENCOUNTER — Telehealth: Payer: Self-pay

## 2015-12-08 NOTE — Telephone Encounter (Signed)
Please give the order.  Thanks.   

## 2015-12-08 NOTE — Telephone Encounter (Signed)
Junie Panning PT with [facility] left v/m requesting verbal order for home health PT 2 x a week for 8 more weeks for strenghening, gait training, balance and home safety. Also requesting verbal order for home health aide to assist with personal care 1 x a week for 8 weeks. Having delay in OT services due to staffing.

## 2015-12-09 NOTE — Telephone Encounter (Signed)
Order given to [facility] as instructed by telephone.

## 2015-12-09 NOTE — Telephone Encounter (Signed)
Erin with [facility] returned Regina's call. If Erin doesn't answer, Junie Panning said she has a confidential voice mail and Rollene Fare can leave a detailed message.

## 2015-12-09 NOTE — Telephone Encounter (Signed)
Left message on voicemail for Erin to call back. 

## 2015-12-17 ENCOUNTER — Encounter: Payer: Self-pay | Admitting: Family Medicine

## 2015-12-17 ENCOUNTER — Ambulatory Visit (INDEPENDENT_AMBULATORY_CARE_PROVIDER_SITE_OTHER): Payer: Medicare Other | Admitting: Family Medicine

## 2015-12-17 VITALS — BP 136/66 | HR 58 | Temp 98.1°F | Wt 132.2 lb

## 2015-12-17 DIAGNOSIS — M4854XS Collapsed vertebra, not elsewhere classified, thoracic region, sequela of fracture: Secondary | ICD-10-CM

## 2015-12-17 DIAGNOSIS — S22080S Wedge compression fracture of T11-T12 vertebra, sequela: Secondary | ICD-10-CM

## 2015-12-17 DIAGNOSIS — I110 Hypertensive heart disease with heart failure: Secondary | ICD-10-CM | POA: Diagnosis not present

## 2015-12-17 DIAGNOSIS — Z6379 Other stressful life events affecting family and household: Secondary | ICD-10-CM

## 2015-12-17 DIAGNOSIS — R399 Unspecified symptoms and signs involving the genitourinary system: Secondary | ICD-10-CM | POA: Diagnosis not present

## 2015-12-17 LAB — COMPREHENSIVE METABOLIC PANEL
ALBUMIN: 3.4 g/dL — AB (ref 3.5–5.2)
ALK PHOS: 122 U/L — AB (ref 39–117)
ALT: 15 U/L (ref 0–53)
AST: 17 U/L (ref 0–37)
BILIRUBIN TOTAL: 0.4 mg/dL (ref 0.2–1.2)
BUN: 18 mg/dL (ref 6–23)
CO2: 37 mEq/L — ABNORMAL HIGH (ref 19–32)
Calcium: 9.3 mg/dL (ref 8.4–10.5)
Chloride: 90 mEq/L — ABNORMAL LOW (ref 96–112)
Creatinine, Ser: 1 mg/dL (ref 0.40–1.50)
GFR: 74.79 mL/min (ref >60.00)
GLUCOSE: 115 mg/dL — AB (ref 70–99)
Potassium: 3.7 mEq/L (ref 3.5–5.1)
SODIUM: 133 meq/L — AB (ref 135–145)
TOTAL PROTEIN: 7.4 g/dL (ref 6.0–8.3)

## 2015-12-17 LAB — CBC WITH DIFFERENTIAL/PLATELET
BASOS ABS: 0 10*3/uL (ref 0.0–0.1)
Basophils Relative: 0.4 % (ref 0.0–3.0)
EOS ABS: 0.1 10*3/uL (ref 0.0–0.7)
Eosinophils Relative: 1.3 % (ref 0.0–5.0)
HCT: 37 % — ABNORMAL LOW (ref 39.0–52.0)
HEMOGLOBIN: 12.1 g/dL — AB (ref 13.0–17.0)
LYMPHS ABS: 1.8 10*3/uL (ref 0.7–4.0)
Lymphocytes Relative: 18.8 % (ref 12.0–46.0)
MCHC: 32.7 g/dL (ref 30.0–36.0)
MCV: 80.6 fl (ref 78.0–100.0)
MONO ABS: 0.6 10*3/uL (ref 0.1–1.0)
Monocytes Relative: 6.1 % (ref 3.0–12.0)
Neutro Abs: 6.8 10*3/uL (ref 1.4–7.7)
Neutrophils Relative %: 73.4 % (ref 43.0–77.0)
PLATELETS: 324 10*3/uL (ref 150.0–400.0)
RBC: 4.59 Mil/uL (ref 4.22–5.81)
RDW: 15.8 % — AB (ref 11.5–15.5)
WBC: 9.3 10*3/uL (ref 4.0–10.5)

## 2015-12-17 MED ORDER — OXYBUTYNIN CHLORIDE ER 5 MG PO TB24: 5.0000 mg | ORAL_TABLET | Freq: Every day | ORAL | Status: DC

## 2015-12-17 MED ORDER — TRAMADOL HCL 50 MG PO TABS: 50.0000 mg | ORAL_TABLET | Freq: Two times a day (BID) | ORAL | Status: DC | PRN

## 2015-12-17 MED ORDER — HYDROCODONE-ACETAMINOPHEN 5-325 MG PO TABS: 0.5000 | ORAL_TABLET | Freq: Four times a day (QID) | ORAL | Status: DC | PRN

## 2015-12-17 NOTE — Patient Instructions (Signed)
We'll work on the authorization for the oxybutynin.   Go to the lab on the way out.  We'll contact you with your lab report. Use the tramadol as needed for pain.  If that doesn't help then try the hydrocodone.  It can make you drowsy and constipated.  Continue the lasix daily for now.  If your weight starts to increase then let me know.  If you gain more than 3 lbs in a day or if you get above 135 at home, then call us.  Take care.  I'll be thinking about you and your family.  Glad to see you.

## 2015-12-17 NOTE — Progress Notes (Signed)
Pre visit review using our clinic review tool, if applicable. No additional management support is needed unless otherwise documented below in the visit note.  Wife currently on hospice for liver disease. "I don't even know how to feel."  D/w pt about pending loss and he is appropriate.  He has family support.  He is stoic but accepting of the recent events with her.  He is glad that she has been comfortable on hospice care.    LUTS.  Frequency, feeling of incomplete voiding.  Not burning.  Still on flomax, wasn't able to get ditropan, d/w pt about getting PA started.  We'll work on this.    Back pain, known fx hx d/w pt.  Some relief with tramadol but transient and mild relief.  Only last a few hours.  D/w pt.  Had used hydrocodone prev, d/w pt about opiate cautions.    Prev admission for CHF with fluid overload and then diuresis.  Compliant with meds.  S/p [facility], with some improvement in the meantime.  Still with deconditioning noted at baseline.  Yolanda Bonine- who is an adult- is living with patient in the meantime, essentially given round the clock care and patient is appreciative.  He is minimally SOB now- much better than prev.  Some occ sputum but no fevers.    PMH and SH reviewed  ROS: See HPI, otherwise noncontributory.  Meds, vitals, and allergies reviewed.   GEN: nad, alert and oriented, stoic, in wheelchair HEENT: mucous membranes moist NECK: supple w/o LA CV: rrr.  PULM: ctab superiorly, with minimal dec in BS at the B bases, no inc wob, no wheeze, no rales.   ABD: soft, +bs EXT: no edema SKIN: no acute rash

## 2015-12-18 DIAGNOSIS — Z6379 Other stressful life events affecting family and household: Secondary | ICD-10-CM | POA: Insufficient documentation

## 2015-12-18 NOTE — Assessment & Plan Note (Addendum)
They'll check daily weight and update me.  Okay for outpatient f/u.  I didn't change lasix at this point- taken daily.  Still okay for outpatient f/u with sig family help.  See notes on labs. .  >25 minutes spent in face to face time with patient, >50% spent in counselling or coordination of care.

## 2015-12-18 NOTE — Assessment & Plan Note (Signed)
Still on flomax, wasn't able to get ditropan, d/w pt about getting PA started. We'll work on this.

## 2015-12-18 NOTE — Assessment & Plan Note (Signed)
Death of wife expected in the near future.  D/w pt.  Condolences offered.  He was appropriate and has family support.  He'll update me as needed.

## 2015-12-18 NOTE — Assessment & Plan Note (Signed)
Continue tramadol, can use 1/2 tab of vicodin if needed, with sedation and constipation cautions.

## 2015-12-21 ENCOUNTER — Emergency Department (HOSPITAL_COMMUNITY): Payer: Medicare Other

## 2015-12-21 ENCOUNTER — Inpatient Hospital Stay (HOSPITAL_COMMUNITY)
Admission: EM | Admit: 2015-12-21 | Discharge: 2015-12-23 | DRG: 871 | Disposition: A | Discharge status: Skilled Nursing Facility | Payer: Medicare Other | Attending: Internal Medicine | Admitting: Internal Medicine

## 2015-12-21 ENCOUNTER — Encounter (HOSPITAL_COMMUNITY): Payer: Self-pay | Admitting: Emergency Medicine

## 2015-12-21 DIAGNOSIS — I5032 Chronic diastolic (congestive) heart failure: Secondary | ICD-10-CM | POA: Diagnosis present

## 2015-12-21 DIAGNOSIS — M4854XS Collapsed vertebra, not elsewhere classified, thoracic region, sequela of fracture: Secondary | ICD-10-CM

## 2015-12-21 DIAGNOSIS — G459 Transient cerebral ischemic attack, unspecified: Secondary | ICD-10-CM | POA: Diagnosis present

## 2015-12-21 DIAGNOSIS — Z8249 Family history of ischemic heart disease and other diseases of the circulatory system: Secondary | ICD-10-CM | POA: Diagnosis not present

## 2015-12-21 DIAGNOSIS — Z720 Tobacco use: Secondary | ICD-10-CM

## 2015-12-21 DIAGNOSIS — I10 Essential (primary) hypertension: Secondary | ICD-10-CM | POA: Diagnosis present

## 2015-12-21 DIAGNOSIS — S22080A Wedge compression fracture of T11-T12 vertebra, initial encounter for closed fracture: Secondary | ICD-10-CM | POA: Diagnosis present

## 2015-12-21 DIAGNOSIS — E7800 Pure hypercholesterolemia, unspecified: Secondary | ICD-10-CM | POA: Diagnosis present

## 2015-12-21 DIAGNOSIS — N189 Chronic kidney disease, unspecified: Secondary | ICD-10-CM | POA: Diagnosis present

## 2015-12-21 DIAGNOSIS — M4854XA Collapsed vertebra, not elsewhere classified, thoracic region, initial encounter for fracture: Secondary | ICD-10-CM | POA: Diagnosis present

## 2015-12-21 DIAGNOSIS — R911 Solitary pulmonary nodule: Secondary | ICD-10-CM | POA: Diagnosis present

## 2015-12-21 DIAGNOSIS — J690 Pneumonitis due to inhalation of food and vomit: Secondary | ICD-10-CM | POA: Diagnosis present

## 2015-12-21 DIAGNOSIS — I130 Hypertensive heart and chronic kidney disease with heart failure and stage 1 through stage 4 chronic kidney disease, or unspecified chronic kidney disease: Secondary | ICD-10-CM | POA: Diagnosis present

## 2015-12-21 DIAGNOSIS — N179 Acute kidney failure, unspecified: Secondary | ICD-10-CM | POA: Diagnosis present

## 2015-12-21 DIAGNOSIS — J9601 Acute respiratory failure with hypoxia: Secondary | ICD-10-CM | POA: Diagnosis present

## 2015-12-21 DIAGNOSIS — K224 Dyskinesia of esophagus: Secondary | ICD-10-CM | POA: Diagnosis present

## 2015-12-21 DIAGNOSIS — I248 Other forms of acute ischemic heart disease: Secondary | ICD-10-CM | POA: Diagnosis present

## 2015-12-21 DIAGNOSIS — E860 Dehydration: Secondary | ICD-10-CM | POA: Diagnosis present

## 2015-12-21 DIAGNOSIS — E785 Hyperlipidemia, unspecified: Secondary | ICD-10-CM | POA: Diagnosis present

## 2015-12-21 DIAGNOSIS — K222 Esophageal obstruction: Secondary | ICD-10-CM | POA: Diagnosis present

## 2015-12-21 DIAGNOSIS — N289 Disorder of kidney and ureter, unspecified: Secondary | ICD-10-CM | POA: Diagnosis not present

## 2015-12-21 DIAGNOSIS — J440 Chronic obstructive pulmonary disease with acute lower respiratory infection: Secondary | ICD-10-CM | POA: Diagnosis present

## 2015-12-21 DIAGNOSIS — R1011 Right upper quadrant pain: Secondary | ICD-10-CM | POA: Diagnosis not present

## 2015-12-21 DIAGNOSIS — Y95 Nosocomial condition: Secondary | ICD-10-CM | POA: Diagnosis present

## 2015-12-21 DIAGNOSIS — K219 Gastro-esophageal reflux disease without esophagitis: Secondary | ICD-10-CM | POA: Diagnosis present

## 2015-12-21 DIAGNOSIS — R109 Unspecified abdominal pain: Secondary | ICD-10-CM | POA: Diagnosis present

## 2015-12-21 DIAGNOSIS — I471 Supraventricular tachycardia: Secondary | ICD-10-CM | POA: Diagnosis present

## 2015-12-21 DIAGNOSIS — R7989 Other specified abnormal findings of blood chemistry: Secondary | ICD-10-CM | POA: Diagnosis present

## 2015-12-21 DIAGNOSIS — R778 Other specified abnormalities of plasma proteins: Secondary | ICD-10-CM | POA: Diagnosis present

## 2015-12-21 DIAGNOSIS — Z79899 Other long term (current) drug therapy: Secondary | ICD-10-CM

## 2015-12-21 DIAGNOSIS — E876 Hypokalemia: Secondary | ICD-10-CM | POA: Diagnosis present

## 2015-12-21 DIAGNOSIS — J189 Pneumonia, unspecified organism: Secondary | ICD-10-CM | POA: Diagnosis not present

## 2015-12-21 DIAGNOSIS — Z8673 Personal history of transient ischemic attack (TIA), and cerebral infarction without residual deficits: Secondary | ICD-10-CM

## 2015-12-21 DIAGNOSIS — H3530 Unspecified macular degeneration: Secondary | ICD-10-CM | POA: Diagnosis present

## 2015-12-21 DIAGNOSIS — N182 Chronic kidney disease, stage 2 (mild): Secondary | ICD-10-CM | POA: Diagnosis present

## 2015-12-21 DIAGNOSIS — J9621 Acute and chronic respiratory failure with hypoxia: Secondary | ICD-10-CM | POA: Insufficient documentation

## 2015-12-21 DIAGNOSIS — T380X5A Adverse effect of glucocorticoids and synthetic analogues, initial encounter: Secondary | ICD-10-CM | POA: Diagnosis not present

## 2015-12-21 DIAGNOSIS — A419 Sepsis, unspecified organism: Principal | ICD-10-CM | POA: Diagnosis present

## 2015-12-21 DIAGNOSIS — N183 Chronic kidney disease, stage 3 unspecified: Secondary | ICD-10-CM | POA: Insufficient documentation

## 2015-12-21 DIAGNOSIS — N400 Enlarged prostate without lower urinary tract symptoms: Secondary | ICD-10-CM | POA: Diagnosis present

## 2015-12-21 DIAGNOSIS — I4719 Other supraventricular tachycardia: Secondary | ICD-10-CM | POA: Diagnosis present

## 2015-12-21 HISTORY — DX: Chronic kidney disease, stage 2 (mild): N18.2

## 2015-12-21 LAB — TROPONIN I
TROPONIN I: 0.09 ng/mL — AB (ref <0.031)
TROPONIN I: 0.08 ng/mL — AB (ref <0.031)
Troponin I: 0.14 ng/mL — ABNORMAL HIGH (ref <0.031)
Troponin I: 0.12 ng/mL — ABNORMAL HIGH (ref <0.031)

## 2015-12-21 LAB — POCT I-STAT 3, ART BLOOD GAS (G3+)
ACID-BASE EXCESS: 3 mmol/L — AB (ref 0.0–2.0)
Bicarbonate: 28.5 mEq/L — ABNORMAL HIGH (ref 20.0–24.0)
O2 Saturation: 100 %
PH ART: 7.382 (ref 7.350–7.450)
PO2 ART: 282 mmHg — AB (ref 80.0–100.0)
TCO2: 30 mmol/L (ref 0–100)
pCO2 arterial: 48.4 mmHg — ABNORMAL HIGH (ref 35.0–45.0)

## 2015-12-21 LAB — COMPREHENSIVE METABOLIC PANEL
ALBUMIN: 2.3 g/dL — AB (ref 3.5–5.0)
ALK PHOS: 107 U/L (ref 38–126)
ALT: 43 U/L (ref 17–63)
ANION GAP: 17 — AB (ref 5–15)
AST: 57 U/L — ABNORMAL HIGH (ref 15–41)
BILIRUBIN TOTAL: 0.8 mg/dL (ref 0.3–1.2)
BUN: 66 mg/dL — ABNORMAL HIGH (ref 6–20)
CO2: 27 mmol/L (ref 22–32)
CREATININE: 2.2 mg/dL — AB (ref 0.61–1.24)
Calcium: 8 mg/dL — ABNORMAL LOW (ref 8.9–10.3)
Chloride: 86 mmol/L — ABNORMAL LOW (ref 101–111)
GFR calc Af Amer: 29 mL/min — ABNORMAL LOW (ref >60)
GFR, EST NON AFRICAN AMERICAN: 25 mL/min — AB (ref >60)
Glucose, Bld: 116 mg/dL — ABNORMAL HIGH (ref 65–99)
Potassium: 3.3 mmol/L — ABNORMAL LOW (ref 3.5–5.1)
Sodium: 130 mmol/L — ABNORMAL LOW (ref 135–145)
Total Protein: 6.9 g/dL (ref 6.5–8.1)

## 2015-12-21 LAB — I-STAT CG4 LACTIC ACID, ED: Lactic Acid, Venous: 3.06 mmol/L (ref 0.5–2.0)

## 2015-12-21 LAB — INFLUENZA PANEL BY PCR (TYPE A & B)
H1N1 flu by pcr: NOT DETECTED
Influenza A By PCR: NEGATIVE
Influenza B By PCR: NEGATIVE

## 2015-12-21 LAB — PROTIME-INR
INR: 1.35 (ref 0.00–1.49)
PROTHROMBIN TIME: 16.8 s — AB (ref 11.6–15.2)

## 2015-12-21 LAB — CBC WITH DIFFERENTIAL/PLATELET
Basophils Absolute: 0 10*3/uL (ref 0.0–0.1)
Basophils Relative: 0 %
EOS ABS: 0 10*3/uL (ref 0.0–0.7)
Eosinophils Relative: 0 %
HEMATOCRIT: 35.6 % — AB (ref 39.0–52.0)
HEMOGLOBIN: 11.9 g/dL — AB (ref 13.0–17.0)
LYMPHS ABS: 1 10*3/uL (ref 0.7–4.0)
LYMPHS PCT: 6 %
MCH: 26.3 pg (ref 26.0–34.0)
MCHC: 33.4 g/dL (ref 30.0–36.0)
MCV: 78.6 fL (ref 78.0–100.0)
MONOS PCT: 6 %
Monocytes Absolute: 1.1 10*3/uL — ABNORMAL HIGH (ref 0.1–1.0)
NEUTROS PCT: 88 %
Neutro Abs: 16.9 10*3/uL — ABNORMAL HIGH (ref 1.7–7.7)
PLATELETS: 264 10*3/uL (ref 150–400)
RBC: 4.53 MIL/uL (ref 4.22–5.81)
RDW: 15.1 % (ref 11.5–15.5)
WBC: 19 10*3/uL — ABNORMAL HIGH (ref 4.0–10.5)

## 2015-12-21 LAB — LIPASE, BLOOD: LIPASE: 15 U/L (ref 11–51)

## 2015-12-21 LAB — MRSA PCR SCREENING: MRSA by PCR: NEGATIVE

## 2015-12-21 LAB — STREP PNEUMONIAE URINARY ANTIGEN: Strep Pneumo Urinary Antigen: NEGATIVE

## 2015-12-21 LAB — BRAIN NATRIURETIC PEPTIDE: B Natriuretic Peptide: 496.3 pg/mL — ABNORMAL HIGH (ref 0.0–100.0)

## 2015-12-21 LAB — PROCALCITONIN: PROCALCITONIN: 3.83 ng/mL

## 2015-12-21 LAB — APTT: aPTT: 36 seconds (ref 24–37)

## 2015-12-21 LAB — LACTIC ACID, PLASMA: LACTIC ACID, VENOUS: 2.3 mmol/L — AB (ref 0.5–2.0)

## 2015-12-21 LAB — GLUCOSE, CAPILLARY: GLUCOSE-CAPILLARY: 130 mg/dL — AB (ref 65–99)

## 2015-12-21 MED ORDER — SODIUM CHLORIDE 0.9 % IV SOLN
INTRAVENOUS | Status: DC
  Administered 2015-12-21 – 2015-12-22 (×3): via INTRAVENOUS

## 2015-12-21 MED ORDER — TOBRAMYCIN 0.3 % OP SOLN
1.0000 [drp] | Freq: Two times a day (BID) | OPHTHALMIC | Status: DC
  Administered 2015-12-21 – 2015-12-23 (×5): 1 [drp] via OPHTHALMIC
  Filled 2015-12-21 (×2): qty 5

## 2015-12-21 MED ORDER — CEFEPIME HCL 1 G IJ SOLR
1.0000 g | INTRAMUSCULAR | Status: DC
Start: 2015-12-22 — End: 2015-12-21

## 2015-12-21 MED ORDER — LEVALBUTEROL HCL 1.25 MG/0.5ML IN NEBU
1.2500 mg | INHALATION_SOLUTION | Freq: Two times a day (BID) | RESPIRATORY_TRACT | Status: DC
  Administered 2015-12-21 – 2015-12-22 (×3): 1.25 mg via RESPIRATORY_TRACT
  Filled 2015-12-21 (×3): qty 0.5

## 2015-12-21 MED ORDER — HEPARIN SODIUM (PORCINE) 5000 UNIT/ML IJ SOLN
5000.0000 [IU] | Freq: Three times a day (TID) | INTRAMUSCULAR | Status: DC
  Administered 2015-12-21 – 2015-12-23 (×7): 5000 [IU] via SUBCUTANEOUS
  Filled 2015-12-21 (×7): qty 1

## 2015-12-21 MED ORDER — IPRATROPIUM BROMIDE 0.02 % IN SOLN: 0.5000 mg | RESPIRATORY_TRACT | Status: DC

## 2015-12-21 MED ORDER — OXYBUTYNIN CHLORIDE ER 5 MG PO TB24
5.0000 mg | ORAL_TABLET | Freq: Every day | ORAL | Status: DC
Start: 2015-12-21 — End: 2015-12-23
  Administered 2015-12-21 – 2015-12-22 (×2): 5 mg via ORAL
  Filled 2015-12-21 (×4): qty 1

## 2015-12-21 MED ORDER — TRAMADOL HCL 50 MG PO TABS
50.0000 mg | ORAL_TABLET | Freq: Two times a day (BID) | ORAL | Status: DC | PRN
  Administered 2015-12-21: 50 mg via ORAL
  Filled 2015-12-21: qty 1

## 2015-12-21 MED ORDER — POTASSIUM CHLORIDE CRYS ER 20 MEQ PO TBCR
20.0000 meq | EXTENDED_RELEASE_TABLET | Freq: Once | ORAL | Status: AC
  Administered 2015-12-21: 20 meq via ORAL
  Filled 2015-12-21: qty 1

## 2015-12-21 MED ORDER — LEVALBUTEROL HCL 1.25 MG/0.5ML IN NEBU
1.2500 mg | INHALATION_SOLUTION | Freq: Four times a day (QID) | RESPIRATORY_TRACT | Status: DC
  Filled 2015-12-21: qty 0.5

## 2015-12-21 MED ORDER — DEXTROSE 5 % IV SOLN: 1.0000 g | Freq: Three times a day (TID) | INTRAVENOUS | Status: DC

## 2015-12-21 MED ORDER — NEOMYCIN-POLYMYXIN-DEXAMETH 3.5-10000-0.1 OP OINT
1.0000 "application " | TOPICAL_OINTMENT | Freq: Two times a day (BID) | OPHTHALMIC | Status: DC
  Administered 2015-12-21 – 2015-12-23 (×5): 1 via OPHTHALMIC
  Filled 2015-12-21 (×2): qty 3.5

## 2015-12-21 MED ORDER — PIPERACILLIN-TAZOBACTAM IN DEX 2-0.25 GM/50ML IV SOLN
2.2500 g | Freq: Three times a day (TID) | INTRAVENOUS | Status: DC
  Administered 2015-12-21 – 2015-12-22 (×3): 2.25 g via INTRAVENOUS
  Filled 2015-12-21 (×6): qty 50

## 2015-12-21 MED ORDER — METOPROLOL TARTRATE 12.5 MG HALF TABLET
12.5000 mg | ORAL_TABLET | Freq: Two times a day (BID) | ORAL | Status: DC
  Administered 2015-12-21 – 2015-12-23 (×5): 12.5 mg via ORAL
  Filled 2015-12-21 (×6): qty 1

## 2015-12-21 MED ORDER — DEXTROSE 5 % IV SOLN
2.0000 g | INTRAVENOUS | Status: AC
  Administered 2015-12-21: 2 g via INTRAVENOUS
  Filled 2015-12-21: qty 2

## 2015-12-21 MED ORDER — SIMVASTATIN 20 MG PO TABS
20.0000 mg | ORAL_TABLET | Freq: Every day | ORAL | Status: DC
  Administered 2015-12-21 – 2015-12-22 (×2): 20 mg via ORAL
  Filled 2015-12-21 (×2): qty 1

## 2015-12-21 MED ORDER — SODIUM CHLORIDE 0.9 % IV BOLUS (SEPSIS)
2000.0000 mL | Freq: Once | INTRAVENOUS | Status: AC
Start: 2015-12-21 — End: 2015-12-21
  Administered 2015-12-21: 2000 mL via INTRAVENOUS

## 2015-12-21 MED ORDER — POTASSIUM CHLORIDE 20 MEQ/15ML (10%) PO SOLN
20.0000 meq | Freq: Once | ORAL | Status: AC
  Administered 2015-12-21: 20 meq via ORAL
  Filled 2015-12-21: qty 15

## 2015-12-21 MED ORDER — TAMSULOSIN HCL 0.4 MG PO CAPS
0.4000 mg | ORAL_CAPSULE | Freq: Every day | ORAL | Status: DC
  Administered 2015-12-21 – 2015-12-23 (×3): 0.4 mg via ORAL
  Filled 2015-12-21 (×3): qty 1

## 2015-12-21 MED ORDER — SODIUM CHLORIDE 0.9 % IV BOLUS (SEPSIS)
250.0000 mL | Freq: Once | INTRAVENOUS | Status: AC
  Administered 2015-12-21: 250 mL via INTRAVENOUS

## 2015-12-21 MED ORDER — IPRATROPIUM BROMIDE 0.02 % IN SOLN
0.5000 mg | Freq: Two times a day (BID) | RESPIRATORY_TRACT | Status: DC
  Administered 2015-12-21 – 2015-12-22 (×3): 0.5 mg via RESPIRATORY_TRACT
  Filled 2015-12-21 (×2): qty 2.5

## 2015-12-21 MED ORDER — IPRATROPIUM BROMIDE 0.02 % IN SOLN
RESPIRATORY_TRACT | Status: AC
  Filled 2015-12-21: qty 2.5

## 2015-12-21 MED ORDER — ASPIRIN EC 81 MG PO TBEC
81.0000 mg | DELAYED_RELEASE_TABLET | Freq: Every day | ORAL | Status: DC
  Administered 2015-12-21 – 2015-12-23 (×3): 81 mg via ORAL
  Filled 2015-12-21 (×3): qty 1

## 2015-12-21 MED ORDER — METHYLPREDNISOLONE SODIUM SUCC 125 MG IJ SOLR: 60.0000 mg | Freq: Two times a day (BID) | INTRAMUSCULAR | Status: DC

## 2015-12-21 MED ORDER — DM-GUAIFENESIN ER 30-600 MG PO TB12
1.0000 | ORAL_TABLET | Freq: Two times a day (BID) | ORAL | Status: DC
  Administered 2015-12-21 – 2015-12-23 (×5): 1 via ORAL
  Filled 2015-12-21 (×6): qty 1

## 2015-12-21 MED ORDER — HYDROCODONE-ACETAMINOPHEN 5-325 MG PO TABS: 0.5000 | ORAL_TABLET | Freq: Four times a day (QID) | ORAL | Status: DC | PRN

## 2015-12-21 MED ORDER — VANCOMYCIN HCL IN DEXTROSE 1-5 GM/200ML-% IV SOLN
1000.0000 mg | Freq: Once | INTRAVENOUS | Status: AC
  Administered 2015-12-21: 1000 mg via INTRAVENOUS
  Filled 2015-12-21: qty 200

## 2015-12-21 MED ORDER — VANCOMYCIN HCL IN DEXTROSE 750-5 MG/150ML-% IV SOLN: 750.0000 mg | INTRAVENOUS | Status: DC

## 2015-12-21 MED ORDER — METHYLPREDNISOLONE SODIUM SUCC 125 MG IJ SOLR
125.0000 mg | Freq: Once | INTRAMUSCULAR | Status: AC
  Administered 2015-12-21: 125 mg via INTRAVENOUS
  Filled 2015-12-21: qty 2

## 2015-12-21 MED ORDER — ALBUTEROL SULFATE (2.5 MG/3ML) 0.083% IN NEBU: 2.5000 mg | INHALATION_SOLUTION | RESPIRATORY_TRACT | Status: DC | PRN

## 2015-12-21 NOTE — Evaluation (Signed)
Physical Therapy Evaluation Patient Details Name: [name] MRN: [id number] DOB: 1925/05/04 Today's Date: 12/21/2015   History of Present Illness  Pt adm with Acute hypoxic resp failure and Sepsis due to RLL PNA. PMH - CHF, HTN, respiratory failure, T12 fx, CVA, macular degeneration.  Clinical Impression  Pt admitted with above diagnosis and presents to PT with functional limitations due to deficits listed below (See PT problem list). Pt needs skilled PT to maximize independence and safety to allow discharge to SNF unless family able to continue to provide 24 hour care.      Follow Up Recommendations SNF (Unless pt's family can continue to provide 24 hour assistanc)    Equipment Recommendations  None recommended by PT    Recommendations for Other Services       Precautions / Restrictions Precautions Precautions: Fall Restrictions Weight Bearing Restrictions: No      Mobility  Bed Mobility Overal bed mobility: Needs Assistance Bed Mobility: Sidelying to Sit;Sit to Sidelying   Sidelying to sit: Mod assist     Sit to sidelying: Mod assist General bed mobility comments: Assist to elevate trunk into sitting and assist to bring legs back up into bed returning to sidelying.  Transfers Overall transfer level: Needs assistance Equipment used: 1 person hand held assist (bilateral forearm support) Transfers: Sit to/from Stand Sit to Stand: Max assist         General transfer comment: Assist to bring hips up but pt unable to come into full extension. Pt with posterior legs braced against bed.   Ambulation/Gait                Stairs            Wheelchair Mobility    Modified Rankin (Stroke Patients Only)       Balance Overall balance assessment: Needs assistance Sitting-balance support: No upper extremity supported;Feet supported Sitting balance-Leahy Scale: Fair     Standing balance support: Bilateral upper extremity supported Standing  balance-Leahy Scale: Zero                               Pertinent Vitals/Pain Pain Assessment: No/denies pain    Home Living Family/patient expects to be discharged to:: Private residence Living Arrangements: Other relatives ([name] has been staying with pt. Wife just died last night) Available Help at Discharge: Family;Available 24 hours/day Type of Home: House Home Access: Stairs to enter Entrance Stairs-Rails: Right Entrance Stairs-Number of Steps: 4 Home Layout: One level Home Equipment: Walker - 2 wheels;Cane - single point;Bedside commode;Shower seat;Wheelchair - manual Additional Comments: Pt's wife died last night.    Prior Function Level of Independence: Needs assistance   Gait / Transfers Assistance Needed: Has only been amb very short distances with walker and with assist           Hand Dominance   Dominant Hand: Right    Extremity/Trunk Assessment   Upper Extremity Assessment: Defer to OT evaluation           Lower Extremity Assessment: RLE deficits/detail;LLE deficits/detail RLE Deficits / Details: grossly 3-/5 LLE Deficits / Details: grossly 3-/5     Communication   Communication: HOH  Cognition Arousal/Alertness: Awake/alert Behavior During Therapy: WFL for tasks assessed/performed Overall Cognitive Status: History of cognitive impairments - at baseline       Memory: Decreased short-term memory              General Comments  Exercises        Assessment/Plan    PT Assessment Patient needs continued PT services  PT Diagnosis Difficulty walking;Generalized weakness   PT Problem List Decreased strength;Decreased activity tolerance;Decreased balance;Decreased mobility  PT Treatment Interventions DME instruction;Gait training;Functional mobility training;Therapeutic activities;Therapeutic exercise;Balance training;Patient/family education   PT Goals (Current goals can be found in the Care Plan section) Acute Rehab  PT Goals Patient Stated Goal: Not stated PT Goal Formulation: With patient Time For Goal Achievement: 01/04/16 Potential to Achieve Goals: Good    Frequency Min 3X/week   Barriers to discharge Decreased caregiver support Wife just died    Co-evaluation               End of Session Equipment Utilized During Treatment: Gait belt;Oxygen Activity Tolerance: Patient limited by fatigue Patient left: in bed;with call bell/phone within reach;with family/visitor present;with nursing/sitter in room Nurse Communication: Mobility status         Time: [id number] PT Time Calculation (min) (ACUTE ONLY): 18 min   Charges:   PT Evaluation $PT Eval Moderate Complexity: 1 Procedure     PT G Codes:        Judson Tsan 12-28-15, 1:47 PM [facility] PT [phone]

## 2015-12-21 NOTE — Progress Notes (Signed)
CRITICAL VALUE ALERT  Critical value received:  Lactic acid 2.3  Date of notification:  12/21/15  Time of notification:  0750  Critical value read back:Yes.    Nurse who received alert:  Dorena Cookey RN  MD notified (1st page):  McClung  Time of first page:  0845  MD notified (2nd page):  Time of second page:  Responding MD:  Thereasa Solo  Time MD responded:  [phone]

## 2015-12-21 NOTE — Progress Notes (Signed)
[facility] TEAM 1 - Stepdown/ICU TEAM PROGRESS NOTE  [name] [id number] DOB: 04-Jun-1925 DOA: 12/21/2015 PCP: [name], MD  Admit HPI / Brief Narrative: 82 y.o. male with history of prior tobacco abuse, HTN, HLD, diastolic CHF, TIA, stroke, esophageal stricture, macular degeneration, CKD stage II, BPH, GERD, and possible COPD who presented with cough, fever and shortness of breath.  Patient was hospitalized 3/16 > 11/16/15 for a CHF exacerbation.  In the ED the pt was found to have troponin 0.14, BNP 496.3, WBC 19.0, temperature 101, alternative bradycardia-tachycardia, tachypnea, oxygen saturation 89%, potassium 3.3, worsening renal function with creatinine 2.2. CXR showed right lower lobe infiltration.   HPI/Subjective: Pt seen for f/u visit.  Assessment/Plan:  Acute hypoxic resp failure and Sepsis due to RLL PNA  Elevated troponin (mild) most likely demand ischemia   Hypokalemia  MAT  Abdominal pain  mild right upper quadrant abdominal pain, just below the right rib cage  Chronic diastolic CHF  TTE [id number] EF of 55-60% with grade 1 DD - d/c weight 3/22 59.5kg  Filed Weights   12/21/15 0328  Weight: 59.875 kg (132 lb)    HLD  Hx of Esophageal dysmotility  Hx of TIA -on ASA and Zocor  Hx of Compression fracture of T12 vertebra -prn Norco and tramdol  BPH -Continue Flomax  Acute on CKD Stage 2 cr 1.0 12/17/15  Recent Labs Lab 12/17/15 1322 12/21/15 0355  CREATININE 1.00 2.20*    Code Status: FULL Family Communication: no family present at time of exam Disposition Plan: SDU  Consultants: none  Procedures: none  Antibiotics: Cefepime 4/23 > Vanc 4/23 >  DVT prophylaxis: SQ heparin   Objective: Blood pressure 125/113, pulse 133, temperature 99.7 F (37.6 C), temperature source Axillary, resp. rate 21, height 5\' 6"  (1.676 m), weight 59.875 kg (132 lb), SpO2 100 %.  Intake/Output Summary (Last 24 hours) at 12/21/15 0841 Last  data filed at 12/21/15 0800  Gross per 24 hour  Intake 2052.5 ml  Output      0 ml  Net 2052.5 ml   Exam: Pt seen for f/u visit.  Data Reviewed: Basic Metabolic Panel:  Recent Labs Lab 12/17/15 1322 12/21/15 0355  NA 133* 130*  K 3.7 3.3*  CL 90* 86*  CO2 37* 27  GLUCOSE 115* 116*  BUN 18 66*  CREATININE 1.00 2.20*  CALCIUM 9.3 8.0*    CBC:  Recent Labs Lab 12/17/15 1322 12/21/15 0355  WBC 9.3 19.0*  NEUTROABS 6.8 16.9*  HGB 12.1* 11.9*  HCT 37.0* 35.6*  MCV 80.6 78.6  PLT 324.0 264    Liver Function Tests:  Recent Labs Lab 12/17/15 1322 12/21/15 0355  AST 17 57*  ALT 15 43  ALKPHOS 122* 107  BILITOT 0.4 0.8  PROT 7.4 6.9  ALBUMIN 3.4* 2.3*    Recent Labs Lab 12/21/15 0637  LIPASE 15    Coags:  Recent Labs Lab 12/21/15 0637  INR 1.35    Recent Labs Lab 12/21/15 0637  APTT 36    Cardiac Enzymes:  Recent Labs Lab 12/21/15 0355 12/21/15 0637  TROPONINI 0.14* 0.12*    CBG:  Recent Labs Lab 12/21/15 0726  GLUCAP 130*    Studies:   Recent x-ray studies have been reviewed in detail by the Attending Physician  Scheduled Meds:  Scheduled Meds: . aspirin EC  81 mg Oral Daily  . dextromethorphan-guaiFENesin  1 tablet Oral BID  . heparin  5,000 Units Subcutaneous Q8H  . ipratropium      .  ipratropium  0.5 mg Nebulization BID  . levalbuterol  1.25 mg Nebulization BID  . methylPREDNISolone (SOLU-MEDROL) injection  60 mg Intravenous Q12H  . metoprolol tartrate  12.5 mg Oral BID  . neomycin-polymyxin b-dexamethasone  1 application Both Eyes BID  . oxybutynin  5 mg Oral QHS  . piperacillin-tazobactam (ZOSYN)  IV  2.25 g Intravenous Q8H  . simvastatin  20 mg Oral q1800  . tamsulosin  0.4 mg Oral Daily  . tobramycin  1 drop Both Eyes BID  . [START ON 12/23/2015] vancomycin  750 mg Intravenous Q48H    Time spent on care of this patient: No charge   Cherene Altes , MD   [facility] Office   [phone] Pager - Text Page per Amion as per below:  On-Call/Text Page:      [email or web address]      password [email or web address]  If 7PM-7AM, please contact night-coverage [email or web address] Password [email or web address] 12/21/2015, 8:41 AM   LOS: 0 days

## 2015-12-21 NOTE — ED Provider Notes (Signed)
CSN: [id number]     Arrival date & time 12/21/15  0320 History  By signing my name below, I, [name], attest that this documentation has been prepared under the direction and in the presence of [name], MD . Electronically Signed: [name], Scribe. 12/21/2015. 3:32 AM.    Chief Complaint  Patient presents with  . Shortness of Breath   LEVEL 5 CAVEAT DUE TO RESPIRATORY DISTRESS   The history is provided by the EMS personnel and the patient. No language interpreter was used.    HPI Comments:  BEECHER Aguilar is a 82 y.o. male with a history of PNA, and CHF, who presents to the Emergency Department complaining of SOB x 4 days. EMS reports associated cough x 3 days with sputum production. Pt was recently discharged after CHF exacerbation. He  was given 2 breathing treatments en route with little relief. He denies CP/pressure/tightness, vomiting, diarrhea, fever, and diaphoresis.  Past Medical History  Diagnosis Date  . Hypertension   . Hyperlipidemia   . Tobacco abuse   . Lung nodule     Left lung, seen 2012, no change in 2012, no follow up needed   . PNA (pneumonia) 2010    Bilateral Pneumonia, COPD 56mm LLL Nodule   . History of ETT 09/1991     POS   . History of MRI 04-22-06    L/S- right hnp  l5/si   . B12 deficiency   . TIA (transient ischemic attack)   . GERD (gastroesophageal reflux disease)   . Esophageal stricture   . Hemorrhoids   . Colon polyps     hyperplastic  . Pneumonia   . Macular degeneration   . Stroke [facility])    Past Surgical History  Procedure Laterality Date  . Cystectomy  1995    Lumbar area   . Hip pinning,cannulated Left 03/28/2015    Procedure: CANNULATED HIP PINNING;  Surgeon: [name], MD;  Location: [facility] ORS;  Service: Orthopedics;  Laterality: Left;   Family History  Problem Relation Age of Onset  . Hypertension Mother   . Heart attack Father   . Colon cancer Neg Hx   . Esophageal cancer Neg Hx    Social History  Substance Use  Topics  . Smoking status: Former Smoker    Types: Cigarettes    Quit date: 08/29/1985  . Smokeless tobacco: Former Systems developer    Types: Chew  . Alcohol Use: No    Review of Systems  Unable to perform ROS: Acuity of condition    Allergies  Review of patient's allergies indicates no known allergies.  Home Medications   Prior to Admission medications   Medication Sig Start Date End Date Taking? Authorizing Provider  albuterol-ipratropium (COMBIVENT) 18-103 MCG/ACT inhaler Inhale 1 puff into the lungs 2 (two) times daily.    Historical Provider, MD  furosemide (LASIX) 20 MG tablet Take 1 tablet (20 mg total) by mouth daily. 11/19/15   Orson Eva, MD  HYDROcodone-acetaminophen (NORCO/VICODIN) 5-325 MG tablet Take 0.5-1 tablets by mouth every 6 (six) hours as needed for severe pain (if not better with tramadol.  sedation caution). 12/17/15   Tonia Ghent, MD  metoprolol tartrate (LOPRESSOR) 25 MG tablet Take 0.5 tablets (12.5 mg total) by mouth 2 (two) times daily. 11/16/15   Orson Eva, MD  neomycin-polymyxin b-dexamethasone (MAXITROL) 3.5-10000-0.1 OINT Place 1 application into both eyes 2 (two) times daily. 10/22/15   Historical Provider, MD  oxybutynin (DITROPAN-XL) 5 MG 24 hr tablet  Take 1 tablet (5 mg total) by mouth at bedtime. Reported on 12/17/2015 12/17/15   Tonia Ghent, MD  simvastatin (ZOCOR) 20 MG tablet Take 1 tablet (20 mg total) by mouth daily. 10/15/15   Tonia Ghent, MD  tamsulosin (FLOMAX) 0.4 MG CAPS capsule Take 1 capsule (0.4 mg total) by mouth daily. 10/20/15   Tonia Ghent, MD  tobramycin (TOBREX) 0.3 % ophthalmic solution Place 1 drop into both eyes 2 (two) times daily.  02/27/15   Historical Provider, MD  traMADol (ULTRAM) 50 MG tablet Take 1 tablet (50 mg total) by mouth every 12 (twelve) hours as needed. 12/17/15   Tonia Ghent, MD   Pulse 136  Resp 28  Ht 5\' 6"  (1.676 m)  Wt 132 lb (59.875 kg)  BMI 21.32 kg/m2  SpO2 100% Physical Exam  Constitutional: He is  oriented to person, place, and time. He appears well-developed and well-nourished.  HENT:  Head: Normocephalic and atraumatic.  Eyes: Conjunctivae and EOM are normal. Pupils are equal, round, and reactive to light.  Neck: Normal range of motion. Neck supple. No JVD present.  Cardiovascular: Normal rate, regular rhythm and normal heart sounds.   No murmur heard. Pulmonary/Chest: Accessory muscle usage present. He has rhonchi. He has rales. He exhibits no tenderness.  Using accesory muscles Course inspiratory and expiratory rhonchi diffusely Fine rales at right base   Abdominal: Soft. Bowel sounds are normal. He exhibits no distension and no mass. There is no tenderness.  Musculoskeletal: Normal range of motion. He exhibits no edema.  Lymphadenopathy:    He has no cervical adenopathy.  Neurological: He is alert and oriented to person, place, and time. No cranial nerve deficit. He exhibits normal muscle tone. Coordination normal.  Skin: Skin is warm and dry. No rash noted.  Psychiatric: He has a normal mood and affect.  Nursing note and vitals reviewed.   ED Course  Procedures   DIAGNOSTIC STUDIES:  Oxygen Saturation is 96% on BiPAP , normal by my interpretation.     Labs Review Results for orders placed or performed during the hospital encounter of 12/21/15  Troponin I  Result Value Ref Range   Troponin I 0.14 (H) <0.031 ng/mL  Brain natriuretic peptide  Result Value Ref Range   B Natriuretic Peptide 496.3 (H) 0.0 - 100.0 pg/mL  Comprehensive metabolic panel  Result Value Ref Range   Sodium 130 (L) 135 - 145 mmol/L   Potassium 3.3 (L) 3.5 - 5.1 mmol/L   Chloride 86 (L) 101 - 111 mmol/L   CO2 27 22 - 32 mmol/L   Glucose, Bld 116 (H) 65 - 99 mg/dL   BUN 66 (H) 6 - 20 mg/dL   Creatinine, Ser 2.20 (H) 0.61 - 1.24 mg/dL   Calcium 8.0 (L) 8.9 - 10.3 mg/dL   Total Protein 6.9 6.5 - 8.1 g/dL   Albumin 2.3 (L) 3.5 - 5.0 g/dL   AST 57 (H) 15 - 41 U/L   ALT 43 17 - 63 U/L    Alkaline Phosphatase 107 38 - 126 U/L   Total Bilirubin 0.8 0.3 - 1.2 mg/dL   GFR calc non Af Amer 25 (L) >60 mL/min   GFR calc Af Amer 29 (L) >60 mL/min   Anion gap 17 (H) 5 - 15  CBC with Differential  Result Value Ref Range   WBC 19.0 (H) 4.0 - 10.5 K/uL   RBC 4.53 4.22 - 5.81 MIL/uL   Hemoglobin 11.9 (L) 13.0 -  17.0 g/dL   HCT 35.6 (L) 39.0 - 52.0 %   MCV 78.6 78.0 - 100.0 fL   MCH 26.3 26.0 - 34.0 pg   MCHC 33.4 30.0 - 36.0 g/dL   RDW 15.1 11.5 - 15.5 %   Platelets 264 150 - 400 K/uL   Neutrophils Relative % 88 %   Neutro Abs 16.9 (H) 1.7 - 7.7 K/uL   Lymphocytes Relative 6 %   Lymphs Abs 1.0 0.7 - 4.0 K/uL   Monocytes Relative 6 %   Monocytes Absolute 1.1 (H) 0.1 - 1.0 K/uL   Eosinophils Relative 0 %   Eosinophils Absolute 0.0 0.0 - 0.7 K/uL   Basophils Relative 0 %   Basophils Absolute 0.0 0.0 - 0.1 K/uL  I-Stat CG4 Lactic Acid, ED  Result Value Ref Range   Lactic Acid, Venous 3.06 (HH) 0.5 - 2.0 mmol/L   Comment NOTIFIED PHYSICIAN    Imaging Review Dg Chest Port 1 View  12/21/2015  CLINICAL DATA:  Productive cough for 3 days.  Dyspnea EXAM: PORTABLE CHEST 1 VIEW COMPARISON:  11/12/2015 FINDINGS: Dense airspace disease at the right base consistent with pneumonia. Background COPD with emphysema. No cardiomegaly when accounting for apical fat pad and rotation. No effusion or pneumothorax. IMPRESSION: 1. Right lower lobe pneumonia. 2. Emphysema. Electronically Signed   By: Monte Fantasia M.D.   On: 12/21/2015 04:18   I have personally reviewed and evaluated these images and lab results as part of my medical decision-making.   EKG Interpretation   Date/Time:  Monday December 21 2015 03:45:25 EDT Ventricular Rate:  162 PR Interval:  127 QRS Duration: 85 QT Interval:  287 QTC Calculation: 471 R Axis:   -18 Text Interpretation:  Multifocal atrial tachycardia Multiple premature  complexes, vent & supraven Borderline left axis deviation Repolarization  abnormality, prob  rate related When compared with ECG of 11/13/2015,  Multifocal atrial tachycardia is now Present Confirmed by [facility]  MD, Briunna Leicht  ([id number]) on 12/21/2015 3:53:51 AM      CRITICAL CARE Performed by: [id number] Total critical care time: 50 minutes Critical care time was exclusive of separately billable procedures and treating other patients. Critical care was necessary to treat or prevent imminent or life-threatening deterioration. Critical care was time spent personally by me on the following activities: development of treatment plan with patient and/or surrogate as well as nursing, discussions with consultants, evaluation of patient's response to treatment, examination of patient, obtaining history from patient or surrogate, ordering and performing treatments and interventions, ordering and review of laboratory studies, ordering and review of radiographic studies, pulse oximetry and re-evaluation of patient's condition.\ MDM   Final diagnoses:  HCAP (healthcare-associated pneumonia)  Renal insufficiency  Elevated troponin I level  Elevated lactic acid level    Acute respiratory distress. Old records are reviewed and he has history of COPD and CHF as well as pneumonia. Diagnostic possibilities include congestive heart failure exacerbation, COPD exacerbation, pneumonia. Chest x-rays obtained which showed that show evidence of right-sided pneumonia. Because of recent hospitalization comment Misty's be treated as healthcare associated pneumonia. He is started on vancomycin and cefepime. Lactic acid level is elevated so he is started on early goal-directed fluid therapy. Because of concerns of COPD, he was given initial dose of methylprednisolone. He seems to be tolerating this well with no evidence of pulmonary edema. At no point was the patient hypotensive. He has noted to have renal insufficiency which is generally in line with recent fusion creatinines. There was a  creatinine of 1.0 done 4 days ago,  but it is not in line with his other lab values and I do not believe it is accurate. Case is discussed with Dr. Blaine Hamper of [facility] who agrees to admit the patient to stepdown unit.  I personally performed the services described in this documentation, which was scribed in my presence. The recorded information has been reviewed and is accurate.      [name], MD [phone] [id number]

## 2015-12-21 NOTE — H&P (Signed)
History and Physical    [name] [id number] DOB: May 26, 1925 DOA: 12/21/2015  Referring MD/NP/PA:  PCP: Chase Stain, MD   Outpatient Specialists: none Patient coming from:  Home   Chief Complaint: Cough, fever, shortness breath  HPI: [name] is a 82 y.o. male with medical history significant of formal smoker, hypertension, hyperlipidemia, diastolic congestive heart failure, TIA, stroke, esophageal stricture, macular degeneration, chronic kidney disease-stage II, BPH, GERD, possible COPD, who presents with cough, fever and shortness of breath.  Patient reports that he has been having shortness of breath, fever and cough in the past 3-4 days. He coughs up yellowish colored sputum. He does not have chest pain, but has mild abdominal pain over right upper quadrant, just below the R rib cage. He does not have a runny nose or sore throat. He does not have nausea, vomiting or diarrhea. No symptoms of UTI. Patient does not have unilateral weakness. Patient was recently hospitalized from 3/16-->11/16/15 and treated for CHF exacerbation.  ED Course: pt was found to have lengthy to 3.06, troponin 0.14, BNP 496.3, WBC 19.0, temperature 101, alternative bradycardia-tachycardia, tachypnea, oxygen saturation 89%, potassium 3.3, worsening renal function with creatinine 2.2. CXR showed right lower lobe infiltration. Patient is admitted to inpatient for further eval and treatment.   Can patient participate in ADLs? little   Review of Systems:   General: has evers, chills, no changes in body weight, has poor appetite, has fatigue HEENT: no blurry vision, hearing changes or sore throat Pulm: has dyspnea, coughing, wheezing CV: no chest pain, no palpitations Abd: no nausea, vomiting, has mild abdominal pain, no diarrhea, constipation GU: no dysuria, burning on urination, increased urinary frequency, hematuria  Ext: trace leg edema Neuro: no unilateral weakness, numbness, or tingling, no  vision change or hearing loss Skin: no rash MSK: No muscle spasm, no deformity, no limitation of range of movement in spin Heme: No easy bruising.  Travel history: No recent long distant travel.  Allergy: No Known Allergies  Past Medical History  Diagnosis Date  . Hypertension   . Hyperlipidemia   . Tobacco abuse   . Lung nodule     Left lung, seen 2012, no change in 2012, no follow up needed   . PNA (pneumonia) 2010    Bilateral Pneumonia, COPD 23mm LLL Nodule   . History of ETT 09/1991     POS   . History of MRI 04-22-06    L/S- right hnp  l5/si   . B12 deficiency   . TIA (transient ischemic attack)   . GERD (gastroesophageal reflux disease)   . Esophageal stricture   . Hemorrhoids   . Colon polyps     hyperplastic  . Pneumonia   . Macular degeneration   . Stroke ([place])   . CKD (chronic kidney disease), stage II     Past Surgical History  Procedure Laterality Date  . Cystectomy  1995    Lumbar area   . Hip pinning,cannulated Left 03/28/2015    Procedure: CANNULATED HIP PINNING;  Surgeon: [name], MD;  Location: [facility];  Service: Orthopedics;  Laterality: Left;    Social History:  reports that he quit smoking about 30 years ago. His smoking use included Cigarettes. He has quit using smokeless tobacco. His smokeless tobacco use included Chew. He reports that he does not drink alcohol or use illicit drugs.  Family History:  Family History  Problem Relation Age of Onset  . Hypertension Mother   . Heart attack  Father   . Colon cancer Neg Hx   . Esophageal cancer Neg Hx      Prior to Admission medications   Medication Sig Start Date End Date Taking? Authorizing Provider  albuterol-ipratropium (COMBIVENT) 18-103 MCG/ACT inhaler Inhale 1 puff into the lungs 2 (two) times daily.    Historical Provider, MD  furosemide (LASIX) 20 MG tablet Take 1 tablet (20 mg total) by mouth daily. 11/19/15   Chase Eva, MD  HYDROcodone-acetaminophen (NORCO/VICODIN) 5-325 MG tablet Take  0.5-1 tablets by mouth every 6 (six) hours as needed for severe pain (if not better with tramadol.  sedation caution). 12/17/15   Tonia Ghent, MD  metoprolol tartrate (LOPRESSOR) 25 MG tablet Take 0.5 tablets (12.5 mg total) by mouth 2 (two) times daily. 11/16/15   Chase Eva, MD  neomycin-polymyxin b-dexamethasone (MAXITROL) 3.5-10000-0.1 OINT Place 1 application into both eyes 2 (two) times daily. 10/22/15   Historical Provider, MD  oxybutynin (DITROPAN-XL) 5 MG 24 hr tablet Take 1 tablet (5 mg total) by mouth at bedtime. Reported on 12/17/2015 12/17/15   Tonia Ghent, MD  simvastatin (ZOCOR) 20 MG tablet Take 1 tablet (20 mg total) by mouth daily. 10/15/15   Tonia Ghent, MD  tamsulosin (FLOMAX) 0.4 MG CAPS capsule Take 1 capsule (0.4 mg total) by mouth daily. 10/20/15   Tonia Ghent, MD  tobramycin (TOBREX) 0.3 % ophthalmic solution Place 1 drop into both eyes 2 (two) times daily.  02/27/15   Historical Provider, MD  traMADol (ULTRAM) 50 MG tablet Take 1 tablet (50 mg total) by mouth every 12 (twelve) hours as needed. 12/17/15   Tonia Ghent, MD    Physical Exam: Filed Vitals:   12/21/15 0530 12/21/15 0545 12/21/15 0600 12/21/15 0615  BP: 111/49 118/65 117/71 118/61  Pulse: 107 136 98 99  Temp:      TempSrc:      Resp: 20 21 23 18   Height:      Weight:      SpO2: 100% 100% 100% 100%   General: Not in acute distress HEENT:       Eyes: PERRL, EOMI, no scleral icterus.       ENT: No discharge from the ears and nose, no pharynx injection, no tonsillar enlargement.        Neck: No JVD, no bruit, no mass felt. Heme: No neck lymph node enlargement. Cardiac: S1/S2, Irregularly irregular rhythm, No murmurs, No gallops or rubs. Pulm: has crackles and mild wheezing bilaterally.  Abd: Soft, nondistended, nontender, no rebound pain, no organomegaly, BS present. GU: No hematuria Ext: No pitting leg edema bilaterally. 2+DP/PT pulse bilaterally. Musculoskeletal: No joint deformities, No  joint redness or warmth, no limitation of ROM in spin. Skin: No rashes.  Neuro: Alert, oriented X3, cranial nerves II-XII grossly intact, moves all extremities normally.  Labs on Admission: I have personally reviewed following labs and imaging studies  CBC:  Recent Labs Lab 12/17/15 1322 12/21/15 0355  WBC 9.3 19.0*  NEUTROABS 6.8 16.9*  HGB 12.1* 11.9*  HCT 37.0* 35.6*  MCV 80.6 78.6  PLT 324.0 [id number]   Basic Metabolic Panel:  Recent Labs Lab 12/17/15 1322 12/21/15 0355  NA 133* 130*  K 3.7 3.3*  CL 90* 86*  CO2 37* 27  GLUCOSE 115* 116*  BUN 18 66*  CREATININE 1.00 2.20*  CALCIUM 9.3 8.0*   GFR: Estimated Creatinine Clearance: 19.7 mL/min (by C-G formula based on Cr of 2.2). Liver Function Tests:  Recent Labs  Lab 12/17/15 1322 12/21/15 0355  AST 17 57*  ALT 15 43  ALKPHOS 122* 107  BILITOT 0.4 0.8  PROT 7.4 6.9  ALBUMIN 3.4* 2.3*   No results for input(s): LIPASE, AMYLASE in the last 168 hours. No results for input(s): AMMONIA in the last 168 hours. Coagulation Profile: No results for input(s): INR, PROTIME in the last 168 hours. Cardiac Enzymes:  Recent Labs Lab 12/21/15 0355  TROPONINI 0.14*   BNP (last 3 results) No results for input(s): PROBNP in the last 8760 hours. HbA1C: No results for input(s): HGBA1C in the last 72 hours. CBG: No results for input(s): GLUCAP in the last 168 hours. Lipid Profile: No results for input(s): CHOL, HDL, LDLCALC, TRIG, CHOLHDL, LDLDIRECT in the last 72 hours. Thyroid Function Tests: No results for input(s): TSH, T4TOTAL, FREET4, T3FREE, THYROIDAB in the last 72 hours. Anemia Panel: No results for input(s): VITAMINB12, FOLATE, FERRITIN, TIBC, IRON, RETICCTPCT in the last 72 hours. Urine analysis:    Component Value Date/Time   COLORURINE YELLOW 11/13/2015 0635   APPEARANCEUR CLEAR 11/13/2015 0635   LABSPEC 1.010 11/13/2015 0635   PHURINE 6.0 11/13/2015 [place] 11/13/2015 0635   HGBUR  TRACE* 11/13/2015 0635   BILIRUBINUR NEGATIVE 11/13/2015 0635   KETONESUR NEGATIVE 11/13/2015 0635   PROTEINUR NEGATIVE 11/13/2015 0635   UROBILINOGEN 1.0 03/28/2015 0219   NITRITE NEGATIVE 11/13/2015 0635   LEUKOCYTESUR NEGATIVE 11/13/2015 0635   Sepsis Labs: @LABRCNTIP (procalcitonin:4,lacticidven:4) )No results found for this or any previous visit (from the past 240 hour(s)).   Radiological Exams on Admission: Dg Chest Port 1 View  12/21/2015  CLINICAL DATA:  Productive cough for 3 days.  Dyspnea EXAM: PORTABLE CHEST 1 VIEW COMPARISON:  11/12/2015 FINDINGS: Dense airspace disease at the right base consistent with pneumonia. Background COPD with emphysema. No cardiomegaly when accounting for apical fat pad and rotation. No effusion or pneumothorax. IMPRESSION: 1. Right lower lobe pneumonia. 2. Emphysema. Electronically Signed   By: Monte Fantasia M.D.   On: 12/21/2015 04:18     EKG: Independently reviewed. QTC 471, multifocal atrial tachycardia, ST depression in V4-V6  Assessment/Plan Principal Problem:   Acute respiratory failure with hypoxia (HCC) Active Problems:   HYPERCHOLESTEROLEMIA   Esophageal dysmotility   TIA (transient ischemic attack)   Compression fracture of T12 vertebra (HCC)   BPH (benign prostatic hyperplasia)   HTN (hypertension)   HLD (hyperlipidemia)   Chronic diastolic (congestive) heart failure (HCC)   Acute on chronic kidney failure-II   HCAP (healthcare-associated pneumonia)   Sepsis (HCC)   Abdominal pain   Multifocal atrial tachycardia (HCC)   Elevated troponin   Aspiration pneumonia (HCC)   Acute respiratory failure with hypoxia and sepsis: likely due to HCAP vs. aspiration pneumonia. Patient may have possible underlying COPD given on history of smoking in the past. He has mild wheezing on auscultation, indicating possible COPD exacerbation. Patient is septic with leukocytosis, fever, tachycardia, tachypnea and elevated lactate. Currently  hemodynamically stable.  -will admit patient to SDU since pt may need BiPAP -Nebulizers: scheduled Atroven and prn xopenex -Solu-Medrol 60 mg IV bid -IV vancomycin and Zosyn -Mucinex for cough  -Urine S. Pneumococcal and Legionella antigen -Follow up blood culture x2, sputum culture, respiratory virus panel, Flu pcr -Stat ABG -prn BiPAP -will get Procalcitonin and trend lactic acid levels per sepsis protocol. -IVF: 2L of NS bolus in ED, followed by 75 cc/h (patient has congestive heart failure, limiting aggressive IV fluids treatment).  Elevated trop: trop 0.14.  No CP, most likely due to demanding ischemia secondary to hypoxia and sepsis. EKG showed multifocal atrial tachycardia and a mild ST depression in V4-V6. -Troponin 3 -Aspirin -Continue Zocor and metoprolol  Multifocal atrial tachycardia: Most likely due to hypoxia -Treat underlying pneumonia and COPD -Telemetry monitor  Abdominal pain: Patient has a mild right upper quadrant abdominal pain, just below the right rib cage. May be related to coughing. -Check lipase -If gets worse, may consider ultrasound or CT abdomen/pelvis. -on Norco prn -Check A1c and FLP  Chronic diastolic (congestive) heart failure ([place]): 2-D echo on 11/14/15 showed EF of 55-60% with grade 1 diastolic dysfunction. BNP is slightly elevated at 496.3, blood patient does not have leg edema or JVD. CHF seems to be compensated. - Because of sepsis and worsening renal function, will hold Lasix. - Continue metoprolol - Newly started low-dose aspirin  HLD: Last LDL was 140 on 08/12/15 -Continue home medications: Zocor -Check FLP  Hx of Esophageal dysmotility: -SLP  TIA (transient ischemic attack): -on ASA and Zocor  Hx of Compression fracture of T12 vertebra ([place]): -prn Norco and tramdol  BPH: stable - Continue Flomax  AoCKD-II: Creatinine was 1.004/20/17. His creatinine is 2.20, BUN 66. Likely due to prerenal secondary to dehydration and  continuation of diruetics. - IVF as above - Check FeUrea - Follow up renal function by BMP - Avoid ACEI and NSAIDs - hold lasix  DVT ppx: SQ Heparin (if pt develops severe chest pain or significantly elevated trop, will be easier to switch to IV heparin or stop heparin for procedure than using Lovenox).  Code Status: Full code Family Communication: None at bed side. Disposition Plan:  Anticipate discharge back to previous home environment Consults called: None Admission status:  inpatient /SDU   Date of Service 12/21/2015    Ivor Costa [facility] Pager [phone]  If 7PM-7AM, please contact night-coverage [email or web address] Password [facility] 12/21/2015, 6:41 AM

## 2015-12-21 NOTE — Progress Notes (Addendum)
Pharmacy Antibiotic Note  [name] is a 82 y.o. male admitted on 12/21/2015 with pneumonia (cannot r/o asp). Recently discharged from hospital on 3/20 after treatment for CHF. Pharmacy has been consulted for Vancomycin and Zosyn dosing x 8 days.  Pt received Cefepime 2gm ~0600 and Vancomycin 1gm IV ~0400 in ED.  Plan: Zosyn 2.25gm IV q8h x 8 days total Vancomycin 750mg  IV q48h x 8 days total Will f/u micro data, renal function, and pt's clinical condition Vanc trough prn  Height: 5\' 6"  (167.6 cm) Weight: 132 lb (59.875 kg) IBW/kg (Calculated) : 63.8  Temp (24hrs), Avg:101 F (38.3 C), Min:101 F (38.3 C), Max:101 F (38.3 C)   Recent Labs Lab 12/17/15 1322 12/21/15 0355 12/21/15 0439  WBC 9.3 19.0*  --   CREATININE 1.00 2.20*  --   LATICACIDVEN  --   --  3.06*    Estimated Creatinine Clearance: 19.7 mL/min (by C-G formula based on Cr of 2.2).    No Known Allergies  Antimicrobials this admission: 4/24 Vanc >>  4/24 Zosyn>> 4/24 Cefepime x1  Dose adjustments this admission: n/a  Microbiology results: 4/24 BCx x2:  RSV:  Sputum:   Thank you for allowing pharmacy to be a part of this patient's care.  Sherlon Handing, PharmD, BCPS Clinical pharmacist, pager [phone] 12/21/2015 6:12 AM

## 2015-12-21 NOTE — Evaluation (Signed)
Clinical/Bedside Swallow Evaluation Patient Details  Name: [name] MRN: [id number] Date of Birth: 11/07/1924  Today's Date: 12/21/2015 Time: SLP Start Time (ACUTE ONLY): 0947 SLP Stop Time (ACUTE ONLY): 1008 SLP Time Calculation (min) (ACUTE ONLY): 21 min  Past Medical History:  Past Medical History  Diagnosis Date  . Hypertension   . Hyperlipidemia   . Tobacco abuse   . Lung nodule     Left lung, seen 2012, no change in 2012, no follow up needed   . PNA (pneumonia) 2010    Bilateral Pneumonia, COPD 52mm LLL Nodule   . History of ETT 09/1991     POS   . History of MRI 04-22-06    L/S- right hnp  l5/si   . B12 deficiency   . TIA (transient ischemic attack)   . GERD (gastroesophageal reflux disease)   . Esophageal stricture   . Hemorrhoids   . Colon polyps     hyperplastic  . Pneumonia   . Macular degeneration   . Stroke ([place])   . CKD (chronic kidney disease), stage II    Past Surgical History:  Past Surgical History  Procedure Laterality Date  . Cystectomy  1995    Lumbar area   . Hip pinning,cannulated Left 03/28/2015    Procedure: CANNULATED HIP PINNING;  Surgeon: [name], MD;  Location: [facility];  Service: Orthopedics;  Laterality: Left;   HPI:  82 y.o. male with history of prior tobacco abuse, HTN, HLD, diastolic CHF, TIA, stroke, esophageal stricture, macular degeneration, CKD stage II, BPH, GERD, and possible COPD who presented with cough, fever and shortness of breath. Patient was hospitalized 3/16 > 11/16/15 for a CHF exacerbation.CXR showed right lower lobe infiltration. Pt has had several swallow evaluations (no MBS) by SLP over the past 2 years, all of which have recommended softer solids and thin liquids due to suspected primary esophageal issues.   Assessment / Plan / Recommendation Clinical Impression  Pt has baseline congestion that does not clear with cued coughs. It does not appear to worsen across PO intake, and he does not have any overt  coughing, throat clearing, or changes in vocal quality. Mastication is prolonged with soft textures, and he does remain at risk for aspiration due to current respiratory status and h/o esophageal dysmotility. Recommend Dys 2 diet and thin liquids to reduce his risk given current functional status. Reviewed aspiration and esophageal precautions with pt and family.    Aspiration Risk  Mild aspiration risk;Moderate aspiration risk    Diet Recommendation Dysphagia 2 (Fine chop);Thin liquid   Liquid Administration via: Straw;Cup Medication Administration: Whole meds with puree Supervision: Patient able to self feed;Staff to assist with self feeding;Full supervision/cueing for compensatory strategies Compensations: Slow rate;Small sips/bites;Follow solids with liquid Postural Changes: Seated upright at 90 degrees;Remain upright for at least 30 minutes after po intake    Other  Recommendations Oral Care Recommendations: Oral care BID   Follow up Recommendations   (tba)    Frequency and Duration min 2x/week  2 weeks       Prognosis Prognosis for Safe Diet Advancement: Fair Barriers to Reach Goals: Other (Comment) (h/o esophageal dysmotility)      Swallow Study   General HPI: 82 y.o. male with history of prior tobacco abuse, HTN, HLD, diastolic CHF, TIA, stroke, esophageal stricture, macular degeneration, CKD stage II, BPH, GERD, and possible COPD who presented with cough, fever and shortness of breath. Patient was hospitalized 3/16 > 11/16/15 for a CHF exacerbation.CXR  showed right lower lobe infiltration. Pt has had several swallow evaluations (no MBS) by SLP over the past 2 years, all of which have recommended softer solids and thin liquids due to suspected primary esophageal issues. Type of Study: Bedside Swallow Evaluation Previous Swallow Assessment: see HPI Diet Prior to this Study: NPO Temperature Spikes Noted: Yes (101) Respiratory Status: Venti-mask History of Recent Intubation:  No Behavior/Cognition: Alert;Cooperative;Pleasant mood Oral Cavity Assessment: Within Functional Limits Oral Care Completed by SLP: No Self-Feeding Abilities: Needs assist Patient Positioning: Upright in bed Baseline Vocal Quality: Normal Volitional Cough: Strong    Oral/Motor/Sensory Function Overall Oral Motor/Sensory Function: Within functional limits   Ice Chips Ice chips: Not tested   Thin Liquid Thin Liquid: Impaired Presentation: Straw Pharyngeal  Phase Impairments: Suspected delayed Swallow;Multiple swallows    Nectar Thick Nectar Thick Liquid: Not tested   Honey Thick Honey Thick Liquid: Not tested   Puree Puree: Impaired Presentation: Spoon Pharyngeal Phase Impairments: Multiple swallows   Solid   GO   Solid: Impaired Oral Phase Impairments: Impaired mastication Oral Phase Functional Implications: Impaired mastication;Oral residue Pharyngeal Phase Impairments: Multiple swallows       Germain Osgood, M.A. CCC-SLP [phone]  Germain Osgood 12/21/2015,10:19 AM

## 2015-12-21 NOTE — ED Notes (Signed)
Brought by EMS from home for SOB for 4 days with productive cough for 3 days.  Discharged recently with CHF.  Albuterol neb X 2 enroute.

## 2015-12-22 ENCOUNTER — Inpatient Hospital Stay (HOSPITAL_COMMUNITY): Payer: Medicare Other

## 2015-12-22 DIAGNOSIS — N183 Chronic kidney disease, stage 3 unspecified: Secondary | ICD-10-CM | POA: Insufficient documentation

## 2015-12-22 DIAGNOSIS — Y95 Nosocomial condition: Secondary | ICD-10-CM

## 2015-12-22 DIAGNOSIS — J189 Pneumonia, unspecified organism: Secondary | ICD-10-CM | POA: Insufficient documentation

## 2015-12-22 DIAGNOSIS — J9621 Acute and chronic respiratory failure with hypoxia: Secondary | ICD-10-CM | POA: Insufficient documentation

## 2015-12-22 DIAGNOSIS — I5032 Chronic diastolic (congestive) heart failure: Secondary | ICD-10-CM | POA: Insufficient documentation

## 2015-12-22 DIAGNOSIS — N179 Acute kidney failure, unspecified: Secondary | ICD-10-CM | POA: Insufficient documentation

## 2015-12-22 DIAGNOSIS — A419 Sepsis, unspecified organism: Secondary | ICD-10-CM | POA: Insufficient documentation

## 2015-12-22 LAB — COMPREHENSIVE METABOLIC PANEL
ALT: 92 U/L — ABNORMAL HIGH (ref 17–63)
ANION GAP: 10 (ref 5–15)
AST: 149 U/L — ABNORMAL HIGH (ref 15–41)
Albumin: 1.8 g/dL — ABNORMAL LOW (ref 3.5–5.0)
Alkaline Phosphatase: 103 U/L (ref 38–126)
BILIRUBIN TOTAL: 1 mg/dL (ref 0.3–1.2)
BUN: 57 mg/dL — ABNORMAL HIGH (ref 6–20)
CALCIUM: 7.9 mg/dL — AB (ref 8.9–10.3)
CO2: 24 mmol/L (ref 22–32)
Chloride: 98 mmol/L — ABNORMAL LOW (ref 101–111)
Creatinine, Ser: 1.74 mg/dL — ABNORMAL HIGH (ref 0.61–1.24)
GFR, EST AFRICAN AMERICAN: 39 mL/min — AB (ref >60)
GFR, EST NON AFRICAN AMERICAN: 33 mL/min — AB (ref >60)
Glucose, Bld: 128 mg/dL — ABNORMAL HIGH (ref 65–99)
POTASSIUM: 3.9 mmol/L (ref 3.5–5.1)
Sodium: 132 mmol/L — ABNORMAL LOW (ref 135–145)
TOTAL PROTEIN: 5.6 g/dL — AB (ref 6.5–8.1)

## 2015-12-22 LAB — RESPIRATORY VIRUS PANEL
ADENOVIRUS: NEGATIVE
INFLUENZA B 1: NEGATIVE
Influenza A: NEGATIVE
METAPNEUMOVIRUS: NEGATIVE
Parainfluenza 1: NEGATIVE
Parainfluenza 2: NEGATIVE
Parainfluenza 3: NEGATIVE
RESPIRATORY SYNCYTIAL VIRUS A: NEGATIVE
RHINOVIRUS: NEGATIVE
Respiratory Syncytial Virus B: NEGATIVE

## 2015-12-22 LAB — CBC
HEMATOCRIT: 28.1 % — AB (ref 39.0–52.0)
HEMOGLOBIN: 9.3 g/dL — AB (ref 13.0–17.0)
MCH: 25.9 pg — ABNORMAL LOW (ref 26.0–34.0)
MCHC: 33.1 g/dL (ref 30.0–36.0)
MCV: 78.3 fL (ref 78.0–100.0)
Platelets: 192 10*3/uL (ref 150–400)
RBC: 3.59 MIL/uL — ABNORMAL LOW (ref 4.22–5.81)
RDW: 15.2 % (ref 11.5–15.5)
WBC: 10.6 10*3/uL — AB (ref 4.0–10.5)

## 2015-12-22 LAB — MAGNESIUM: Magnesium: 1.7 mg/dL (ref 1.7–2.4)

## 2015-12-22 MED ORDER — VANCOMYCIN HCL 500 MG IV SOLR
500.0000 mg | INTRAVENOUS | Status: DC
  Administered 2015-12-22: 500 mg via INTRAVENOUS
  Filled 2015-12-22 (×2): qty 500

## 2015-12-22 MED ORDER — LEVALBUTEROL HCL 1.25 MG/0.5ML IN NEBU
1.2500 mg | INHALATION_SOLUTION | Freq: Three times a day (TID) | RESPIRATORY_TRACT | Status: DC
Start: 2015-12-22 — End: 2015-12-23
  Administered 2015-12-22 – 2015-12-23 (×3): 1.25 mg via RESPIRATORY_TRACT
  Filled 2015-12-22 (×4): qty 0.5

## 2015-12-22 MED ORDER — PIPERACILLIN-TAZOBACTAM 3.375 G IVPB
3.3750 g | Freq: Three times a day (TID) | INTRAVENOUS | Status: DC
  Administered 2015-12-22 – 2015-12-23 (×3): 3.375 g via INTRAVENOUS
  Filled 2015-12-22 (×7): qty 50

## 2015-12-22 MED ORDER — ENSURE ENLIVE PO LIQD
237.0000 mL | Freq: Two times a day (BID) | ORAL | Status: DC
  Administered 2015-12-23: 237 mL via ORAL

## 2015-12-22 MED ORDER — METHYLPREDNISOLONE SODIUM SUCC 125 MG IJ SOLR
60.0000 mg | INTRAMUSCULAR | Status: DC
  Administered 2015-12-22: 60 mg via INTRAVENOUS
  Filled 2015-12-22: qty 2

## 2015-12-22 MED ORDER — VANCOMYCIN HCL 500 MG IV SOLR
500.0000 mg | INTRAVENOUS | Status: DC
  Filled 2015-12-22: qty 500

## 2015-12-22 MED ORDER — LEVALBUTEROL HCL 0.63 MG/3ML IN NEBU
0.6300 mg | INHALATION_SOLUTION | Freq: Four times a day (QID) | RESPIRATORY_TRACT | Status: DC | PRN
  Administered 2015-12-22: 0.63 mg via RESPIRATORY_TRACT

## 2015-12-22 MED ORDER — LEVALBUTEROL HCL 1.25 MG/0.5ML IN NEBU
1.2500 mg | INHALATION_SOLUTION | Freq: Three times a day (TID) | RESPIRATORY_TRACT | Status: DC
  Filled 2015-12-22: qty 0.5

## 2015-12-22 MED ORDER — IPRATROPIUM BROMIDE 0.02 % IN SOLN: 0.5000 mg | Freq: Three times a day (TID) | RESPIRATORY_TRACT | Status: DC

## 2015-12-22 MED ORDER — LEVALBUTEROL HCL 0.63 MG/3ML IN NEBU
INHALATION_SOLUTION | RESPIRATORY_TRACT | Status: AC
  Filled 2015-12-22: qty 3

## 2015-12-22 MED ORDER — IPRATROPIUM BROMIDE 0.02 % IN SOLN
0.5000 mg | Freq: Three times a day (TID) | RESPIRATORY_TRACT | Status: DC
  Administered 2015-12-22 – 2015-12-23 (×4): 0.5 mg via RESPIRATORY_TRACT
  Filled 2015-12-22 (×4): qty 2.5

## 2015-12-22 NOTE — Progress Notes (Addendum)
Pharmacy Antibiotic Note  [name] is a 82 y.o. male admitted on 12/21/2015 with pneumonia (cannot r/o asp). Recently discharged from hospital on 3/20 after treatment for CHF. Pharmacy has been consulted for Vancomycin and Zosyn dosing x 8 days.  Plan: Inc Zosyn to 3.375gm IV q8h (4hr infusion) x 8 days total Inc vancomycin to 500mg  IV q24h x 8 days total Will f/u micro data, renal function, and pt's clinical condition Vanc trough prn  Height: 5\' 6"  (167.6 cm) Weight: 132 lb (59.875 kg) IBW/kg (Calculated) : 63.8  Temp (24hrs), Avg:97.7 F (36.5 C), Min:97.1 F (36.2 C), Max:98.3 F (36.8 C)   Recent Labs Lab 12/17/15 1322 12/21/15 0355 12/21/15 0439 12/21/15 0637 12/22/15 0231  WBC 9.3 19.0*  --   --  10.6*  CREATININE 1.00 2.20*  --   --  1.74*  LATICACIDVEN  --   --  3.06* 2.3*  --     Estimated Creatinine Clearance: 24.9 mL/min (by C-G formula based on Cr of 1.74).    No Known Allergies  Antimicrobials this admission: 4/24 Vanc >>  4/24 Zosyn>> 4/24 Cefepime x1  Dose adjustments this admission: n/a  Microbiology results: 4/24 BCx x2:  RSV:  Sputum:   Thank you for allowing pharmacy to be a part of this patient's care.  Angela Burke, PharmD Pharmacy Resident Pager: [phone]  12/22/2015 8:36 AM

## 2015-12-22 NOTE — Evaluation (Signed)
Occupational Therapy Evaluation Patient Details Name: [name] MRN: [id number] DOB: 24-Aug-1925 Today's Date: 12/22/2015    History of Present Illness Pt adm with Acute hypoxic resp failure and Sepsis due to RLL PNA. PMH - CHF, HTN, respiratory failure, T12 fx, CVA, macular degeneration.   Clinical Impression   Pt is not an accurate historian. Presents with generalized weakness, decreased activity tolerance, poor balance and impaired cognition interfering with ability to perform ADL and ADL transfers. Pt limiting activity today to EOB. Fearful of falling. Will follow acutely.    Follow Up Recommendations  SNF;Supervision/Assistance - 24 hour    Equipment Recommendations       Recommendations for Other Services       Precautions / Restrictions Precautions Precautions: Fall Restrictions Weight Bearing Restrictions: No      Mobility Bed Mobility Overal bed mobility: Needs Assistance Bed Mobility: Supine to Sit;Sit to Supine     Supine to sit: Mod assist Sit to supine: Mod assist   General bed mobility comments: Assist to elevate trunk into sitting and assist to bring legs back up into bed returning to sidelying.  Transfers                General transfer comment: pt refused    Balance     Sitting balance-Leahy Scale: Fair     Standing balance support: Single extremity supported                                ADL Overall ADL's : Needs assistance/impaired Eating/Feeding: Supervision/ safety;Sitting   Grooming: Wash/dry hands;Wash/dry face;Bed level;Minimal assistance   Upper Body Bathing: Maximal assistance;Sitting   Lower Body Bathing: Total assistance;Sit to/from stand   Upper Body Dressing : Moderate assistance;Sitting   Lower Body Dressing: Total assistance;Sit to/from stand                 General ADL Comments: Pt agreeable only to EOB.     Vision     Perception     Praxis      Pertinent Vitals/Pain Pain  Assessment: No/denies pain     Hand Dominance Right   Extremity/Trunk Assessment Upper Extremity Assessment Upper Extremity Assessment: Generalized weakness   Lower Extremity Assessment Lower Extremity Assessment: Defer to PT evaluation       Communication Communication Communication: HOH   Cognition Arousal/Alertness: Awake/alert Behavior During Therapy: WFL for tasks assessed/performed Overall Cognitive Status: History of cognitive impairments - at baseline       Memory: Decreased short-term memory             General Comments       Exercises       Shoulder Instructions      Home Living Family/patient expects to be discharged to:: Private residence Living Arrangements: Other relatives (grandson was staying with pt, wife recently died) Available Help at Discharge: Family Type of Home: House Home Access: Stairs to enter Technical brewer of Steps: 4 Entrance Stairs-Rails: Right Home Layout: One level     Bathroom Shower/Tub: Tub/shower unit;Walk-in shower   Bathroom Toilet: Handicapped height     Home Equipment: Environmental consultant - 2 wheels;Cane - single point;Bedside commode;Shower seat;Wheelchair - manual   Additional Comments: Wife was in hospice care, died earlier this week.      Prior Functioning/Environment Level of Independence: Needs assistance  Gait / Transfers Assistance Needed: Has only been amb very short distances with walker and with assist ADL's /  Homemaking Assistance Needed: reports being able to care for himself, wife did all IADL   Comments: No family available to confirm pt's report    OT Diagnosis: Generalized weakness;Cognitive deficits   OT Problem List: Decreased strength;Decreased activity tolerance;Impaired balance (sitting and/or standing);Decreased cognition;Decreased safety awareness;Decreased knowledge of use of DME or AE;Cardiopulmonary status limiting activity   OT Treatment/Interventions: Self-care/ADL training;DME  and/or AE instruction;Patient/family education;Cognitive remediation/compensation;Balance training;Therapeutic activities    OT Goals(Current goals can be found in the care plan section) Acute Rehab OT Goals Patient Stated Goal: Not stated OT Goal Formulation: Patient unable to participate in goal setting Time For Goal Achievement: 01/05/16 Potential to Achieve Goals: Fair ADL Goals Pt Will Perform Grooming: with supervision;sitting Pt Will Perform Upper Body Bathing: with min assist;sitting Pt Will Perform Upper Body Dressing: with min assist;sitting Pt Will Transfer to Toilet: with mod assist;stand pivot transfer;bedside commode Pt/caregiver will Perform Home Exercise Program: Increased strength;Both right and left upper extremity;With Supervision (AROM x 20 reps) Additional ADL Goal #1: Pt will perform bed mobility with supervision in preparation for ADL.  OT Frequency: Min 2X/week   Barriers to D/C:            Co-evaluation              End of Session Equipment Utilized During Treatment: Oxygen (3L) Nurse Communication: Mobility status  Activity Tolerance: Patient limited by fatigue Patient left: in bed;with call bell/phone within reach;with bed alarm set   Time: 1431-1456 OT Time Calculation (min): 25 min Charges:  OT General Charges $OT Visit: 1 Procedure OT Evaluation $OT Eval Moderate Complexity: 1 Procedure OT Treatments $Self Care/Home Management : 8-22 mins G-Codes:    Malka So 12/22/2015, 3:09 PM [phone]

## 2015-12-22 NOTE — Progress Notes (Signed)
[place] TEAM 1 - Stepdown/ICU TEAM Progress Note  [name] [id number] DOB: [birth year] DOA: 12/21/2015 PCP: [name], MD  Admit HPI / Brief Narrative: 82 y.o. WM PMHx Formal smoker, HTN, HLD, Chronic Diastolic CHF, TIA, Stroke, Esophageal Stricture, Macular degeneration, CKD stage II, BPH, GERD, possible COPD,   Who presents with cough, fever and shortness of breath.  Patient reports that he has been having shortness of breath, fever and cough in the past 3-4 days. He coughs up yellowish colored sputum. He does not have chest pain, but has mild abdominal pain over right upper quadrant, just below the R rib cage. He does not have a runny nose or sore throat. He does not have nausea, vomiting or diarrhea. No symptoms of UTI. Patient does not have unilateral weakness. Patient was recently hospitalized from 3/16-->11/16/15 and treated for CHF exacerbation.  ED Course: pt was found to have lengthy to 3.06, troponin 0.14, BNP 496.3, WBC 19.0, temperature 101, alternative bradycardia-tachycardia, tachypnea, oxygen saturation 89%, potassium 3.3, worsening renal function with creatinine 2.2. CXR showed right lower lobe infiltration. Patient is admitted to inpatient for further eval and treatment.    HPI/Subjective: 4/25 A/O x4, states uses home O2 1-2 L PRN. States began to have SOB not relieved with home O2 Exposed to multiple sick contacts at church. Negative fever, chills, shakes.  Assessment/Plan:  Acute on Chronic Respiratory failure with Hypoxia/Sepsis/HAP:  -HAP vs. aspiration pneumonia. Patient may have possible underlying COPD given on history of smoking in the past. He has mild wheezing on auscultation, indicating possible COPD exacerbation. Patient is septic with leukocytosis, fever, tachycardia, tachypnea and elevated lactate. Currently hemodynamically stable. -Nebulizers: scheduled Atroven and prn xopenex -IV vancomycin and Zosyn -Flutter valve -Physiotherapy vest  BID -Solu-Medrol 60 mg daily -Sputum culture pending - Respiratory virus panel pending -Urine S. Pneumococcal and Legionella antigen pending -Mucinex DM BID  -Follow up blood culture x2, sputum culture, respiratory virus panel, Flu pcr -prn BiPAP -Trend lactic acid levels per sepsis protocol. -Normal saline 6ml/hr   Elevated trop: - trop 0.14. No CP, most likely due to demanding ischemia secondary to hypoxia and sepsis.  -EKG showed multifocal atrial tachycardia and a mild ST depression in V4-V6. -Troponin 3; trending down -Aspirin -Continue Metoprolol 12.5 mg BID  Multifocal atrial tachycardia:  -Most likely due to hypoxia -Treat underlying pneumonia and COPD -Telemetry monitor  Abdominal pain:  -Patient has a mild right upper quadrant abdominal pain, just below the right rib cage. May be related to coughing. -Check lipase -If gets worse, may consider ultrasound or CT abdomen/pelvis. -on Norco prn -Check A1c and FLP  Chronic diastolic (congestive) heart failure ([place]): - 2-D echo on 11/14/15 showed EF of 55-60% with grade 1 diastolic dysfunction. BNP is slightly elevated at 496.3, blood patient does not have leg edema or JVD. CHF seems to be compensated. - Because of sepsis and worsening renal function, will hold Lasix. - Continue metoprolol - Newly started low-dose aspirin  HLD: -Last LDL was 140 on 08/12/15 -Continue home medications: Zocor -Check FLP  Hx of Esophageal dysmotility: -SLP  TIA (transient ischemic attack): -on ASA and Zocor  Hx of Compression fracture of T12 vertebra ([place]): -prn Norco and tramdol  BPH: stable - Continue Flomax  Acute on CKD-stage II:  -Creatinine was 1.004/20/17. His creatinine is 2.20, BUN 66. Likely due to prerenal secondary to dehydration and continuation of diruetics. - improving with hydration -  Avoid ACEI and NSAIDs - hold lasix   Code  Status: FULL Family Communication: no family present at time of  exam Disposition Plan: Resolution sepsis    Consultants: Resolution sepsis  Procedure/Significant Events:    Culture 4/24 lood left/right Arm NGTD   Antibiotics: Zosyn 4/24>> Vancomycin 4/24>>   DVT prophylaxis: Subcutaneous heparin    Devices    LINES / TUBES:      Continuous Infusions: . sodium chloride 75 mL/hr at 12/22/15 0609    Objective: VITAL SIGNS: Temp: 98.4 F (36.9 C) (04/25 1116) Temp Source: Oral (04/25 1116) BP: 148/83 mmHg (04/25 1300) Pulse Rate: 81 (04/25 1400) SPO2; FIO2:   Intake/Output Summary (Last 24 hours) at 12/22/15 1419 Last data filed at 12/22/15 1400  Gross per 24 hour  Intake   2050 ml  Output    960 ml  Net   1090 ml     Exam: General: A/O 4, acute on chronic Respiratory distress Eyes: negative scleral hemorrhage, negative icterus ENT: Negative Runny nose, negative gingival bleeding, Neck:  Negative scars, masses, torticollis, lymphadenopathy, JVD Lungs: diffuse poor air movement, with rhonchi, negative wheezes or crackles Cardiovascular: Regular rate and rhythm without murmur gallop or rub normal S1 and S2 Abdomen:negative abdominal pain, nondistended, positive soft, bowel sounds, no rebound, no ascites, no appreciable mass Extremities: No significant cyanosis, clubbing, or edema bilateral lower extremities Psychiatric:  Negative depression, negative anxiety, negative fatigue, negative mania  Neurologic:  Cranial nerves II through XII intact, tongue/uvula midline, all extremities muscle strength 5/5, sensation intact throughout, negative dysarthria, negative expressive aphasia, negative receptive aphasia.   Data Reviewed: Basic Metabolic Panel:  Recent Labs Lab 12/17/15 1322 12/21/15 0355 12/22/15 0231  NA 133* 130* 132*  K 3.7 3.3* 3.9  CL 90* 86* 98*  CO2 37* 27 24  GLUCOSE 115* 116* 128*  BUN 18 66* 57*  CREATININE 1.00 2.20* 1.74*  CALCIUM 9.3 8.0* 7.9*  MG  --   --  1.7   Liver Function  Tests:  Recent Labs Lab 12/17/15 1322 12/21/15 0355 12/22/15 0231  AST 17 57* 149*  ALT 15 43 92*  ALKPHOS 122* 107 103  BILITOT 0.4 0.8 1.0  PROT 7.4 6.9 5.6*  ALBUMIN 3.4* 2.3* 1.8*    Recent Labs Lab 12/21/15 0637  LIPASE 15   No results for input(s): AMMONIA in the last 168 hours. CBC:  Recent Labs Lab 12/17/15 1322 12/21/15 0355 12/22/15 0231  WBC 9.3 19.0* 10.6*  NEUTROABS 6.8 16.9*  --   HGB 12.1* 11.9* 9.3*  HCT 37.0* 35.6* 28.1*  MCV 80.6 78.6 78.3  PLT 324.0 264 192   Cardiac Enzymes:  Recent Labs Lab 12/21/15 0355 12/21/15 0637 12/21/15 1205 12/21/15 1821  TROPONINI 0.14* 0.12* 0.09* 0.08*   BNP (last 3 results)  Recent Labs  03/31/15 0750 11/12/15 2145 12/21/15 0355  BNP 167.4* 161.2* 496.3*    ProBNP (last 3 results) No results for input(s): PROBNP in the last 8760 hours.  CBG:  Recent Labs Lab 12/21/15 0726  GLUCAP 130*    Recent Results (from the past 240 hour(s))  Culture, blood (routine x 2)     Status: None (Preliminary result)   Collection Time: 12/21/15  3:55 AM  Result Value Ref Range Status   Specimen Description BLOOD LEFT ARM  Final   Special Requests BOTTLES DRAWN AEROBIC AND ANAEROBIC 5CC EA  Final   Culture NO GROWTH 1 DAY  Final   Report Status PENDING  Incomplete  Culture, blood (routine x 2)     Status: None (  Preliminary result)   Collection Time: 12/21/15  4:31 AM  Result Value Ref Range Status   Specimen Description BLOOD RIGHT ARM  Final   Special Requests IN PEDIATRIC BOTTLE 4CC  Final   Culture NO GROWTH 1 DAY  Final   Report Status PENDING  Incomplete  MRSA PCR Screening     Status: None   Collection Time: 12/21/15  7:40 AM  Result Value Ref Range Status   MRSA by PCR NEGATIVE NEGATIVE Final    Comment:        The GeneXpert MRSA Assay (FDA approved for NASAL specimens only), is one component of a comprehensive MRSA colonization surveillance program. It is not intended to diagnose  MRSA infection nor to guide or monitor treatment for MRSA infections.      Studies:  Recent x-ray studies have been reviewed in detail by the Attending Physician  Scheduled Meds:  Scheduled Meds: . aspirin EC  81 mg Oral Daily  . dextromethorphan-guaiFENesin  1 tablet Oral BID  . feeding supplement (ENSURE ENLIVE)  237 mL Oral BID BM  . heparin  5,000 Units Subcutaneous Q8H  . ipratropium  0.5 mg Nebulization BID  . levalbuterol  1.25 mg Nebulization BID  . metoprolol tartrate  12.5 mg Oral BID  . neomycin-polymyxin b-dexamethasone  1 application Both Eyes BID  . oxybutynin  5 mg Oral QHS  . piperacillin-tazobactam (ZOSYN)  IV  3.375 g Intravenous Q8H  . simvastatin  20 mg Oral q1800  . tamsulosin  0.4 mg Oral Daily  . tobramycin  1 drop Both Eyes BID  . vancomycin  500 mg Intravenous Q24H    Time spent on care of this patient: 40 mins   WOODS, Geraldo Docker , MD  [facility] Office  [phone] Pager - [phone]  On-Call/Text Page:      [email or web address]      password [email or web address]  If 7PM-7AM, please contact night-coverage [email or web address] Password [email or web address] 12/22/2015, 2:19 PM   LOS: 1 day   Care during the described time interval was provided by me .  I have reviewed this patient's available data, including medical history, events of note, physical examination, and all test results as part of my evaluation. I have personally reviewed and interpreted all radiology studies.   Dia Crawford, MD [phone] Pager

## 2015-12-22 NOTE — Progress Notes (Signed)
Speech Language Pathology Treatment: Dysphagia  Patient Details Name: [name] MRN: [id number] DOB: 1925-07-26 Today's Date: 12/22/2015 Time: [id number] SLP Time Calculation (min) (ACUTE ONLY): 9 min  Assessment / Plan / Recommendation Clinical Impression  Pt had his nasal cannula off upon SLP arrival with SpO2 at 85%. With supplemental O2 back in place and Min cues from SLP for deep inhalations, SpO2 quickly recovered. Audible congestion again noted at baseline with wet vocal quality x1 during thin liquid trials. RN reports coughing during breakfast, particularly toward the end of the meal. Continue to suspect a primary esophageal component but cannot exclude secondary pharyngeal impairments given coughing and changes in vocal quality as noted above. Recommend Dys 2 diet and nectar thick liquids with continued use of aspiration and esophageal precautions. Will continue to follow.   HPI HPI: 82 y.o. male with history of prior tobacco abuse, HTN, HLD, diastolic CHF, TIA, stroke, esophageal stricture, macular degeneration, CKD stage II, BPH, GERD, and possible COPD who presented with cough, fever and shortness of breath. Patient was hospitalized 3/16 > 11/16/15 for a CHF exacerbation.CXR showed right lower lobe infiltration. Pt has had several swallow evaluations (no MBS) by SLP over the past 2 years, all of which have recommended softer solids and thin liquids due to suspected primary esophageal issues.      SLP Plan  Continue with current plan of care     Recommendations  Diet recommendations: Dysphagia 2 (fine chop);Nectar-thick liquid Liquids provided via: Cup;Straw Medication Administration: Whole meds with puree Supervision: Patient able to self feed;Full supervision/cueing for compensatory strategies Compensations: Slow rate;Small sips/bites;Follow solids with liquid Postural Changes and/or Swallow Maneuvers: Seated upright 90 degrees;Upright 30-60 min after meal             Oral Care Recommendations: Oral care BID Follow up Recommendations: Skilled Nursing facility Plan: Continue with current plan of care     GO               [name], M.A. CCC-SLP [phone]  [name] 12/22/2015, 10:14 AM

## 2015-12-23 ENCOUNTER — Inpatient Hospital Stay (HOSPITAL_COMMUNITY): Payer: Medicare Other

## 2015-12-23 DIAGNOSIS — N400 Enlarged prostate without lower urinary tract symptoms: Secondary | ICD-10-CM

## 2015-12-23 DIAGNOSIS — I5032 Chronic diastolic (congestive) heart failure: Secondary | ICD-10-CM

## 2015-12-23 DIAGNOSIS — J9621 Acute and chronic respiratory failure with hypoxia: Secondary | ICD-10-CM

## 2015-12-23 DIAGNOSIS — J9601 Acute respiratory failure with hypoxia: Secondary | ICD-10-CM

## 2015-12-23 LAB — COMPREHENSIVE METABOLIC PANEL
ALK PHOS: 105 U/L (ref 38–126)
ALT: 112 U/L — AB (ref 17–63)
AST: 116 U/L — ABNORMAL HIGH (ref 15–41)
Albumin: 1.6 g/dL — ABNORMAL LOW (ref 3.5–5.0)
Anion gap: 11 (ref 5–15)
BUN: 49 mg/dL — ABNORMAL HIGH (ref 6–20)
CALCIUM: 8 mg/dL — AB (ref 8.9–10.3)
CHLORIDE: 102 mmol/L (ref 101–111)
CO2: 24 mmol/L (ref 22–32)
CREATININE: 1.44 mg/dL — AB (ref 0.61–1.24)
GFR, EST AFRICAN AMERICAN: 48 mL/min — AB (ref >60)
GFR, EST NON AFRICAN AMERICAN: 42 mL/min — AB (ref >60)
Glucose, Bld: 108 mg/dL — ABNORMAL HIGH (ref 65–99)
Potassium: 4 mmol/L (ref 3.5–5.1)
Sodium: 137 mmol/L (ref 135–145)
Total Bilirubin: 0.7 mg/dL (ref 0.3–1.2)
Total Protein: 5.3 g/dL — ABNORMAL LOW (ref 6.5–8.1)

## 2015-12-23 LAB — BASIC METABOLIC PANEL
BUN: 49 mg/dL — AB (ref 4–21)
Creatinine: 1.4 mg/dL — AB (ref 0.6–1.3)
Glucose: 108 mg/dL
SODIUM: 137 mmol/L (ref 137–147)

## 2015-12-23 LAB — CBC WITH DIFFERENTIAL/PLATELET
BASOS ABS: 0 10*3/uL (ref 0.0–0.1)
Basophils Relative: 0 %
EOS ABS: 0 10*3/uL (ref 0.0–0.7)
Eosinophils Relative: 0 %
HCT: 26.3 % — ABNORMAL LOW (ref 39.0–52.0)
Hemoglobin: 8.6 g/dL — ABNORMAL LOW (ref 13.0–17.0)
Lymphocytes Relative: 4 %
Lymphs Abs: 0.6 10*3/uL — ABNORMAL LOW (ref 0.7–4.0)
MCH: 25.6 pg — ABNORMAL LOW (ref 26.0–34.0)
MCHC: 32.7 g/dL (ref 30.0–36.0)
MCV: 78.3 fL (ref 78.0–100.0)
MONO ABS: 0.6 10*3/uL (ref 0.1–1.0)
Monocytes Relative: 4 %
NEUTROS PCT: 92 %
Neutro Abs: 13.2 10*3/uL — ABNORMAL HIGH (ref 1.7–7.7)
PLATELETS: 237 10*3/uL (ref 150–400)
RBC: 3.36 MIL/uL — AB (ref 4.22–5.81)
RDW: 15.3 % (ref 11.5–15.5)
WBC: 14.4 10*3/uL — AB (ref 4.0–10.5)

## 2015-12-23 LAB — HEMOGLOBIN A1C
Hgb A1c MFr Bld: 6.2 % — ABNORMAL HIGH (ref 4.8–5.6)
MEAN PLASMA GLUCOSE: 131 mg/dL

## 2015-12-23 LAB — HEPATIC FUNCTION PANEL: BILIRUBIN, TOTAL: 0.7 mg/dL

## 2015-12-23 LAB — CBC AND DIFFERENTIAL: WBC: 14.4 10^3/mL

## 2015-12-23 LAB — MAGNESIUM: MAGNESIUM: 1.8 mg/dL (ref 1.7–2.4)

## 2015-12-23 MED ORDER — LEVOFLOXACIN 750 MG PO TABS
750.0000 mg | ORAL_TABLET | ORAL | Status: DC
  Administered 2015-12-23: 750 mg via ORAL
  Filled 2015-12-23: qty 1

## 2015-12-23 MED ORDER — LEVOFLOXACIN 750 MG PO TABS: 750.0000 mg | ORAL_TABLET | ORAL | Status: DC

## 2015-12-23 MED ORDER — DM-GUAIFENESIN ER 30-600 MG PO TB12: 1.0000 | ORAL_TABLET | Freq: Two times a day (BID) | ORAL | Status: DC

## 2015-12-23 MED ORDER — PREDNISONE 10 MG PO TABS: ORAL_TABLET | ORAL | Status: DC

## 2015-12-23 MED ORDER — ASPIRIN 81 MG PO TBEC: 81.0000 mg | DELAYED_RELEASE_TABLET | Freq: Every day | ORAL | Status: AC

## 2015-12-23 MED ORDER — TRAMADOL HCL 50 MG PO TABS: 50.0000 mg | ORAL_TABLET | Freq: Two times a day (BID) | ORAL | Status: DC | PRN

## 2015-12-23 MED ORDER — HYDROCODONE-ACETAMINOPHEN 5-325 MG PO TABS: 0.5000 | ORAL_TABLET | Freq: Four times a day (QID) | ORAL | Status: DC | PRN

## 2015-12-23 MED ORDER — LEVALBUTEROL HCL 0.63 MG/3ML IN NEBU: 0.6300 mg | INHALATION_SOLUTION | Freq: Four times a day (QID) | RESPIRATORY_TRACT | Status: DC | PRN

## 2015-12-23 MED ORDER — LEVALBUTEROL HCL 1.25 MG/0.5ML IN NEBU: 1.2500 mg | INHALATION_SOLUTION | Freq: Three times a day (TID) | RESPIRATORY_TRACT | Status: DC

## 2015-12-23 NOTE — Clinical Social Work Note (Signed)
Clinical Social Work Assessment  Patient Details  Name: [name] MRN: [id number] Date of Birth: 08-30-1924  Date of referral:  12/23/15               Reason for consult:  Facility Placement                Permission sought to share information with:  Facility Sport and exercise psychologist, Family Supports Permission granted to share information::  No (Patient disoriented; completed assessment w/son)  Name::     [name]  Agency::  [facility] SNFs  Relationship::  Dorthy Cooler  Contact Information:  [phone]  Housing/Transportation Living arrangements for the past 2 months:  [place] of Information:  Adult Children Patient Interpreter Needed:  None Criminal Activity/Legal Involvement Pertinent to Current Situation/Hospitalization:  No - Comment as needed Significant Relationships:  Adult Children Lives with:  Self (Wife just passed away this week) Do you feel safe going back to the place where you live?  No Need for family participation in patient care:  Yes (Comment)  Care giving concerns:  CSW received referral for possible SNF placement at time of discharge. Patient is disoriented. CSW spoke with patient's son and brother-in-law regarding PT recommendation of SNF placement at time of discharge. Per patient's son, patient's wife just passed away and the son is currently unable to care for patient at their home given patient's current physical needs and fall risk. Patient's son expressed understanding of PT recommendation and is agreeable to SNF placement at time of discharge. CSW to continue to follow and assist with discharge planning needs.   Social Worker assessment / plan:  CSW spoke with patient's son concerning possibility of rehab at [facility] before returning home.  Employment status:  Retired Nurse, adult PT Recommendations:  [place] / Referral to community resources:  [place]  Patient/Family's Response to care:  Patient's son recognizes need for rehab before returning home and is agreeable to a SNF in [place]. Patient reported preference for [facility].  Patient/Family's Understanding of and Emotional Response to Diagnosis, Current Treatment, and Prognosis:  Patient is realistic regarding therapy needs. No questions/concerns about plan or treatment.    Emotional Assessment Appearance:  Appears stated age Attitude/Demeanor/Rapport:  Unable to Assess Affect (typically observed):  Unable to Assess Orientation:  Oriented to Self, Oriented to Situation Alcohol / Substance use:  Not Applicable Psych involvement (Current and /or in the community):  No (Comment)  Discharge Needs  Concerns to be addressed:  Care Coordination Readmission within the last 30 days:  No Current discharge risk:  None Barriers to Discharge:  No Barriers Identified   Benard Halsted, [place] 12/23/2015, 3:12 PM

## 2015-12-23 NOTE — Progress Notes (Signed)
Patient will DC to: [place] Anticipated DC date: 12/23/15 Family notified: [name] Transport by: Corey Harold   Per MD patient ready for DC to [facility]. RN, patient, patient's family, and facility notified of DC. RN given number for report. DC packet on chart. Ambulance transport requested for patient.   CSW signing off.  Cedric Fishman, [place] Social Worker [phone]

## 2015-12-23 NOTE — Progress Notes (Signed)
MBSS complete. Full report located under chart review in imaging section.  Chrystina Naff Paiewonsky, M.A. CCC-SLP [phone]  

## 2015-12-23 NOTE — Progress Notes (Signed)
Speech Language Pathology Treatment: Dysphagia  Patient Details Name: [name] MRN: [id number] DOB: [birth year] Today's Date: 12/23/2015 Time: [id number] SLP Time Calculation (min) (ACUTE ONLY): 13 min  Assessment / Plan / Recommendation Clinical Impression  SLP provided skilled observation as pt was finishing his breakfast meal. NT reports frequent coughing during breakfast this morning, although no coughing observed during SLP observation with pureed foods and nectar thick liquids. Audible congestion still noted, difficult to discern if this is related to PNA versus penetration/aspiration. Given the above, as well as pt's h/o repeated PNA, discussed option of MBS with pt/family. Continue to suspect a primary esophageal dysphagia, although they wish to proceed with MBS to determine if oropharyngeal component exists to possibly reduce his risk of aspiration. Will plan for this morning with radiology, as family reports trying to d/c today in order for pt to attend his wife's funeral tomorrow.   HPI HPI: 82 y.o. male with history of prior tobacco abuse, HTN, HLD, diastolic CHF, TIA, stroke, esophageal stricture, macular degeneration, CKD stage II, BPH, GERD, and possible COPD who presented with cough, fever and shortness of breath. Patient was hospitalized 3/16 > 11/16/15 for a CHF exacerbation.CXR showed right lower lobe infiltration. Pt has had several swallow evaluations (no MBS) by SLP over the past 2 years, all of which have recommended softer solids and thin liquids due to suspected primary esophageal issues.      SLP Plan  MBS     Recommendations  Diet recommendations: Dysphagia 2 (fine chop);Nectar-thick liquid Liquids provided via: Cup;Straw Medication Administration: Whole meds with puree Supervision: Patient able to self feed;Full supervision/cueing for compensatory strategies Compensations: Slow rate;Small sips/bites;Follow solids with liquid Postural Changes and/or Swallow  Maneuvers: Seated upright 90 degrees;Upright 30-60 min after meal             Oral Care Recommendations: Oral care BID Follow up Recommendations: Skilled Nursing facility Plan: MBS     GO               Germain Osgood, M.A. CCC-SLP [phone]  Germain Osgood 12/23/2015, 9:17 AM

## 2015-12-23 NOTE — NC FL2 (Signed)
[place] LEVEL OF CARE SCREENING TOOL     IDENTIFICATION  Patient Name: [name] Birthdate: 11-12-1924 Sex: male Admission Date (Current Location): 12/21/2015  [facility] and [place] Number:  Herbalist and Address:  [facility] [place], [place] [address] [place], [place] [address]      Provider Number: [id number]  Attending Physician Name and Address:  Theodis Blaze, MD  Relative Name and Phone Number:  Inocente Salles, son, [phone]    Current Level of Care: Hospital Recommended Level of Care: [place] Prior Approval Number:    Date Approved/Denied:   PASRR Number: [id number]  Discharge Plan: SNF    Current Diagnoses: Patient Active Problem List   Diagnosis Date Noted  . Acute on chronic respiratory failure with hypoxia ([place])   . Sepsis, unspecified organism ([place])   . HAP (hospital-acquired pneumonia)   . Chronic diastolic CHF (congestive heart failure) ([place])   . Acute renal failure superimposed on stage 3 chronic kidney disease ([place])   . HTN (hypertension) 12/21/2015  . HLD (hyperlipidemia) 12/21/2015  . Chronic diastolic (congestive) heart failure ([place]) 12/21/2015  . Acute on chronic kidney failure-II 12/21/2015  . Acute respiratory failure with hypoxia ([place]) 12/21/2015  . HCAP (healthcare-associated pneumonia) 12/21/2015  . Sepsis ([place]) 12/21/2015  . Abdominal pain 12/21/2015  . Multifocal atrial tachycardia ([place]) 12/21/2015  . Elevated troponin 12/21/2015  . Aspiration pneumonia ([place]) 12/21/2015  . Stress due to illness of family member 12/18/2015  . Hypertensive heart disease with CHF (congestive heart failure) ([place]) 12/01/2015  . BPH (benign prostatic hyperplasia) 12/01/2015  . Diastolic CHF, acute on chronic (HCC) 11/13/2015  . Compression fracture of T12 vertebra ([place]) 11/13/2015  . Leukocytosis 11/13/2015  . Hypervolemia   . Vertebral compression fracture ([place])   . Volume overload 11/12/2015  .  Loss of weight 10/20/2015  . Lower urinary tract symptoms (LUTS) 10/20/2015  . CVA (cerebral infarction) 08/11/2015  . Subcapital fracture of hip ([place]) 03/27/2015  . Hip fracture ([place]) 03/27/2015  . Esophageal reflux 03/25/2015  . Memory change 12/01/2014  . Weakness 08/08/2014  . Atrial fibrillation with RVR ([place]) 08/01/2014  . Diastolic dysfunction [id number]  . Lactic acidosis 08/01/2014  . Respiratory distress 07/31/2014  . Acute on chronic diastolic heart failure ([place]) 12/20/2013  . Dysphagia 12/17/2013  . Abnormality of gait 01/18/2013  . Cough 01/18/2013  . Routine general medical examination at a health care facility 05/31/2012  . Esophagitis 10/26/2011  . Chest wall pain 08/30/2011  . TIA (transient ischemic attack) 03/23/2011  . B12 deficiency 03/23/2011  . Esophageal dysmotility 10/28/2010  . Other urinary incontinence 10/28/2010  . TOBACCO ABUSE 04/26/2010  . PNEUMONIA, BILATERAL 02/08/2009  . HYPERCHOLESTEROLEMIA 07/14/2008  . ABDOMINAL BRUIT 07/14/2008    Orientation RESPIRATION BLADDER Height & Weight     Self, Situation  O2 (3L/min) Incontinent, External catheter (Condom catheter) Weight: 143 lb 8 oz (65.091 kg) Height:  5\' 6"  (167.6 cm)  BEHAVIORAL SYMPTOMS/MOOD NEUROLOGICAL BOWEL NUTRITION STATUS      Incontinent Diet (Please see DC summary)  AMBULATORY STATUS COMMUNICATION OF NEEDS Skin   Extensive Assist Verbally Normal                       Personal Care Assistance Level of Assistance  Bathing, Feeding, Dressing Bathing Assistance: Maximum assistance Feeding assistance: Limited assistance Dressing Assistance: Limited assistance     Functional Limitations Info  Hearing   Hearing Info: Impaired  SPECIAL CARE FACTORS FREQUENCY  PT (By licensed PT), OT (By licensed OT)     PT Frequency: min 3x/week OT Frequency: min 2x/week            Contractures Contractures Info: Not present    Additional Factors Info  Code Status,  Allergies, Isolation Precautions Code Status Info: Full Allergies Info:  NKA     Isolation Precautions Info: Droplet Precautions     Current Medications (12/23/2015):  This is the current hospital active medication list Current Facility-Administered Medications  Medication Dose Route Frequency Provider Last Rate Last Dose  . 0.9 %  sodium chloride infusion   Intravenous Continuous Ivor Costa, MD 75 mL/hr at 12/22/15 0609    . aspirin EC tablet 81 mg  81 mg Oral Daily Ivor Costa, MD   81 mg at 12/23/15 [id number]  . dextromethorphan-guaiFENesin (MUCINEX DM) 30-600 MG per 12 hr tablet 1 tablet  1 tablet Oral BID Ivor Costa, MD   1 tablet at 12/23/15 [id number]  . feeding supplement (ENSURE ENLIVE) (ENSURE ENLIVE) liquid 237 mL  237 mL Oral BID BM Allie Bossier, MD   237 mL at 12/23/15 0814  . heparin injection 5,000 Units  5,000 Units Subcutaneous Q8H Ivor Costa, MD   5,000 Units at 12/23/15 0531  . HYDROcodone-acetaminophen (NORCO/VICODIN) 5-325 MG per tablet 0.5-1 tablet  0.5-1 tablet Oral Q6H PRN Ivor Costa, MD      . ipratropium (ATROVENT) nebulizer solution 0.5 mg  0.5 mg Nebulization TID Allie Bossier, MD   0.5 mg at 12/23/15 0737  . levalbuterol (XOPENEX) nebulizer solution 0.63 mg  0.63 mg Nebulization Q6H PRN Allie Bossier, MD   0.63 mg at 12/22/15 1520  . levalbuterol (XOPENEX) nebulizer solution 1.25 mg  1.25 mg Nebulization TID Allie Bossier, MD   1.25 mg at 12/23/15 0737  . levofloxacin (LEVAQUIN) tablet 750 mg  750 mg Oral Q48H Theodis Blaze, MD   750 mg at 12/23/15 1054  . methylPREDNISolone sodium succinate (SOLU-MEDROL) 125 mg/2 mL injection 60 mg  60 mg Intravenous Q24H Allie Bossier, MD   60 mg at 12/22/15 1615  . metoprolol tartrate (LOPRESSOR) tablet 12.5 mg  12.5 mg Oral BID Ivor Costa, MD   12.5 mg at 12/23/15 0815  . neomycin-polymyxin b-dexamethasone (MAXITROL) ophthalmic ointment 1 application  1 application Both Eyes BID Ivor Costa, MD   1 application at [id number]  . oxybutynin  (DITROPAN-XL) 24 hr tablet 5 mg  5 mg Oral QHS Ivor Costa, MD   5 mg at 12/22/15 2306  . simvastatin (ZOCOR) tablet 20 mg  20 mg Oral q1800 Ivor Costa, MD   20 mg at 12/22/15 1800  . tamsulosin (FLOMAX) capsule 0.4 mg  0.4 mg Oral Daily Ivor Costa, MD   0.4 mg at 12/23/15 0815  . tobramycin (TOBREX) 0.3 % ophthalmic solution 1 drop  1 drop Both Eyes BID Ivor Costa, MD   1 drop at 12/23/15 0816  . traMADol (ULTRAM) tablet 50 mg  50 mg Oral Q12H PRN Ivor Costa, MD   50 mg at 12/21/15 1226     Discharge Medications: Please see discharge summary for a list of discharge medications.  Relevant Imaging Results:  Relevant Lab Results:   Additional Information Social Security Number [id number]  Benard [place], [place]

## 2015-12-23 NOTE — Discharge Instructions (Signed)
Community-Acquired Pneumonia, Adult °Pneumonia is an infection of the lungs. There are different types of pneumonia. One type can develop while a person is in a hospital. A different type, called community-acquired pneumonia, develops in people who are not, or have not recently been, in the hospital or other health care facility.  °CAUSES °Pneumonia may be caused by bacteria, viruses, or funguses. Community-acquired pneumonia is often caused by Streptococcus pneumonia bacteria. These bacteria are often passed from one person to another by breathing in droplets from the cough or sneeze of an infected person. °RISK FACTORS °The condition is more likely to develop in: °· People who have chronic diseases, such as chronic obstructive pulmonary disease (COPD), asthma, congestive heart failure, cystic fibrosis, diabetes, or kidney disease. °· People who have early-stage or late-stage HIV. °· People who have sickle cell disease. °· People who have had their spleen removed (splenectomy). °· People who have poor dental hygiene. °· People who have medical conditions that increase the risk of breathing in (aspirating) secretions their own mouth and nose.   °· People who have a weakened immune system (immunocompromised). °· People who smoke. °· People who travel to areas where pneumonia-causing germs commonly exist. °· People who are around animal habitats or animals that have pneumonia-causing germs, including birds, bats, rabbits, cats, and farm animals. °SYMPTOMS °Symptoms of this condition include: °· A dry cough. °· A wet (productive) cough. °· Fever. °· Sweating. °· Chest pain, especially when breathing deeply or coughing. °· Rapid breathing or difficulty breathing. °· Shortness of breath. °· Shaking chills. °· Fatigue. °· Muscle aches. °DIAGNOSIS °Your health care provider will take a medical history and perform a physical exam. You may also have other tests, including: °· Imaging studies of your chest, including  X-rays. °· Tests to check your blood oxygen level and other blood gases. °· Other tests on blood, mucus (sputum), fluid around your lungs (pleural fluid), and urine. °If your pneumonia is severe, other tests may be done to identify the specific cause of your illness. °TREATMENT °The type of treatment that you receive depends on many factors, such as the cause of your pneumonia, the medicines you take, and other medical conditions that you have. For most adults, treatment and recovery from pneumonia may occur at home. In some cases, treatment must happen in a hospital. Treatment may include: °· Antibiotic medicines, if the pneumonia was caused by bacteria. °· Antiviral medicines, if the pneumonia was caused by a virus. °· Medicines that are given by mouth or through an IV tube. °· Oxygen. °· Respiratory therapy. °Although rare, treating severe pneumonia may include: °· Mechanical ventilation. This is done if you are not breathing well on your own and you cannot maintain a safe blood oxygen level. °· Thoracentesis. This procedure removes fluid around one lung or both lungs to help you breathe better. °HOME CARE INSTRUCTIONS °· Take over-the-counter and prescription medicines only as told by your health care provider. °¨ Only take cough medicine if you are losing sleep. Understand that cough medicine can prevent your body's natural ability to remove mucus from your lungs. °¨ If you were prescribed an antibiotic medicine, take it as told by your health care provider. Do not stop taking the antibiotic even if you start to feel better. °· Sleep in a semi-upright position at night. Try sleeping in a reclining chair, or place a few pillows under your head. °· Do not use tobacco products, including cigarettes, chewing tobacco, and e-cigarettes. If you need help quitting, ask your health care provider. °· Drink enough water to keep your urine   clear or pale yellow. This will help to thin out mucus secretions in your  lungs. PREVENTION There are ways that you can decrease your risk of developing community-acquired pneumonia. Consider getting a pneumococcal vaccine if:  You are older than 80 years of age.  You are older than 80 years of age and are undergoing cancer treatment, have chronic lung disease, or have other medical conditions that affect your immune system. Ask your health care provider if this applies to you. There are different types and schedules of pneumococcal vaccines. Ask your health care provider which vaccination option is best for you. You may also prevent community-acquired pneumonia if you take these actions:  Get an influenza vaccine every year. Ask your health care provider which type of influenza vaccine is best for you.  Go to the dentist on a regular basis.  Wash your hands often. Use hand sanitizer if soap and water are not available. SEEK MEDICAL CARE IF:  You have a fever.  You are losing sleep because you cannot control your cough with cough medicine. SEEK IMMEDIATE MEDICAL CARE IF:  You have worsening shortness of breath.  You have increased chest pain.  Your sickness becomes worse, especially if you are an older adult or have a weakened immune system.  You cough up blood.   This information is not intended to replace advice given to you by your health care provider. Make sure you discuss any questions you have with your health care provider.   Document Released: 08/15/2005 Document Revised: 05/06/2015 Document Reviewed: 12/10/2014 Elsevier Interactive Patient Education 2016 Elsevier Inc.   Dysphagia Diet Level 1, Pureed The dysphasia level 1 diet includes foods that are completely pureed and smooth. The foods have a pudding-like texture, such as the texture of pureed pancakes, mashed potatoes, and yogurt. The diet does not include foods with lumps or coarse textures. Liquids should be smooth and may either be thin, nectar-thick, honey-like, or  spoon-thick. This diet is helpful for people with moderate to severe swallowing problems. It reduces the risk of food getting caught in the windpipe, trachea, or lungs. You may need help or supervision during meals while following this diet. WHAT DO I NEED TO KNOW ABOUT THIS DIET? Foods  You may eat foods that are soft and have a pudding-like texture. If a food does not have this texture, you may be able to eat the food after:  Pureeing it. This can be done with a blender or whisk.  Moistening it with liquid. For example, you may have bread if you soak it in milk or syrup.  Avoid foods that are hard, dry, sticky, chunky, lumpy, or stringy. Also avoid foods with nuts, seeds, raisins, skins, and pulp.  Do not eat foods that you have to chew. If you have to chew the food, then you cannot eat it.  Eat a variety of foods to get all the nutrients you need. Liquids  You may drink liquids that are smooth. Your health care provider will tell you if you should drink thin or thickened liquids.  To thicken a liquid, use a food and beverage thickener or a thickening food. Thickened liquids are usually a "pudding-like" consistency.  Thin liquids include fruit juices, milk, coffee, tea, yogurts, shakes, and similar foods that melt to thin liquid at room temperature.  Avoid liquids with seeds, pulp, or chunks. See your dietitian or health care provider regularly for help with your dietary changes. WHAT FOODS CAN I EAT? Grains Store-bought soft breads,  pancakes, and [place] toast that have a smooth, moist texture and do not have nuts or seeds (you will need to moisten the food with liquid). Cooked cereals that have a pudding-like consistency, such as cream of wheat or farina (no oatmeal). Pureed, well-cooked pasta, rice, and plain bread stuffing. Vegetables Pureed vegetables. Soft avocado. Smooth tomato paste or sauce. Strained or pureed soups (these may need to be thickened as directed). Mashed or  pureed potatoes without skin (can be seasoned with butter, smooth gravy, margarine, or sour cream). Fruits Pureed fruits such as melons and apples without seeds or pulp. Mashed bananas. Smooth tomato paste or sauce. Fruit juices without pulp or seeds. Strained or pureed soups. Meat and Other Protein Sources Pureed meat. Smooth pate or liverwurst. Smooth souffles. Pureed beans (such as lentils). Pureed eggs. Dairy Yogurt. Smooth cheese sauces. Milk (may need to be thickened). Nutritional dairy drinks or shakes. Ask your health care provider whether you can have ice cream. Condiments Finely ground salt, pepper, and other ground spices. Sweets/Desserts Smooth puddings and custards. Pureed desserts. Souffles. Whipped topping. Ask your health care provider whether you can have frozen desserts. Fats and Oils Butter. Margarine. Smooth and strained gravy. Sour cream. Mayonnaise. Cream cheese. Whipped topping. Smooth sauces (such as white sauce, cheese sauce, or hollandaise sauce). The items listed above may not be a complete list of recommended foods or beverages. Contact your dietitian for more options. WHAT FOODS ARE NOT RECOMMENDED? Grains Oatmeal. Dry cereals. Hard breads. Vegetables Whole vegetables. Stringy vegetables (such as celery). Thin tomato sauce. Fruits Whole fresh, frozen, canned, or dried fruits that have not been pureed. Stringy fruits (such as pineapple). Meat and Other Protein Sources Whole or ground meat, fish, or poultry. Dried or cooked lentils or legumes that have been cooked but not mashed or pureed. Non-pureed eggs. Nuts and seeds. Peanut butter. Dairy Non-pureed cheese. Dairy products with lumps or chunks. Ask your health care provider whether you can have ice cream. Condiments Coarse or seeded herbs and spices. Sweets/Desserts [place] preserves. Jams with seeds. Solid desserts. Sticky, chewy sweets (such as licorice and caramel). Ask your health care provider whether you  can have frozen desserts. Fats and Oils Sauces of fats with lumps or chunks. The items listed above may not be a complete list of foods and beverages to avoid. Contact your dietitian for more information.   This information is not intended to replace advice given to you by your health care provider. Make sure you discuss any questions you have with your health care provider.   Document Released: 08/15/2005 Document Revised: 09/05/2014 Document Reviewed: 07/29/2013 Elsevier Interactive Patient Education [employer].

## 2015-12-23 NOTE — Discharge Summary (Signed)
Physician Discharge Summary  TREIGH KISHBAUGH [id number] DOB: 09/24/1924 DOA: 12/21/2015  PCP: [name], MD  Admit date: 12/21/2015 Discharge date: 12/23/2015  Recommendations for Outpatient Follow-up:  1. Pt will need to follow up with PCP in 1 week post discharge, or provider at the facility  2. Please obtain CMP to evaluate electrolytes and kidney function, liver function  3. Please note that pt has had elevation in liver enzymes and RUQ pain initially on admission which has resolved, since pt wants to be discharge and has no RUQ pain at this time, it is [facility] for pt to have close follow up, please recheck LFT's in one week and if worse or if pt has more pain, consider abd [place] with specific attention to RUQ to rule out gallbladder/liver etiology  4. Please also check CBC to evaluate Hg and Hct levels, WBC level 5. Pt needs to complete therapy with Levaquin, please note dosing of every other day, pt received one dose today 4/26 so next dose due on 4/28 6. Please note dysphagia I diet based on SLP evaluation  7. Please note that pt needs to complete Prednisone taper as outlined below   Discharge Diagnoses:  Principal Problem:   Acute respiratory failure with hypoxia ([place])  Discharge Condition: Stable  Diet recommendation: Dys I diet  History of present illness:  82 y.o. male with HTN, HLD, Chronic Diastolic CHF, TIA, Stroke, Esophageal Stricture, CKD stage II, BPH, presented with dyspnea associated with productive cough and fevers. Admitted for treatment of HCAP given recent hospitalization from 11/12/15 --> 11/16/15 for CHF exacerbation.  Assessment/Plan:  Acute on Chronic Respiratory failure with Hypoxia/Sepsis/HAP, unknown pathogen  - HAP vs. aspiration pneumonia. Patient may have possible underlying COPD given history of smoking in the past - pt looks more comfortable this AM, reports feeling better - pt and family asking to leave today as pt wants to attend his wife funeral  tomorrow - pt is stable for discharge with close follow up  - continue BD's scheduled and as needed - needs to complete treatment with Levaquin as outlined below - also complete Prednisone tapering  - please note that WBC still up and likely steroid induced with underlying PNA  Elevated troponin - trop 0.14 - No CP, most likely due to demanding ischemia secondary to hypoxia and sepsis.  - troponin trending down  - continue Aspirin - continue Metoprolol 12.5 mg BID  Multifocal atrial tachycardia - Most likely due to hypoxia - HR stable and WNL this AM  Abdominal pain, RUQ  - resolved but noted elevated liver enzymes for several days - pt wants to be discharged to attend his wife's funeral so will accommodate pt - pt was made aware of need for follow up but I am concerned that he is not focused on this right now as his wife just passed away and her funeral is tomorrow 12/24/2015 - please repeat LFT's in next week or sooner depending on pt's symptoms   Chronic diastolic (congestive) heart failure ([place]) - 2-D echo on 11/14/15 showed EF of 55-60% with grade 1 diastolic dysfunction. BNP is slightly elevated at 496 - patient does not have leg edema or JVD. CHF seems to be compensated. - weight on discharge 143 lbs   HLD - continue Zocor   Hx of Esophageal dysmotility - dys I diet based on SLP evaluation   TIA (transient ischemic attack) - on ASA and Zocor  Hx of Compression fracture of T12 vertebra (HCC) - prn Norco  and tramdol  BPH - stable - Continue Flomax  Acute on CKD-stage II:  - improving with hydration - OK to resume Lasix upon discharge with close monitoring   Code Status: FULL Family Communication: brother in law at bedside Disposition Plan: SNF, [name]    Cultures 4/24 lood left/right Arm NGTD  Antibiotics Zosyn 4/24 --> 4/26  Vancomycin 4/24 --> 4/26 Levaquin 4/26 --> 5 more days post discharge, please note EVERY OTHER DAY dosing, next dose due  on 4/28  DVT prophylaxis Subcutaneous heparin  Procedures/Studies: Dg Chest Port 1 View 12/22/2015 Persistent right lower lobe infiltrate. New left lower lobe infiltrate. Small bilateral pleural effusions. 2. Cardiomegaly with interstitial prominence. A component of congestive heart failure is most likely present.  Dg Chest Port 1 View 12/21/2015 1. Right lower lobe pneumonia. 2. Emphysema.   Consultations:  None  Antibiotics:  Levaquin upon discharge   Discharge Exam: Filed Vitals:   12/23/15 0726 12/23/15 0815  BP:  134/71  Pulse:  69  Temp: 98.7 F (37.1 C)   Resp:     Filed Vitals:   12/23/15 0400 12/23/15 0726 12/23/15 0738 12/23/15 0815  BP:    134/71  Pulse:    69  Temp: 97.1 F (36.2 C) 98.7 F (37.1 C)    TempSrc: Oral Oral    Resp:      Height:      Weight:      SpO2:   96%     General: Pt is alert, follows commands appropriately, not in acute distress Cardiovascular: Regular rate and rhythm, no rubs, no gallops Respiratory: Rhonchi at bases with mild dullness to percussion and diminished breath sounds  Abdominal: Soft, non tender, non distended, bowel sounds +, no guarding Extremities: no cyanosis, pulses palpable bilaterally DP and PT Neuro: Grossly nonfocal  Discharge Instructions  Discharge Instructions    Diet - low sodium heart healthy    Complete by:  As directed      Increase activity slowly    Complete by:  As directed             Medication List    STOP taking these medications        albuterol-ipratropium 18-103 MCG/ACT inhaler  Commonly known as:  COMBIVENT      TAKE these medications        aspirin 81 MG EC tablet  Take 1 tablet (81 mg total) by mouth daily.     dextromethorphan-guaiFENesin 30-600 MG 12hr tablet  Commonly known as:  MUCINEX DM  Take 1 tablet by mouth 2 (two) times daily.  Notes to Patient:  TAKE ONE DOSE TONIGHT     furosemide 20 MG tablet  Commonly known as:  LASIX  Take 1 tablet (20 mg total) by  mouth daily.     HYDROcodone-acetaminophen 5-325 MG tablet  Commonly known as:  NORCO/VICODIN  Take 0.5-1 tablets by mouth every 6 (six) hours as needed for severe pain (if not better with tramadol.  sedation caution).     levalbuterol 0.63 MG/3ML nebulizer solution  Commonly known as:  XOPENEX  Take 3 mLs (0.63 mg total) by nebulization every 6 (six) hours as needed for wheezing or shortness of breath.     levalbuterol 1.25 MG/0.5ML nebulizer solution  Commonly known as:  XOPENEX  Take 1.25 mg by nebulization 3 (three) times daily.     levofloxacin 750 MG tablet  Commonly known as:  LEVAQUIN  Take 1 tablet (750 mg total) by mouth  every other day.     metoprolol tartrate 25 MG tablet  Commonly known as:  LOPRESSOR  Take 0.5 tablets (12.5 mg total) by mouth 2 (two) times daily.  Notes to Patient:  TAKE ONE DOSE TONIGHT     neomycin-polymyxin b-dexamethasone 3.5-10000-0.1 Oint  Commonly known as:  MAXITROL  Place 1 application into both eyes 2 (two) times daily.  Notes to Patient:  TAKE ONE DOSE TONIGHT     oxybutynin 5 MG 24 hr tablet  Commonly known as:  DITROPAN-XL  Take 1 tablet (5 mg total) by mouth at bedtime. Reported on 12/17/2015  Notes to Patient:  TAKE TONIGHT     predniSONE 10 MG tablet  Commonly known as:  DELTASONE  Take 50 mg tablet 12/24/2015 and taper down by 10 mg daily until completed     simvastatin 20 MG tablet  Commonly known as:  ZOCOR  Take 1 tablet (20 mg total) by mouth daily.  Notes to Patient:  TAKE TONIGHT     tamsulosin 0.4 MG Caps capsule  Commonly known as:  FLOMAX  Take 1 capsule (0.4 mg total) by mouth daily.     tobramycin 0.3 % ophthalmic solution  Commonly known as:  TOBREX  Place 1 drop into both eyes 2 (two) times daily.  Notes to Patient:  TAKE ONE DOSE TONIGHT     traMADol 50 MG tablet  Commonly known as:  ULTRAM  Take 1 tablet (50 mg total) by mouth every 12 (twelve) hours as needed (mild or moderate pain).             Follow-up Information    Follow up with [name], MD.   Specialty:  [facility]   Contact information:   [place] [address] [phone]        The results of significant diagnostics from this hospitalization (including imaging, microbiology, ancillary and laboratory) are listed below for reference.     Microbiology: Recent Results (from the past 240 hour(s))  Culture, blood (routine x 2)     Status: None (Preliminary result)   Collection Time: 12/21/15  3:55 AM  Result Value Ref Range Status   Specimen Description BLOOD LEFT ARM  Final   Special Requests BOTTLES DRAWN AEROBIC AND ANAEROBIC 5CC EA  Final   Culture NO GROWTH 1 DAY  Final   Report Status PENDING  Incomplete  Culture, blood (routine x 2)     Status: None (Preliminary result)   Collection Time: 12/21/15  4:31 AM  Result Value Ref Range Status   Specimen Description BLOOD RIGHT ARM  Final   Special Requests IN PEDIATRIC BOTTLE 4CC  Final   Culture NO GROWTH 1 DAY  Final   Report Status PENDING  Incomplete  Respiratory virus panel     Status: None   Collection Time: 12/21/15  7:40 AM  Result Value Ref Range Status   Respiratory Syncytial Virus A Negative Negative Final   Respiratory Syncytial Virus B Negative Negative Final   Influenza A Negative Negative Final   Influenza B Negative Negative Final   Parainfluenza 1 Negative Negative Final   Parainfluenza 2 Negative Negative Final   Parainfluenza 3 Negative Negative Final   Metapneumovirus Negative Negative Final   Rhinovirus Negative Negative Final   Adenovirus Negative Negative Final    Comment: (NOTE) Performed At: [facility] [address] [place], [place] [id number] Lindon Romp MD [id number]   MRSA PCR Screening  Status: None   Collection Time: 12/21/15  7:40 AM  Result Value Ref Range Status   MRSA by PCR NEGATIVE NEGATIVE Final    Comment:        The GeneXpert MRSA Assay (FDA approved for  NASAL specimens only), is one component of a comprehensive MRSA colonization surveillance program. It is not intended to diagnose MRSA infection nor to guide or monitor treatment for MRSA infections.      Labs: Basic Metabolic Panel:  Recent Labs Lab 12/17/15 1322 12/21/15 0355 12/22/15 0231 12/23/15 0543  NA 133* 130* 132* 137  K 3.7 3.3* 3.9 4.0  CL 90* 86* 98* 102  CO2 37* 27 24 24   GLUCOSE 115* 116* 128* 108*  BUN 18 66* 57* 49*  CREATININE 1.00 2.20* 1.74* 1.44*  CALCIUM 9.3 8.0* 7.9* 8.0*  MG  --   --  1.7 1.8   Liver Function Tests:  Recent Labs Lab 12/17/15 1322 12/21/15 0355 12/22/15 0231 12/23/15 0543  AST 17 57* 149* 116*  ALT 15 43 92* 112*  ALKPHOS 122* 107 103 105  BILITOT 0.4 0.8 1.0 0.7  PROT 7.4 6.9 5.6* 5.3*  ALBUMIN 3.4* 2.3* 1.8* 1.6*    Recent Labs Lab 12/21/15 0637  LIPASE 15   CBC:  Recent Labs Lab 12/17/15 1322 12/21/15 0355 12/22/15 0231 12/23/15 0543  WBC 9.3 19.0* 10.6* 14.4*  NEUTROABS 6.8 16.9*  --  13.2*  HGB 12.1* 11.9* 9.3* 8.6*  HCT 37.0* 35.6* 28.1* 26.3*  MCV 80.6 78.6 78.3 78.3  PLT 324.0 264 192 237   Cardiac Enzymes:  Recent Labs Lab 12/21/15 0355 12/21/15 0637 12/21/15 1205 12/21/15 1821  TROPONINI 0.14* 0.12* 0.09* 0.08*   BNP: BNP (last 3 results)  Recent Labs  03/31/15 0750 11/12/15 2145 12/21/15 0355  BNP 167.4* 161.2* 496.3*   CBG:  Recent Labs Lab 12/21/15 0726  GLUCAP 130*    SIGNED: Time coordinating discharge: 30 minutes  Faye Ramsay, MD  [facility] 12/23/2015, 11:32 AM Pager [phone]  If 7PM-7AM, please contact night-coverage [email or web address] Password [email or web address]

## 2015-12-23 NOTE — Clinical Social Work Placement (Signed)
   CLINICAL SOCIAL WORK PLACEMENT  NOTE  Date:  12/23/2015  Patient Details  Name: [name] MRN: [id number] Date of Birth: [birth year]  Clinical Social Work is seeking post-discharge placement for this patient at the [place] level of care (*CSW will initial, date and re-position this form in  chart as items are completed):  Yes   Patient/family provided with [place] Work Department's list of facilities offering this level of care within the geographic area requested by the patient (or if unable, by the patient's family).  Yes   Patient/family informed of their freedom to choose among providers that offer the needed level of care, that participate in Medicare, Medicaid or managed care program needed by the patient, have an available bed and are willing to accept the patient.  Yes   Patient/family informed of [facility]'s ownership interest in [facility] and [facility], as well as of the fact that they are under no obligation to receive care at these facilities.  PASRR submitted to EDS on       PASRR number received on       Existing PASRR number confirmed on 12/23/15     FL2 transmitted to all facilities in geographic area requested by pt/family on 12/23/15     FL2 transmitted to all facilities within larger geographic area on       Patient informed that his/her managed care company has contracts with or will negotiate with certain facilities, including the following:        Yes   Patient/family informed of bed offers received.  Patient chooses bed at [facility]     Physician recommends and patient chooses bed at      Patient to be transferred to [facility] on 12/23/15.  Patient to be transferred to facility by [facility]     Patient family notified on 12/23/15 of transfer.  Name of family member notified:  Son     PHYSICIAN       Additional Comment:    _______________________________________________ Benard Halsted, [place] 12/23/2015, 3:17 PM

## 2015-12-23 NOTE — Progress Notes (Signed)
[name] is able to accept patient today once insurance authorization is received.  Percell Locus Mardene Lessig LCSWA [phone]

## 2015-12-25 ENCOUNTER — Encounter: Payer: Self-pay | Admitting: Internal Medicine

## 2015-12-25 ENCOUNTER — Non-Acute Institutional Stay (SKILLED_NURSING_FACILITY): Payer: Medicare Other | Admitting: Internal Medicine

## 2015-12-25 DIAGNOSIS — R531 Weakness: Secondary | ICD-10-CM | POA: Diagnosis not present

## 2015-12-25 DIAGNOSIS — I248 Other forms of acute ischemic heart disease: Secondary | ICD-10-CM

## 2015-12-25 DIAGNOSIS — N400 Enlarged prostate without lower urinary tract symptoms: Secondary | ICD-10-CM

## 2015-12-25 DIAGNOSIS — N182 Chronic kidney disease, stage 2 (mild): Secondary | ICD-10-CM | POA: Diagnosis not present

## 2015-12-25 DIAGNOSIS — Z8781 Personal history of (healed) traumatic fracture: Secondary | ICD-10-CM

## 2015-12-25 DIAGNOSIS — D72829 Elevated white blood cell count, unspecified: Secondary | ICD-10-CM | POA: Diagnosis not present

## 2015-12-25 DIAGNOSIS — K224 Dyskinesia of esophagus: Secondary | ICD-10-CM

## 2015-12-25 DIAGNOSIS — G459 Transient cerebral ischemic attack, unspecified: Secondary | ICD-10-CM | POA: Diagnosis not present

## 2015-12-25 DIAGNOSIS — N3281 Overactive bladder: Secondary | ICD-10-CM

## 2015-12-25 DIAGNOSIS — D638 Anemia in other chronic diseases classified elsewhere: Secondary | ICD-10-CM

## 2015-12-25 DIAGNOSIS — J189 Pneumonia, unspecified organism: Secondary | ICD-10-CM | POA: Diagnosis not present

## 2015-12-25 DIAGNOSIS — J449 Chronic obstructive pulmonary disease, unspecified: Secondary | ICD-10-CM

## 2015-12-25 DIAGNOSIS — R748 Abnormal levels of other serum enzymes: Secondary | ICD-10-CM | POA: Diagnosis not present

## 2015-12-25 DIAGNOSIS — J9621 Acute and chronic respiratory failure with hypoxia: Secondary | ICD-10-CM

## 2015-12-25 DIAGNOSIS — E46 Unspecified protein-calorie malnutrition: Secondary | ICD-10-CM | POA: Diagnosis not present

## 2015-12-25 DIAGNOSIS — I2489 Other forms of acute ischemic heart disease: Secondary | ICD-10-CM

## 2015-12-25 DIAGNOSIS — I5032 Chronic diastolic (congestive) heart failure: Secondary | ICD-10-CM

## 2015-12-25 DIAGNOSIS — E785 Hyperlipidemia, unspecified: Secondary | ICD-10-CM

## 2015-12-25 LAB — LEGIONELLA PNEUMOPHILA SEROGP 1 UR AG: L. pneumophila Serogp 1 Ur Ag: NEGATIVE

## 2015-12-25 NOTE — Progress Notes (Signed)
LOCATION: [facility]  PCP: [name], MD   Code Status: Full Code  Goals of care: Advanced Directive information Advanced Directives 12/21/2015  Does patient have an advance directive? Yes  Type of Advance Directive Living will  Does patient want to make changes to advanced directive? No - Patient declined  Copy of advanced directive(s) in chart? No - copy requested  Would patient like information on creating an advanced directive? -       Extended Emergency Contact Information Primary Emergency Contact: Ra,Francis Address: [place]          [place], [place] [address] [place] Phone: [phone] Mobile Phone: [phone] Relation: Spouse Secondary Emergency Contact: Amey,[name] Address: [address]          [place], [place] [address] [place] Litter of [place] Work Phone: [phone] Mobile Phone: [phone] Relation: Son   No Known Allergies  Chief Complaint  Patient presents with  . Readmit To SNF    Readmission     HPI:  Patient is a 82 y.o. male seen today for short term rehabilitation post hospital admission from 12/21/15-12/23/15 with acute on chronic respiratory failure with HCAP and COPD exacerbation and sepsis. He was started on antibiotics and steroids. He had MAT and demand ischemia thought to be in setting of hypoxic respiratory failure. He had acute on chronic renal failure and responded well to iv fluids. He has PMH of HTN, HLD, Chronic Diastolic CHF, TIA, Stroke, Esophageal Stricture, CKD stage II, BPH among others.    Review of Systems:  Constitutional: Negative for fever, chills and diaphoresis.  HENT: Negative for headache, congestion, nasal discharge. Positive for difficulty swallowing.   Eyes: Negative for blurred vision, double vision and discharge.  Respiratory: Negative for cough, shortness of breath and wheezing.   Cardiovascular: Negative for chest pain, palpitations, leg swelling.    Gastrointestinal: Negative for heartburn, nausea,  abdominal pain. Positive for vomiting this morning. Last bowel movement was yesterday.  Genitourinary: Negative for dysuria Musculoskeletal: Negative for back pain, fall in the facility.  Skin: Negative for itching, rash.  Neurological: Negative for dizziness. Psychiatric/Behavioral: Negative for depression   Past Medical History  Diagnosis Date  . Hypertension   . Hyperlipidemia   . Tobacco abuse   . Lung nodule     Left lung, seen 2012, no change in 2012, no follow up needed   . PNA (pneumonia) 2010    Bilateral Pneumonia, COPD 37mm LLL Nodule   . History of ETT 09/1991     POS   . History of MRI 04-22-06    L/S- right hnp  l5/si   . B12 deficiency   . TIA (transient ischemic attack)   . GERD (gastroesophageal reflux disease)   . Esophageal stricture   . Hemorrhoids   . Colon polyps     hyperplastic  . Pneumonia   . Macular degeneration   . Stroke ([place])   . CKD (chronic kidney disease), stage II    Past Surgical History  Procedure Laterality Date  . Cystectomy  1995    Lumbar area   . Hip pinning,cannulated Left 03/28/2015    Procedure: CANNULATED HIP PINNING;  Surgeon: [name], MD;  Location: [facility];  Service: Orthopedics;  Laterality: Left;   Social History:   reports that he quit smoking about 30 years ago. His smoking use included Cigarettes. He has quit using smokeless tobacco. His smokeless tobacco use included Chew. He reports that he  does not drink alcohol or use illicit drugs.  Family History  Problem Relation Age of Onset  . Hypertension Mother   . Heart attack Father   . Colon cancer Neg Hx   . Esophageal cancer Neg Hx     Medications:   Medication List       This list is accurate as of: 12/25/15  4:07 PM.  Always use your most recent med list.               aspirin 81 MG EC tablet  Take 1 tablet (81 mg total) by mouth daily.     dextromethorphan-guaiFENesin 30-600 MG 12hr tablet   Commonly known as:  MUCINEX DM  Take 1 tablet by mouth 2 (two) times daily.     furosemide 20 MG tablet  Commonly known as:  LASIX  Take 1 tablet (20 mg total) by mouth daily.     HYDROcodone-acetaminophen 5-325 MG tablet  Commonly known as:  NORCO/VICODIN  Take 0.5-1 tablets by mouth every 6 (six) hours as needed for severe pain (if not better with tramadol.  sedation caution).     levalbuterol 0.63 MG/3ML nebulizer solution  Commonly known as:  XOPENEX  Take 3 mLs (0.63 mg total) by nebulization every 6 (six) hours as needed for wheezing or shortness of breath.     levalbuterol 1.25 MG/0.5ML nebulizer solution  Commonly known as:  XOPENEX  Take 1.25 mg by nebulization 3 (three) times daily.     levofloxacin 750 MG tablet  Commonly known as:  LEVAQUIN  Take 1 tablet (750 mg total) by mouth every other day.     metoprolol tartrate 25 MG tablet  Commonly known as:  LOPRESSOR  Take 0.5 tablets (12.5 mg total) by mouth 2 (two) times daily.     neomycin-polymyxin b-dexamethasone 3.5-10000-0.1 Oint  Commonly known as:  MAXITROL  Place 1 application into both eyes 2 (two) times daily.     oxybutynin 5 MG 24 hr tablet  Commonly known as:  DITROPAN-XL  Take 1 tablet (5 mg total) by mouth at bedtime. Reported on 12/17/2015     predniSONE 10 MG tablet  Commonly known as:  DELTASONE  Take 50 mg tablet 12/24/2015 and taper down by 10 mg daily until completed     simvastatin 20 MG tablet  Commonly known as:  ZOCOR  Take 1 tablet (20 mg total) by mouth daily.     tamsulosin 0.4 MG Caps capsule  Commonly known as:  FLOMAX  Take 1 capsule (0.4 mg total) by mouth daily.     tobramycin 0.3 % ophthalmic solution  Commonly known as:  TOBREX  Place 1 drop into both eyes 2 (two) times daily.     traMADol 50 MG tablet  Commonly known as:  ULTRAM  Take 1 tablet (50 mg total) by mouth every 12 (twelve) hours as needed (mild or moderate pain).        Immunizations: Immunization  History  Administered Date(s) Administered  . Influenza Split 05/31/2012  . Influenza Whole 05/28/2008, 06/18/2009, 04/26/2010  . Influenza,inj,Quad PF,36+ Mos 06/07/2013, 05/26/2014  . PPD Test 11/16/2015, 12/23/2015  . Pneumococcal Conjugate-13 12/01/2014  . Pneumococcal Polysaccharide-23 06/10/1998  . Td 06/10/1998, 05/31/2012  . Zoster 11/10/2010     Physical Exam:  Filed Vitals:   12/25/15 1559  BP: 128/74  Pulse: 90  Temp: 97.1 F (36.2 C)  TempSrc: Oral  Resp: 20  Height: 5\' 5"  (1.651 m)  Weight: 136 lb 12.8 oz (  62.052 kg)  SpO2: 98%   Body mass index is 22.76 kg/(m^2).  General- elderly male, frail, in no acute distress Head- normocephalic, atraumatic Throat- moist mucus membrane, missing teeth Eyes- PERRLA, EOMI, no pallor, no icterus, no discharge, normal conjunctiva, normal sclera Neck- no cervical lymphadenopathy Cardiovascular- normal s1,s2, no murmur, trace leg edema Respiratory- bilateral poor air movement, no wheeze, no rhonchi, no crackles, no use of accessory muscles Abdomen- bowel sounds present, soft, non tender Musculoskeletal- able to move all 4 extremities, generalized weakness, arthritis changes to his fingers Neurological- alert and oriented to person and place Skin- warm and dry, easy bruising Psychiatry- normal mood and affect    Labs reviewed: Basic Metabolic Panel:  Recent Labs  11/16/15 0457  12/21/15 0355 12/22/15 0231 12/23/15 12/23/15 0543  NA 138  < > 130* 132* 137 137  K 3.9  < > 3.3* 3.9  --  4.0  CL 96*  < > 86* 98*  --  102  CO2 32  < > 27 24  --  24  GLUCOSE 99  < > 116* 128*  --  108*  BUN 57*  < > 66* 57* 49* 49*  CREATININE 1.82*  < > 2.20* 1.74* 1.4* 1.44*  CALCIUM 8.4*  < > 8.0* 7.9*  --  8.0*  MG 1.9  --   --  1.7  --  1.8  < > = values in this interval not displayed. Liver Function Tests:  Recent Labs  12/21/15 0355 12/22/15 0231 12/23/15 0543  AST 57* 149* 116*  ALT 43 92* 112*  ALKPHOS 107 103 105    BILITOT 0.8 1.0 0.7  PROT 6.9 5.6* 5.3*  ALBUMIN 2.3* 1.8* 1.6*    Recent Labs  12/21/15 0637  LIPASE 15   No results for input(s): AMMONIA in the last 8760 hours. CBC:  Recent Labs  12/17/15 1322 12/21/15 0355 12/22/15 0231 12/23/15 12/23/15 0543  WBC 9.3 19.0* 10.6* 14.4 14.4*  NEUTROABS 6.8 16.9*  --   --  13.2*  HGB 12.1* 11.9* 9.3*  --  8.6*  HCT 37.0* 35.6* 28.1*  --  26.3*  MCV 80.6 78.6 78.3  --  78.3  PLT 324.0 264 192  --  237   Cardiac Enzymes:  Recent Labs  12/21/15 0637 12/21/15 1205 12/21/15 1821  TROPONINI 0.12* 0.09* 0.08*   BNP: Invalid input(s): POCBNP CBG:  Recent Labs  12/21/15 0726  GLUCAP 130*    Radiological Exams: Dg Chest Port 1 View  12/22/2015  CLINICAL DATA:  Pneumonia. EXAM: PORTABLE CHEST 1 VIEW COMPARISON:  12/21/2015. FINDINGS: Mediastinum and hilar structures normal. Cardiomegaly with normal pulmonary vascularity. Persistent right lower lobe infiltrate. New left lower lobe infiltrate. Bibasilar atelectasis. Small bilateral pleural effusions. Cardiomegaly with bilateral interstitial prominence. A component of congestive heart failure is most likely present. IMPRESSION: 1. Persistent right lower lobe infiltrate. New left lower lobe infiltrate. Small bilateral pleural effusions. 2. Cardiomegaly with interstitial prominence. A component of congestive heart failure is most likely present. Electronically Signed   By: Marcello Moores  Register   On: 12/22/2015 07:14   Dg Chest Port 1 View  12/21/2015  CLINICAL DATA:  Productive cough for 3 days.  Dyspnea EXAM: PORTABLE CHEST 1 VIEW COMPARISON:  11/12/2015 FINDINGS: Dense airspace disease at the right base consistent with pneumonia. Background COPD with emphysema. No cardiomegaly when accounting for apical fat pad and rotation. No effusion or pneumothorax. IMPRESSION: 1. Right lower lobe pneumonia. 2. Emphysema. Electronically Signed   By:  Monte Fantasia M.D.   On: 12/21/2015 04:18   Dg  Swallowing Func-speech Pathology  12/23/2015  Objective Swallowing Evaluation: Type of Study: MBS-Modified Barium Swallow Study Patient Details Name: [name] MRN: [id number] Date of Birth: 16-Mar-1925 Today's Date: 12/23/2015 Time: SLP Start Time (ACUTE ONLY): 1007-SLP Stop Time (ACUTE ONLY): 1023 SLP Time Calculation (min) (ACUTE ONLY): 16 min Past Medical History: Past Medical History Diagnosis Date . Hypertension  . Hyperlipidemia  . Tobacco abuse  . Lung nodule    Left lung, seen 2012, no change in 2012, no follow up needed  . PNA (pneumonia) 2010   Bilateral Pneumonia, COPD 13mm LLL Nodule  . History of ETT 09/1991    POS  . History of MRI 04-22-06   L/S- right hnp  l5/si  . B12 deficiency  . TIA (transient ischemic attack)  . GERD (gastroesophageal reflux disease)  . Esophageal stricture  . Hemorrhoids  . Colon polyps    hyperplastic . Pneumonia  . Macular degeneration  . Stroke ([place])  . CKD (chronic kidney disease), stage II  Past Surgical History: Past Surgical History Procedure Laterality Date . Cystectomy  1995   Lumbar area  . Hip pinning,cannulated Left 03/28/2015   Procedure: CANNULATED HIP PINNING;  Surgeon: [name], MD;  Location: [facility];  Service: Orthopedics;  Laterality: Left; HPI: 82 y.o. male with history of prior tobacco abuse, HTN, HLD, diastolic CHF, TIA, stroke, esophageal stricture, macular degeneration, CKD stage II, BPH, GERD, and possible COPD who presented with cough, fever and shortness of breath. Patient was hospitalized 3/16 > 11/16/15 for a CHF exacerbation.CXR showed right lower lobe infiltration. Pt has had several swallow evaluations (no MBS) by SLP over the past 2 years, all of which have recommended softer solids and thin liquids due to suspected primary esophageal issues. Subjective: pt alert, cooperative, denies any difficulties recently with swallowing Assessment / Plan / Recommendation CHL IP CLINICAL IMPRESSIONS 12/23/2015 Therapy Diagnosis Mild oral phase  dysphagia;Mild pharyngeal phase dysphagia Clinical Impression Pt has a mild oropharyngeal dysphagia with sensorimotor impairments, likely multifactorial in nature given respiratory status, h/o esopageal issues, and general deconditioning. Oral phase is slow and disorganized, with incomplete clearance of even soft solids. Delayed pharyngeal swallow resulted in silent aspiration of thin liquids before the swallow, with improved airway protection noted with nectar thick liqiuids and solids despite impaired timing. However, pt does have an inclination to take large straw sips, and shallow penetration did occur x1 with nectar thick liquids while he was attempting to clear moderate amount of solid residue from his mouth. This did trigger a reflexive cough that cleared penetrates. Recommend Dys 1 diet to facilitate oral clearance, continuing nectar thick liquids but with need for smaller sip size. Pt will benefit from continued SLP f/u both acutely and upon transition to SNF. Impact on safety and function Moderate aspiration risk   CHL IP TREATMENT RECOMMENDATION 12/23/2015 Treatment Recommendations Therapy as outlined in treatment plan below   Prognosis 12/23/2015 Prognosis for Safe Diet Advancement Fair Barriers to Reach Goals (No Data) Barriers/Prognosis Comment -- CHL IP DIET RECOMMENDATION 12/23/2015 SLP Diet Recommendations Dysphagia 1 (Puree) solids;Nectar thick liquid Liquid Administration via Cup;Straw Medication Administration Crushed with puree Compensations Slow rate;Small sips/bites;Follow solids with liquid Postural Changes Remain semi-upright after after feeds/meals (Comment);Seated upright at 90 degrees   CHL IP OTHER RECOMMENDATIONS 12/23/2015 Recommended Consults -- Oral Care Recommendations Oral care BID Other Recommendations Order thickener from pharmacy;Prohibited food (jello, ice cream, thin soups);Remove water pitcher  CHL IP FOLLOW UP RECOMMENDATIONS 12/23/2015 Follow up Recommendations Skilled Nursing  facility   [facility] IP FREQUENCY AND DURATION 12/23/2015 Speech Therapy Frequency (ACUTE ONLY) min 2x/week Treatment Duration 2 weeks      CHL IP ORAL PHASE 12/23/2015 Oral Phase Impaired Oral - Pudding Teaspoon -- Oral - Pudding Cup -- Oral - Honey Teaspoon -- Oral - Honey Cup -- Oral - Nectar Teaspoon -- Oral - Nectar Cup Reduced posterior propulsion;Decreased bolus cohesion;Delayed oral transit Oral - Nectar Straw Reduced posterior propulsion;Decreased bolus cohesion;Delayed oral transit Oral - Thin Teaspoon -- Oral - Thin Cup Reduced posterior propulsion;Decreased bolus cohesion;Delayed oral transit Oral - Thin Straw -- Oral - Puree Reduced posterior propulsion;Decreased bolus cohesion;Delayed oral transit Oral - Mech Soft Reduced posterior propulsion;Decreased bolus cohesion;Delayed oral transit;Impaired mastication;Lingual/palatal residue Oral - Regular -- Oral - Multi-Consistency -- Oral - Pill Reduced posterior propulsion;Decreased bolus cohesion;Delayed oral transit;Lingual/palatal residue Oral Phase - Comment --  CHL IP PHARYNGEAL PHASE 12/23/2015 Pharyngeal Phase Impaired Pharyngeal- Pudding Teaspoon -- Pharyngeal -- Pharyngeal- Pudding Cup -- Pharyngeal -- Pharyngeal- Honey Teaspoon -- Pharyngeal -- Pharyngeal- Honey Cup -- Pharyngeal -- Pharyngeal- Nectar Teaspoon -- Pharyngeal -- Pharyngeal- Nectar Cup Delayed swallow initiation-pyriform sinuses;Reduced tongue base retraction;Pharyngeal residue - valleculae Pharyngeal -- Pharyngeal- Nectar Straw Delayed swallow initiation-pyriform sinuses;Reduced tongue base retraction;Pharyngeal residue - valleculae;Penetration/Aspiration before swallow Pharyngeal Material enters airway, remains ABOVE vocal cords then ejected out Pharyngeal- Thin Teaspoon -- Pharyngeal -- Pharyngeal- Thin Cup Delayed swallow initiation-pyriform sinuses;Reduced tongue base retraction;Pharyngeal residue - valleculae;Penetration/Aspiration before swallow Pharyngeal Material enters airway, passes  BELOW cords without attempt by patient to eject out (silent aspiration) Pharyngeal- Thin Straw -- Pharyngeal -- Pharyngeal- Puree Reduced tongue base retraction;Pharyngeal residue - valleculae;Delayed swallow initiation-vallecula Pharyngeal -- Pharyngeal- Mechanical Soft Reduced tongue base retraction;Pharyngeal residue - valleculae;Delayed swallow initiation-vallecula Pharyngeal -- Pharyngeal- Regular -- Pharyngeal -- Pharyngeal- Multi-consistency -- Pharyngeal -- Pharyngeal- Pill Reduced tongue base retraction;Delayed swallow initiation-vallecula Pharyngeal -- Pharyngeal Comment --  CHL IP CERVICAL ESOPHAGEAL PHASE 12/23/2015 Cervical Esophageal Phase WFL Pudding Teaspoon -- Pudding Cup -- Honey Teaspoon -- Honey Cup -- Nectar Teaspoon -- Nectar Cup -- Nectar Straw -- Thin Teaspoon -- Thin Cup -- Thin Straw -- Puree -- Mechanical Soft -- Regular -- Multi-consistency -- Pill -- Cervical Esophageal Comment -- No flowsheet data found. Germain Osgood, M.A. CCC-SLP [phone] Germain Osgood 12/23/2015, 11:25 AM               Assessment/Plan  Generalized weakness From deconditioning. Will have him work with physical therapy and occupational therapy team to help with gait training and muscle strengthening exercises.fall precautions. Skin care. Encourage to be out of bed.   Acute on chronic respiratory failure In setting of HCAP and copd exacerbation. Continue levaquin until 12/31/15. Continue and complete course of prednisone taper. Monitor his breathing  HCAP Clinically improving, afebrile. contnue and complete course of levaquin  Copd Continue and complete tapering course of prednisone. Continue his bronchodilators. Continue mucinex  Deranged LFT Follow up LFT. Denies abdominal pain  Chronic diastolic chf Monitor his weight. Continue lasix 20 mg daily with metoprolol 12.5 mg bid, check bmp  Protein calorie malnutrition Monitor po intake. Monitor weight  Anemia of chronic disease Monitor  cbc  Leukocytosis Afebrile. Being treated for pneumonia and is on steroids. Monitor wbc and temp curve  Esophageal dysmotility Continue dysphagia diet with nectar thick liquid, aspiration precautions, SLP to follow   Demand ischemia Chest pain free. Continue aspirin and continue Metoprolol 12.5 mg BID  Compression fracture  Continue norco 5-325 mg 1/2-1 tab q6h prn pain  HLD continue Zocor   TIA on ASA and Zocor  BPH continue Flomax  CKD-stage II  Monitor bmp  OAB Continue ditropan    Goals of care: short term rehabilitation   Labs/tests ordered: cbc, cmp 12/28/15  Family/ staff Communication: reviewed care plan with patient and nursing supervisor    Blanchie Serve, MD Internal Medicine [place], [place] [address] Cell Phone (Monday-Friday 8 am - 5 pm): [phone] On Call: [phone] and follow prompts after 5 pm and on weekends Office Phone: [phone] Office Fax: [phone]

## 2015-12-26 LAB — CULTURE, BLOOD (ROUTINE X 2)
CULTURE: NO GROWTH
CULTURE: NO GROWTH

## 2015-12-29 LAB — BASIC METABOLIC PANEL
BUN: 30 mg/dL — AB (ref 4–21)
CREATININE: 1 mg/dL (ref 0.6–1.3)
GLUCOSE: 75 mg/dL
Potassium: 4.2 mmol/L (ref 3.4–5.3)
Sodium: 139 mmol/L (ref 137–147)

## 2015-12-29 LAB — HEPATIC FUNCTION PANEL
ALK PHOS: 100 U/L (ref 25–125)
ALT: 56 U/L — AB (ref 10–40)
AST: 28 U/L (ref 14–40)
BILIRUBIN, TOTAL: 0.6 mg/dL

## 2015-12-29 LAB — CBC AND DIFFERENTIAL
HEMATOCRIT: 32 % — AB (ref 41–53)
Hemoglobin: 10.3 g/dL — AB (ref 13.5–17.5)
PLATELETS: 543 10*3/uL — AB (ref 150–399)
WBC: 11.7 10*3/mL

## 2015-12-30 ENCOUNTER — Non-Acute Institutional Stay (SKILLED_NURSING_FACILITY): Payer: Medicare Other | Admitting: Family

## 2015-12-30 ENCOUNTER — Encounter: Payer: Self-pay | Admitting: Family

## 2015-12-30 DIAGNOSIS — E46 Unspecified protein-calorie malnutrition: Secondary | ICD-10-CM | POA: Diagnosis not present

## 2015-12-30 DIAGNOSIS — D638 Anemia in other chronic diseases classified elsewhere: Secondary | ICD-10-CM

## 2015-12-30 DIAGNOSIS — R600 Localized edema: Secondary | ICD-10-CM

## 2015-12-30 DIAGNOSIS — D72829 Elevated white blood cell count, unspecified: Secondary | ICD-10-CM

## 2015-12-30 NOTE — Progress Notes (Signed)
Location:  [facility] Room Number: 1206 Place of Service:  SNF [phone]) Provider:  Marlowe Sax, FNP-C  [name], MD  Patient Care Team: Tonia Ghent, MD as PCP - General  Extended Emergency Contact Information Primary Emergency Contact: [facility] Address: [address]          [place], [place] [address] Johnnette Litter of [place] Phone: [phone] Mobile Phone: [phone] Relation: Spouse Secondary Emergency Contact: Dario,[name] Address: [address]          [place],  [address] Johnnette Litter of [place] Work Phone: [phone] Mobile Phone: [phone] Relation: Son  Code Status: Full Code  Goals of care: Advanced Directive information Advanced Directives 12/21/2015  Does patient have an advance directive? Yes  Type of Advance Directive Living will  Does patient want to make changes to advanced directive? No - Patient declined  Copy of advanced directive(s) in chart? No - copy requested  Would patient like information on creating an advanced directive? -     Chief Complaint  Patient presents with  . Acute Visit    Follow up on Abnormal Labs    HPI:  Pt is a 82 y.o. male seen today at [facility] for an acute visit for evaluation of abnormal lab results. He is seen in his room today. He denies any acute issues. Facility Nurse reports no new concerns. His recent lab results showed TP 5.2, ALB 2.2, Ca 7.7, WBC 11.7, Hgb 10.3 ( 12/29/2015).    Past Medical History  Diagnosis Date  . Hypertension   . Hyperlipidemia   . Tobacco abuse   . Lung nodule     Left lung, seen 2012, no change in 2012, no follow up needed   . PNA (pneumonia) 2010    Bilateral Pneumonia, COPD 31mm LLL Nodule   . History of ETT 09/1991     POS   . History of MRI 04-22-06    L/S- right hnp  l5/si   . B12 deficiency   . TIA (transient ischemic attack)   . GERD (gastroesophageal reflux disease)   . Esophageal  stricture   . Hemorrhoids   . Colon polyps     hyperplastic  . Pneumonia   . Macular degeneration   . Stroke ([place])   . CKD (chronic kidney disease), stage II    Past Surgical History  Procedure Laterality Date  . Cystectomy  1995    Lumbar area   . Hip pinning,cannulated Left 03/28/2015    Procedure: CANNULATED HIP PINNING;  Surgeon: [name], MD;  Location: [facility];  Service: Orthopedics;  Laterality: Left;    No Known Allergies    Medication List       This list is accurate as of: 12/30/15 11:56 AM.  Always use your most recent med list.               aspirin 81 MG EC tablet  Take 1 tablet (81 mg total) by mouth daily.     furosemide 20 MG tablet  Commonly known as:  LASIX  Take 1 tablet (20 mg total) by mouth daily.     HYDROcodone-acetaminophen 5-325 MG tablet  Commonly known as:  NORCO/VICODIN  Take 0.5-1 tablets by mouth every 6 (six) hours as needed for severe pain (if not better with tramadol.  sedation caution).     levalbuterol 1.25 MG/0.5ML nebulizer solution  Commonly known as:  XOPENEX  Take 1.25 mg  by nebulization 3 (three) times daily.     levalbuterol 0.63 MG/3ML nebulizer solution  Commonly known as:  XOPENEX  Take 3 mLs (0.63 mg total) by nebulization every 6 (six) hours as needed for wheezing or shortness of breath.     levofloxacin 750 MG tablet  Commonly known as:  LEVAQUIN  Take 1 tablet (750 mg total) by mouth every other day.     metoprolol tartrate 25 MG tablet  Commonly known as:  LOPRESSOR  Take 0.5 tablets (12.5 mg total) by mouth 2 (two) times daily.     neomycin-polymyxin b-dexamethasone 3.5-10000-0.1 Oint  Commonly known as:  MAXITROL  Place 1 application into both eyes 2 (two) times daily.     oxybutynin 5 MG 24 hr tablet  Commonly known as:  DITROPAN-XL  Take 1 tablet (5 mg total) by mouth at bedtime. Reported on 12/17/2015     OXYGEN  Inhale 2 L into the lungs.     simvastatin 20 MG tablet  Commonly known as:  ZOCOR    Take 1 tablet (20 mg total) by mouth daily.     tamsulosin 0.4 MG Caps capsule  Commonly known as:  FLOMAX  Take 1 capsule (0.4 mg total) by mouth daily.     tobramycin 0.3 % ophthalmic solution  Commonly known as:  TOBREX  Place 1 drop into both eyes 2 (two) times daily.     traMADol 50 MG tablet  Commonly known as:  ULTRAM  Take 1 tablet (50 mg total) by mouth every 12 (twelve) hours as needed (mild or moderate pain).        Review of Systems  Constitutional: Negative for activity change, appetite change, chills, fatigue and fever.  HENT: Negative.   Eyes: Negative.   Respiratory: Negative for chest tightness, shortness of breath and wheezing.   Cardiovascular: Negative for chest pain, palpitations and leg swelling.  Gastrointestinal: Negative for abdominal distention, abdominal pain, constipation, diarrhea, nausea and vomiting.  Endocrine: Negative.   Genitourinary: Negative for dysuria, flank pain, hematuria and urgency.  Musculoskeletal: Positive for gait problem.  Skin: Negative.   Allergic/Immunologic: Negative.   Hematological: Negative.   Psychiatric/Behavioral: Negative.     Immunization History  Administered Date(s) Administered  . Influenza Split 05/31/2012  . Influenza Whole 05/28/2008, 06/18/2009, 04/26/2010  . Influenza,inj,Quad PF,36+ Mos 06/07/2013, 05/26/2014  . PPD Test 11/16/2015, 12/23/2015  . Pneumococcal Conjugate-13 12/01/2014  . Pneumococcal Polysaccharide-23 06/10/1998  . Td 06/10/1998, 05/31/2012  . Zoster 11/10/2010   Pertinent  Health Maintenance Due  Topic Date Due  . INFLUENZA VACCINE  03/29/2016  . PNA vac Low Risk Adult  Completed   Fall Risk  12/01/2014  Falls in the past year? No      Filed Vitals:   12/30/15 1127  BP: 122/78  Pulse: 62  Temp: 98.8 F (37.1 C)  Resp: 21  Height: 5\' 5"  (1.651 m)  Weight: 130 lb 12.8 oz (59.33 kg)  SpO2: 98%   Body mass index is 21.77 kg/(m^2). Physical Exam  Constitutional: No  distress.  Frail Elderly in no acute distress   HENT:  Head: Normocephalic.  Mouth/Throat: Oropharynx is clear and moist.  Eyes: Conjunctivae and EOM are normal. Pupils are equal, round, and reactive to light. Right eye exhibits no discharge. Left eye exhibits no discharge. No scleral icterus.  Neck: Normal range of motion. No JVD present. No thyromegaly present.  Cardiovascular: Normal rate, regular rhythm, normal heart sounds and intact distal pulses.  Exam reveals  no gallop and no friction rub.   No murmur heard. Pulmonary/Chest: Effort normal and breath sounds normal. No respiratory distress. He has no wheezes. He has no rales.  Abdominal: Soft. Bowel sounds are normal. He exhibits no distension and no mass. There is no tenderness. There is no rebound and no guarding.  Musculoskeletal: Normal range of motion. He exhibits no tenderness.  Unsteady gait. +1edema bilateral lower extremities. Decreased strength to lower extremities.   Lymphadenopathy:    He has no cervical adenopathy.  Neurological: He is alert.  Skin: Skin is warm and dry. No rash noted. No erythema. No pallor.  Psychiatric: He has a normal mood and affect.    Labs reviewed:  Recent Labs  11/16/15 0457  12/21/15 0355 12/22/15 0231 12/23/15 12/23/15 0543 12/29/15  NA 138  < > 130* 132* 137 137 139  K 3.9  < > 3.3* 3.9  --  4.0 4.2  CL 96*  < > 86* 98*  --  102  --   CO2 32  < > 27 24  --  24  --   GLUCOSE 99  < > 116* 128*  --  108*  --   BUN 57*  < > 66* 57* 49* 49* 30*  CREATININE 1.82*  < > 2.20* 1.74* 1.4* 1.44* 1.0  CALCIUM 8.4*  < > 8.0* 7.9*  --  8.0*  --   MG 1.9  --   --  1.7  --  1.8  --   < > = values in this interval not displayed.  Recent Labs  12/21/15 0355 12/22/15 0231 12/23/15 0543 12/29/15  AST 57* 149* 116* 28  ALT 43 92* 112* 56*  ALKPHOS 107 103 105 100  BILITOT 0.8 1.0 0.7  --   PROT 6.9 5.6* 5.3*  --   ALBUMIN 2.3* 1.8* 1.6*  --     Recent Labs  12/17/15 1322 12/21/15 0355  12/22/15 0231 12/23/15 12/23/15 0543 12/29/15  WBC 9.3 19.0* 10.6* 14.4 14.4* 11.7  NEUTROABS 6.8 16.9*  --   --  13.2*  --   HGB 12.1* 11.9* 9.3*  --  8.6* 10.3*  HCT 37.0* 35.6* 28.1*  --  26.3* 32*  MCV 80.6 78.6 78.3  --  78.3  --   PLT 324.0 264 192  --  237 543*   Lab Results  Component Value Date   TSH 1.93 10/20/2015   Lab Results  Component Value Date   HGBA1C 6.2* 12/22/2015   Lab Results  Component Value Date   CHOL 201* 08/12/2015   HDL 41 08/12/2015   LDLCALC 140* 08/12/2015   TRIG 98 08/12/2015   CHOLHDL 4.9 08/12/2015   Assessment/Plan Leukocytosis Afebrile. WBC 11.7 (12/29/2015) trending down post pneumonia.continue on Levaquin. Currently on Prednisone. Continue to monitor. Obtain CBC 12/29/2015.    Protein Malnutrition TP 5.2, ALB 2.2 ( 12/29/2015). RD consults for protein supplements. Monitor BMP   Edema  Bilateral lower extremities 1+ edema.No abrupt weight gain, no shortness of breath or wheezing.Continue with bilateral Knee high Ted hose.   Anemia   Hgb 10.3 ( 12/29/2015). Continue Ferrous sulfate. Monitor CBC.   Family/ staff Communication: Reviewed plan with patient and facility Nurse supervisor.   Labs/tests ordered:  CBC 01/04/2016

## 2016-01-04 ENCOUNTER — Encounter: Payer: Self-pay | Admitting: Family

## 2016-01-04 ENCOUNTER — Non-Acute Institutional Stay (SKILLED_NURSING_FACILITY): Payer: Medicare Other | Admitting: Family

## 2016-01-04 DIAGNOSIS — K219 Gastro-esophageal reflux disease without esophagitis: Secondary | ICD-10-CM

## 2016-01-04 LAB — CBC AND DIFFERENTIAL
HCT: 29 % — AB (ref 41–53)
Hemoglobin: 9.4 g/dL — AB (ref 13.5–17.5)
PLATELETS: 460 10*3/uL — AB (ref 150–399)
WBC: 7.9 10*3/mL

## 2016-01-04 NOTE — Progress Notes (Signed)
Location:  [facility] Room Number: 1206 Place of Service:  SNF [phone]) Provider:  Marlowe Sax, NP  [name], MD  Patient Care Team: Tonia Ghent, MD as PCP - General  Extended Emergency Contact Information Primary Emergency Contact: [facility] Address: [address]          [place], [place] [address] Johnnette Litter of [place] Phone: [phone] Mobile Phone: [phone] Relation: Spouse Secondary Emergency Contact: Cianci,[name] Address: [address]          [place], [place] [address] Johnnette Litter of [place] Work Phone: [phone] Mobile Phone: [phone] Relation: Son  Code Status:  Full code Goals of care: Advanced Directive information Advanced Directives 12/21/2015  Does patient have an advance directive? Yes  Type of Advance Directive Living will  Does patient want to make changes to advanced directive? No - Patient declined  Copy of advanced directive(s) in chart? No - copy requested  Would patient like information on creating an advanced directive? -     Chief Complaint  Patient presents with  . Acute Visit    Follow up on Acid Reflux    HPI:  Pt is a 82 y.o. male seen today at [facility] for an acute visit for follow up swallow studies results.He has a medical history of HTN, hyperlipidemia, lung nodule,TIA, Esophageal sticture among others. He is seen in his room today.His recent FEES swallow test done 12/30/15 indicated Larygopharyngeal reflux, AEB erythema/edema. Recommended mechanical soft diet with thin liquids, and No straws. He states recent heart burn treated with milk of magnesium with relief.   Past Medical History  Diagnosis Date  . Hypertension   . Hyperlipidemia   . Tobacco abuse   . Lung nodule     Left lung, seen 2012, no change in 2012, no follow up needed   . PNA (pneumonia) 2010    Bilateral Pneumonia, COPD 42mm LLL Nodule   . History of ETT 09/1991     POS   .  History of MRI 04-22-06    L/S- right hnp  l5/si   . B12 deficiency   . TIA (transient ischemic attack)   . GERD (gastroesophageal reflux disease)   . Esophageal stricture   . Hemorrhoids   . Colon polyps     hyperplastic  . Pneumonia   . Macular degeneration   . Stroke ([place])   . CKD (chronic kidney disease), stage II    Past Surgical History  Procedure Laterality Date  . Cystectomy  1995    Lumbar area   . Hip pinning,cannulated Left 03/28/2015    Procedure: CANNULATED HIP PINNING;  Surgeon: [name], MD;  Location: [facility];  Service: Orthopedics;  Laterality: Left;    No Known Allergies    Medication List       This list is accurate as of: 01/04/16 10:15 AM.  Always use your most recent med list.               albuterol 0.63 MG/3ML nebulizer solution  Commonly known as:  ACCUNEB  Take 1 ampule by nebulization every 6 (six) hours as needed for wheezing or shortness of breath.     aspirin 81 MG EC tablet  Take 1 tablet (81 mg total) by mouth daily.     furosemide 20 MG tablet  Commonly known as:  LASIX  Take 1 tablet (20 mg total) by mouth daily.     HYDROcodone-acetaminophen 5-325  MG tablet  Commonly known as:  NORCO/VICODIN  Take 0.5-1 tablets by mouth every 6 (six) hours as needed for severe pain (if not better with tramadol.  sedation caution).     levalbuterol 1.25 MG/0.5ML nebulizer solution  Commonly known as:  XOPENEX  Take 1.25 mg by nebulization 3 (three) times daily.     metoprolol tartrate 25 MG tablet  Commonly known as:  LOPRESSOR  Take 25 mg by mouth 2 (two) times daily. Hold if  HR < 60 or SBP < 110     neomycin-polymyxin b-dexamethasone 3.5-10000-0.1 Oint  Commonly known as:  MAXITROL  Place 1 application into both eyes 2 (two) times daily. Apply 5 minutes after eye drops are instilled     oxybutynin 5 MG 24 hr tablet  Commonly known as:  DITROPAN-XL  Take 1 tablet (5 mg total) by mouth at bedtime. Reported on 12/17/2015     OXYGEN    Inhale 2 L into the lungs.     PROCEL PO  2 scoops [place]cel by mouth twice daily.     simvastatin 20 MG tablet  Commonly known as:  ZOCOR  Take 1 tablet (20 mg total) by mouth daily.     tamsulosin 0.4 MG Caps capsule  Commonly known as:  FLOMAX  Take 1 capsule (0.4 mg total) by mouth daily.     tobramycin 0.3 % ophthalmic solution  Commonly known as:  TOBREX  Place 1 drop into both eyes 2 (two) times daily. Instill eye drops 5 mins before applying eye ointment     traMADol 50 MG tablet  Commonly known as:  ULTRAM  Take 1 tablet (50 mg total) by mouth every 12 (twelve) hours as needed (mild or moderate pain).     traZODone 25 mg Tabs tablet  Commonly known as:  DESYREL  Take 25 mg by mouth at bedtime. May repeat dose if not asleep in 1 - 2 hours        Review of Systems  Constitutional: Negative for fever, chills, activity change, appetite change and fatigue.  HENT: Negative for congestion, sinus pressure, sneezing and sore throat.   Respiratory: Negative for cough, shortness of breath and wheezing.   Gastrointestinal: Negative for nausea, vomiting, abdominal pain, diarrhea, constipation and abdominal distention.  Skin: Negative.   Psychiatric/Behavioral: Negative for hallucinations, confusion, sleep disturbance and agitation. The patient is not nervous/anxious.     Immunization History  Administered Date(s) Administered  . Influenza Split 05/31/2012  . Influenza Whole 05/28/2008, 06/18/2009, 04/26/2010  . Influenza,inj,Quad PF,36+ Mos 06/07/2013, 05/26/2014  . PPD Test 11/16/2015, 12/23/2015  . Pneumococcal Conjugate-13 12/01/2014  . Pneumococcal Polysaccharide-23 06/10/1998  . Td 06/10/1998, 05/31/2012  . Zoster 11/10/2010   Pertinent  Health Maintenance Due  Topic Date Due  . INFLUENZA VACCINE  03/29/2016  . PNA vac Low Risk Adult  Completed   Fall Risk  12/01/2014  Falls in the past year? No   Functional Status Survey:    Filed Vitals:   01/04/16 0947   BP: 128/99  Pulse: 64  Temp: 97.1 F (36.2 C)  Resp: 16  Height: 5\' 5"  (1.651 m)  Weight: 130 lb 12.8 oz (59.33 kg)  SpO2: 97%   Body mass index is 21.77 kg/(m^2). Physical Exam  Constitutional:  Thin Frail Elderly in no acute distress   HENT:  Head: Normocephalic.  Mouth/Throat: Oropharynx is clear and moist.  Eyes: Conjunctivae and EOM are normal. Pupils are equal, round, and reactive to light. Right eye  exhibits no discharge. Left eye exhibits no discharge. No scleral icterus.  Neck: Normal range of motion.  Cardiovascular: Normal rate, regular rhythm and intact distal pulses.  Exam reveals no gallop and no friction rub.   No murmur heard. Pulmonary/Chest: Effort normal and breath sounds normal. No respiratory distress. He has no wheezes. He has no rales.  Abdominal: Soft. Bowel sounds are normal. He exhibits no distension and no mass. There is no tenderness. There is no rebound and no guarding.  Lymphadenopathy:    He has no cervical adenopathy.  Neurological: He is alert.  Skin: Skin is warm and dry. No rash noted. No erythema. No pallor.  Psychiatric: He has a normal mood and affect.    Labs reviewed:  Recent Labs  11/16/15 0457  12/21/15 0355 12/22/15 0231 12/23/15 12/23/15 0543 12/29/15  NA 138  < > 130* 132* 137 137 139  K 3.9  < > 3.3* 3.9  --  4.0 4.2  CL 96*  < > 86* 98*  --  102  --   CO2 32  < > 27 24  --  24  --   GLUCOSE 99  < > 116* 128*  --  108*  --   BUN 57*  < > 66* 57* 49* 49* 30*  CREATININE 1.82*  < > 2.20* 1.74* 1.4* 1.44* 1.0  CALCIUM 8.4*  < > 8.0* 7.9*  --  8.0*  --   MG 1.9  --   --  1.7  --  1.8  --   < > = values in this interval not displayed.  Recent Labs  12/21/15 0355 12/22/15 0231 12/23/15 0543 12/29/15  AST 57* 149* 116* 28  ALT 43 92* 112* 56*  ALKPHOS 107 103 105 100  BILITOT 0.8 1.0 0.7  --   PROT 6.9 5.6* 5.3*  --   ALBUMIN 2.3* 1.8* 1.6*  --     Recent Labs  12/17/15 1322 12/21/15 0355 12/22/15 0231 12/23/15  12/23/15 0543 12/29/15  WBC 9.3 19.0* 10.6* 14.4 14.4* 11.7  NEUTROABS 6.8 16.9*  --   --  13.2*  --   HGB 12.1* 11.9* 9.3*  --  8.6* 10.3*  HCT 37.0* 35.6* 28.1*  --  26.3* 32*  MCV 80.6 78.6 78.3  --  78.3  --   PLT 324.0 264 192  --  237 543*   Lab Results  Component Value Date   TSH 1.93 10/20/2015   Lab Results  Component Value Date   HGBA1C 6.2* 12/22/2015   Lab Results  Component Value Date   CHOL 201* 08/12/2015   HDL 41 08/12/2015   LDLCALC 140* 08/12/2015   TRIG 98 08/12/2015   CHOLHDL 4.9 08/12/2015    Significant Diagnostic Results in last 30 days:  Dg Chest Port 1 View  12/22/2015  CLINICAL DATA:  Pneumonia. EXAM: PORTABLE CHEST 1 VIEW COMPARISON:  12/21/2015. FINDINGS: Mediastinum and hilar structures normal. Cardiomegaly with normal pulmonary vascularity. Persistent right lower lobe infiltrate. New left lower lobe infiltrate. Bibasilar atelectasis. Small bilateral pleural effusions. Cardiomegaly with bilateral interstitial prominence. A component of congestive heart failure is most likely present. IMPRESSION: 1. Persistent right lower lobe infiltrate. New left lower lobe infiltrate. Small bilateral pleural effusions. 2. Cardiomegaly with interstitial prominence. A component of congestive heart failure is most likely present. Electronically Signed   By: [place]   On: 12/22/2015 07:14   Dg Chest Port 1 View  12/21/2015  CLINICAL DATA:  Productive cough  for 3 days.  Dyspnea EXAM: PORTABLE CHEST 1 VIEW COMPARISON:  11/12/2015 FINDINGS: Dense airspace disease at the right base consistent with pneumonia. Background COPD with emphysema. No cardiomegaly when accounting for apical fat pad and rotation. No effusion or pneumothorax. IMPRESSION: 1. Right lower lobe pneumonia. 2. Emphysema. Electronically Signed   By: Monte Fantasia M.D.   On: 12/21/2015 04:18   Dg Swallowing Func-speech Pathology  12/23/2015  Objective Swallowing Evaluation: Type of Study: MBS-Modified  Barium Swallow Study Patient Details Name: JOVANI LABER MRN: [id number] Date of Birth: [birth year] Today's Date: 12/23/2015 Time: SLP Start Time (ACUTE ONLY): 1007-SLP Stop Time (ACUTE ONLY): 1023 SLP Time Calculation (min) (ACUTE ONLY): 16 min Past Medical History: Past Medical History Diagnosis Date . Hypertension  . Hyperlipidemia  . Tobacco abuse  . Lung nodule    Left lung, seen 2012, no change in 2012, no follow up needed  . PNA (pneumonia) 2010   Bilateral Pneumonia, COPD 73mm LLL Nodule  . History of ETT 09/1991    POS  . History of MRI 04-22-06   L/S- right hnp  l5/si  . B12 deficiency  . TIA (transient ischemic attack)  . GERD (gastroesophageal reflux disease)  . Esophageal stricture  . Hemorrhoids  . Colon polyps    hyperplastic . Pneumonia  . Macular degeneration  . Stroke ([place])  . CKD (chronic kidney disease), stage II  Past Surgical History: Past Surgical History Procedure Laterality Date . Cystectomy  1995   Lumbar area  . Hip pinning,cannulated Left 03/28/2015   Procedure: CANNULATED HIP PINNING;  Surgeon: [name], MD;  Location: [facility];  Service: Orthopedics;  Laterality: Left; HPI: 82 y.o. male with history of prior tobacco abuse, HTN, HLD, diastolic CHF, TIA, stroke, esophageal stricture, macular degeneration, CKD stage II, BPH, GERD, and possible COPD who presented with cough, fever and shortness of breath. Patient was hospitalized 3/16 > 11/16/15 for a CHF exacerbation.CXR showed right lower lobe infiltration. Pt has had several swallow evaluations (no MBS) by SLP over the past 2 years, all of which have recommended softer solids and thin liquids due to suspected primary esophageal issues. Subjective: pt alert, cooperative, denies any difficulties recently with swallowing Assessment / Plan / Recommendation CHL IP CLINICAL IMPRESSIONS 12/23/2015 Therapy Diagnosis Mild oral phase dysphagia;Mild pharyngeal phase dysphagia Clinical Impression Pt has a mild oropharyngeal dysphagia with sensorimotor  impairments, likely multifactorial in nature given respiratory status, h/o esopageal issues, and general deconditioning. Oral phase is slow and disorganized, with incomplete clearance of even soft solids. Delayed pharyngeal swallow resulted in silent aspiration of thin liquids before the swallow, with improved airway protection noted with nectar thick liqiuids and solids despite impaired timing. However, pt does have an inclination to take large straw sips, and shallow penetration did occur x1 with nectar thick liquids while he was attempting to clear moderate amount of solid residue from his mouth. This did trigger a reflexive cough that cleared penetrates. Recommend Dys 1 diet to facilitate oral clearance, continuing nectar thick liquids but with need for smaller sip size. Pt will benefit from continued SLP f/u both acutely and upon transition to SNF. Impact on safety and function Moderate aspiration risk   CHL IP TREATMENT RECOMMENDATION 12/23/2015 Treatment Recommendations Therapy as outlined in treatment plan below   Prognosis 12/23/2015 Prognosis for Safe Diet Advancement Fair Barriers to Reach Goals (No Data) Barriers/Prognosis Comment -- CHL IP DIET RECOMMENDATION 12/23/2015 SLP Diet Recommendations Dysphagia 1 (Puree) solids;Nectar thick liquid Liquid Administration via Cup;Straw  Medication Administration Crushed with puree Compensations Slow rate;Small sips/bites;Follow solids with liquid Postural Changes Remain semi-upright after after feeds/meals (Comment);Seated upright at 90 degrees   CHL IP OTHER RECOMMENDATIONS 12/23/2015 Recommended Consults -- Oral Care Recommendations Oral care BID Other Recommendations Order thickener from pharmacy;Prohibited food (jello, ice cream, thin soups);Remove water pitcher   CHL IP FOLLOW UP RECOMMENDATIONS 12/23/2015 Follow up Recommendations Skilled Nursing facility   [facility] IP FREQUENCY AND DURATION 12/23/2015 Speech Therapy Frequency (ACUTE ONLY) min 2x/week Treatment  Duration 2 weeks      CHL IP ORAL PHASE 12/23/2015 Oral Phase Impaired Oral - Pudding Teaspoon -- Oral - Pudding Cup -- Oral - Honey Teaspoon -- Oral - Honey Cup -- Oral - Nectar Teaspoon -- Oral - Nectar Cup Reduced posterior propulsion;Decreased bolus cohesion;Delayed oral transit Oral - Nectar Straw Reduced posterior propulsion;Decreased bolus cohesion;Delayed oral transit Oral - Thin Teaspoon -- Oral - Thin Cup Reduced posterior propulsion;Decreased bolus cohesion;Delayed oral transit Oral - Thin Straw -- Oral - Puree Reduced posterior propulsion;Decreased bolus cohesion;Delayed oral transit Oral - Mech Soft Reduced posterior propulsion;Decreased bolus cohesion;Delayed oral transit;Impaired mastication;Lingual/palatal residue Oral - Regular -- Oral - Multi-Consistency -- Oral - Pill Reduced posterior propulsion;Decreased bolus cohesion;Delayed oral transit;Lingual/palatal residue Oral Phase - Comment --  CHL IP PHARYNGEAL PHASE 12/23/2015 Pharyngeal Phase Impaired Pharyngeal- Pudding Teaspoon -- Pharyngeal -- Pharyngeal- Pudding Cup -- Pharyngeal -- Pharyngeal- Honey Teaspoon -- Pharyngeal -- Pharyngeal- Honey Cup -- Pharyngeal -- Pharyngeal- Nectar Teaspoon -- Pharyngeal -- Pharyngeal- Nectar Cup Delayed swallow initiation-pyriform sinuses;Reduced tongue base retraction;Pharyngeal residue - valleculae Pharyngeal -- Pharyngeal- Nectar Straw Delayed swallow initiation-pyriform sinuses;Reduced tongue base retraction;Pharyngeal residue - valleculae;Penetration/Aspiration before swallow Pharyngeal Material enters airway, remains ABOVE vocal cords then ejected out Pharyngeal- Thin Teaspoon -- Pharyngeal -- Pharyngeal- Thin Cup Delayed swallow initiation-pyriform sinuses;Reduced tongue base retraction;Pharyngeal residue - valleculae;Penetration/Aspiration before swallow Pharyngeal Material enters airway, passes BELOW cords without attempt by patient to eject out (silent aspiration) Pharyngeal- Thin Straw -- Pharyngeal  -- Pharyngeal- Puree Reduced tongue base retraction;Pharyngeal residue - valleculae;Delayed swallow initiation-vallecula Pharyngeal -- Pharyngeal- Mechanical Soft Reduced tongue base retraction;Pharyngeal residue - valleculae;Delayed swallow initiation-vallecula Pharyngeal -- Pharyngeal- Regular -- Pharyngeal -- Pharyngeal- Multi-consistency -- Pharyngeal -- Pharyngeal- Pill Reduced tongue base retraction;Delayed swallow initiation-vallecula Pharyngeal -- Pharyngeal Comment --  CHL IP CERVICAL ESOPHAGEAL PHASE 12/23/2015 Cervical Esophageal Phase WFL Pudding Teaspoon -- Pudding Cup -- Honey Teaspoon -- Honey Cup -- Nectar Teaspoon -- Nectar Cup -- Nectar Straw -- Thin Teaspoon -- Thin Cup -- Thin Straw -- Puree -- Mechanical Soft -- Regular -- Multi-consistency -- Pill -- Cervical Esophageal Comment -- No flowsheet data found. Germain Osgood, M.A. CCC-SLP [phone] Germain Osgood 12/23/2015, 11:25 AM               Assessment/Plan  GERD Recent FEES swallow test done 12/30/15 indicated Larygopharyngeal reflux, AEB erythema/edema. Recommended mechanical soft diet with thin liquids, and No straws. Continue Mechanical soft diet and thin liquids. Add omeprazole 20 mg Capsule one by mouth daily for reflux.will add H2 blocker if no relief. Aspiration Precautions.   Family/ staff Communication: Plan of care reviewed with patient and facility Nurse supervisor.  Labs/tests ordered:  None

## 2016-01-14 ENCOUNTER — Encounter: Payer: Self-pay | Admitting: Family

## 2016-01-14 ENCOUNTER — Non-Acute Institutional Stay (SKILLED_NURSING_FACILITY): Payer: Medicare Other | Admitting: Family

## 2016-01-14 DIAGNOSIS — I5032 Chronic diastolic (congestive) heart failure: Secondary | ICD-10-CM

## 2016-01-14 DIAGNOSIS — N400 Enlarged prostate without lower urinary tract symptoms: Secondary | ICD-10-CM | POA: Diagnosis not present

## 2016-01-14 DIAGNOSIS — I110 Hypertensive heart disease with heart failure: Secondary | ICD-10-CM | POA: Diagnosis not present

## 2016-01-14 DIAGNOSIS — K219 Gastro-esophageal reflux disease without esophagitis: Secondary | ICD-10-CM | POA: Diagnosis not present

## 2016-01-14 DIAGNOSIS — I4891 Unspecified atrial fibrillation: Secondary | ICD-10-CM | POA: Diagnosis not present

## 2016-01-15 NOTE — Progress Notes (Signed)
Patient ID: [name], male   DOB: [birth year], 82 y.o.   MRN: [id number]  Location:  [place] [facility] Room Number: 1102-B Place of Service:  SNF [phone]) Provider: Dinah Ngetich FNP-C Blanchie Serve, MD    [name], MD  Patient Care Team: Tonia Ghent, MD as PCP - General  Extended Emergency Contact Information Primary Emergency Contact: [facility] Address: [address]          [place], [place] [address] [name] of [place] Phone: [phone] Mobile Phone: [phone] Relation: Spouse Secondary Emergency Contact: [name],[name] Address: [address]          [place], [place] [address] [name] of [place] Work Phone: [phone] Mobile Phone: [phone] Relation: Son  Code Status: Full Code  Goals of care: Advanced Directive information Advanced Directives 12/21/2015  Does patient have an advance directive? Yes  Type of Advance Directive Living will  Does patient want to make changes to advanced directive? No - Patient declined  Copy of advanced directive(s) in chart? No - copy requested  Would patient like information on creating an advanced directive? -     Chief Complaint  Patient presents with  . Medical Management of Chronic Issues    Routine Visit    HPI:  Pt is a 82 y.o. male seen today at [facility]  for medical management of chronic diseases. He has a significant medical history of HTN, CHF, Afib,BPH, GERD CVA among others. He is seen today in his room. He denies any acute issue this visit. His states breathing  much from last HCAP. He still requires continuous oxygen via nasal cannula. Facility staff reports no new concerns.   Past Medical History  Diagnosis Date  . Hypertension   . Hyperlipidemia   . Tobacco abuse   . Lung nodule     Left lung, seen 2012, no change in 2012, no follow up needed   . PNA (pneumonia) 2010    Bilateral Pneumonia, COPD 62mm LLL Nodule   . History  of ETT 09/1991     POS   . History of MRI 04-22-06    L/S- right hnp  l5/si   . B12 deficiency   . TIA (transient ischemic attack)   . GERD (gastroesophageal reflux disease)   . Esophageal stricture   . Hemorrhoids   . Colon polyps     hyperplastic  . Pneumonia   . Macular degeneration   . Stroke ([place])   . CKD (chronic kidney disease), stage II    Past Surgical History  Procedure Laterality Date  . Cystectomy  1995    Lumbar area   . Hip pinning,cannulated Left 03/28/2015    Procedure: CANNULATED HIP PINNING;  Surgeon: [name], MD;  Location: [facility];  Service: Orthopedics;  Laterality: Left;    No Known Allergies    Medication List       This list is accurate as of: 01/14/16 11:59 PM.  Always use your most recent med list.               albuterol 0.63 MG/3ML nebulizer solution  Commonly known as:  ACCUNEB  Take 1 ampule by nebulization every 6 (six) hours as needed for wheezing or shortness of breath.     aspirin 81 MG EC tablet  Take 1 tablet (81 mg total) by mouth daily.     furosemide 20 MG tablet  Commonly known as:  LASIX  Take 1 tablet (20 mg total) by mouth daily.     HYDROcodone-acetaminophen 5-325 MG tablet  Commonly known as:  NORCO/VICODIN  Take 0.5-1 tablets by mouth every 6 (six) hours as needed for severe pain (if not better with tramadol.  sedation caution).     ketoconazole 2 % cream  Commonly known as:  NIZORAL  Apply 1 application topically 2 (two) times daily. Apply to buttock and groin for fungal rash     metoprolol tartrate 25 MG tablet  Commonly known as:  LOPRESSOR  Take 12.5 mg by mouth 2 (two) times daily. Hold if  HR < 60 or SBP < 110     neomycin-polymyxin b-dexamethasone 3.5-10000-0.1 Oint  Commonly known as:  MAXITROL  Place 1 application into both eyes 2 (two) times daily. Apply 5 minutes after eye drops are instilled     omeprazole 20 MG capsule  Commonly known as:  PRILOSEC  Take 20 mg by mouth daily.     oxybutynin 5  MG tablet  Commonly known as:  DITROPAN  Take 5 mg by mouth daily.     OXYGEN  Inhale 2 L into the lungs.     PROCEL PO  2 scoops Procel by mouth twice daily.     simvastatin 20 MG tablet  Commonly known as:  ZOCOR  Take 1 tablet (20 mg total) by mouth daily.     tamsulosin 0.4 MG Caps capsule  Commonly known as:  FLOMAX  Take 1 capsule (0.4 mg total) by mouth daily.     tobramycin 0.3 % ophthalmic solution  Commonly known as:  TOBREX  Place 1 drop into both eyes 2 (two) times daily. Instill eye drops 5 mins before applying eye ointment     traZODone 25 mg Tabs tablet  Commonly known as:  DESYREL  Take 25 mg by mouth at bedtime. May repeat dose if not asleep in 1 - 2 hours     zinc oxide 11.3 % Crea cream  Commonly known as:  BALMEX  Apply 1 application topically 2 (two) times daily. Apply over Nizoral cream 2 times daily        Review of Systems  Constitutional: Negative for fever, chills, activity change, appetite change and fatigue.  HENT: Negative for congestion, sinus pressure, sneezing and sore throat.   Eyes: Negative for pain, discharge, redness and itching.  Respiratory: Negative for cough, chest tightness, shortness of breath and wheezing.   Gastrointestinal: Negative for nausea, vomiting, abdominal pain, diarrhea, constipation and abdominal distention.  Endocrine: Negative.   Genitourinary: Negative for dysuria, urgency, frequency and flank pain.  Musculoskeletal: Positive for gait problem. Negative for back pain.  Skin: Negative.   Neurological: Negative for dizziness, seizures, light-headedness and headaches.  Hematological: Does not bruise/bleed easily.  Psychiatric/Behavioral: Negative for hallucinations, confusion, sleep disturbance and agitation. The patient is not nervous/anxious.     Immunization History  Administered Date(s) Administered  . Influenza Split 05/31/2012  . Influenza Whole 05/28/2008, 06/18/2009, 04/26/2010  . Influenza,inj,Quad  PF,36+ Mos 06/07/2013, 05/26/2014  . PPD Test 11/16/2015, 12/23/2015  . Pneumococcal Conjugate-13 12/01/2014  . Pneumococcal Polysaccharide-23 06/10/1998  . Td 06/10/1998, 05/31/2012  . Zoster 11/10/2010   Pertinent  Health Maintenance Due  Topic Date Due  . INFLUENZA VACCINE  03/29/2016  . PNA vac Low Risk Adult  Completed   Fall Risk  12/01/2014  Falls in the past year? No   Functional Status Survey:    Filed Vitals:   01/14/16 1022  BP: 143/65  Pulse: 62  Temp: 98.9 F (37.2 C)  TempSrc: Oral  Height: 5\' 5"  (1.651 m)  Weight: 130 lb 12.8 oz (59.33 kg)  SpO2: 99%   Body mass index is 21.77 kg/(m^2). Physical Exam  Constitutional: He appears well-developed and well-nourished.  Elderly in no acute distress  HENT:  Head: Normocephalic.  Mouth/Throat: Oropharynx is clear and moist.  Eyes: Conjunctivae and EOM are normal. Pupils are equal, round, and reactive to light. Right eye exhibits no discharge. Left eye exhibits no discharge. No scleral icterus.  Neck: Normal range of motion. No JVD present.  Cardiovascular: Normal rate, regular rhythm, normal heart sounds and intact distal pulses.  Exam reveals no gallop and no friction rub.   No murmur heard. Pulmonary/Chest: Effort normal and breath sounds normal. No respiratory distress. He has no wheezes. He has no rales.  Oxygen 2 liter nasal cannula   Abdominal: Soft. Bowel sounds are normal. He exhibits no distension. There is no tenderness. There is no rebound and no guarding.  Musculoskeletal: He exhibits no edema or tenderness.  Bilateral lower extremities weakness.   Lymphadenopathy:    He has no cervical adenopathy.  Neurological: He is alert.  Skin: Skin is warm and dry. No rash noted. No erythema. No pallor.  Psychiatric: He has a normal mood and affect.    Labs reviewed:  Recent Labs  11/16/15 0457  12/21/15 0355 12/22/15 0231 12/23/15 12/23/15 0543 12/29/15  NA 138  < > 130* 132* 137 137 139  K 3.9  < >  3.3* 3.9  --  4.0 4.2  CL 96*  < > 86* 98*  --  102  --   CO2 32  < > 27 24  --  24  --   GLUCOSE 99  < > 116* 128*  --  108*  --   BUN 57*  < > 66* 57* 49* 49* 30*  CREATININE 1.82*  < > 2.20* 1.74* 1.4* 1.44* 1.0  CALCIUM 8.4*  < > 8.0* 7.9*  --  8.0*  --   MG 1.9  --   --  1.7  --  1.8  --   < > = values in this interval not displayed.  Recent Labs  12/21/15 0355 12/22/15 0231 12/23/15 0543 12/29/15  AST 57* 149* 116* 28  ALT 43 92* 112* 56*  ALKPHOS 107 103 105 100  BILITOT 0.8 1.0 0.7  --   PROT 6.9 5.6* 5.3*  --   ALBUMIN 2.3* 1.8* 1.6*  --     Recent Labs  12/17/15 1322 12/21/15 0355 12/22/15 0231 12/23/15 12/23/15 0543 12/29/15  WBC 9.3 19.0* 10.6* 14.4 14.4* 11.7  NEUTROABS 6.8 16.9*  --   --  13.2*  --   HGB 12.1* 11.9* 9.3*  --  8.6* 10.3*  HCT 37.0* 35.6* 28.1*  --  26.3* 32*  MCV 80.6 78.6 78.3  --  78.3  --   PLT 324.0 264 192  --  237 543*   Lab Results  Component Value Date   TSH 1.93 10/20/2015   Lab Results  Component Value Date   HGBA1C 6.2* 12/22/2015   Lab Results  Component Value Date   CHOL 201* 08/12/2015   HDL 41 08/12/2015   LDLCALC 140* 08/12/2015   TRIG 98 08/12/2015   CHOLHDL 4.9 08/12/2015    Significant Diagnostic Results in last 30 days:  Dg Chest Port 1 View  12/22/2015  CLINICAL DATA:  Pneumonia. EXAM: PORTABLE  CHEST 1 VIEW COMPARISON:  12/21/2015. FINDINGS: Mediastinum and hilar structures normal. Cardiomegaly with normal pulmonary vascularity. Persistent right lower lobe infiltrate. New left lower lobe infiltrate. Bibasilar atelectasis. Small bilateral pleural effusions. Cardiomegaly with bilateral interstitial prominence. A component of congestive heart failure is most likely present. IMPRESSION: 1. Persistent right lower lobe infiltrate. New left lower lobe infiltrate. Small bilateral pleural effusions. 2. Cardiomegaly with interstitial prominence. A component of congestive heart failure is most likely present.  Electronically Signed   By: Marcello Moores  Register   On: 12/22/2015 07:14   Dg Chest Port 1 View  12/21/2015  CLINICAL DATA:  Productive cough for 3 days.  Dyspnea EXAM: PORTABLE CHEST 1 VIEW COMPARISON:  11/12/2015 FINDINGS: Dense airspace disease at the right base consistent with pneumonia. Background COPD with emphysema. No cardiomegaly when accounting for apical fat pad and rotation. No effusion or pneumothorax. IMPRESSION: 1. Right lower lobe pneumonia. 2. Emphysema. Electronically Signed   By: Monte Fantasia M.D.   On: 12/21/2015 04:18   Dg Swallowing Func-speech Pathology  12/23/2015  Objective Swallowing Evaluation: Type of Study: MBS-Modified Barium Swallow Study Patient Details Name: [name] MRN: [id number] Date of Birth: 1924-09-02 Today's Date: 12/23/2015 Time: SLP Start Time (ACUTE ONLY): 1007-SLP Stop Time (ACUTE ONLY): 1023 SLP Time Calculation (min) (ACUTE ONLY): 16 min Past Medical History: Past Medical History Diagnosis Date . Hypertension  . Hyperlipidemia  . Tobacco abuse  . Lung nodule    Left lung, seen 2012, no change in 2012, no follow up needed  . PNA (pneumonia) 2010   Bilateral Pneumonia, COPD 46mm LLL Nodule  . History of ETT 09/1991    POS  . History of MRI 04-22-06   L/S- right hnp  l5/si  . B12 deficiency  . TIA (transient ischemic attack)  . GERD (gastroesophageal reflux disease)  . Esophageal stricture  . Hemorrhoids  . Colon polyps    hyperplastic . Pneumonia  . Macular degeneration  . Stroke ([place])  . CKD (chronic kidney disease), stage II  Past Surgical History: Past Surgical History Procedure Laterality Date . Cystectomy  1995   Lumbar area  . Hip pinning,cannulated Left 03/28/2015   Procedure: CANNULATED HIP PINNING;  Surgeon: [name], MD;  Location: [facility];  Service: Orthopedics;  Laterality: Left; HPI: 82 y.o. male with history of prior tobacco abuse, HTN, HLD, diastolic CHF, TIA, stroke, esophageal stricture, macular degeneration, CKD stage II, BPH, GERD, and possible  COPD who presented with cough, fever and shortness of breath. Patient was hospitalized 3/16 > 11/16/15 for a CHF exacerbation.CXR showed right lower lobe infiltration. Pt has had several swallow evaluations (no MBS) by SLP over the past 2 years, all of which have recommended softer solids and thin liquids due to suspected primary esophageal issues. Subjective: pt alert, cooperative, denies any difficulties recently with swallowing Assessment / Plan / Recommendation CHL IP CLINICAL IMPRESSIONS 12/23/2015 Therapy Diagnosis Mild oral phase dysphagia;Mild pharyngeal phase dysphagia Clinical Impression Pt has a mild oropharyngeal dysphagia with sensorimotor impairments, likely multifactorial in nature given respiratory status, h/o esopageal issues, and general deconditioning. Oral phase is slow and disorganized, with incomplete clearance of even soft solids. Delayed pharyngeal swallow resulted in silent aspiration of thin liquids before the swallow, with improved airway protection noted with nectar thick liqiuids and solids despite impaired timing. However, pt does have an inclination to take large straw sips, and shallow penetration did occur x1 with nectar thick liquids while he was attempting to clear moderate amount of solid  residue from his mouth. This did trigger a reflexive cough that cleared penetrates. Recommend Dys 1 diet to facilitate oral clearance, continuing nectar thick liquids but with need for smaller sip size. Pt will benefit from continued SLP f/u both acutely and upon transition to SNF. Impact on safety and function Moderate aspiration risk   CHL IP TREATMENT RECOMMENDATION 12/23/2015 Treatment Recommendations Therapy as outlined in treatment plan below   Prognosis 12/23/2015 Prognosis for Safe Diet Advancement Fair Barriers to Reach Goals (No Data) Barriers/Prognosis Comment -- CHL IP DIET RECOMMENDATION 12/23/2015 SLP Diet Recommendations Dysphagia 1 (Puree) solids;Nectar thick liquid Liquid  Administration via Cup;Straw Medication Administration Crushed with puree Compensations Slow rate;Small sips/bites;Follow solids with liquid Postural Changes Remain semi-upright after after feeds/meals (Comment);Seated upright at 90 degrees   CHL IP OTHER RECOMMENDATIONS 12/23/2015 Recommended Consults -- Oral Care Recommendations Oral care BID Other Recommendations Order thickener from pharmacy;Prohibited food (jello, ice cream, thin soups);Remove water pitcher   CHL IP FOLLOW UP RECOMMENDATIONS 12/23/2015 Follow up Recommendations Skilled Nursing facility   [facility] IP FREQUENCY AND DURATION 12/23/2015 Speech Therapy Frequency (ACUTE ONLY) min 2x/week Treatment Duration 2 weeks      CHL IP ORAL PHASE 12/23/2015 Oral Phase Impaired Oral - Pudding Teaspoon -- Oral - Pudding Cup -- Oral - Honey Teaspoon -- Oral - Honey Cup -- Oral - Nectar Teaspoon -- Oral - Nectar Cup Reduced posterior propulsion;Decreased bolus cohesion;Delayed oral transit Oral - Nectar Straw Reduced posterior propulsion;Decreased bolus cohesion;Delayed oral transit Oral - Thin Teaspoon -- Oral - Thin Cup Reduced posterior propulsion;Decreased bolus cohesion;Delayed oral transit Oral - Thin Straw -- Oral - Puree Reduced posterior propulsion;Decreased bolus cohesion;Delayed oral transit Oral - Mech Soft Reduced posterior propulsion;Decreased bolus cohesion;Delayed oral transit;Impaired mastication;Lingual/palatal residue Oral - Regular -- Oral - Multi-Consistency -- Oral - Pill Reduced posterior propulsion;Decreased bolus cohesion;Delayed oral transit;Lingual/palatal residue Oral Phase - Comment --  CHL IP PHARYNGEAL PHASE 12/23/2015 Pharyngeal Phase Impaired Pharyngeal- Pudding Teaspoon -- Pharyngeal -- Pharyngeal- Pudding Cup -- Pharyngeal -- Pharyngeal- Honey Teaspoon -- Pharyngeal -- Pharyngeal- Honey Cup -- Pharyngeal -- Pharyngeal- Nectar Teaspoon -- Pharyngeal -- Pharyngeal- Nectar Cup Delayed swallow initiation-pyriform sinuses;Reduced tongue base  retraction;Pharyngeal residue - valleculae Pharyngeal -- Pharyngeal- Nectar Straw Delayed swallow initiation-pyriform sinuses;Reduced tongue base retraction;Pharyngeal residue - valleculae;Penetration/Aspiration before swallow Pharyngeal Material enters airway, remains ABOVE vocal cords then ejected out Pharyngeal- Thin Teaspoon -- Pharyngeal -- Pharyngeal- Thin Cup Delayed swallow initiation-pyriform sinuses;Reduced tongue base retraction;Pharyngeal residue - valleculae;Penetration/Aspiration before swallow Pharyngeal Material enters airway, passes BELOW cords without attempt by patient to eject out (silent aspiration) Pharyngeal- Thin Straw -- Pharyngeal -- Pharyngeal- Puree Reduced tongue base retraction;Pharyngeal residue - valleculae;Delayed swallow initiation-vallecula Pharyngeal -- Pharyngeal- Mechanical Soft Reduced tongue base retraction;Pharyngeal residue - valleculae;Delayed swallow initiation-vallecula Pharyngeal -- Pharyngeal- Regular -- Pharyngeal -- Pharyngeal- Multi-consistency -- Pharyngeal -- Pharyngeal- Pill Reduced tongue base retraction;Delayed swallow initiation-vallecula Pharyngeal -- Pharyngeal Comment --  CHL IP CERVICAL ESOPHAGEAL PHASE 12/23/2015 Cervical Esophageal Phase WFL Pudding Teaspoon -- Pudding Cup -- Honey Teaspoon -- Honey Cup -- Nectar Teaspoon -- Nectar Cup -- Nectar Straw -- Thin Teaspoon -- Thin Cup -- Thin Straw -- Puree -- Mechanical Soft -- Regular -- Multi-consistency -- Pill -- Cervical Esophageal Comment -- No flowsheet data found. Germain Osgood, M.A. CCC-SLP [phone] Germain Osgood 12/23/2015, 11:25 AM               Assessment/Plan 1. Hypertensive heart disease with CHF (congestive heart failure) (HCC) B/p stable. Continue Metoprolol 25 mg Tablet. Monitor  BMP  2. Chronic diastolic CHF (congestive heart failure) (HCC) Stable. No weight gain, edema shortness of breath, wheezing or cough. Continue cont oxygen 2 liters via nasal cannula. Continue on  diuretic.   3. Atrial fibrillation with RVR (HCC) HR controlled on metoprolol.   4. BPH (benign prostatic hyperplasia) Continue on Tamsulosin 0.4 mg Capsule.   5. Gastroesophageal reflux disease without esophagitis Continue on Omeprazole 20 mg capsule.     Family/ staff Communication: reviewed plan of care with patient and facility Nurse supervisor.  Labs/tests ordered: None

## 2016-01-18 LAB — HEPATIC FUNCTION PANEL
ALK PHOS: 105 U/L (ref 25–125)
ALT: 18 U/L (ref 10–40)
AST: 16 U/L (ref 14–40)
Bilirubin, Direct: 0.03 mg/dL (ref 0.01–0.4)
Bilirubin, Total: 0.3 mg/dL

## 2016-01-18 LAB — LIPID PANEL
Cholesterol: 123 mg/dL (ref 0–200)
HDL: 31 mg/dL — AB (ref 35–70)
LDL Cholesterol: 75 mg/dL
Triglycerides: 86 mg/dL (ref 40–160)

## 2016-02-02 ENCOUNTER — Encounter: Payer: Self-pay | Admitting: Family

## 2016-02-02 ENCOUNTER — Non-Acute Institutional Stay (SKILLED_NURSING_FACILITY): Payer: Medicare Other | Admitting: Family

## 2016-02-02 DIAGNOSIS — W19XXXA Unspecified fall, initial encounter: Secondary | ICD-10-CM

## 2016-02-02 DIAGNOSIS — R269 Unspecified abnormalities of gait and mobility: Secondary | ICD-10-CM

## 2016-02-02 DIAGNOSIS — IMO0001 Reserved for inherently not codable concepts without codable children: Secondary | ICD-10-CM

## 2016-02-02 NOTE — Progress Notes (Signed)
Location:  [facility] Room Number: [id number] Place of Service:  SNF [phone]) Provider:  Marlowe Sax, NP  [name], MD  Patient Care Team: Tonia Ghent, MD as PCP - General  Extended Emergency Contact Information Primary Emergency Contact: [facility] Address: [address]          [place], [place] [address] [name] of [place] Phone: [phone] Mobile Phone: [phone] Relation: Spouse Secondary Emergency Contact: Somero,[name] Address: [address]          [place], [place] [address] [name] of [place] Work Phone: [phone] Mobile Phone: [phone] Relation: Son  Code Status:  Full Code Goals of care: Advanced Directive information Advanced Directives 02/02/2016  Does patient have an advance directive? No  Type of Advance Directive -  Does patient want to make changes to advanced directive? -  Copy of advanced directive(s) in chart? -  Would patient like information on creating an advanced directive? -     Chief Complaint  Patient presents with  . Acute Visit    Acute    HPI:  Pt is a 82 y.o. male seen today at [facility] for an acute visit for follow up fall episode. He is seen in his room today per facility Nurse request. Facility Nurse reports that patient was found on the floor next to his bed in front of his wheelchair. Patient stated that he slipped and sat on the floor attempting to get up without any assistance. He is confused. No injury sustained. He denies any acute issues.    Past Medical History  Diagnosis Date  . Hypertension   . Hyperlipidemia   . Tobacco abuse   . Lung nodule     Left lung, seen 2012, no change in 2012, no follow up needed   . PNA (pneumonia) 2010    Bilateral Pneumonia, COPD 72mm LLL Nodule   . History of ETT 09/1991     POS   . History of MRI 04-22-06    L/S- right hnp  l5/si   . B12 deficiency   . TIA (transient ischemic attack)   . GERD  (gastroesophageal reflux disease)   . Esophageal stricture   . Hemorrhoids   . Colon polyps     hyperplastic  . Pneumonia   . Macular degeneration   . Stroke ([place])   . CKD (chronic kidney disease), stage II    Past Surgical History  Procedure Laterality Date  . Cystectomy  1995    Lumbar area   . Hip pinning,cannulated Left 03/28/2015    Procedure: CANNULATED HIP PINNING;  Surgeon: [name], MD;  Location: [facility];  Service: Orthopedics;  Laterality: Left;    No Known Allergies    Medication List       This list is accurate as of: 02/02/16  9:44 AM.  Always use your most recent med list.               albuterol 0.63 MG/3ML nebulizer solution  Commonly known as:  ACCUNEB  Take 1 ampule by nebulization every 6 (six) hours as needed for wheezing or shortness of breath.     aspirin 81 MG EC tablet  Take 1 tablet (81 mg total) by mouth daily.     furosemide 20 MG tablet  Commonly known as:  LASIX  Take 1 tablet (20 mg total) by mouth daily.     HYDROcodone-acetaminophen 5-325 MG tablet  Commonly known  as:  NORCO/VICODIN  Take 0.5-1 tablets by mouth every 6 (six) hours as needed for severe pain (if not better with tramadol.  sedation caution).     ketoconazole 2 % cream  Commonly known as:  NIZORAL  Apply 1 application topically 2 (two) times daily. Apply to buttock and groin for fungal rash     metoprolol tartrate 25 MG tablet  Commonly known as:  LOPRESSOR  Take 12.5 mg by mouth 2 (two) times daily. Hold if  HR < 60 or SBP < 110     omeprazole 20 MG capsule  Commonly known as:  PRILOSEC  Take 20 mg by mouth daily.     oxybutynin 5 MG tablet  Commonly known as:  DITROPAN  Take 5 mg by mouth daily.     OXYGEN  Inhale 2 L into the lungs.     PROCEL PO  2 scoops Procel by mouth twice daily.     simvastatin 20 MG tablet  Commonly known as:  ZOCOR  Take 1 tablet (20 mg total) by mouth daily.     tamsulosin 0.4 MG Caps capsule  Commonly known as:  FLOMAX    Take 1 capsule (0.4 mg total) by mouth daily.     traMADol 50 MG tablet  Commonly known as:  ULTRAM  Take 50 mg by mouth every 12 (twelve) hours as needed for moderate pain.     traZODone 25 mg Tabs tablet  Commonly known as:  DESYREL  Take 25 mg by mouth at bedtime. May repeat dose if not asleep in 1 - 2 hours     zinc oxide 11.3 % Crea cream  Commonly known as:  BALMEX  Apply 1 application topically 2 (two) times daily. Apply over Nizoral cream 2 times daily        Review of Systems  Constitutional: Negative for fever, chills, activity change, appetite change and fatigue.  HENT: Negative for congestion, sinus pressure, sneezing and sore throat.   Eyes: Negative for pain, discharge, redness and itching.  Respiratory: Negative for cough, chest tightness, shortness of breath and wheezing.   Gastrointestinal: Negative for nausea, vomiting, abdominal pain, diarrhea, constipation and abdominal distention.  Endocrine: Negative.   Genitourinary: Negative for dysuria, urgency, frequency and flank pain.  Musculoskeletal: Positive for gait problem. Negative for back pain.  Skin: Negative.   Neurological: Negative for dizziness, seizures, light-headedness and headaches.  Hematological: Does not bruise/bleed easily.  Psychiatric/Behavioral: Negative for hallucinations, confusion, sleep disturbance and agitation. The patient is not nervous/anxious.     Immunization History  Administered Date(s) Administered  . Influenza Split 05/31/2012  . Influenza Whole 05/28/2008, 06/18/2009, 04/26/2010  . Influenza,inj,Quad PF,36+ Mos 06/07/2013, 05/26/2014  . PPD Test 11/16/2015, 12/23/2015  . Pneumococcal Conjugate-13 12/01/2014  . Pneumococcal Polysaccharide-23 06/10/1998  . Td 06/10/1998, 05/31/2012  . Zoster 11/10/2010   Pertinent  Health Maintenance Due  Topic Date Due  . INFLUENZA VACCINE  03/29/2016  . PNA vac Low Risk Adult  Completed   Fall Risk  12/01/2014  Falls in the past year?  No   Functional Status Survey:    Filed Vitals:   02/02/16 0921  BP: 147/70  Pulse: 64  Temp: 96.2 F (35.7 C)  Resp: 18  Height: 5\' 5"  (1.651 m)  Weight: 129 lb (58.514 kg)  SpO2: 96%   Body mass index is 21.47 kg/(m^2). Physical Exam  Constitutional: He appears well-developed and well-nourished.  Elderly in no acute distress. Pleasantly confused.   HENT:  Head: Normocephalic.  Mouth/Throat: Oropharynx is clear and moist.  Eyes: Conjunctivae and EOM are normal. Pupils are equal, round, and reactive to light. Right eye exhibits no discharge. Left eye exhibits no discharge. No scleral icterus.  Neck: Normal range of motion. No JVD present.  Cardiovascular: Normal rate, regular rhythm, normal heart sounds and intact distal pulses.  Exam reveals no gallop and no friction rub.   No murmur heard. Pulmonary/Chest: Effort normal and breath sounds normal. No respiratory distress. He has no wheezes. He has no rales.  Oxygen 2 liter nasal cannula   Abdominal: Soft. Bowel sounds are normal. He exhibits no distension. There is no tenderness. There is no rebound and no guarding.  Musculoskeletal: He exhibits no edema or tenderness.  Unsteady gait. Bilateral lower extremities weakness. Self propel on wheelchair  Lymphadenopathy:    He has no cervical adenopathy.  Neurological: He is alert.  Skin: Skin is warm and dry. No rash noted. No erythema. No pallor.  Psychiatric: He has a normal mood and affect.    Labs reviewed:  Recent Labs  11/16/15 0457  12/21/15 0355 12/22/15 0231 12/23/15 12/23/15 0543 12/29/15  NA 138  < > 130* 132* 137 137 139  K 3.9  < > 3.3* 3.9  --  4.0 4.2  CL 96*  < > 86* 98*  --  102  --   CO2 32  < > 27 24  --  24  --   GLUCOSE 99  < > 116* 128*  --  108*  --   BUN 57*  < > 66* 57* 49* 49* 30*  CREATININE 1.82*  < > 2.20* 1.74* 1.4* 1.44* 1.0  CALCIUM 8.4*  < > 8.0* 7.9*  --  8.0*  --   MG 1.9  --   --  1.7  --  1.8  --   < > = values in this interval  not displayed.  Recent Labs  12/21/15 0355 12/22/15 0231 12/23/15 0543 12/29/15 01/18/16  AST 57* 149* 116* 28 16  ALT 43 92* 112* 56* 18  ALKPHOS 107 103 105 100 105  BILITOT 0.8 1.0 0.7  --   --   PROT 6.9 5.6* 5.3*  --   --   ALBUMIN 2.3* 1.8* 1.6*  --   --     Recent Labs  12/17/15 1322 12/21/15 0355 12/22/15 0231  12/23/15 0543 12/29/15 01/04/16  WBC 9.3 19.0* 10.6*  < > 14.4* 11.7 7.9  NEUTROABS 6.8 16.9*  --   --  13.2*  --   --   HGB 12.1* 11.9* 9.3*  --  8.6* 10.3* 9.4*  HCT 37.0* 35.6* 28.1*  --  26.3* 32* 29*  MCV 80.6 78.6 78.3  --  78.3  --   --   PLT 324.0 264 192  --  237 543* 460*  < > = values in this interval not displayed. Lab Results  Component Value Date   TSH 1.93 10/20/2015   Lab Results  Component Value Date   HGBA1C 6.2* 12/22/2015   Lab Results  Component Value Date   CHOL 123 01/18/2016   HDL 31* 01/18/2016   LDLCALC 75 01/18/2016   TRIG 86 01/18/2016   CHOLHDL 4.9 08/12/2015    Significant Diagnostic Results in last 30 days:  No results found.  Assessment/Plan Fall episode Found on the floor by facility staff.B/p stable ranging 120/60's-140's/60 HR 60's-70's. Exam findings negative for bruises. Moves all extremities without any difficulties.Pleasantly confused. Obtain  Urine specimen for U/A and C/S r/o UTI may In and out cath if needed.   Fall and safety precautions.    Family/ staff Communication: Reviewed plan of care with patient and Nurse supervisor. Labs/tests ordered:  Urine specimen for U/A and C/S r/o UTI may In and out cath if needed.

## 2016-02-11 ENCOUNTER — Non-Acute Institutional Stay (SKILLED_NURSING_FACILITY): Payer: Medicare Other | Admitting: Family

## 2016-02-11 ENCOUNTER — Encounter: Payer: Self-pay | Admitting: Family

## 2016-02-11 DIAGNOSIS — I4891 Unspecified atrial fibrillation: Secondary | ICD-10-CM

## 2016-02-11 DIAGNOSIS — R269 Unspecified abnormalities of gait and mobility: Secondary | ICD-10-CM | POA: Diagnosis not present

## 2016-02-11 DIAGNOSIS — N400 Enlarged prostate without lower urinary tract symptoms: Secondary | ICD-10-CM | POA: Diagnosis not present

## 2016-02-11 DIAGNOSIS — H02109 Unspecified ectropion of unspecified eye, unspecified eyelid: Secondary | ICD-10-CM | POA: Diagnosis not present

## 2016-02-11 DIAGNOSIS — I5032 Chronic diastolic (congestive) heart failure: Secondary | ICD-10-CM

## 2016-02-11 DIAGNOSIS — I110 Hypertensive heart disease with heart failure: Secondary | ICD-10-CM

## 2016-02-11 MED ORDER — HYPROMELLOSE (GONIOSCOPIC) 2.5 % OP SOLN: 1.0000 [drp] | Freq: Three times a day (TID) | OPHTHALMIC | Status: DC

## 2016-02-11 NOTE — Progress Notes (Signed)
Patient ID: [name], male   DOB: Aug 09, 1925, 82 y.o.   MRN: [id number]  Location:  [facility] Room Number: 1102-B Place of Service:  SNF [phone]) Provider: Dinah Ngetich FNP-C   [name], MD  Patient Care Team: Tonia Ghent, MD as PCP - General  Extended Emergency Contact Information Primary Emergency Contact: [facility] Address: [address]          [place], [place] [address] Johnnette Litter of [place] Phone: [phone] Mobile Phone: [phone] Relation: Spouse Secondary Emergency Contact: Pickford,[name] Address: [address]          [place], [place] [address] Johnnette Litter of [place] Work Phone: [phone] Mobile Phone: [phone] Relation: Son  Code Status: Full Code  Goals of care: Advanced Directive information Advanced Directives 02/11/2016  Does patient have an advance directive? Yes  Type of Advance Directive (No Data)  Does patient want to make changes to advanced directive? -  Copy of advanced directive(s) in chart? -     Chief Complaint  Patient presents with  . Medical Management of Chronic Issues    Routine Visit     HPI:  Pt is a 82 y.o. male seen today at [facility] for medical management of chronic diseases. He has a medical history of HTN, CHF, BPH, Afib  Among others. He is seen in his room today. He denies any acute issues. He continues to self propel on facility hallway on his wheelchair. He requires assistance with ADL's. Facility staff reports no recent fall episodes.     Past Medical History  Diagnosis Date  . Hypertension   . Hyperlipidemia   . Tobacco abuse   . Lung nodule     Left lung, seen 2012, no change in 2012, no follow up needed   . PNA (pneumonia) 2010    Bilateral Pneumonia, COPD 11mm LLL Nodule   . History of ETT 09/1991     POS   . History of MRI 04-22-06    L/S- right hnp  l5/si   . B12 deficiency   . TIA (transient ischemic attack)   . GERD  (gastroesophageal reflux disease)   . Esophageal stricture   . Hemorrhoids   . Colon polyps     hyperplastic  . Pneumonia   . Macular degeneration   . Stroke ([place])   . CKD (chronic kidney disease), stage II    Past Surgical History  Procedure Laterality Date  . Cystectomy  1995    Lumbar area   . Hip pinning,cannulated Left 03/28/2015    Procedure: CANNULATED HIP PINNING;  Surgeon: [name], MD;  Location: [facility];  Service: Orthopedics;  Laterality: Left;    No Known Allergies    Medication List       This list is accurate as of: 02/11/16  4:04 PM.  Always use your most recent med list.               albuterol 0.63 MG/3ML nebulizer solution  Commonly known as:  ACCUNEB  Take 1 ampule by nebulization every 6 (six) hours as needed for wheezing or shortness of breath.     aspirin 81 MG EC tablet  Take 1 tablet (81 mg total) by mouth daily.     furosemide 20 MG tablet  Commonly known as:  LASIX  Take 1 tablet (20 mg total) by mouth daily.     HYDROcodone-acetaminophen 5-325 MG tablet  Commonly known  as:  NORCO/VICODIN  Take 0.5-1 tablets by mouth every 6 (six) hours as needed for severe pain (if not better with tramadol.  sedation caution).     levalbuterol 1.25 MG/0.5ML nebulizer solution  Commonly known as:  XOPENEX  Take 1.25 mg by nebulization 3 (three) times daily.     metoprolol tartrate 25 MG tablet  Commonly known as:  LOPRESSOR  Take 12.5 mg by mouth 2 (two) times daily. Hold if  HR < 60 or SBP < 110     omeprazole 20 MG capsule  Commonly known as:  PRILOSEC  Take 20 mg by mouth daily.     oxybutynin 5 MG tablet  Commonly known as:  DITROPAN  Take 5 mg by mouth daily.     OXYGEN  Inhale 2 L into the lungs.     PROCEL PO  2 scoops Procel by mouth twice daily.     simvastatin 20 MG tablet  Commonly known as:  ZOCOR  Take 1 tablet (20 mg total) by mouth daily.     tamsulosin 0.4 MG Caps capsule  Commonly known as:  FLOMAX  Take 1 capsule  (0.4 mg total) by mouth daily.     traMADol 50 MG tablet  Commonly known as:  ULTRAM  Take 50 mg by mouth every 12 (twelve) hours as needed for moderate pain.     traZODone 25 mg Tabs tablet  Commonly known as:  DESYREL  Take 25 mg by mouth at bedtime. May repeat dose if not asleep in 1 - 2 hours     zinc oxide 11.3 % Crea cream  Commonly known as:  BALMEX  Apply 1 application topically 2 (two) times daily. Apply over Nizoral cream 2 times daily        Review of Systems  Constitutional: Negative for fever, chills, activity change, appetite change and fatigue.  HENT: Negative for congestion, sinus pressure, sneezing and sore throat.   Eyes: Negative for pain, discharge, redness and itching.  Respiratory: Negative for cough, chest tightness, shortness of breath and wheezing.   Cardiovascular: Positive for leg swelling. Negative for chest pain.  Gastrointestinal: Negative for nausea, vomiting, abdominal pain, diarrhea, constipation and abdominal distention.  Endocrine: Negative.   Genitourinary: Negative for dysuria, urgency, frequency and flank pain.  Musculoskeletal: Positive for gait problem. Negative for back pain.  Skin: Negative.   Neurological: Negative for dizziness, seizures, light-headedness and headaches.  Hematological: Does not bruise/bleed easily.  Psychiatric/Behavioral: Negative for hallucinations, confusion, sleep disturbance and agitation. The patient is not nervous/anxious.     Immunization History  Administered Date(s) Administered  . Influenza Split 05/31/2012  . Influenza Whole 05/28/2008, 06/18/2009, 04/26/2010  . Influenza,inj,Quad PF,36+ Mos 06/07/2013, 05/26/2014  . PPD Test 11/16/2015, 12/23/2015  . Pneumococcal Conjugate-13 12/01/2014  . Pneumococcal Polysaccharide-23 06/10/1998  . Td 06/10/1998, 05/31/2012  . Zoster 11/10/2010   Pertinent  Health Maintenance Due  Topic Date Due  . INFLUENZA VACCINE  03/29/2016  . PNA vac Low Risk Adult   Completed   Fall Risk  12/01/2014  Falls in the past year? No   Functional Status Survey:    Filed Vitals:   02/11/16 1044  BP: 147/70  Pulse: 64  Temp: 96.2 F (35.7 C)  Resp: 18  Height: 5\' 5"  (1.651 m)  Weight: 129 lb (58.514 kg)  SpO2: 96%   Body mass index is 21.47 kg/(m^2). Physical Exam  Constitutional: He appears well-developed and well-nourished.  Elderly in no acute distress. Pleasantly confused.  HENT:  Head: Normocephalic.  Mouth/Throat: Oropharynx is clear and moist.  Eyes: Conjunctivae and EOM are normal. Pupils are equal, round, and reactive to light. Right eye exhibits no discharge. Left eye exhibits no discharge. No scleral icterus.  Bilateral lower eyelid turned outward. No drainage noted.   Neck: Normal range of motion. No JVD present.  Cardiovascular: Normal rate, regular rhythm, normal heart sounds and intact distal pulses.  Exam reveals no gallop and no friction rub.   No murmur heard. Pulmonary/Chest: Effort normal and breath sounds normal. No respiratory distress. He has no wheezes. He has no rales.  Oxygen 2 liter nasal cannula   Abdominal: Soft. Bowel sounds are normal. He exhibits no distension. There is no tenderness. There is no rebound and no guarding.  Musculoskeletal: He exhibits no edema or tenderness.  Unsteady gait. Bilateral lower extremities weakness. Self propel on wheelchair  Lymphadenopathy:    He has no cervical adenopathy.  Neurological: He is alert.  Skin: Skin is warm and dry. No rash noted. No erythema. No pallor.  Psychiatric: He has a normal mood and affect.    Labs reviewed:  Recent Labs  11/16/15 0457  12/21/15 0355 12/22/15 0231 12/23/15 12/23/15 0543 12/29/15  NA 138  < > 130* 132* 137 137 139  K 3.9  < > 3.3* 3.9  --  4.0 4.2  CL 96*  < > 86* 98*  --  102  --   CO2 32  < > 27 24  --  24  --   GLUCOSE 99  < > 116* 128*  --  108*  --   BUN 57*  < > 66* 57* 49* 49* 30*  CREATININE 1.82*  < > 2.20* 1.74* 1.4*  1.44* 1.0  CALCIUM 8.4*  < > 8.0* 7.9*  --  8.0*  --   MG 1.9  --   --  1.7  --  1.8  --   < > = values in this interval not displayed.  Recent Labs  12/21/15 0355 12/22/15 0231 12/23/15 0543 12/29/15 01/18/16  AST 57* 149* 116* 28 16  ALT 43 92* 112* 56* 18  ALKPHOS 107 103 105 100 105  BILITOT 0.8 1.0 0.7  --   --   PROT 6.9 5.6* 5.3*  --   --   ALBUMIN 2.3* 1.8* 1.6*  --   --     Recent Labs  12/17/15 1322 12/21/15 0355 12/22/15 0231  12/23/15 0543 12/29/15 01/04/16  WBC 9.3 19.0* 10.6*  < > 14.4* 11.7 7.9  NEUTROABS 6.8 16.9*  --   --  13.2*  --   --   HGB 12.1* 11.9* 9.3*  --  8.6* 10.3* 9.4*  HCT 37.0* 35.6* 28.1*  --  26.3* 32* 29*  MCV 80.6 78.6 78.3  --  78.3  --   --   PLT 324.0 264 192  --  237 543* 460*  < > = values in this interval not displayed. Lab Results  Component Value Date   TSH 1.93 10/20/2015   Lab Results  Component Value Date   HGBA1C 6.2* 12/22/2015   Lab Results  Component Value Date   CHOL 123 01/18/2016   HDL 31* 01/18/2016   LDLCALC 75 01/18/2016   TRIG 86 01/18/2016   CHOLHDL 4.9 08/12/2015    Significant Diagnostic Results in last 30 days:  No results found.  Assessment/Plan 1. Hypertensive heart disease with CHF (congestive heart failure) (HCC) B/p stable. Continue on Metoprolol  25 mg tablet. CBC, Hgb A1C, TSH level and BMP 02/12/2016  2. Chronic diastolic CHF (congestive heart failure) (HCC) Stable. Negative exam findings. Continue lasix 20 mg Tablet and Metoprolol 25 mg Tablet.   3. Atrial fibrillation with RVR (HCC) HR controlled.Continue on Metoprolol 25 mg tablet.   4. BPH (benign prostatic hyperplasia) Continue on oxybutynin   5. Abnormality of gait Self propel on wheelchair. Fall and safety precautions.   6. Ectropion, unspecified laterality Both eyelid. Ordering artificial tears every 6 hours  to maintain moisture. Monitor for redness or signs of drainage.     Family/ staff Communication:Reviewed plan of  care with patient and facility Nurse supervisor.   Labs/tests ordered: CBC, BMP, Hgb A1C and TSH level 02/12/2016

## 2016-02-12 LAB — BASIC METABOLIC PANEL
BUN: 20 mg/dL (ref 4–21)
CREATININE: 1 mg/dL (ref 0.6–1.3)
Glucose: 92 mg/dL
POTASSIUM: 4.3 mmol/L (ref 3.4–5.3)
Sodium: 133 mmol/L — AB (ref 137–147)

## 2016-02-12 LAB — CBC AND DIFFERENTIAL
HCT: 35 % — AB (ref 41–53)
Hemoglobin: 10.8 g/dL — AB (ref 13.5–17.5)
PLATELETS: 363 10*3/uL (ref 150–399)
WBC: 9.5 10*3/mL

## 2016-02-12 LAB — TSH: TSH: 6.34 u[IU]/mL — AB (ref 0.41–5.90)

## 2016-02-12 LAB — HEMOGLOBIN A1C: Hemoglobin A1C: 5.5

## 2016-02-16 LAB — TSH: TSH: 4.01 u[IU]/mL (ref 0.41–5.90)

## 2016-03-17 ENCOUNTER — Non-Acute Institutional Stay (SKILLED_NURSING_FACILITY): Payer: Medicare Other | Admitting: Family

## 2016-03-17 ENCOUNTER — Encounter: Payer: Self-pay | Admitting: Family

## 2016-03-17 DIAGNOSIS — R600 Localized edema: Secondary | ICD-10-CM

## 2016-03-17 DIAGNOSIS — L609 Nail disorder, unspecified: Secondary | ICD-10-CM

## 2016-03-17 DIAGNOSIS — N400 Enlarged prostate without lower urinary tract symptoms: Secondary | ICD-10-CM

## 2016-03-17 DIAGNOSIS — L602 Onychogryphosis: Secondary | ICD-10-CM

## 2016-03-17 DIAGNOSIS — I5032 Chronic diastolic (congestive) heart failure: Secondary | ICD-10-CM

## 2016-03-17 DIAGNOSIS — R531 Weakness: Secondary | ICD-10-CM | POA: Diagnosis not present

## 2016-03-17 DIAGNOSIS — K219 Gastro-esophageal reflux disease without esophagitis: Secondary | ICD-10-CM | POA: Diagnosis not present

## 2016-03-17 DIAGNOSIS — I110 Hypertensive heart disease with heart failure: Secondary | ICD-10-CM | POA: Diagnosis not present

## 2016-03-17 NOTE — Progress Notes (Signed)
Patient ID: [name], male   DOB: [birth year], 82 y.o.   MRN: [id number]  Location:    [facility] Room Number: 1102-B Place of Service:  SNF [phone]) Provider:  Dinah Ngetich FNP-C   [name], MD  Patient Care Team: Tonia Ghent, MD as PCP - General  Extended Emergency Contact Information Primary Emergency Contact: [facility] Address: [address]          [place], [place] [address] Johnnette Litter of [place] Phone: [phone] Mobile Phone: [phone] Relation: Spouse Secondary Emergency Contact: Patchell,[name] Address: [address]          [place], [place] [address] Johnnette Litter of [place] Work Phone: [phone] Mobile Phone: [phone] Relation: Son  Code Status:  Full Code  Goals of care: Advanced Directive information Advanced Directives 02/11/2016  Does patient have an advance directive? Yes  Type of Advance Directive (No Data)  Does patient want to make changes to advanced directive? -  Copy of advanced directive(s) in chart? -     Chief Complaint  Patient presents with  . Medical Management of Chronic Issues    Routine Visit    HPI:  Pt is a 82 y.o. male seen today at [facility]  for medical management of chronic diseases. He is seen in his room today. He denies any acute issues this visit. He answers questions appropriately. He has been participating in the facility activities and restorative exercise. He has had no recent fall episodes or recent hospital admission. Facility staff reports no new concerns.     Past Medical History  Diagnosis Date  . Hypertension   . Hyperlipidemia   . Tobacco abuse   . Lung nodule     Left lung, seen 2012, no change in 2012, no follow up needed   . PNA (pneumonia) 2010    Bilateral Pneumonia, COPD 54mm LLL Nodule   . History of ETT 09/1991     POS   . History of MRI 04-22-06    L/S- right hnp  l5/si   . B12 deficiency   . TIA (transient  ischemic attack)   . GERD (gastroesophageal reflux disease)   . Esophageal stricture   . Hemorrhoids   . Colon polyps     hyperplastic  . Pneumonia   . Macular degeneration   . Stroke ([place])   . CKD (chronic kidney disease), stage II    Past Surgical History  Procedure Laterality Date  . Cystectomy  1995    Lumbar area   . Hip pinning,cannulated Left 03/28/2015    Procedure: CANNULATED HIP PINNING;  Surgeon: [name], MD;  Location: [facility];  Service: Orthopedics;  Laterality: Left;    No Known Allergies    Medication List       This list is accurate as of: 03/17/16  6:02 PM.  Always use your most recent med list.               albuterol 0.63 MG/3ML nebulizer solution  Commonly known as:  ACCUNEB  Take 1 ampule by nebulization every 6 (six) hours as needed for wheezing or shortness of breath.     aspirin 81 MG EC tablet  Take 1 tablet (81 mg total) by mouth daily.     furosemide 20 MG tablet  Commonly known as:  LASIX  Take 1 tablet (20 mg total) by mouth daily.     HYDROcodone-acetaminophen 5-325 MG tablet  Commonly known as:  NORCO/VICODIN  Take 0.5-1 tablets by mouth every 6 (six) hours as needed for severe pain (if not better with tramadol.  sedation caution).     hydroxypropyl methylcellulose / hypromellose 2.5 % ophthalmic solution  Commonly known as:  ISOPTO TEARS / GONIOVISC  Place 1 drop into both eyes 3 (three) times daily.     levalbuterol 1.25 MG/0.5ML nebulizer solution  Commonly known as:  XOPENEX  Take 1.25 mg by nebulization 3 (three) times daily.     metoprolol tartrate 25 MG tablet  Commonly known as:  LOPRESSOR  Take 12.5 mg by mouth daily. Hold if  HR < 60 or SBP < 110     omeprazole 20 MG capsule  Commonly known as:  PRILOSEC  Take 20 mg by mouth daily.     oxybutynin 5 MG tablet  Commonly known as:  DITROPAN  Take 5 mg by mouth daily.     OXYGEN  Inhale 2 L into the lungs.     PROCEL PO  2 scoops Procel by mouth twice daily.       simvastatin 20 MG tablet  Commonly known as:  ZOCOR  Take 1 tablet (20 mg total) by mouth daily.     tamsulosin 0.4 MG Caps capsule  Commonly known as:  FLOMAX  Take 1 capsule (0.4 mg total) by mouth daily.     traMADol 50 MG tablet  Commonly known as:  ULTRAM  Take 50 mg by mouth every 12 (twelve) hours as needed for moderate pain.     traZODone 25 mg Tabs tablet  Commonly known as:  DESYREL  Take 25 mg by mouth at bedtime. May repeat dose if not asleep in 1 - 2 hours     zinc oxide 11.3 % Crea cream  Commonly known as:  BALMEX  Apply 1 application topically 2 (two) times daily. Apply over Nizoral cream 2 times daily        Review of Systems  Constitutional: Negative for fever, chills, activity change, appetite change and fatigue.  HENT: Negative for congestion, sinus pressure, sneezing and sore throat.   Eyes: Negative for pain, discharge, redness and itching.  Respiratory: Negative for cough, chest tightness, shortness of breath and wheezing.        Oxygen via nasal cannula   Cardiovascular: Positive for leg swelling. Negative for chest pain.  Gastrointestinal: Negative for nausea, vomiting, abdominal pain, diarrhea, constipation and abdominal distention.  Endocrine: Negative.   Genitourinary: Negative for dysuria, urgency, frequency and flank pain.  Musculoskeletal: Positive for gait problem. Negative for back pain.  Skin: Negative.   Neurological: Negative for dizziness, seizures, light-headedness and headaches.  Hematological: Does not bruise/bleed easily.  Psychiatric/Behavioral: Negative for hallucinations, confusion, sleep disturbance and agitation. The patient is not nervous/anxious.     Immunization History  Administered Date(s) Administered  . Influenza Split 05/31/2012  . Influenza Whole 05/28/2008, 06/18/2009, 04/26/2010  . Influenza,inj,Quad PF,36+ Mos 06/07/2013, 05/26/2014  . PPD Test 11/16/2015, 12/23/2015  . Pneumococcal Conjugate-13 12/01/2014   . Pneumococcal Polysaccharide-23 06/10/1998  . Td 06/10/1998, 05/31/2012  . Zoster 11/10/2010   Pertinent  Health Maintenance Due  Topic Date Due  . INFLUENZA VACCINE  03/29/2016  . PNA vac Low Risk Adult  Completed   Fall Risk  12/01/2014  Falls in the past year? No   Functional Status Survey:    Filed Vitals:   03/17/16 0923  BP: 120/82  Pulse: 69  Temp: 97.2 F (36.2 C)  TempSrc: Oral  Resp: 19  Height: 5\' 5"  (1.651 m)  Weight: 129 lb (58.514 kg)  SpO2: 99%   Body mass index is 21.47 kg/(m^2). Physical Exam  Constitutional: He appears well-developed and well-nourished.  Elderly in no acute distress.  HENT:  Head: Normocephalic.  Mouth/Throat: Oropharynx is clear and moist.  Eyes: Conjunctivae and EOM are normal. Pupils are equal, round, and reactive to light. Right eye exhibits no discharge. Left eye exhibits no discharge. No scleral icterus.  Bilateral lower eyelid turned outward without any  drainage noted.   Neck: Normal range of motion. No JVD present.  Cardiovascular: Normal rate, regular rhythm, normal heart sounds and intact distal pulses.  Exam reveals no gallop and no friction rub.   No murmur heard. Pulmonary/Chest: Effort normal and breath sounds normal. No respiratory distress. He has no wheezes. He has no rales.  Oxygen 2 liter nasal cannula   Abdominal: Soft. Bowel sounds are normal. He exhibits no distension. There is no tenderness. There is no rebound and no guarding.  Musculoskeletal: He exhibits no edema or tenderness.  Unsteady gait. Bilateral lower extremities weakness. + 1 leg edema. Self propel on wheelchair  Lymphadenopathy:    He has no cervical adenopathy.  Neurological: He is alert.  Skin: Skin is warm and dry. No rash noted. No erythema. No pallor.  Over grown toenail.  Psychiatric: He has a normal mood and affect.    Labs reviewed:  Recent Labs  11/16/15 0457  12/21/15 0355 12/22/15 0231  12/23/15 0543 12/29/15 02/12/16  NA  138  < > 130* 132*  < > 137 139 133*  K 3.9  < > 3.3* 3.9  --  4.0 4.2 4.3  CL 96*  < > 86* 98*  --  102  --   --   CO2 32  < > 27 24  --  24  --   --   GLUCOSE 99  < > 116* 128*  --  108*  --   --   BUN 57*  < > 66* 57*  < > 49* 30* 20  CREATININE 1.82*  < > 2.20* 1.74*  < > 1.44* 1.0 1.0  CALCIUM 8.4*  < > 8.0* 7.9*  --  8.0*  --   --   MG 1.9  --   --  1.7  --  1.8  --   --   < > = values in this interval not displayed.  Recent Labs  12/21/15 0355 12/22/15 0231 12/23/15 0543 12/29/15 01/18/16  AST 57* 149* 116* 28 16  ALT 43 92* 112* 56* 18  ALKPHOS 107 103 105 100 105  BILITOT 0.8 1.0 0.7  --   --   PROT 6.9 5.6* 5.3*  --   --   ALBUMIN 2.3* 1.8* 1.6*  --   --     Recent Labs  12/17/15 1322 12/21/15 0355 12/22/15 0231  12/23/15 0543 12/29/15 01/04/16 02/12/16  WBC 9.3 19.0* 10.6*  < > 14.4* 11.7 7.9 9.5  NEUTROABS 6.8 16.9*  --   --  13.2*  --   --   --   HGB 12.1* 11.9* 9.3*  --  8.6* 10.3* 9.4* 10.8*  HCT 37.0* 35.6* 28.1*  --  26.3* 32* 29* 35*  MCV 80.6 78.6 78.3  --  78.3  --   --   --   PLT 324.0 264 192  --  237 543* 460* 363  < > = values in this  interval not displayed. Lab Results  Component Value Date   TSH 4.01 02/16/2016   Lab Results  Component Value Date   HGBA1C 5.5 02/12/2016   Lab Results  Component Value Date   CHOL 123 01/18/2016   HDL 31* 01/18/2016   LDLCALC 75 01/18/2016   TRIG 86 01/18/2016   CHOLHDL 4.9 08/12/2015    Significant Diagnostic Results in last 30 days:  No results found.  Assessment/Plan 1. Localized edema 1+ to lower extremities. Start on Bilateral Ted hose knee high on in the morning and off at bedtime. Continue on Furosemide 20 mg Tablet. Will increase to 40 mg tablet if worsening. Monitor BMP   2. Overgrown toenails Refer to podiatrist to trim toenail X 1 then every 3 months.   3. Hypertensive heart disease with CHF (congestive heart failure) (HCC) B/p Stable. Metoprolol 25 mg tablet daily.   4. Chronic  diastolic CHF (congestive heart failure) (HCC) No cough, wheezing or rales. + 1 edema to lower extremities. Continue Furosemide 20 mg Tablet and Metoprolol 25 mg tablet. Monitor weight.    5. BPH (benign prostatic hyperplasia) Continue on oxybutynin 5 mg tablet daily.   6. Weakness Bilateral lower extremities. Continue on Restorative exercises. Fall and safety precautions. Encourage oral intake and hydration.   7. Gastroesophageal reflux disease without esophagitis Stable. Continue on omeprazole.     Family/ staff Communication: Reviewed plan with patient and facility Nurse supervisor.   Labs/tests ordered:  None

## 2016-03-23 ENCOUNTER — Non-Acute Institutional Stay (SKILLED_NURSING_FACILITY): Payer: Medicare Other | Admitting: Family

## 2016-03-23 ENCOUNTER — Encounter: Payer: Self-pay | Admitting: Family

## 2016-03-23 DIAGNOSIS — R531 Weakness: Secondary | ICD-10-CM

## 2016-03-23 DIAGNOSIS — H1033 Unspecified acute conjunctivitis, bilateral: Secondary | ICD-10-CM | POA: Diagnosis not present

## 2016-03-23 NOTE — Progress Notes (Signed)
Location:   [name] Room Number: 1206-P Place of Service:  SNF [phone]) Provider: Telsa Dillavou FNP-C   [name], MD  Patient Care Team: Tonia Ghent, MD as PCP - General  Extended Emergency Contact Information Primary Emergency Contact: [facility] Address: [address]          [place], [place] [address] Johnnette Litter of [place] Phone: [phone] Mobile Phone: [phone] Relation: Spouse Secondary Emergency Contact: Sleeth,[name] Address: [address]          [place], [place] [address] Johnnette Litter of [place] Work Phone: [phone] Mobile Phone: [phone] Relation: Son  Code Status:  Full Code  Goals of care: Advanced Directive information Advanced Directives 02/11/2016  Does patient have an advance directive? Yes  Type of Advance Directive (No Data)  Does patient want to make changes to advanced directive? -  Copy of advanced directive(s) in chart? -  Would patient like information on creating an advanced directive? -  Pre-existing out of facility DNR order (yellow form or pink MOST form) -     Chief Complaint  Patient presents with  . Acute Visit    HPI:  Pt is a 82 y.o. male seen today at [facility] for an acute visit for evaluation of generalized weakness. He is seen in his room today per facility Nurse request who states patient was sitting on wheelchair bend over at waist and did know why he was bend over. He was transferred over to a [place] chair with much improvement. Occupation therapist consulted. Patient's Nurse states patient  seems to be doing much better today. He denies any acute issues. Patient's CNA in the room cleaning patient's eye states he has had crusty yellow drainage for the past two days. He is currently wean off oxygen with oxygen saturations in the upper 90's.    Past Medical History:  Diagnosis Date  . B12 deficiency   . CKD (chronic kidney disease), stage II     . Colon polyps    hyperplastic  . Esophageal stricture   . GERD (gastroesophageal reflux disease)   . Hemorrhoids   . History of ETT 09/1991    POS   . History of MRI 04-22-06   L/S- right hnp  l5/si   . Hyperlipidemia   . Hypertension   . Lung nodule    Left lung, seen 2012, no change in 2012, no follow up needed   . Macular degeneration   . PNA (pneumonia) 2010   Bilateral Pneumonia, COPD 7mm LLL Nodule   . Pneumonia   . Stroke ([place])   . TIA (transient ischemic attack)   . Tobacco abuse    Past Surgical History:  Procedure Laterality Date  . CYSTECTOMY  1995   Lumbar area   . HIP PINNING,CANNULATED Left 03/28/2015   Procedure: CANNULATED HIP PINNING;  Surgeon: [name], MD;  Location: [facility];  Service: Orthopedics;  Laterality: Left;    No Known Allergies    Medication List       Accurate as of 03/23/16  1:41 PM. Always use your most recent med list.          albuterol 0.63 MG/3ML nebulizer solution Commonly known as:  ACCUNEB Take 1 ampule by nebulization every 6 (six) hours as needed for wheezing or shortness of breath.   aspirin 81 MG EC tablet Take 1 tablet (81 mg total) by mouth daily.   furosemide 20 MG  tablet Commonly known as:  LASIX Take 1 tablet (20 mg total) by mouth daily.   HYDROcodone-acetaminophen 5-325 MG tablet Commonly known as:  NORCO/VICODIN Take 0.5-1 tablets by mouth every 6 (six) hours as needed for severe pain (if not better with tramadol.  sedation caution).   hydroxypropyl methylcellulose / hypromellose 2.5 % ophthalmic solution Commonly known as:  ISOPTO TEARS / GONIOVISC Place 1 drop into both eyes 4 (four) times daily.   levalbuterol 1.25 MG/0.5ML nebulizer solution Commonly known as:  XOPENEX Take 1.25 mg by nebulization 3 (three) times daily.   metoprolol tartrate 25 MG tablet Commonly known as:  LOPRESSOR Take 12.5 mg by mouth daily. Hold if  HR < 60 or SBP < 110   omeprazole 20 MG capsule Commonly known as:   PRILOSEC Take 20 mg by mouth daily.   oxybutynin 5 MG tablet Commonly known as:  DITROPAN Take 5 mg by mouth daily.   OXYGEN Inhale 2 L into the lungs.   PROCEL PO 2 scoops Procel by mouth twice daily.   simvastatin 20 MG tablet Commonly known as:  ZOCOR Take 1 tablet (20 mg total) by mouth daily.   tamsulosin 0.4 MG Caps capsule Commonly known as:  FLOMAX Take 1 capsule (0.4 mg total) by mouth daily.   traMADol 50 MG tablet Commonly known as:  ULTRAM Take 50 mg by mouth every 12 (twelve) hours as needed for moderate pain.   traZODone 25 mg Tabs tablet Commonly known as:  DESYREL Take 25 mg by mouth at bedtime. May repeat dose if not asleep in 1 - 2 hours   zinc oxide 11.3 % Crea cream Commonly known as:  BALMEX Apply 1 application topically 2 (two) times daily. Apply over Nizoral cream 2 times daily       Review of Systems  Constitutional: Negative for activity change, appetite change, chills, fatigue and fever.  HENT: Negative for congestion, sinus pressure, sneezing and sore throat.   Eyes: Positive for discharge and redness. Negative for pain, itching and visual disturbance.  Respiratory: Negative for cough, chest tightness, shortness of breath and wheezing.        Oxygen via nasal cannula   Cardiovascular: Negative for chest pain.  Gastrointestinal: Negative for abdominal distention, abdominal pain, constipation, diarrhea, nausea and vomiting.  Endocrine: Negative.   Genitourinary: Negative for dysuria, flank pain, frequency and urgency.  Musculoskeletal: Positive for gait problem. Negative for back pain.  Skin: Negative.   Neurological: Negative for dizziness, seizures, light-headedness and headaches.       Generalized weakness   Hematological: Does not bruise/bleed easily.  Psychiatric/Behavioral: Negative for agitation, confusion, hallucinations and sleep disturbance. The patient is not nervous/anxious.     Immunization History  Administered Date(s)  Administered  . Influenza Split 05/31/2012  . Influenza Whole 05/28/2008, 06/18/2009, 04/26/2010  . Influenza,inj,Quad PF,36+ Mos 06/07/2013, 05/26/2014  . PPD Test 11/16/2015, 12/23/2015  . Pneumococcal Conjugate-13 12/01/2014  . Pneumococcal Polysaccharide-23 06/10/1998  . Td 06/10/1998, 05/31/2012  . Zoster 11/10/2010   Pertinent  Health Maintenance Due  Topic Date Due  . INFLUENZA VACCINE  03/29/2016  . PNA vac Low Risk Adult  Completed   Fall Risk  12/01/2014  Falls in the past year? No   Functional Status Survey:    Vitals:   03/23/16 0942  BP: 120/82  Pulse: (!) 58  Resp: 19  Temp: 97.2 F (36.2 C)  TempSrc: Oral  SpO2: 93%  Weight: 129 lb (58.5 kg)  Height: 5\' 5"  (  1.651 m)   Body mass index is 21.47 kg/m. Physical Exam  Constitutional: He appears well-developed and well-nourished.  Elderly in no acute distress.  HENT:  Head: Normocephalic.  Mouth/Throat: Oropharynx is clear and moist.  Eyes: EOM are normal. Pupils are equal, round, and reactive to light. Right eye exhibits discharge. Left eye exhibits discharge. No scleral icterus.  Bilateral lower eyelid turned outward. Yellow crusty drainage on eyelashes noted.   Neck: Normal range of motion. No JVD present.  Cardiovascular: Normal rate, regular rhythm and intact distal pulses.  Exam reveals no friction rub.   No murmur heard. Pulmonary/Chest: Effort normal and breath sounds normal. No respiratory distress. He has no wheezes. He has no rales.  Abdominal: Soft. Bowel sounds are normal. He exhibits no distension. There is no tenderness. There is no rebound and no guarding.  Musculoskeletal: He exhibits no edema or tenderness.  Unsteady gait.bilateral lower extremities decreased strength.   Lymphadenopathy:    He has no cervical adenopathy.  Neurological: He is alert.  Skin: Skin is warm and dry. No rash noted. No erythema. No pallor.  Psychiatric: He has a normal mood and affect.    Labs  reviewed:  Recent Labs  11/16/15 0457  12/21/15 0355 12/22/15 0231  12/23/15 0543 12/29/15 02/12/16  NA 138  < > 130* 132*  < > 137 139 133*  K 3.9  < > 3.3* 3.9  --  4.0 4.2 4.3  CL 96*  < > 86* 98*  --  102  --   --   CO2 32  < > 27 24  --  24  --   --   GLUCOSE 99  < > 116* 128*  --  108*  --   --   BUN 57*  < > 66* 57*  < > 49* 30* 20  CREATININE 1.82*  < > 2.20* 1.74*  < > 1.44* 1.0 1.0  CALCIUM 8.4*  < > 8.0* 7.9*  --  8.0*  --   --   MG 1.9  --   --  1.7  --  1.8  --   --   < > = values in this interval not displayed.  Recent Labs  12/21/15 0355 12/22/15 0231 12/23/15 0543 12/29/15 01/18/16  AST 57* 149* 116* 28 16  ALT 43 92* 112* 56* 18  ALKPHOS 107 103 105 100 105  BILITOT 0.8 1.0 0.7  --   --   PROT 6.9 5.6* 5.3*  --   --   ALBUMIN 2.3* 1.8* 1.6*  --   --     Recent Labs  12/17/15 1322 12/21/15 0355 12/22/15 0231  12/23/15 0543 12/29/15 01/04/16 02/12/16  WBC 9.3 19.0* 10.6*  < > 14.4* 11.7 7.9 9.5  NEUTROABS 6.8 16.9*  --   --  13.2*  --   --   --   HGB 12.1* 11.9* 9.3*  --  8.6* 10.3* 9.4* 10.8*  HCT 37.0* 35.6* 28.1*  --  26.3* 32* 29* 35*  MCV 80.6 78.6 78.3  --  78.3  --   --   --   PLT 324.0 264 192  --  237 543* 460* 363  < > = values in this interval not displayed. Lab Results  Component Value Date   TSH 4.01 02/16/2016   Lab Results  Component Value Date   HGBA1C 5.5 02/12/2016   Lab Results  Component Value Date   CHOL 123 01/18/2016   HDL 31 (A)  01/18/2016   LDLCALC 75 01/18/2016   TRIG 86 01/18/2016   CHOLHDL 4.9 08/12/2015    Significant Diagnostic Results in last 30 days:  No results found.  Assessment/Plan Acute conjunctivitis  Afebrile. Crusty yellow drainage on eyelashes with eye redness noted. Start Erythromycin Opthalmic 0.5 % ointment Apply 1 cm ribbon to lower conjunctiva sac every 6 hours X 7 days.   Generalized weakness  Afebrile. Noted bending over on a wheelchair though much improvement today per Nurse.Exam  findings negative. Will obtain CBC, BMP 03/24/2016. Occupational therapy consulted. May benefit from skilled PT as well. Continue to monitor    Family/ staff Communication: Reviewed plan with patient and facility Nurse, and Nurse  supervisor  Labs/tests ordered: CBC, BMP 03/24/2016

## 2016-03-24 LAB — BASIC METABOLIC PANEL
BUN: 19 mg/dL (ref 4–21)
Creatinine: 0.9 mg/dL (ref 0.6–1.3)
Glucose: 81 mg/dL
Potassium: 4 mmol/L (ref 3.4–5.3)
SODIUM: 135 mmol/L — AB (ref 137–147)

## 2016-03-24 LAB — CBC AND DIFFERENTIAL
HEMATOCRIT: 34 % — AB (ref 41–53)
HEMOGLOBIN: 10.9 g/dL — AB (ref 13.5–17.5)
PLATELETS: 355 10*3/uL (ref 150–399)
WBC: 9.4 10^3/mL

## 2016-03-24 LAB — TSH: TSH: 6.34 u[IU]/mL — AB (ref 0.41–5.90)

## 2016-03-26 IMAGING — DX DG CHEST 1V PORT
1 series · 1 of 1 positions shown · non-contrast
Comparison: 01/17/2014 and earlier.

CLINICAL DATA: This exam identified as missing a report at 7838 hr
on 08/01/2014.

89-year-old male with shortness of breath, respiratory distress,
nausea vomiting, chills. Initial encounter.
EXAM:
PORTABLE CHEST - 1 VIEW

[AP]
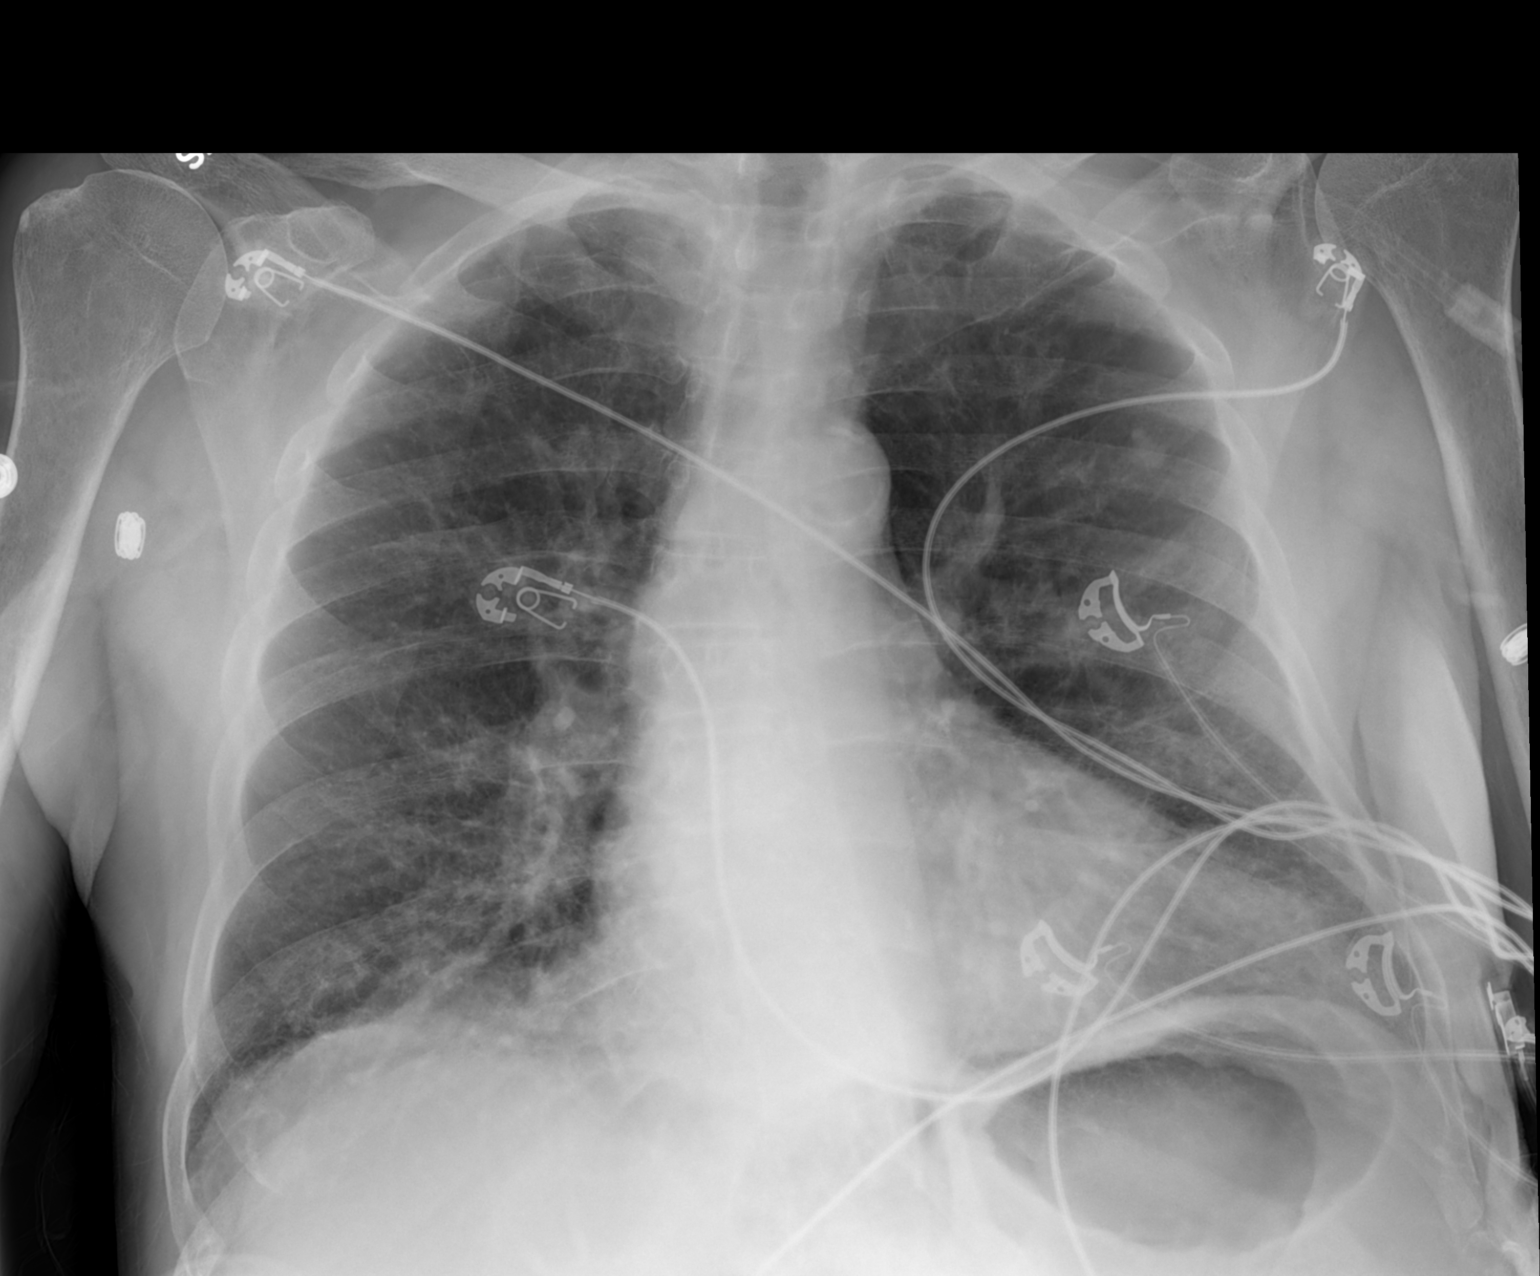

[1 of 1 positions shown; findings below may reference images not displayed]

FINDINGS: Portable AP semi upright view at 1143 hr on 07/31/2014. Stable
cardiac size and mediastinal contours. Stable lung volumes. No
pneumothorax or pulmonary edema. No pleural effusion. Chronic
increased reticular opacity at both lung bases. Two more nodular
areas of opacity off the left hilum appear to be new. Visualized
tracheal air column is within normal limits.
IMPRESSION: Chronic lung disease with 2 small new nodular opacities about the
left hilum, could reflect developing infection.

## 2016-04-03 IMAGING — CT CT HEAD W/O CM
2 series · 15 of 30 positions shown, 19 images · non-contrast
Comparison: Prior CT from 03/16/2011 and MRI from 03/16/2011.

CLINICAL DATA: Initial evaluation for acute left-sided headache.

EXAM:
CT HEAD WITHOUT CONTRAST
TECHNIQUE: Contiguous axial images were obtained from the base of the skull
through the vertex without intravenous contrast.

[Series 2: head w/o · axial · non-contrast · 0.45mm/px · z∈[-121,+19]mm · 13 of 34 slices shown, 17 images]
[im 3/34  brain]
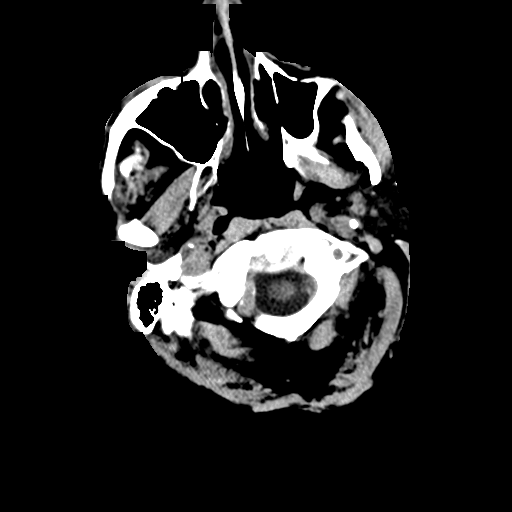
[im 3/34  bone]
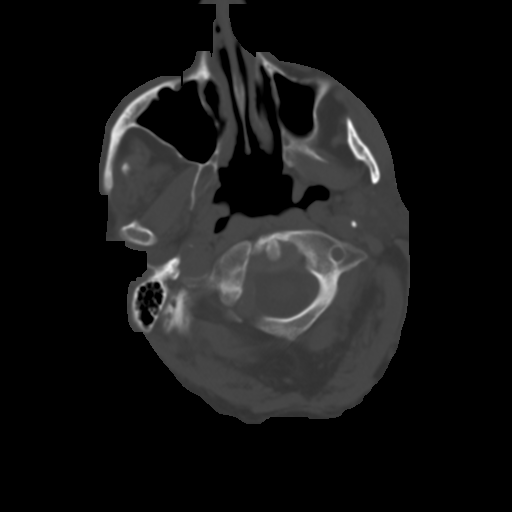
[im 5/34  brain]
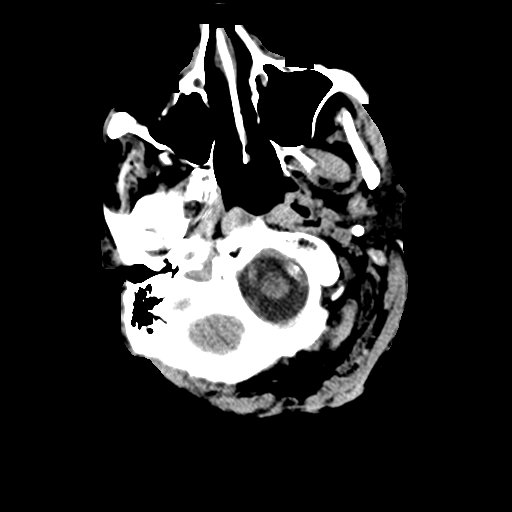
[im 8/34  brain]
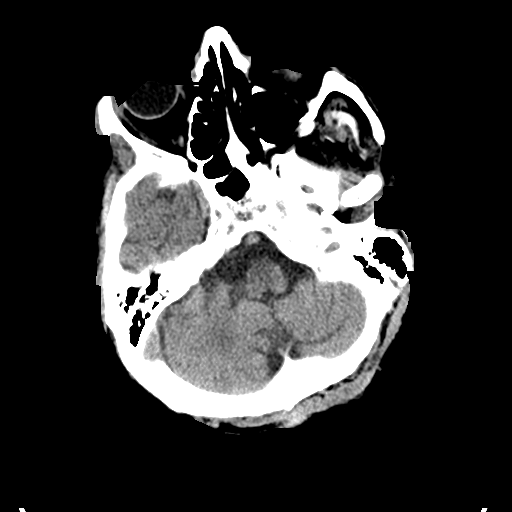
[im 10/34  brain]
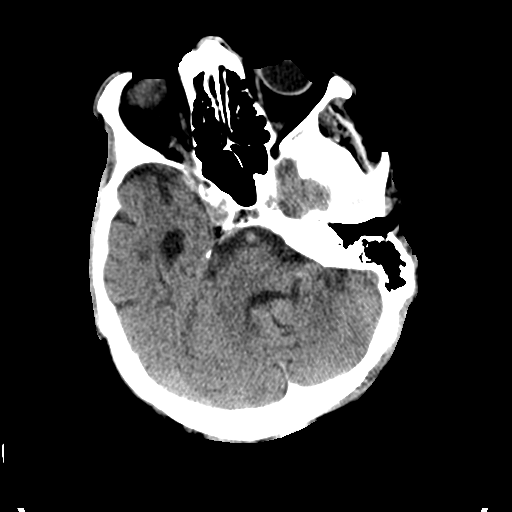
[im 12/34  brain]
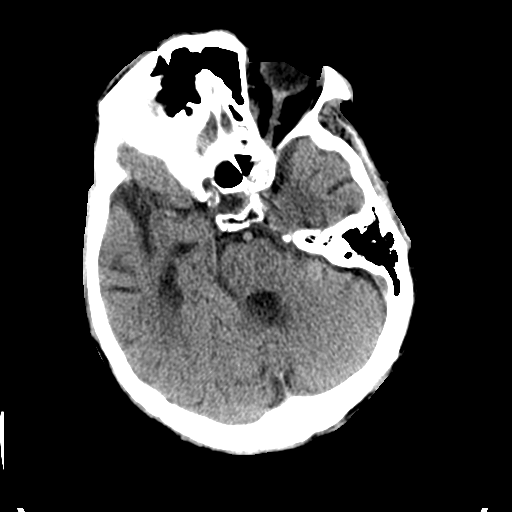
[im 12/34  bone]
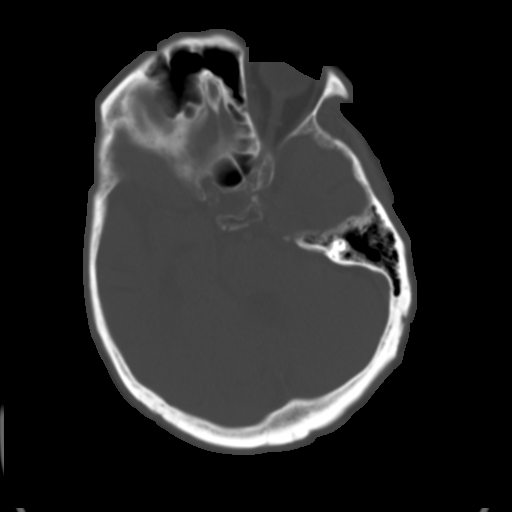
[im 15/34  brain]
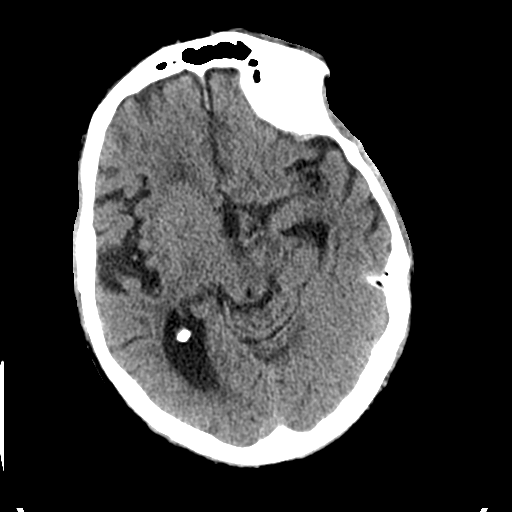
[im 17/34  brain]
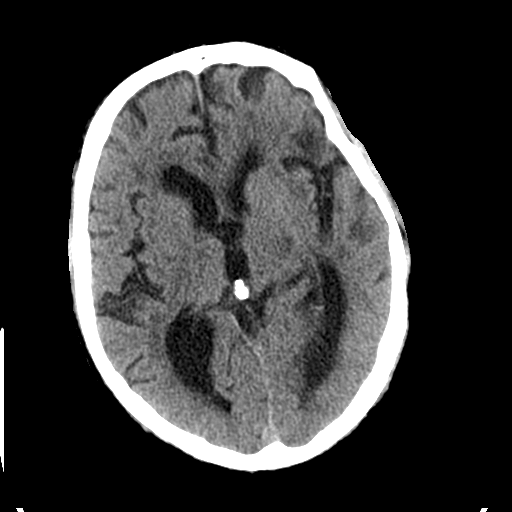
[im 19/34  brain]
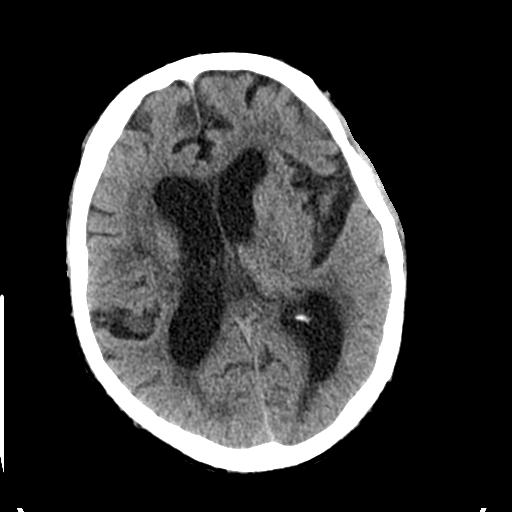
[im 22/34  brain]
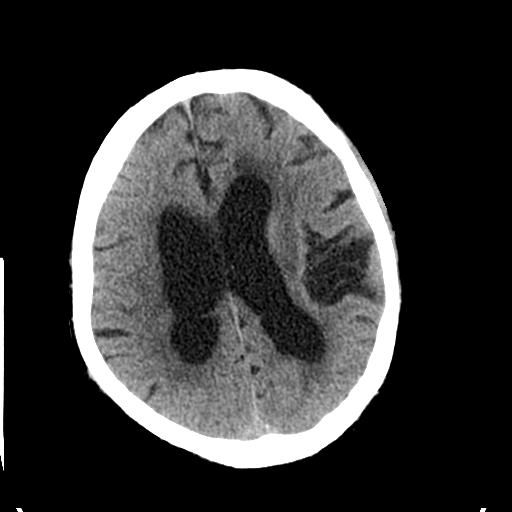
[im 22/34  bone]
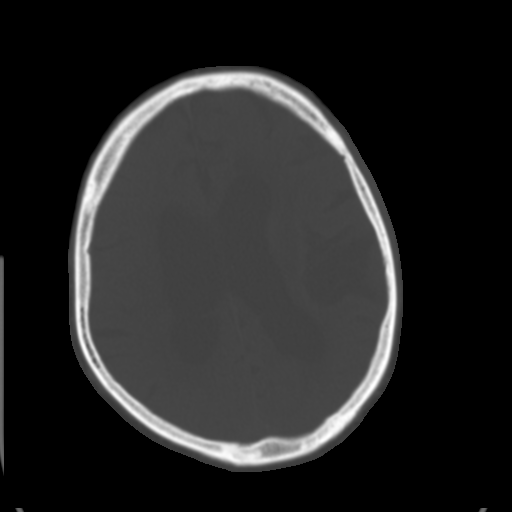
[im 24/34  brain]
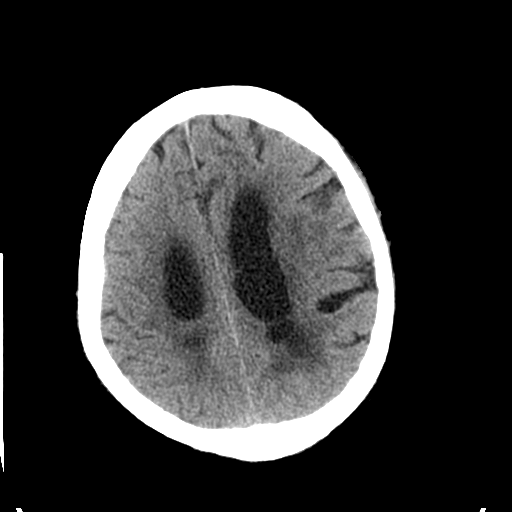
[im 26/34  brain]
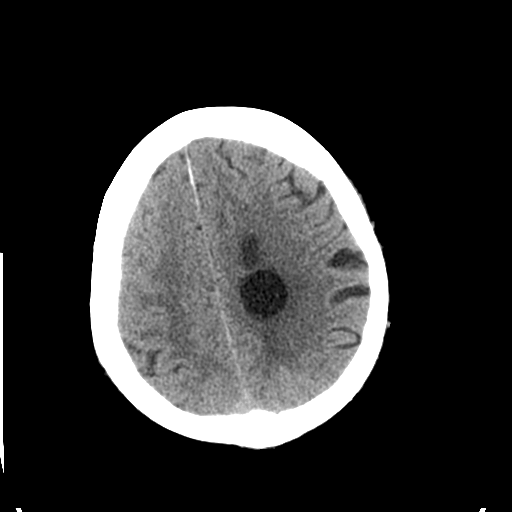
[im 29/34  brain]
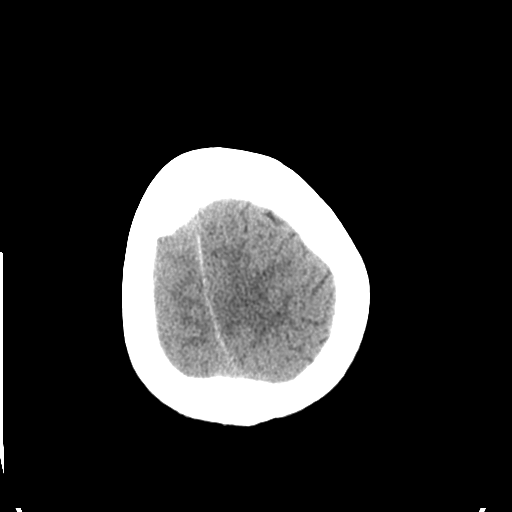
[im 31/34  brain]
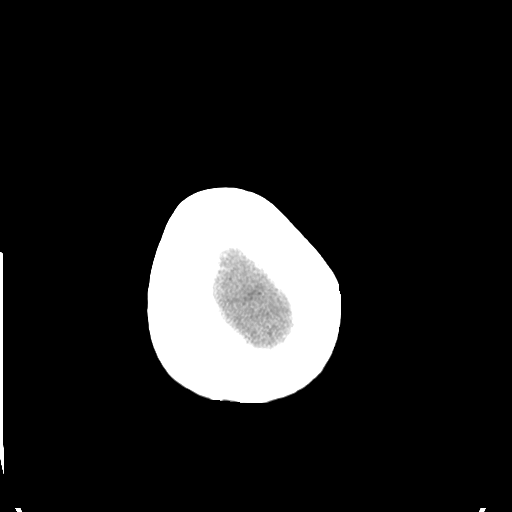
[im 31/34  bone]
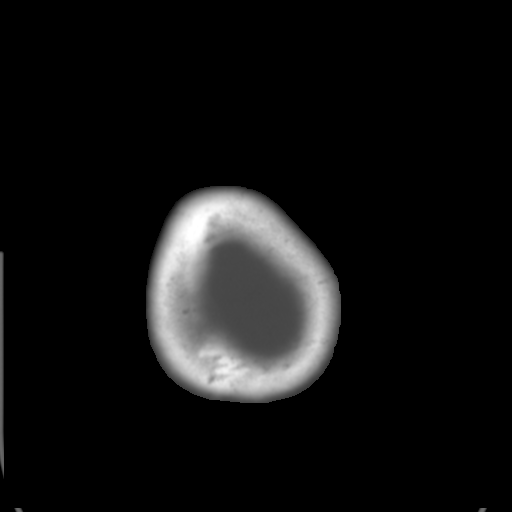

[Series 3: bone windows · axial · 0.45mm/px · z∈[-121,-96]mm · 2 of 34 slices shown]
[im 3/34  bone]
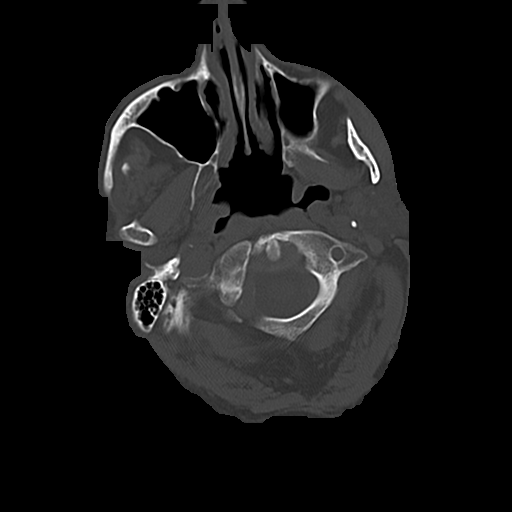
[im 8/34  bone]
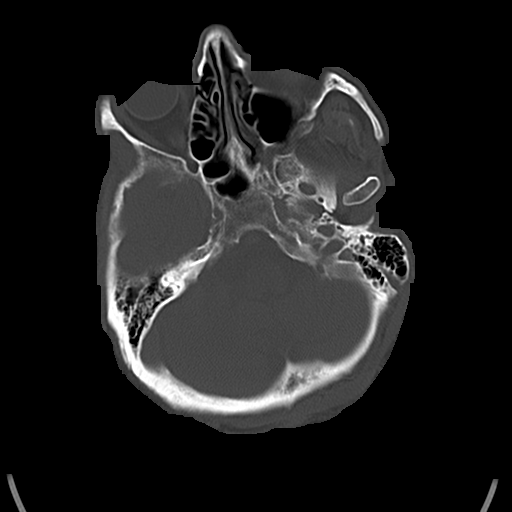

[15 of 30 positions shown; findings below may reference images not displayed]

FINDINGS: Diffuse prominence of the CSF containing spaces is compatible with
generalized age-related cerebral atrophy. Patchy and confluent
hypodensity within the periventricular and deep white matter both
cerebral hemispheres most consistent with chronic small vessel
ischemic disease. These changes are progressed relative to 0420.
Probable small remote lacunar infarct present within the basal
ganglia bilaterally.

There is no acute intracranial hemorrhage or infarct. No mass lesion
or midline shift. Gray-white matter differentiation is well
maintained. Ventricles are normal in size without evidence of
hydrocephalus. CSF containing spaces are within normal limits. No
extra-axial fluid collection.

The calvarium is intact.

Orbital soft tissues are within normal limits.

The paranasal sinuses and mastoid air cells are well pneumatized and
free of fluid.

Scalp soft tissues are unremarkable.
IMPRESSION: 1. No acute intracranial process.
2. Atrophy with moderate chronic microvascular ischemic disease,
advanced relative to 0420.

## 2016-04-08 ENCOUNTER — Other Ambulatory Visit: Payer: Self-pay | Admitting: *Deleted

## 2016-04-08 MED ORDER — HYDROCODONE-ACETAMINOPHEN 5-325 MG PO TABS: ORAL_TABLET | ORAL | 0 refills | Status: AC

## 2016-04-08 NOTE — Telephone Encounter (Signed)
[facility] [phone] Fax: [phone]  

## 2016-04-20 ENCOUNTER — Encounter: Payer: Self-pay | Admitting: Family

## 2016-04-20 ENCOUNTER — Non-Acute Institutional Stay (SKILLED_NURSING_FACILITY): Payer: Medicare Other | Admitting: Family

## 2016-04-20 DIAGNOSIS — E782 Mixed hyperlipidemia: Secondary | ICD-10-CM

## 2016-04-20 DIAGNOSIS — I5032 Chronic diastolic (congestive) heart failure: Secondary | ICD-10-CM

## 2016-04-20 DIAGNOSIS — I110 Hypertensive heart disease with heart failure: Secondary | ICD-10-CM | POA: Diagnosis not present

## 2016-04-20 DIAGNOSIS — N400 Enlarged prostate without lower urinary tract symptoms: Secondary | ICD-10-CM | POA: Diagnosis not present

## 2016-04-20 NOTE — Progress Notes (Signed)
Patient ID: [name], male   DOB: 04-13-1925, 82 y.o.   MRN: [id number]   Location:   [place] [place] Room Number: 1102-B Place of Service:  SNF [phone]) Provider:  Marlowe Sax, FNP-C   [name], MD  Patient Care Team: Tonia Ghent, MD as PCP - General  Extended Emergency Contact Information Primary Emergency Contact: [facility] Address: [address]          [place], [place] [address] [name] Phone: [phone] Mobile Phone: [phone] Relation: Spouse Secondary Emergency Contact: Lick,[name] Address: [address]          [place], [place] [address] Chase Litter of [place] Work Phone: [phone] Mobile Phone: [phone] Relation: Son  Code Status:  Full Code Goals of care: Advanced Directive information Advanced Directives 02/11/2016  Does patient have an advance directive? Yes  Type of Advance Directive (No Data)  Does patient want to make changes to advanced directive? -  Copy of advanced directive(s) in chart? -  Would patient like information on creating an advanced directive? -  Pre-existing out of facility DNR order (yellow form or pink MOST form) -     Chief Complaint  Patient presents with  . Medical Management of Chronic Issues    Follow up    HPI:  Pt is a 82 y.o. male seen today at [facility]  for medical management of chronic diseases.He has a medical history of HTN, CHF, Hyperlipidemia, Afib, TIA, BPH, CKD stage 3 and among other conditions. He is seen in his room today. He denies any acute issues this visit. Facility Nurse reports no new concerns. No recent fall episodes, weight changes or hospital admission. No skin breakdown.      Past Medical History:  Diagnosis Date  . B12 deficiency   . CKD (chronic kidney disease), stage II   . Colon polyps    hyperplastic  . Esophageal stricture   . GERD (gastroesophageal reflux disease)   . Hemorrhoids   . History of ETT  09/1991    POS   . History of MRI 04-22-06   L/S- right hnp  l5/si   . Hyperlipidemia   . Hypertension   . Lung nodule    Left lung, seen 2012, no change in 2012, no follow up needed   . Macular degeneration   . PNA (pneumonia) 2010   Bilateral Pneumonia, COPD 81mm LLL Nodule   . Pneumonia   . Stroke ([place])   . TIA (transient ischemic attack)   . Tobacco abuse    Past Surgical History:  Procedure Laterality Date  . CYSTECTOMY  1995   Lumbar area   . HIP PINNING,CANNULATED Left 03/28/2015   Procedure: CANNULATED HIP PINNING;  Surgeon: [name], MD;  Location: [facility];  Service: Orthopedics;  Laterality: Left;    No Known Allergies    Medication List       Accurate as of 04/20/16  2:58 PM. Always use your most recent med list.          albuterol 0.63 MG/3ML nebulizer solution Commonly known as:  ACCUNEB Take 1 ampule by nebulization every 6 (six) hours as needed for wheezing or shortness of breath.   aspirin 81 MG EC tablet Take 1 tablet (81 mg total) by mouth daily.   furosemide 20 MG tablet Commonly known as:  LASIX Take 1 tablet (20 mg total) by mouth daily.   HYDROcodone-acetaminophen 5-325 MG tablet Commonly  known as:  NORCO/VICODIN Take 1/2 tablet by mouth every 6 hours as needed for moderate pain. Do not exceed 3gm of Tylenol in 24 hours   hydroxypropyl methylcellulose / hypromellose 2.5 % ophthalmic solution Commonly known as:  ISOPTO TEARS / GONIOVISC Place 1 drop into both eyes 4 (four) times daily.   ipratropium-albuterol 0.5-2.5 (3) MG/3ML Soln Commonly known as:  DUONEB Take 3 mLs by nebulization 3 (three) times daily.   metoprolol tartrate 25 MG tablet Commonly known as:  LOPRESSOR Take 12.5 mg by mouth daily. Hold if  HR < 60 or SBP < 110   omeprazole 20 MG capsule Commonly known as:  PRILOSEC Take 20 mg by mouth daily.   oxybutynin 5 MG tablet Commonly known as:  DITROPAN Take 5 mg by mouth daily.   OXYGEN Inhale 2 L into the lungs.     PROCEL PO 2 scoops Procel by mouth twice daily.   simvastatin 20 MG tablet Commonly known as:  ZOCOR Take 1 tablet (20 mg total) by mouth daily.   tamsulosin 0.4 MG Caps capsule Commonly known as:  FLOMAX Take 1 capsule (0.4 mg total) by mouth daily.   traMADol 50 MG tablet Commonly known as:  ULTRAM Take 50 mg by mouth every 12 (twelve) hours as needed for moderate pain.   traZODone 25 mg Tabs tablet Commonly known as:  DESYREL Take 25 mg by mouth at bedtime. May repeat dose if not asleep in 1 - 2 hours   zinc oxide 11.3 % Crea cream Commonly known as:  BALMEX Apply 1 application topically 2 (two) times daily. Apply over Nizoral cream 2 times daily       Review of Systems  Constitutional: Negative for activity change, appetite change, chills, fatigue and fever.  HENT: Negative for congestion, sinus pressure, sneezing and sore throat.   Eyes: Negative for pain, discharge, redness, itching and visual disturbance.  Respiratory: Negative for cough, chest tightness, shortness of breath and wheezing.        Oxygen via nasal cannula   Cardiovascular: Negative for chest pain.  Gastrointestinal: Negative for abdominal distention, abdominal pain, constipation, diarrhea, nausea and vomiting.  Endocrine: Negative.   Genitourinary: Negative for dysuria, flank pain, frequency and urgency.  Musculoskeletal: Positive for gait problem. Negative for back pain.  Skin: Negative.   Neurological: Negative for dizziness, seizures, light-headedness and headaches.       Generalized weakness   Hematological: Does not bruise/bleed easily.  Psychiatric/Behavioral: Negative for agitation, confusion, hallucinations and sleep disturbance. The patient is not nervous/anxious.     Immunization History  Administered Date(s) Administered  . Influenza Split 05/31/2012  . Influenza Whole 05/28/2008, 06/18/2009, 04/26/2010  . Influenza,inj,Quad PF,36+ Mos 06/07/2013, 05/26/2014  . PPD Test 11/16/2015,  12/23/2015  . Pneumococcal Conjugate-13 12/01/2014  . Pneumococcal Polysaccharide-23 06/10/1998  . Td 06/10/1998, 05/31/2012  . Zoster 11/10/2010   Pertinent  Health Maintenance Due  Topic Date Due  . INFLUENZA VACCINE  04/20/2017 (Originally 03/29/2016)  . PNA vac Low Risk Adult  Completed   Fall Risk  12/01/2014  Falls in the past year? No    Vitals:   04/20/16 1433  BP: (!) 121/54  Pulse: 69  Resp: 20  Temp: 97.2 F (36.2 C)  TempSrc: Oral  SpO2: 98%  Weight: 128 lb (58.1 kg)  Height: 5\' 5"  (1.651 m)   Body mass index is 21.3 kg/m. Physical Exam  Constitutional: He appears well-developed and well-nourished.  Elderly in no acute distress.  HENT:  Head: Normocephalic.  Mouth/Throat: Oropharynx is clear and moist.  Eyes: EOM are normal. Pupils are equal, round, and reactive to light. Right eye exhibits no discharge. Left eye exhibits no discharge. No scleral icterus.  Neck: Normal range of motion. No JVD present.  Cardiovascular: Normal rate, regular rhythm and intact distal pulses.  Exam reveals no friction rub.   No murmur heard. Pulmonary/Chest: Effort normal and breath sounds normal. No respiratory distress. He has no wheezes. He has no rales.  Abdominal: Soft. Bowel sounds are normal. He exhibits no distension. There is no tenderness. There is no rebound and no guarding.  Genitourinary:  Genitourinary Comments: Incontinent   Musculoskeletal: He exhibits no edema or tenderness.  Unsteady gait. lower extremities decreased strength.   Lymphadenopathy:    He has no cervical adenopathy.  Neurological: He is alert.  Skin: Skin is warm and dry. No rash noted. No erythema. No pallor.  Psychiatric: He has a normal mood and affect.    Labs reviewed:  Recent Labs  11/16/15 0457  12/21/15 0355 12/22/15 0231  12/23/15 0543 12/29/15 02/12/16 03/24/16  NA 138  < > 130* 132*  < > 137 139 133* 135*  K 3.9  < > 3.3* 3.9  --  4.0 4.2 4.3 4.0  CL 96*  < > 86* 98*  --  102   --   --   --   CO2 32  < > 27 24  --  24  --   --   --   GLUCOSE 99  < > 116* 128*  --  108*  --   --   --   BUN 57*  < > 66* 57*  < > 49* 30* 20 19  CREATININE 1.82*  < > 2.20* 1.74*  < > 1.44* 1.0 1.0 0.9  CALCIUM 8.4*  < > 8.0* 7.9*  --  8.0*  --   --   --   MG 1.9  --   --  1.7  --  1.8  --   --   --   < > = values in this interval not displayed.  Recent Labs  12/21/15 0355 12/22/15 0231 12/23/15 0543 12/29/15 01/18/16  AST 57* 149* 116* 28 16  ALT 43 92* 112* 56* 18  ALKPHOS 107 103 105 100 105  BILITOT 0.8 1.0 0.7  --   --   PROT 6.9 5.6* 5.3*  --   --   ALBUMIN 2.3* 1.8* 1.6*  --   --     Recent Labs  12/17/15 1322 12/21/15 0355 12/22/15 0231  12/23/15 0543  01/04/16 02/12/16 03/24/16  WBC 9.3 19.0* 10.6*  < > 14.4*  < > 7.9 9.5 9.4  NEUTROABS 6.8 16.9*  --   --  13.2*  --   --   --   --   HGB 12.1* 11.9* 9.3*  --  8.6*  < > 9.4* 10.8* 10.9*  HCT 37.0* 35.6* 28.1*  --  26.3*  < > 29* 35* 34*  MCV 80.6 78.6 78.3  --  78.3  --   --   --   --   PLT 324.0 264 192  --  237  < > 460* 363 355  < > = values in this interval not displayed. Lab Results  Component Value Date   TSH 6.34 (A) 03/24/2016   Lab Results  Component Value Date   HGBA1C 5.5 02/12/2016   Lab Results  Component Value Date  CHOL 123 01/18/2016   HDL 31 (A) 01/18/2016   LDLCALC 75 01/18/2016   TRIG 86 01/18/2016   CHOLHDL 4.9 08/12/2015   Assessment/Plan HTN B/p stable. Continue on Metoprolol. Monitor BMP   CHF No recent weight changes. Exam findings negative for edema, shortness of breath or wheezing. Continue on  Metoprolol and Furosemide.   Hyperlipidemia  Continue on simvastatin. Monitor lipid panel periodically.   BPH  Continue on oxybutynin.    Family/ staff Communication: Reviewed plan of care with patient and facility Nurse supervisor.  Labs/tests ordered: None

## 2016-04-27 ENCOUNTER — Non-Acute Institutional Stay (SKILLED_NURSING_FACILITY): Payer: Medicare Other | Admitting: Family

## 2016-04-27 DIAGNOSIS — R059 Cough, unspecified: Secondary | ICD-10-CM

## 2016-04-27 DIAGNOSIS — R638 Other symptoms and signs concerning food and fluid intake: Secondary | ICD-10-CM | POA: Diagnosis not present

## 2016-04-27 DIAGNOSIS — R05 Cough: Secondary | ICD-10-CM

## 2016-04-27 DIAGNOSIS — F05 Delirium due to known physiological condition: Secondary | ICD-10-CM

## 2016-04-27 NOTE — Progress Notes (Signed)
Location:   [place] Room Number: [place] of Service:  SNF [phone]) Provider: Eshan Trupiano FNP-C   [name], MD  Patient Care Team: Tonia Ghent, MD as PCP - General  Extended Emergency Contact Information Primary Emergency Contact: [facility] Address: [address]          [place], [place] [address] Johnnette Litter of [place] Phone: [phone] Mobile Phone: [phone] Relation: Spouse Secondary Emergency Contact: Trimble,[name] Address: [address]          [place], [place] [address] Johnnette Litter of [place] Work Phone: [phone] Mobile Phone: [phone] Relation: Son  Code Status:  Full Code   Goals of care: Advanced Directive information Advanced Directives 02/11/2016  Does patient have an advance directive? Yes  Type of Advance Directive (No Data)  Does patient want to make changes to advanced directive? -  Copy of advanced directive(s) in chart? -  Would patient like information on creating an advanced directive? -  Pre-existing out of facility DNR order (yellow form or pink MOST form) -     Chief Complaint  Patient presents with  . Acute Visit    confused, cough, decreased oral intake    HPI:  Pt is a 82 y.o. male seen today at [facility] for an acute visit for evaluation of increased confusion and cough. He is seen in his room today per facility Nurse request. Facility Nurse reports patient has had increased confusion and easily irritable. He has been combative this morning with facility staff. He has been eating less but refused all medication this morning. Facility Nurse also notes patient has been sleeping most of the day. He has been coughing up white-yellow thick phlegm.He also feels warm to touch but no fever noted by Nurse. Patient unable to provide HPI and ROS due to mental status change.    Past Medical History:  Diagnosis Date  . B12 deficiency   . CKD (chronic kidney  disease), stage II   . Colon polyps    hyperplastic  . Esophageal stricture   . GERD (gastroesophageal reflux disease)   . Hemorrhoids   . History of ETT 09/1991    POS   . History of MRI 04-22-06   L/S- right hnp  l5/si   . Hyperlipidemia   . Hypertension   . Lung nodule    Left lung, seen 2012, no change in 2012, no follow up needed   . Macular degeneration   . PNA (pneumonia) 2010   Bilateral Pneumonia, COPD 49mm LLL Nodule   . Pneumonia   . Stroke ([place])   . TIA (transient ischemic attack)   . Tobacco abuse    Past Surgical History:  Procedure Laterality Date  . CYSTECTOMY  1995   Lumbar area   . HIP PINNING,CANNULATED Left 03/28/2015   Procedure: CANNULATED HIP PINNING;  Surgeon: [name], MD;  Location: [facility];  Service: Orthopedics;  Laterality: Left;    No Known Allergies    Medication List       Accurate as of 04/27/16  1:13 PM. Always use your most recent med list.          albuterol 0.63 MG/3ML nebulizer solution Commonly known as:  ACCUNEB Take 1 ampule by nebulization every 6 (six) hours as needed for wheezing or shortness of breath.   aspirin 81 MG EC tablet Take 1 tablet (81 mg total) by mouth daily.  furosemide 20 MG tablet Commonly known as:  LASIX Take 1 tablet (20 mg total) by mouth daily.   HYDROcodone-acetaminophen 5-325 MG tablet Commonly known as:  NORCO/VICODIN Take 1/2 tablet by mouth every 6 hours as needed for moderate pain. Do not exceed 3gm of Tylenol in 24 hours   hydroxypropyl methylcellulose / hypromellose 2.5 % ophthalmic solution Commonly known as:  ISOPTO TEARS / GONIOVISC Place 1 drop into both eyes 4 (four) times daily.   ipratropium-albuterol 0.5-2.5 (3) MG/3ML Soln Commonly known as:  DUONEB Take 3 mLs by nebulization 3 (three) times daily.   metoprolol tartrate 25 MG tablet Commonly known as:  LOPRESSOR Take 12.5 mg by mouth daily. Hold if  HR < 60 or SBP < 110   omeprazole 20 MG capsule Commonly known as:   PRILOSEC Take 20 mg by mouth daily.   oxybutynin 5 MG tablet Commonly known as:  DITROPAN Take 5 mg by mouth daily.   OXYGEN Inhale 2 L into the lungs.   PROCEL PO 2 scoops Procel by mouth twice daily.   simvastatin 20 MG tablet Commonly known as:  ZOCOR Take 1 tablet (20 mg total) by mouth daily.   tamsulosin 0.4 MG Caps capsule Commonly known as:  FLOMAX Take 1 capsule (0.4 mg total) by mouth daily.   traMADol 50 MG tablet Commonly known as:  ULTRAM Take 50 mg by mouth every 12 (twelve) hours as needed for moderate pain.   traZODone 25 mg Tabs tablet Commonly known as:  DESYREL Take 25 mg by mouth at bedtime. May repeat dose if not asleep in 1 - 2 hours   zinc oxide 11.3 % Crea cream Commonly known as:  BALMEX Apply 1 application topically 2 (two) times daily. Apply over Nizoral cream 2 times daily       Review of Systems  Unable to perform ROS: Mental status change  Respiratory:       Oxygen via nasal cannula   Neurological:       Generalized weakness     Immunization History  Administered Date(s) Administered  . Influenza Split 05/31/2012  . Influenza Whole 05/28/2008, 06/18/2009, 04/26/2010  . Influenza,inj,Quad PF,36+ Mos 06/07/2013, 05/26/2014  . PPD Test 11/16/2015, 12/23/2015  . Pneumococcal Conjugate-13 12/01/2014  . Pneumococcal Polysaccharide-23 06/10/1998  . Td 06/10/1998, 05/31/2012  . Zoster 11/10/2010   Pertinent  Health Maintenance Due  Topic Date Due  . INFLUENZA VACCINE  04/20/2017 (Originally 03/29/2016)  . PNA vac Low Risk Adult  Completed   Fall Risk  12/01/2014  Falls in the past year? No      Vitals:   04/27/16 1030  BP: 120/62  Pulse: 68  Resp: 20  Temp: 97.5 F (36.4 C)  SpO2: 98%  Weight: 121 lb 9.6 oz (55.2 kg)  Height: 5\' 5"  (1.651 m)   Body mass index is 20.24 kg/m. Physical Exam  Constitutional: He appears well-developed and well-nourished.  Elderly confused   HENT:  Head: Normocephalic.  Mouth/Throat:  Oropharynx is clear and moist.  Eyes: EOM are normal. Pupils are equal, round, and reactive to light. No scleral icterus.  Bilateral lower eyelid turned outward.  Neck: Normal range of motion. No JVD present.  Cardiovascular: Normal rate, regular rhythm and intact distal pulses.  Exam reveals no friction rub.   No murmur heard. Pulmonary/Chest: Effort normal. No respiratory distress. He has no wheezes. He has no rales.  Diminished bases though not able to follow commands to deep breath.   Abdominal: Soft.  Bowel sounds are normal. He exhibits no distension. There is no tenderness. There is no rebound and no guarding.  Musculoskeletal: He exhibits no edema or tenderness.  Unsteady gait.bilateral lower extremities decreased strength.   Lymphadenopathy:    He has no cervical adenopathy.  Neurological: He is alert.  Speech Mumble   Skin: Skin is warm and dry. No rash noted. No erythema. No pallor.  Psychiatric:  Confused     Labs reviewed:  Recent Labs  11/16/15 0457  12/21/15 0355 12/22/15 0231  12/23/15 0543 12/29/15 02/12/16 03/24/16  NA 138  < > 130* 132*  < > 137 139 133* 135*  K 3.9  < > 3.3* 3.9  --  4.0 4.2 4.3 4.0  CL 96*  < > 86* 98*  --  102  --   --   --   CO2 32  < > 27 24  --  24  --   --   --   GLUCOSE 99  < > 116* 128*  --  108*  --   --   --   BUN 57*  < > 66* 57*  < > 49* 30* 20 19  CREATININE 1.82*  < > 2.20* 1.74*  < > 1.44* 1.0 1.0 0.9  CALCIUM 8.4*  < > 8.0* 7.9*  --  8.0*  --   --   --   MG 1.9  --   --  1.7  --  1.8  --   --   --   < > = values in this interval not displayed.  Recent Labs  12/21/15 0355 12/22/15 0231 12/23/15 0543 12/29/15 01/18/16  AST 57* 149* 116* 28 16  ALT 43 92* 112* 56* 18  ALKPHOS 107 103 105 100 105  BILITOT 0.8 1.0 0.7  --   --   PROT 6.9 5.6* 5.3*  --   --   ALBUMIN 2.3* 1.8* 1.6*  --   --     Recent Labs  12/17/15 1322 12/21/15 0355 12/22/15 0231  12/23/15 0543  01/04/16 02/12/16 03/24/16  WBC 9.3 19.0* 10.6*  <  > 14.4*  < > 7.9 9.5 9.4  NEUTROABS 6.8 16.9*  --   --  13.2*  --   --   --   --   HGB 12.1* 11.9* 9.3*  --  8.6*  < > 9.4* 10.8* 10.9*  HCT 37.0* 35.6* 28.1*  --  26.3*  < > 29* 35* 34*  MCV 80.6 78.6 78.3  --  78.3  --   --   --   --   PLT 324.0 264 192  --  237  < > 460* 363 355  < > = values in this interval not displayed. Lab Results  Component Value Date   TSH 6.34 (A) 03/24/2016   Lab Results  Component Value Date   HGBA1C 5.5 02/12/2016   Lab Results  Component Value Date   CHOL 123 01/18/2016   HDL 31 (A) 01/18/2016   LDLCALC 75 01/18/2016   TRIG 86 01/18/2016   CHOLHDL 4.9 08/12/2015    Assessment/Plan 1. Acute confusional state Increased confusion, easily irritable and combative towards facility staff. Calm during visit but speech mumble unable to provide history this is new to patient. Will Obtain Stat CBC/diff, BMP now. Urine specimen for U/A and C/S. Check Vital signs Q shift X 3 days.   2. Cough Afebrile.Exam findings diminished bases to Auscultate though not able to follow commands to  take a deep breath. Will obtain a stat portable CXR Pa/Lat rule out pneumonia.   3. Decreased oral intake  Continue to assist with meals. Encourage fluid intake.       Family/ staff Communication: Reviewed plan of care with facility Nurse and Nurse supervisor   Labs/tests ordered: Stat CBC/diff, BMP now.Stat portable CXR Pa/Lat rule out pneumonia.

## 2016-05-12 ENCOUNTER — Telehealth: Payer: Self-pay | Admitting: Family Medicine

## 2016-05-12 NOTE — Telephone Encounter (Signed)
Called and LMOVM x2 for pt's son [name].  Didn't talk with family.  Offered condolences.  No confidential info left on the message.  It was a privilege to see this kind gentleman in the clinic.

## 2016-05-13 NOTE — Telephone Encounter (Signed)
Talked to pt's son [name].  Condolences offered.  He thanked me for the call.

## 2016-05-29 DEATH — deceased

## 2016-11-21 IMAGING — RF DG HIP (WITH PELVIS) OPERATIVE*L*
1 series · 3 of 3 positions shown · non-contrast
Comparison: 03/27/2015

CLINICAL DATA: Internal fixation left hip fracture.

EXAM:
OPERATIVE left HIP (WITH PELVIS IF PERFORMED) 3 VIEWS
Fluoroscopy time 0 minutes 7.4 seconds.
TECHNIQUE: Fluoroscopic spot image(s) were submitted for interpretation
post-operatively.

[Series 1: run · 3 of 3 slices shown]
[im 1/3]
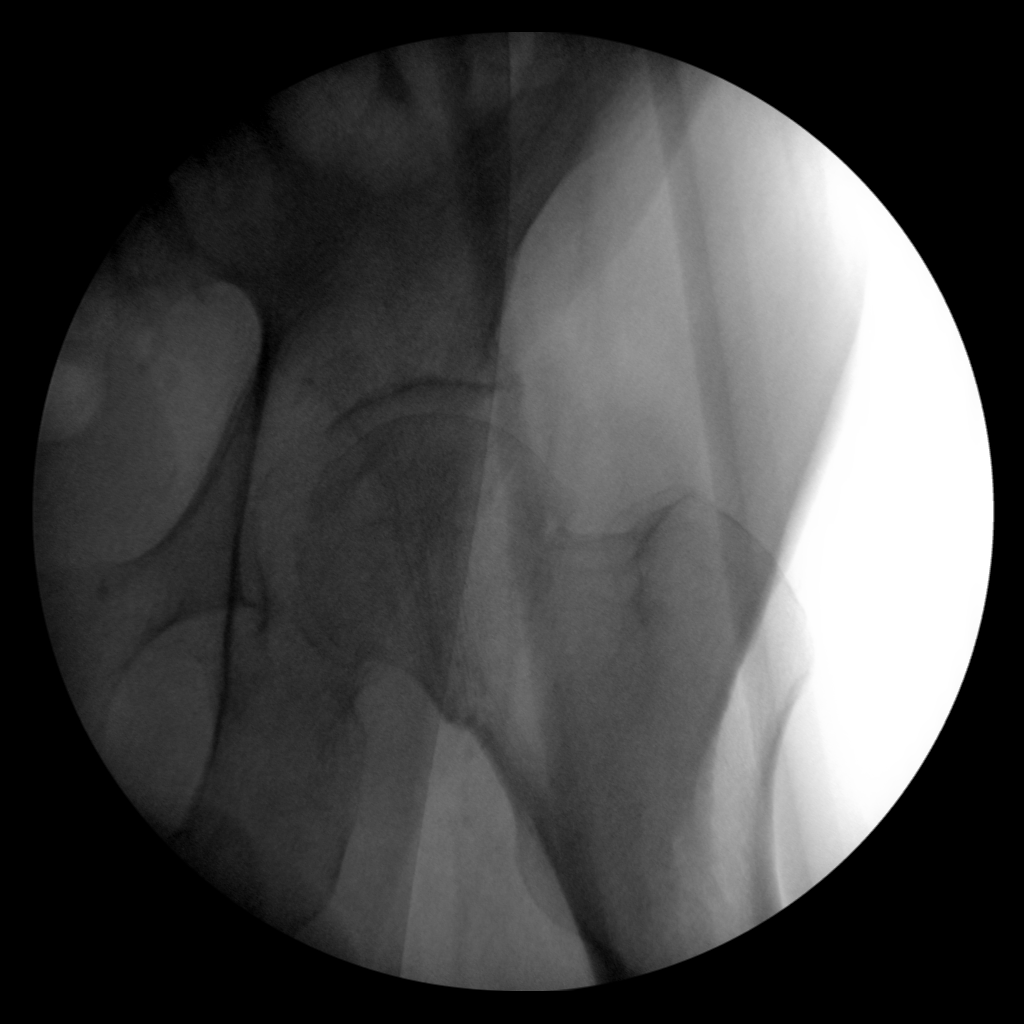
[im 2/3]
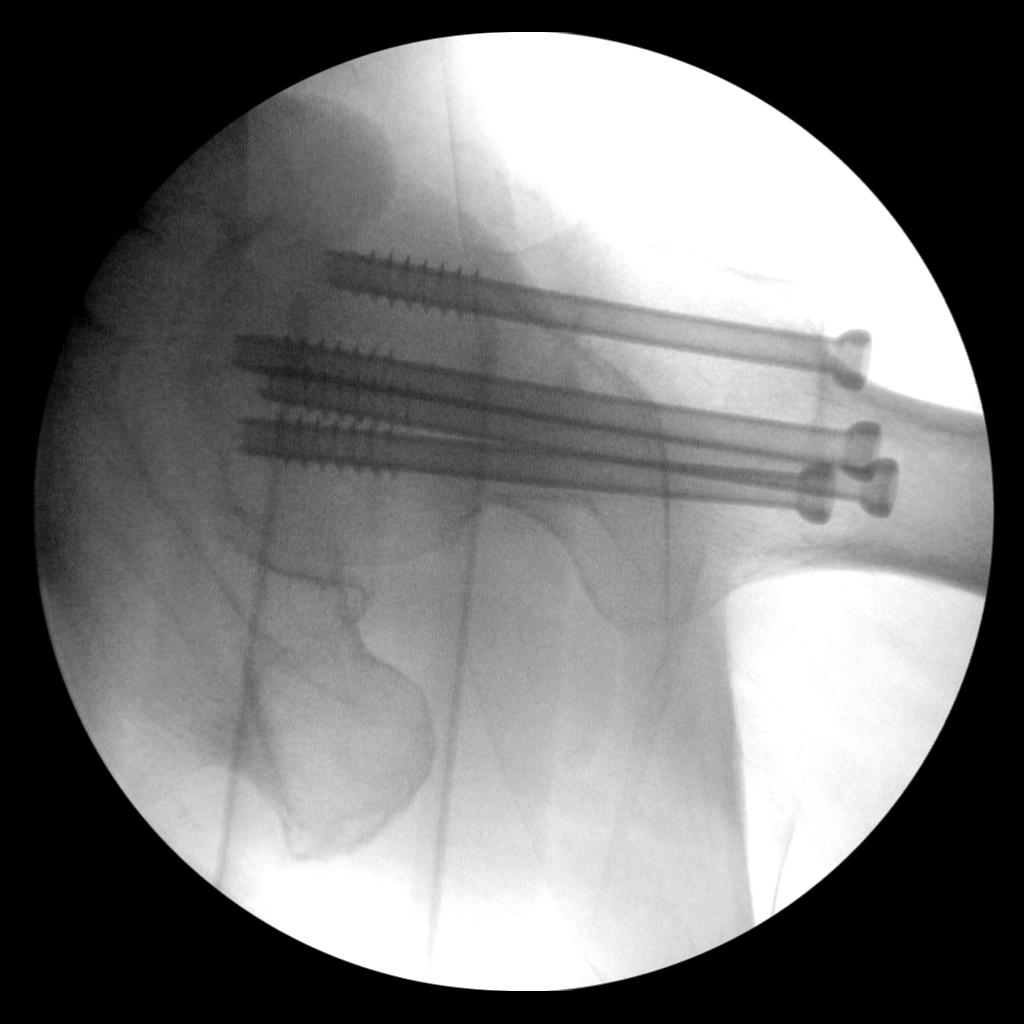
[im 3/3]
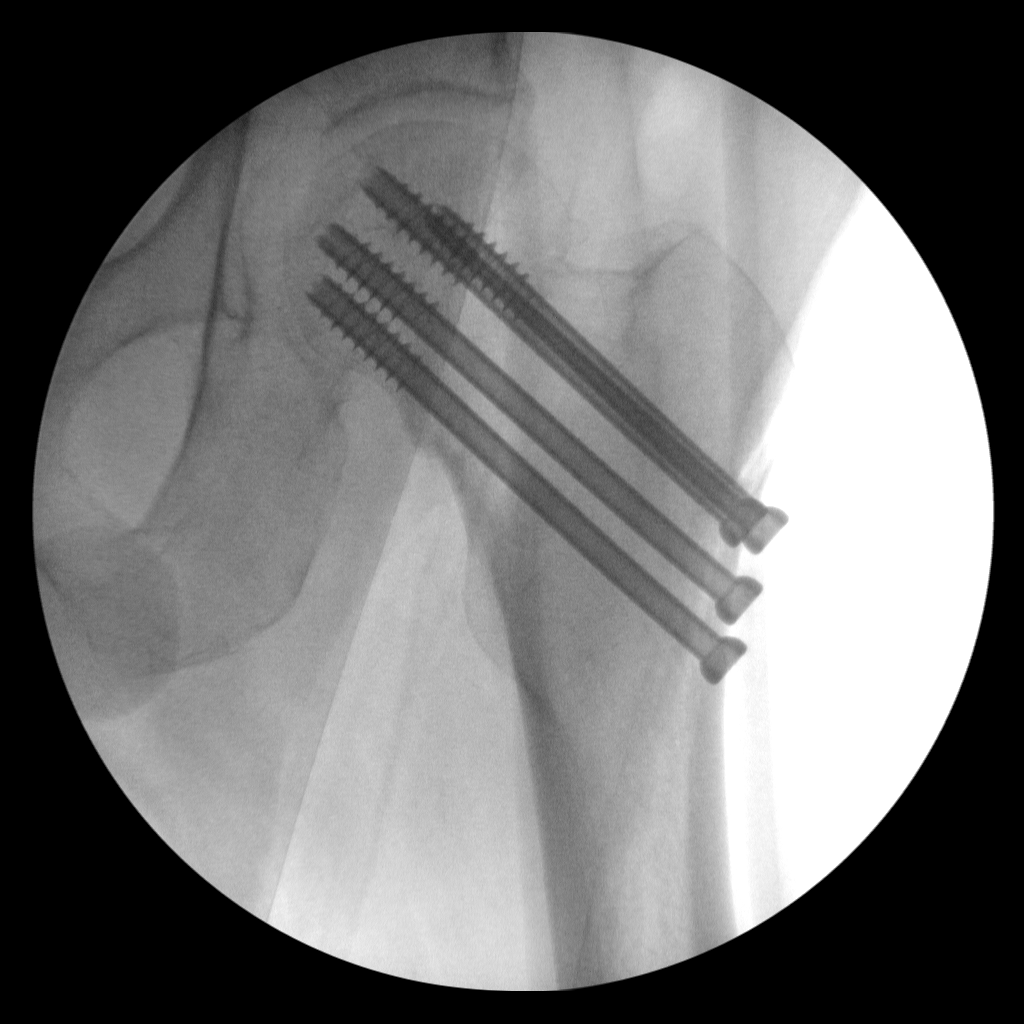

[3 of 3 positions shown; findings below may reference images not displayed]

FINDINGS: Examination demonstrates placement of 4 orthopedic screws from
inferior to the superior bridging patient's femoral neck fracture
extending into the femoral head as hardware is intact. There is
anatomic alignment about the fracture site. Recommend correlation
with findings at the time of the procedure.
IMPRESSION: Internal fixation of left femoral neck fracture with hardware
intact.
